# Patient Record
Sex: Female | Born: 1960
Health system: Southern US, Community
[De-identification: ages and names within clinical notes are randomized; demographics above are authoritative.]

## PROBLEM LIST (undated history)

## (undated) DIAGNOSIS — F419 Anxiety disorder, unspecified: Secondary | ICD-10-CM

## (undated) DIAGNOSIS — D649 Anemia, unspecified: Secondary | ICD-10-CM

## (undated) DIAGNOSIS — G479 Sleep disorder, unspecified: Secondary | ICD-10-CM

## (undated) DIAGNOSIS — J449 Chronic obstructive pulmonary disease, unspecified: Secondary | ICD-10-CM

## (undated) DIAGNOSIS — C50412 Malignant neoplasm of upper-outer quadrant of left female breast: Secondary | ICD-10-CM

## (undated) DIAGNOSIS — C801 Malignant (primary) neoplasm, unspecified: Secondary | ICD-10-CM

## (undated) DIAGNOSIS — F32A Depression, unspecified: Secondary | ICD-10-CM

## (undated) DIAGNOSIS — Z87440 Personal history of urinary (tract) infections: Secondary | ICD-10-CM

## (undated) DIAGNOSIS — Z720 Tobacco use: Secondary | ICD-10-CM

## (undated) DIAGNOSIS — J40 Bronchitis, not specified as acute or chronic: Secondary | ICD-10-CM

## (undated) DIAGNOSIS — J45909 Unspecified asthma, uncomplicated: Secondary | ICD-10-CM

## (undated) DIAGNOSIS — M069 Rheumatoid arthritis, unspecified: Secondary | ICD-10-CM

## (undated) DIAGNOSIS — Z170 Estrogen receptor positive status [ER+]: Secondary | ICD-10-CM

## (undated) DIAGNOSIS — F329 Major depressive disorder, single episode, unspecified: Secondary | ICD-10-CM

## (undated) HISTORY — DX: Estrogen receptor positive status (ER+): Z17.0

## (undated) HISTORY — PX: MULTIPLE TOOTH EXTRACTIONS: SHX2053

## (undated) HISTORY — PX: JOINT REPLACEMENT: SHX530

## (undated) HISTORY — PX: HIP ARTHROPLASTY: SHX981

## (undated) HISTORY — DX: Malignant neoplasm of upper-outer quadrant of left female breast: C50.412

## (undated) HISTORY — PX: WISDOM TOOTH EXTRACTION: SHX21

---

## 2001-02-04 ENCOUNTER — Emergency Department (HOSPITAL_COMMUNITY): Admission: EM | Admit: 2001-02-04 | Discharge: 2001-02-04 | Payer: Self-pay | Admitting: Emergency Medicine

## 2011-05-01 ENCOUNTER — Other Ambulatory Visit (HOSPITAL_COMMUNITY): Payer: Self-pay | Admitting: Orthopedic Surgery

## 2011-05-01 DIAGNOSIS — M25561 Pain in right knee: Secondary | ICD-10-CM

## 2011-05-07 ENCOUNTER — Other Ambulatory Visit (HOSPITAL_COMMUNITY): Payer: Self-pay

## 2011-05-13 ENCOUNTER — Inpatient Hospital Stay (HOSPITAL_COMMUNITY): Admission: RE | Admit: 2011-05-13 | Payer: Self-pay | Source: Ambulatory Visit

## 2018-01-13 ENCOUNTER — Other Ambulatory Visit (INDEPENDENT_AMBULATORY_CARE_PROVIDER_SITE_OTHER): Payer: Self-pay | Admitting: Orthopedic Surgery

## 2018-01-14 ENCOUNTER — Other Ambulatory Visit: Payer: Self-pay | Admitting: Orthopedic Surgery

## 2018-01-15 ENCOUNTER — Encounter: Payer: Self-pay | Admitting: Internal Medicine

## 2018-01-15 ENCOUNTER — Ambulatory Visit: Payer: Medicaid Other | Attending: Internal Medicine | Admitting: Internal Medicine

## 2018-01-15 VITALS — BP 112/72 | HR 76 | Temp 97.9°F | Resp 16 | Ht 65.5 in | Wt 103.2 lb

## 2018-01-15 DIAGNOSIS — Z1239 Encounter for other screening for malignant neoplasm of breast: Secondary | ICD-10-CM

## 2018-01-15 DIAGNOSIS — Z1159 Encounter for screening for other viral diseases: Secondary | ICD-10-CM | POA: Diagnosis not present

## 2018-01-15 DIAGNOSIS — Z1331 Encounter for screening for depression: Secondary | ICD-10-CM | POA: Diagnosis not present

## 2018-01-15 DIAGNOSIS — Z803 Family history of malignant neoplasm of breast: Secondary | ICD-10-CM | POA: Diagnosis not present

## 2018-01-15 DIAGNOSIS — F17200 Nicotine dependence, unspecified, uncomplicated: Secondary | ICD-10-CM | POA: Insufficient documentation

## 2018-01-15 DIAGNOSIS — Z7952 Long term (current) use of systemic steroids: Secondary | ICD-10-CM | POA: Diagnosis not present

## 2018-01-15 DIAGNOSIS — M069 Rheumatoid arthritis, unspecified: Secondary | ICD-10-CM | POA: Diagnosis present

## 2018-01-15 DIAGNOSIS — Z23 Encounter for immunization: Secondary | ICD-10-CM | POA: Insufficient documentation

## 2018-01-15 DIAGNOSIS — Z5900 Homelessness unspecified: Secondary | ICD-10-CM | POA: Insufficient documentation

## 2018-01-15 DIAGNOSIS — Z79899 Other long term (current) drug therapy: Secondary | ICD-10-CM | POA: Diagnosis not present

## 2018-01-15 DIAGNOSIS — Z114 Encounter for screening for human immunodeficiency virus [HIV]: Secondary | ICD-10-CM | POA: Insufficient documentation

## 2018-01-15 DIAGNOSIS — F17210 Nicotine dependence, cigarettes, uncomplicated: Secondary | ICD-10-CM | POA: Insufficient documentation

## 2018-01-15 DIAGNOSIS — M1612 Unilateral primary osteoarthritis, left hip: Secondary | ICD-10-CM | POA: Diagnosis not present

## 2018-01-15 DIAGNOSIS — Z79891 Long term (current) use of opiate analgesic: Secondary | ICD-10-CM | POA: Insufficient documentation

## 2018-01-15 MED ORDER — NICOTINE POLACRILEX 2 MG MT GUM: 2.0000 mg | CHEWING_GUM | OROMUCOSAL | 1 refills | Status: DC | PRN

## 2018-01-15 MED ORDER — ALBUTEROL SULFATE HFA 108 (90 BASE) MCG/ACT IN AERS: 2.0000 | INHALATION_SPRAY | Freq: Four times a day (QID) | RESPIRATORY_TRACT | 2 refills | Status: DC | PRN

## 2018-01-15 MED ORDER — TRAMADOL HCL 50 MG PO TABS: 50.0000 mg | ORAL_TABLET | Freq: Two times a day (BID) | ORAL | 0 refills | Status: DC | PRN

## 2018-01-15 NOTE — Patient Instructions (Addendum)
Please contact the [facility] to schedule mammogram at [phone]   Influenza Virus Vaccine injection (Fluarix) What is this medicine? INFLUENZA VIRUS VACCINE (in floo EN zuh VAHY ruhs vak SEEN) helps to reduce the risk of getting influenza also known as the flu. This medicine may be used for other purposes; ask your health care provider or pharmacist if you have questions. COMMON BRAND NAME(S): Fluarix, Fluzone What should I tell my health care provider before I take this medicine? They need to know if you have any of these conditions: -bleeding disorder like hemophilia -fever or infection -Guillain-Barre syndrome or other neurological problems -immune system problems -infection with the human immunodeficiency virus (HIV) or AIDS -low blood platelet counts -multiple sclerosis -an unusual or allergic reaction to influenza virus vaccine, eggs, chicken proteins, latex, gentamicin, other medicines, foods, dyes or preservatives -pregnant or trying to get pregnant -breast-feeding How should I use this medicine? This vaccine is for injection into a muscle. It is given by a health care professional. A copy of Vaccine Information Statements will be given before each vaccination. Read this sheet carefully each time. The sheet may change frequently. Talk to your pediatrician regarding the use of this medicine in children. Special care may be needed. Overdosage: If you think you have taken too much of this medicine contact a poison control center or emergency room at once. NOTE: This medicine is only for you. Do not share this medicine with others. What if I miss a dose? This does not apply. What may interact with this medicine? -chemotherapy or radiation therapy -medicines that lower your immune system like etanercept, anakinra, infliximab, and adalimumab -medicines that treat or prevent blood clots like warfarin -phenytoin -steroid medicines like prednisone or  cortisone -theophylline -vaccines This list may not describe all possible interactions. Give your health care provider a list of all the medicines, herbs, non-prescription drugs, or dietary supplements you use. Also tell them if you smoke, drink alcohol, or use illegal drugs. Some items may interact with your medicine. What should I watch for while using this medicine? Report any side effects that do not go away within 3 days to your doctor or health care professional. Call your health care provider if any unusual symptoms occur within 6 weeks of receiving this vaccine. You may still catch the flu, but the illness is not usually as bad. You cannot get the flu from the vaccine. The vaccine will not protect against colds or other illnesses that may cause fever. The vaccine is needed every year. What side effects may I notice from receiving this medicine? Side effects that you should report to your doctor or health care professional as soon as possible: -allergic reactions like skin rash, itching or hives, swelling of the face, lips, or tongue Side effects that usually do not require medical attention (report to your doctor or health care professional if they continue or are bothersome): -fever -headache -muscle aches and pains -pain, tenderness, redness, or swelling at site where injected -weak or tired This list may not describe all possible side effects. Call your doctor for medical advice about side effects. You may report side effects to FDA at 1-800-FDA-1088. Where should I keep my medicine? This vaccine is only given in a clinic, pharmacy, doctor's office, or other health care setting and will not be stored at home. NOTE: This sheet is a summary. It may not cover all possible information. If you have questions about this medicine, talk to your doctor, pharmacist, or  health care provider.  2018 Elsevier/Gold Standard (2007-09-22 09:30:40)

## 2018-01-15 NOTE — Progress Notes (Signed)
Patient ID: [name], female    DOB: 1961/02/15  MRN: [id number]  CC: New Patient (Initial Visit)   Subjective: [name] is a 57 y.o. female who presents for new patient visit. Her concerns today include:   Previous PCP was [name] at the [facility] [employer]).  Just received Medicaid and was assigned to this practice.  Pt gives hx of RA dx in 2017 through [facility].  She was seen there one time.  Since then her PCP at [facility] had been writing the prescriptions for methotrexate and prednisone.  She is on prednisone 1 mg daily.  She is on methotrexate with a dose that alternates from week to week but she does not recall her current dose.   Reports most of the pain is in her hands, left hip and right shoulder Requests referral to rheumatology.  Complains of significant pain in right hip.  She was seen by orthopedics [facility] sometime last month.  They in turn referred her to Dr. Mayer Camel with [facility].  Patient states she was told that she will need hip replacement and she is ready to move forward now that she has Medicaid.  She plans to schedule a follow-up appointment with him as soon as possible.  She was given a limited prescription for tramadol 50 mg #15 tabs which she has been using sparingly.  She is requesting a prescription for it.  She just started working temporarily at [employer]  4 days a wk and finds that her hip hurts even more.   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    Patient has positive depression screen and positive gad 7 today.  She attributes her depression to what is currently going on with her health.  She feels that things will get better once she has had her hip surgery.  She denies any homicidal or suicidal ideation.  She is not interested in being placed on any medication.  Smoke for long time. Trying to  quit. -smoked intermittently for 40 yrs.  Was able to quit for 4-5 yrs at one time. Highest amount was 1/2 pk a day.   -smokes more when she is depressed -now 1 pk last 2 wks.  Never tried anything to quit. Would like to try nicotine gum  HM:  Last MMG and pap were 3 yrs ago  Social history, family history and surgical history reviewed. 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  She denies any other street drug use at this time. She is homeless but currently staying with a friend.  Current Outpatient Medications on File Prior to Visit  Medication Sig Dispense Refill  . ibuprofen (ADVIL,MOTRIN) 800 MG tablet Take 800 mg by mouth every 8 (eight) hours as needed.    . methotrexate (RHEUMATREX) 2.5 MG tablet Take 2.5 mg by mouth. Caution:Chemotherapy. Protect from light.    . predniSONE (DELTASONE) 1 MG tablet Take 1 mg by mouth daily with breakfast.     No current facility-administered medications on file prior to visit.     Allergies no known allergies  Social History   Socioeconomic History  . Marital status: Single    Spouse name: Not on file  . Number of children: 1  . Years of education: Not on file  . Highest education level:  Not on file  Occupational History  . Not on file  Social Needs  . Financial resource strain: Not on file  . Food insecurity:    Worry: Not on file    Inability: Not on file  . Transportation needs:    Medical: Not on file    Non-medical: Not on file  Tobacco Use  . Smoking status: Current Every Day Smoker  . Smokeless tobacco: Never Used  Substance and Sexual Activity  . Alcohol use: Yes    Comment: occasionally  . Drug use: Not on file    Comment: last used in 2004  . Sexual activity: Not on file  Lifestyle  . Physical activity:    Days per week: Not on file    Minutes per session: Not on file  . Stress: Not on file  Relationships  . Social connections:    Talks on phone: Not on file    Gets  together: Not on file    Attends religious service: Not on file    Active member of club or organization: Not on file    Attends meetings of clubs or organizations: Not on file    Relationship status: Not on file  . Intimate partner violence:    Fear of current or ex partner: Not on file    Emotionally abused: Not on file    Physically abused: Not on file    Forced sexual activity: Not on file  Other Topics Concern  . Not on file  Social History Narrative  . Not on file    Family History  Problem Relation Age of Onset  . Breast cancer Mother      ROS: Review of Systems As stated above PHYSICAL EXAM: BP 112/72   Pulse 76   Temp 97.9 F (36.6 C) (Oral)   Resp 16   Ht 5' 5.5" (1.664 m)   Wt 103 lb 3.2 oz (46.8 kg)   SpO2 95%   BMI 16.91 kg/m   Physical Exam  General appearance - alert, well appearing, middle-aged Caucasian female and in no distress Mental status - normal mood, behavior, speech, dress, motor activity, and thought processes Eyes - pupils equal and reactive, extraocular eye movements intact Neck - supple, no significant adenopathy Chest - clear to auscultation, no wheezes, rales or rhonchi, symmetric air entry Heart - normal rate, regular rhythm, normal S1, S2, no murmurs, rubs, clicks or gallops Musculoskeletal -patient has one crutch with her and walks with it and of the left arm.  She has ulnar deviation of the fingers with enlargement of the MCP joints especially the first MCP joint on both hands Extremities -no lower extremity edema. Depression screen PHQ 2/9 01/15/2018  Decreased Interest 2  Down, Depressed, Hopeless 2  PHQ - 2 Score 4  Altered sleeping 3  Tired, decreased energy 3  Change in appetite 1  Feeling bad or failure about yourself  1  Trouble concentrating 3  Moving slowly or fidgety/restless 2  Suicidal thoughts 0  PHQ-9 Score 17   GAD 7 : Generalized Anxiety Score 01/15/2018  Nervous, Anxious, on Edge 2  Control/stop worrying 2    Worry too much - different things 2  Trouble relaxing 2  Restless 2  Easily annoyed or irritable 1  Afraid - awful might happen 1  Total GAD 7 Score 12     ASSESSMENT AND PLAN: 1. Rheumatoid arthritis involving multiple sites, unspecified rheumatoid factor presence ([place]) Advised patient to call me back with the  dose of methotrexate that she is currently taking. - CBC - Comprehensive metabolic panel - Lipid panel - Ambulatory referral to Rheumatology - Rheumatoid factor  2. Tobacco dependence Patient advised to quit smoking. Pt ready to give trail of quitting.  She is wanting to try the nicotine gum.  Prescription given. - nicotine polacrilex (NICORETTE) 2 MG gum; Take 1 each (2 mg total) by mouth as needed for smoking cessation. Max of no more than 30 pieces/24 hr  Dispense: 100 tablet; Refill: 1  3. Arthritis of left hip Keep follow-up appointment with [facility]. I am agreeable to putting her on tramadol to use as needed.  However, patient advised that I will need to get her records from her previous PCP and also records from [facility] her diagnoses. -Patient advised that tramadol is a controlled substance and as such we will need to do a controlled substance prescribing agreement.  I had my CMA go over that with her.  We will get a baseline urine drug screen today. NCCSRS reviewed and is appropriate.  4. Breast cancer screening - MM Digital Screening; Future  5. Need for hepatitis C screening test Patient agreeable to hepatitis C screening - Hepatitis C Antibody  6. Screening for HIV (human immunodeficiency virus) Patient agreeable to HIV screening - HIV Antibody (routine testing w rflx)  7. Need for influenza vaccination   8. Opioid use agreement exists - [id number] 11+Oxyco+Alc+Crt-Bund  9. Positive depression screening Patient does not feel that she needs to be on any medication at this time.  She also declines speaking with our LCSW or  accessing behavioral health services.  10. Homeless   Patient was given the opportunity to ask questions.  Patient verbalized understanding of the plan and was able to repeat key elements of the plan.   Orders Placed This Encounter  Procedures  . MM Digital Screening  . CBC  . Comprehensive metabolic panel  . Lipid panel  . HIV Antibody (routine testing w rflx)  . Hepatitis C Antibody  . [id number] 11+Oxyco+Alc+Crt-Bund  . Rheumatoid factor  . Ambulatory referral to Rheumatology     Requested Prescriptions   Signed Prescriptions Disp Refills  . nicotine polacrilex (NICORETTE) 2 MG gum 100 tablet 1    Sig: Take 1 each (2 mg total) by mouth as needed for smoking cessation. Max of no more than 30 pieces/24 hr  . traMADol (ULTRAM) 50 MG tablet 40 tablet 0    Sig: Take 1 tablet (50 mg total) by mouth 2 (two) times daily as needed. Prescription to last 1 month  . albuterol (PROVENTIL HFA;VENTOLIN HFA) 108 (90 Base) MCG/ACT inhaler 1 Inhaler 2    Sig: Inhale 2 puffs into the lungs every 6 (six) hours as needed for wheezing or shortness of breath.    Karle Plumber, MD, FACP

## 2018-01-15 NOTE — Progress Notes (Signed)
Pt states she has rheumatoid arthritis   Pt states she is taking Methotrexate

## 2018-01-16 LAB — CBC
Hematocrit: 38.3 % (ref 34.0–46.6)
Hemoglobin: 12.8 g/dL (ref 11.1–15.9)
MCH: 28.8 pg (ref 26.6–33.0)
MCHC: 33.4 g/dL (ref 31.5–35.7)
MCV: 86 fL (ref 79–97)
Platelets: 311 10*3/uL (ref 150–450)
RBC: 4.44 x10E6/uL (ref 3.77–5.28)
RDW: 14.2 % (ref 12.3–15.4)
WBC: 7 10*3/uL (ref 3.4–10.8)

## 2018-01-16 LAB — COMPREHENSIVE METABOLIC PANEL
ALK PHOS: 87 IU/L (ref 39–117)
ALT: 12 IU/L (ref 0–32)
AST: 12 IU/L (ref 0–40)
Albumin/Globulin Ratio: 1.7 (ref 1.2–2.2)
Albumin: 4.3 g/dL (ref 3.5–5.5)
BUN/Creatinine Ratio: 17 (ref 9–23)
BUN: 16 mg/dL (ref 6–24)
Bilirubin Total: 0.3 mg/dL (ref 0.0–1.2)
CO2: 26 mmol/L (ref 20–29)
CREATININE: 0.95 mg/dL (ref 0.57–1.00)
Calcium: 10.1 mg/dL (ref 8.7–10.2)
Chloride: 102 mmol/L (ref 96–106)
GFR, EST AFRICAN AMERICAN: 76 mL/min/{1.73_m2} (ref >59)
GFR, EST NON AFRICAN AMERICAN: 66 mL/min/{1.73_m2} (ref >59)
GLOBULIN, TOTAL: 2.5 g/dL (ref 1.5–4.5)
GLUCOSE: 81 mg/dL (ref 65–99)
POTASSIUM: 4.5 mmol/L (ref 3.5–5.2)
Sodium: 142 mmol/L (ref 134–144)
Total Protein: 6.8 g/dL (ref 6.0–8.5)

## 2018-01-16 LAB — LIPID PANEL
Chol/HDL Ratio: 2.1 ratio (ref 0.0–4.4)
Cholesterol, Total: 165 mg/dL (ref 100–199)
HDL: 77 mg/dL (ref >39)
LDL CALC: 67 mg/dL (ref 0–99)
TRIGLYCERIDES: 103 mg/dL (ref 0–149)
VLDL CHOLESTEROL CAL: 21 mg/dL (ref 5–40)

## 2018-01-16 LAB — RHEUMATOID FACTOR: Rheumatoid fact SerPl-aCnc: 102.1 IU/mL — ABNORMAL HIGH (ref 0.0–13.9)

## 2018-01-16 LAB — HEPATITIS C ANTIBODY: Hep C Virus Ab: <0.1 s/co ratio (ref 0.0–0.9)

## 2018-01-16 LAB — HIV ANTIBODY (ROUTINE TESTING W REFLEX): HIV Screen 4th Generation wRfx: NONREACTIVE

## 2018-01-20 LAB — DRUG SCREEN 764883 11+OXYCO+ALC+CRT-BUND
AMPHETAMINES, URINE: NEGATIVE ng/mL
BARBITURATE: NEGATIVE ng/mL
BENZODIAZ UR QL: NEGATIVE ng/mL
Cannabinoid Quant, Ur: NEGATIVE ng/mL
Cocaine (Metabolite): NEGATIVE ng/mL
Creatinine: 154 mg/dL (ref 20.0–300.0)
Ethanol: NEGATIVE %
Meperidine: NEGATIVE ng/mL
Methadone Screen, Urine: NEGATIVE ng/mL
OPIATE SCREEN URINE: NEGATIVE ng/mL
Oxycodone/Oxymorphone, Urine: NEGATIVE ng/mL
PH OF URINE: 5.8 (ref 4.5–8.9)
PHENCYCLIDINE: NEGATIVE ng/mL
Propoxyphene: NEGATIVE ng/mL

## 2018-01-20 LAB — TRAMADOL GC/MS, URINE
TRAMADOL GC/MS CONF: 2200 ng/mL
Tramadol: POSITIVE — AB

## 2018-01-26 ENCOUNTER — Telehealth: Payer: Self-pay | Admitting: Internal Medicine

## 2018-01-26 NOTE — Telephone Encounter (Signed)
Will forward to pcp

## 2018-01-26 NOTE — Telephone Encounter (Signed)
Patient called requesting   methotrexate (RHEUMATREX) 2.5 MG tablet  . Pt can't see the rheumatologist yet until she get previous notes .  Thank You

## 2018-01-27 NOTE — Telephone Encounter (Signed)
Contacted pt to get information for Dr. Wynetta Emery pt didn't answer lvm asking pt to give me a call with this information

## 2018-01-28 MED ORDER — METHOTREXATE 2.5 MG PO TABS: ORAL_TABLET | ORAL | 1 refills | Status: DC

## 2018-01-28 NOTE — Telephone Encounter (Signed)
Contacted [facility] and spoke to [place]  Pt is taking Methotrexate 2.5mg  tablet 6 tabs every Saturday. Per Loletha Grayer it was last filled on halloween

## 2018-01-28 NOTE — Telephone Encounter (Signed)
Patient called to give information however, says she does not have this information and would like for Nurse to contact [facility] to obtain prescription information at [phone]  Prescription placed by mary from the [facility] Prescription name: Methatriade

## 2018-01-28 NOTE — Telephone Encounter (Signed)
Tried contacting again to get information for Dr. Wynetta Emery pt didn't answer lvm asking pt to give me a call with this information

## 2018-02-08 ENCOUNTER — Other Ambulatory Visit: Payer: Self-pay

## 2018-02-08 ENCOUNTER — Encounter (HOSPITAL_COMMUNITY)
Admission: RE | Admit: 2018-02-08 | Discharge: 2018-02-08 | Disposition: A | Discharge status: Home / Self Care | Payer: Medicaid Other | Source: Ambulatory Visit | Attending: Orthopedic Surgery | Admitting: Orthopedic Surgery

## 2018-02-08 ENCOUNTER — Ambulatory Visit (HOSPITAL_COMMUNITY)
Admission: RE | Admit: 2018-02-08 | Discharge: 2018-02-08 | Disposition: A | Discharge status: Home / Self Care | Payer: Medicaid Other | Source: Ambulatory Visit | Attending: Orthopedic Surgery | Admitting: Orthopedic Surgery

## 2018-02-08 ENCOUNTER — Encounter (HOSPITAL_COMMUNITY): Payer: Self-pay

## 2018-02-08 DIAGNOSIS — Z01818 Encounter for other preprocedural examination: Secondary | ICD-10-CM | POA: Insufficient documentation

## 2018-02-08 HISTORY — DX: Personal history of urinary (tract) infections: Z87.440

## 2018-02-08 HISTORY — DX: Tobacco use: Z72.0

## 2018-02-08 HISTORY — DX: Depression, unspecified: F32.A

## 2018-02-08 HISTORY — DX: Rheumatoid arthritis, unspecified: M06.9

## 2018-02-08 HISTORY — DX: Anxiety disorder, unspecified: F41.9

## 2018-02-08 HISTORY — DX: Major depressive disorder, single episode, unspecified: F32.9

## 2018-02-08 HISTORY — DX: Sleep disorder, unspecified: G47.9

## 2018-02-08 HISTORY — DX: Bronchitis, not specified as acute or chronic: J40

## 2018-02-08 LAB — BASIC METABOLIC PANEL
Anion gap: 6 (ref 5–15)
BUN: 23 mg/dL — ABNORMAL HIGH (ref 6–20)
CO2: 27 mmol/L (ref 22–32)
Calcium: 9.6 mg/dL (ref 8.9–10.3)
Chloride: 107 mmol/L (ref 98–111)
Creatinine, Ser: 0.77 mg/dL (ref 0.44–1.00)
GFR calc Af Amer: >60 mL/min (ref >60)
GFR calc non Af Amer: >60 mL/min (ref >60)
Glucose, Bld: 93 mg/dL (ref 70–99)
Potassium: 5.7 mmol/L — ABNORMAL HIGH (ref 3.5–5.1)
Sodium: 140 mmol/L (ref 135–145)

## 2018-02-08 LAB — URINALYSIS, ROUTINE W REFLEX MICROSCOPIC
Bilirubin Urine: NEGATIVE
Glucose, UA: NEGATIVE mg/dL
Hgb urine dipstick: NEGATIVE
Ketones, ur: NEGATIVE mg/dL
Leukocytes, UA: NEGATIVE
Nitrite: NEGATIVE
Protein, ur: NEGATIVE mg/dL
SPECIFIC GRAVITY, URINE: 1.019 (ref 1.005–1.030)
pH: 5 (ref 5.0–8.0)

## 2018-02-08 LAB — CBC WITH DIFFERENTIAL/PLATELET
Abs Immature Granulocytes: 0.04 10*3/uL (ref 0.00–0.07)
Basophils Absolute: 0 10*3/uL (ref 0.0–0.1)
Basophils Relative: 1 %
Eosinophils Absolute: 0.1 10*3/uL (ref 0.0–0.5)
Eosinophils Relative: 2 %
HCT: 44.5 % (ref 36.0–46.0)
Hemoglobin: 14.1 g/dL (ref 12.0–15.0)
Immature Granulocytes: 1 %
Lymphocytes Relative: 30 %
Lymphs Abs: 1.9 10*3/uL (ref 0.7–4.0)
MCH: 29 pg (ref 26.0–34.0)
MCHC: 31.7 g/dL (ref 30.0–36.0)
MCV: 91.6 fL (ref 80.0–100.0)
Monocytes Absolute: 0.4 10*3/uL (ref 0.1–1.0)
Monocytes Relative: 6 %
Neutro Abs: 3.9 10*3/uL (ref 1.7–7.7)
Neutrophils Relative %: 60 %
Platelets: 233 10*3/uL (ref 150–400)
RBC: 4.86 MIL/uL (ref 3.87–5.11)
RDW: 14.6 % (ref 11.5–15.5)
WBC: 6.4 10*3/uL (ref 4.0–10.5)
nRBC: 0 % (ref 0.0–0.2)

## 2018-02-08 LAB — PROTIME-INR
INR: 0.95
Prothrombin Time: 12.6 seconds (ref 11.4–15.2)

## 2018-02-08 LAB — ABO/RH: ABO/RH(D): O POS

## 2018-02-08 LAB — APTT: aPTT: 41 seconds — ABNORMAL HIGH (ref 24–36)

## 2018-02-08 NOTE — Patient Instructions (Signed)
[name]  02/08/2018   Your procedure is scheduled on: 02-15-18   Report to [facility] Main  Entrance    Report to admitting at 10:15AM    Call this number if you have problems the morning of surgery [phone]    Remember: Do not eat food or drink liquids :After Midnight. BRUSH YOUR TEETH MORNING OF SURGERY AND RINSE YOUR MOUTH OUT, NO CHEWING GUM CANDY OR MINTS.     Take these medicines the morning of surgery with A SIP OF WATER: prednisone, albuterol inhaler                                 You may not have any metal on your body including hair pins and              piercings  Do not wear jewelry, make-up, lotions, powders or perfumes, deodorant             Do not wear nail polish.  Do not shave  48 hours prior to surgery.     Do not bring valuables to the hospital. [name].  Contacts, dentures or bridgework may not be worn into surgery.  Leave suitcase in the car. After surgery it may be brought to your room.                  Please read over the following fact sheets you were given: _____________________________________________________________________             [facility] - Preparing for Surgery Before surgery, you can play an important role.  Because skin is not sterile, your skin needs to be as free of germs as possible.  You can reduce the number of germs on your skin by washing with CHG (chlorahexidine gluconate) soap before surgery.  CHG is an antiseptic cleaner which kills germs and bonds with the skin to continue killing germs even after washing. Please DO NOT use if you have an allergy to CHG or antibacterial soaps.  If your skin becomes reddened/irritated stop using the CHG and inform your nurse when you arrive at Short Stay. Do not shave (including legs and underarms) for at least 48 hours prior to the first CHG shower.  You may shave your face/neck. Please follow these  instructions carefully:  1.  Shower with CHG Soap the night before surgery and the  morning of Surgery.  2.  If you choose to wash your hair, wash your hair first as usual with your  normal  shampoo.  3.  After you shampoo, rinse your hair and body thoroughly to remove the  shampoo.                           4.  Use CHG as you would any other liquid soap.  You can apply chg directly  to the skin and wash                       Gently with a scrungie or clean washcloth.  5.  Apply the CHG Soap to your body ONLY FROM THE NECK DOWN.   Do not use on face/ open  Wound or open sores. Avoid contact with eyes, ears mouth and genitals (private parts).                       Wash face,  Genitals (private parts) with your normal soap.             6.  Wash thoroughly, paying special attention to the area where your surgery  will be performed.  7.  Thoroughly rinse your body with warm water from the neck down.  8.  DO NOT shower/wash with your normal soap after using and rinsing off  the CHG Soap.                9.  Pat yourself dry with a clean towel.            10.  Wear clean pajamas.            11.  Place clean sheets on your bed the night of your first shower and do not  sleep with pets. Day of Surgery : Do not apply any lotions/deodorants the morning of surgery.  Please wear clean clothes to the hospital/surgery center.  FAILURE TO FOLLOW THESE INSTRUCTIONS MAY RESULT IN THE CANCELLATION OF YOUR SURGERY PATIENT SIGNATURE_________________________________  NURSE SIGNATURE__________________________________  ________________________________________________________________________   [name]  An incentive spirometer is a tool that can help keep your lungs clear and active. This tool measures how well you are filling your lungs with each breath. Taking long deep breaths may help reverse or decrease the chance of developing breathing (pulmonary) problems (especially  infection) following:  A long period of time when you are unable to move or be active. BEFORE THE PROCEDURE   If the spirometer includes an indicator to show your best effort, your nurse or respiratory therapist will set it to a desired goal.  If possible, sit up straight or lean slightly forward. Try not to slouch.  Hold the incentive spirometer in an upright position. INSTRUCTIONS FOR USE  1. Sit on the edge of your bed if possible, or sit up as far as you can in bed or on a chair. 2. Hold the incentive spirometer in an upright position. 3. Breathe out normally. 4. Place the mouthpiece in your mouth and seal your lips tightly around it. 5. Breathe in slowly and as deeply as possible, raising the piston or the ball toward the top of the column. 6. Hold your breath for 3-5 seconds or for as long as possible. Allow the piston or ball to fall to the bottom of the column. 7. Remove the mouthpiece from your mouth and breathe out normally. 8. Rest for a few seconds and repeat Steps 1 through 7 at least 10 times every 1-2 hours when you are awake. Take your time and take a few normal breaths between deep breaths. 9. The spirometer may include an indicator to show your best effort. Use the indicator as a goal to work toward during each repetition. 10. After each set of 10 deep breaths, practice coughing to be sure your lungs are clear. If you have an incision (the cut made at the time of surgery), support your incision when coughing by placing a pillow or rolled up towels firmly against it. Once you are able to get out of bed, walk around indoors and cough well. You may stop using the incentive spirometer when instructed by your caregiver.  RISKS AND COMPLICATIONS  Take your time so you do not get  dizzy or light-headed.  If you are in pain, you may need to take or ask for pain medication before doing incentive spirometry. It is harder to take a deep breath if you are having pain. AFTER  USE  Rest and breathe slowly and easily.  It can be helpful to keep track of a log of your progress. Your caregiver can provide you with a simple table to help with this. If you are using the spirometer at home, follow these instructions: [name] IF:   You are having difficultly using the spirometer.  You have trouble using the spirometer as often as instructed.  Your pain medication is not giving enough relief while using the spirometer.  You develop fever of 100.5 F (38.1 C) or higher. SEEK IMMEDIATE MEDICAL CARE IF:   You cough up bloody sputum that had not been present before.  You develop fever of 102 F (38.9 C) or greater.  You develop worsening pain at or near the incision site. MAKE SURE YOU:   Understand these instructions.  Will watch your condition.  Will get help right away if you are not doing well or get worse. Document Released: 07/07/2006 Document Revised: 05/19/2011 Document Reviewed: 09/07/2006 [facility] Patient Information 2014 [place], [place].   ________________________________________________________________________

## 2018-02-09 LAB — SURGICAL PCR SCREEN
MRSA, PCR: POSITIVE — AB
Staphylococcus aureus: POSITIVE — AB

## 2018-02-11 DIAGNOSIS — M247 Protrusio acetabuli: Secondary | ICD-10-CM | POA: Diagnosis present

## 2018-02-11 NOTE — H&P (Signed)
TOTAL HIP ADMISSION H&P  Patient is admitted for left total hip arthroplasty.  Subjective:  Chief Complaint: left hip pain  HPI: [name], 57 y.o. female, has a history of pain and functional disability in the left hip(s) due to arthritis and protrusio and patient has failed non-surgical conservative treatments for greater than 12 weeks to include NSAID's and/or analgesics, use of assistive devices and activity modification.  Onset of symptoms was abrupt starting 1 years ago with rapidlly worsening course since that time.The patient noted no past surgery on the left hip(s).  Patient currently rates pain in the left hip at 10 out of 10 with activity. Patient has night pain, worsening of pain with activity and weight bearing, trendelenberg gait, pain that interfers with activities of daily living and pain with passive range of motion. Patient has evidence of subchondral cysts, joint space narrowing and protrusio by imaging studies. This condition presents safety issues increasing the risk of falls.   There is no current active infection.  Patient Active Problem List   Diagnosis Date Noted  . Rheumatoid arthritis involving multiple sites ([place]) 01/15/2018  . Tobacco dependence 01/15/2018  . Arthritis of left hip 01/15/2018  . Opioid use agreement exists 01/15/2018  . Positive depression screening 01/15/2018  . Homeless 01/15/2018   Past Medical History:  Diagnosis Date  . Anxiety   . Bronchitis age 47  . Depression   . Hx of bladder infections    been over 5 years since last one   . Rheumatoid arthritis ([place])    unc rheum   . Tobacco abuse   . Trouble in sleeping     Past Surgical History:  Procedure Laterality Date  . WISDOM TOOTH EXTRACTION      No current facility-administered medications for this encounter.    Current Outpatient Medications  Medication Sig Dispense Refill Last Dose  . albuterol (PROVENTIL HFA;VENTOLIN HFA) 108 (90 Base) MCG/ACT inhaler Inhale 2 puffs into  the lungs every 6 (six) hours as needed for wheezing or shortness of breath. 1 Inhaler 2   . Cholecalciferol (VITAMIN D3) 75 MCG (3000 UT) TABS Take 3,000 Units by mouth daily.     Marland Kitchen FOLIC ACID PO Take 1 mg by mouth daily.      Marland Kitchen ibuprofen (ADVIL,MOTRIN) 800 MG tablet Take 800 mg by mouth every 8 (eight) hours as needed for moderate pain.      . methotrexate (RHEUMATREX) 2.5 MG tablet Take 6 tabs PO once a wk (Patient taking differently: Take 15 mg by mouth once a week. Take 6 tabs PO once a wk) 24 tablet 1   . Multiple Vitamin (MULTIVITAMIN WITH MINERALS) TABS tablet Take 1 tablet by mouth daily.     . predniSONE (DELTASONE) 1 MG tablet Take 1 mg by mouth daily with breakfast.     . traMADol (ULTRAM) 50 MG tablet Take 1 tablet (50 mg total) by mouth 2 (two) times daily as needed. Prescription to last 1 month 40 tablet 0   . nicotine polacrilex (NICORETTE) 2 MG gum Take 1 each (2 mg total) by mouth as needed for smoking cessation. Max of no more than 30 pieces/24 hr (Patient not taking: Reported on 01/29/2018) 100 tablet 1 Not Taking at Unknown time   No Known Allergies  Social History   Tobacco Use  . Smoking status: Current Some Day Smoker    Types: Cigarettes  . Smokeless tobacco: Never Used  Substance Use Topics  . Alcohol use: Yes  Comment: occasionally    Family History  Problem Relation Age of Onset  . Breast cancer Mother      Review of Systems  Constitutional: Negative.   HENT: Negative.   Eyes: Positive for blurred vision.  Respiratory: Negative.   Cardiovascular: Negative.   Gastrointestinal: Negative.   Genitourinary: Negative.   Musculoskeletal: Positive for joint pain.  Skin: Negative.   Neurological: Negative.   Endo/Heme/Allergies: Negative.   Psychiatric/Behavioral: Positive for depression.    Objective:  Physical Exam  Constitutional: She is oriented to person, place, and time. She appears well-developed and well-nourished.  HENT:  Head: Normocephalic  and atraumatic.  Eyes: Pupils are equal, round, and reactive to light.  Neck: Normal range of motion. Neck supple.  Cardiovascular: Intact distal pulses.  Respiratory: Effort normal.  Musculoskeletal: She exhibits tenderness.  Patient walks with a profound left-sided limp.  Any attempts at internal or external rotation of the left hip causes severe pain.  The left hip is about a centimeter shorter than the right hip to measurement.  She is neurovascular intact distally.  Rotation is limited to 30.  External and about 5 internal  Neurological: She is alert and oriented to person, place, and time.  Skin: Skin is warm and dry.  Psychiatric: She has a normal mood and affect. Her behavior is normal. Judgment and thought content normal.    Vital signs in last 24 hours:    Labs:   Estimated body mass index is 16.91 kg/m as calculated from the following:   Height as of 01/15/18: 5' 5.5" (1.664 m).   Weight as of 01/15/18: 46.8 kg.   Imaging Review Plain radiographs demonstrate that the femoral head his migrated into the pelvis about 25%.  It is bone-on-bone.  There is impressive subchondral cystic changes.      Preoperative templating of the joint replacement has been completed, documented, and submitted to the Operating Room personnel in order to optimize intra-operative equipment management.     Assessment/Plan:  End stage arthritis, left hip(s)  The patient history, physical examination, clinical judgement of the provider and imaging studies are consistent with end stage degenerative joint disease of the left hip(s) and total hip arthroplasty is deemed medically necessary. The treatment options including medical management, injection therapy, arthroscopy and arthroplasty were discussed at length. The risks and benefits of total hip arthroplasty were presented and reviewed. The risks due to aseptic loosening, infection, stiffness, dislocation/subluxation,  thromboembolic complications  and other imponderables were discussed.  The patient acknowledged the explanation, agreed to proceed with the plan and consent was signed. Patient is being admitted for inpatient treatment for surgery, pain control, PT, OT, prophylactic antibiotics, VTE prophylaxis, progressive ambulation and ADL's and discharge planning.The patient is planning to be discharged home with home health services

## 2018-02-12 ENCOUNTER — Telehealth: Payer: Self-pay | Admitting: Internal Medicine

## 2018-02-12 NOTE — Telephone Encounter (Signed)
Will forward to pcp

## 2018-02-12 NOTE — Telephone Encounter (Signed)
Patient called to request a medication change on -nicotine polacrilex (NICORETTE) 2 MG gum [[id number]]  To an inhaler or a nasal spray, because her insurance will not cover Gum and they are too expensive.

## 2018-02-13 MED ORDER — NICOTINE 10 MG IN INHA: 1.0000 | RESPIRATORY_TRACT | 3 refills | Status: DC | PRN

## 2018-02-14 MED ORDER — BUPIVACAINE LIPOSOME 1.3 % IJ SUSP
10.0000 mL | INTRAMUSCULAR | Status: DC
  Filled 2018-02-14: qty 20

## 2018-02-14 MED ORDER — TRANEXAMIC ACID 1000 MG/10ML IV SOLN
2000.0000 mg | INTRAVENOUS | Status: DC
  Filled 2018-02-14: qty 20

## 2018-02-15 ENCOUNTER — Encounter (HOSPITAL_COMMUNITY): Payer: Self-pay | Admitting: *Deleted

## 2018-02-15 ENCOUNTER — Encounter (HOSPITAL_COMMUNITY)
Admission: RE | Disposition: A | Discharge status: Home Health | Payer: Self-pay | Source: Ambulatory Visit | Attending: Orthopedic Surgery

## 2018-02-15 ENCOUNTER — Inpatient Hospital Stay (HOSPITAL_COMMUNITY): Payer: Medicaid Other

## 2018-02-15 ENCOUNTER — Other Ambulatory Visit: Payer: Self-pay

## 2018-02-15 ENCOUNTER — Inpatient Hospital Stay (HOSPITAL_COMMUNITY)
Admission: RE | Admit: 2018-02-15 | Discharge: 2018-02-17 | DRG: 470 | Disposition: A | Discharge status: Home Health | Payer: Medicaid Other | Source: Ambulatory Visit | Attending: Orthopedic Surgery | Admitting: Orthopedic Surgery

## 2018-02-15 ENCOUNTER — Inpatient Hospital Stay (HOSPITAL_COMMUNITY): Payer: Medicaid Other | Admitting: Anesthesiology

## 2018-02-15 DIAGNOSIS — M247 Protrusio acetabuli: Secondary | ICD-10-CM | POA: Diagnosis present

## 2018-02-15 DIAGNOSIS — F329 Major depressive disorder, single episode, unspecified: Secondary | ICD-10-CM | POA: Diagnosis present

## 2018-02-15 DIAGNOSIS — F17210 Nicotine dependence, cigarettes, uncomplicated: Secondary | ICD-10-CM | POA: Diagnosis present

## 2018-02-15 DIAGNOSIS — Z419 Encounter for procedure for purposes other than remedying health state, unspecified: Secondary | ICD-10-CM

## 2018-02-15 DIAGNOSIS — D62 Acute posthemorrhagic anemia: Secondary | ICD-10-CM | POA: Diagnosis not present

## 2018-02-15 DIAGNOSIS — Z79899 Other long term (current) drug therapy: Secondary | ICD-10-CM | POA: Diagnosis not present

## 2018-02-15 DIAGNOSIS — M1612 Unilateral primary osteoarthritis, left hip: Secondary | ICD-10-CM | POA: Diagnosis present

## 2018-02-15 DIAGNOSIS — M069 Rheumatoid arthritis, unspecified: Secondary | ICD-10-CM | POA: Diagnosis present

## 2018-02-15 HISTORY — PX: TOTAL HIP ARTHROPLASTY: SHX124

## 2018-02-15 LAB — TYPE AND SCREEN
ABO/RH(D): O POS
Antibody Screen: NEGATIVE

## 2018-02-15 SURGERY — ARTHROPLASTY, HIP, TOTAL, ANTERIOR APPROACH
Anesthesia: Spinal | Site: Hip | Laterality: Left

## 2018-02-15 MED ORDER — KCL IN DEXTROSE-NACL 20-5-0.45 MEQ/L-%-% IV SOLN
INTRAVENOUS | Status: DC
  Administered 2018-02-15 – 2018-02-16 (×2): via INTRAVENOUS
  Filled 2018-02-15 (×4): qty 1000

## 2018-02-15 MED ORDER — DIPHENHYDRAMINE HCL 12.5 MG/5ML PO ELIX
12.5000 mg | ORAL_SOLUTION | ORAL | Status: DC | PRN
  Administered 2018-02-16: 25 mg via ORAL
  Filled 2018-02-15: qty 10

## 2018-02-15 MED ORDER — FENTANYL CITRATE (PF) 100 MCG/2ML IJ SOLN
INTRAMUSCULAR | Status: AC
  Filled 2018-02-15: qty 2

## 2018-02-15 MED ORDER — BUPIVACAINE-EPINEPHRINE (PF) 0.5% -1:200000 IJ SOLN
INTRAMUSCULAR | Status: DC | PRN
  Administered 2018-02-15: 30 mL

## 2018-02-15 MED ORDER — NICOTINE POLACRILEX 2 MG MT GUM
2.0000 mg | CHEWING_GUM | OROMUCOSAL | Status: DC | PRN
  Administered 2018-02-16 – 2018-02-17 (×3): 2 mg via ORAL
  Filled 2018-02-15 (×5): qty 1

## 2018-02-15 MED ORDER — CELECOXIB 200 MG PO CAPS
200.0000 mg | ORAL_CAPSULE | Freq: Two times a day (BID) | ORAL | Status: DC
  Administered 2018-02-15 – 2018-02-17 (×5): 200 mg via ORAL
  Filled 2018-02-15 (×5): qty 1

## 2018-02-15 MED ORDER — ACETAMINOPHEN 500 MG PO TABS
1000.0000 mg | ORAL_TABLET | Freq: Four times a day (QID) | ORAL | Status: AC
  Administered 2018-02-15 – 2018-02-16 (×4): 1000 mg via ORAL
  Filled 2018-02-15 (×4): qty 2

## 2018-02-15 MED ORDER — MENTHOL 3 MG MT LOZG: 1.0000 | LOZENGE | OROMUCOSAL | Status: DC | PRN

## 2018-02-15 MED ORDER — PROPOFOL 10 MG/ML IV BOLUS
INTRAVENOUS | Status: AC
  Filled 2018-02-15: qty 40

## 2018-02-15 MED ORDER — TRANEXAMIC ACID-NACL 1000-0.7 MG/100ML-% IV SOLN: 1000.0000 mg | INTRAVENOUS | Status: DC

## 2018-02-15 MED ORDER — ALUM & MAG HYDROXIDE-SIMETH 200-200-20 MG/5ML PO SUSP: 15.0000 mL | ORAL | Status: DC | PRN

## 2018-02-15 MED ORDER — TRANEXAMIC ACID 1000 MG/10ML IV SOLN
INTRAVENOUS | Status: DC | PRN
  Administered 2018-02-15: 2000 mg via TOPICAL

## 2018-02-15 MED ORDER — TIZANIDINE HCL 2 MG PO TABS: 2.0000 mg | ORAL_TABLET | Freq: Four times a day (QID) | ORAL | 0 refills | Status: DC | PRN

## 2018-02-15 MED ORDER — VITAMIN D 25 MCG (1000 UNIT) PO TABS
3000.0000 [IU] | ORAL_TABLET | Freq: Every day | ORAL | Status: DC
  Administered 2018-02-16 – 2018-02-17 (×2): 3000 [IU] via ORAL
  Filled 2018-02-15 (×2): qty 3

## 2018-02-15 MED ORDER — GABAPENTIN 300 MG PO CAPS
300.0000 mg | ORAL_CAPSULE | Freq: Three times a day (TID) | ORAL | Status: DC
  Administered 2018-02-15 – 2018-02-17 (×6): 300 mg via ORAL
  Filled 2018-02-15 (×6): qty 1

## 2018-02-15 MED ORDER — TRAMADOL HCL 50 MG PO TABS
50.0000 mg | ORAL_TABLET | Freq: Two times a day (BID) | ORAL | Status: DC | PRN
  Filled 2018-02-15: qty 1

## 2018-02-15 MED ORDER — ONDANSETRON HCL 4 MG/2ML IJ SOLN: 4.0000 mg | Freq: Once | INTRAMUSCULAR | Status: DC | PRN

## 2018-02-15 MED ORDER — ASPIRIN 81 MG PO CHEW
81.0000 mg | CHEWABLE_TABLET | Freq: Two times a day (BID) | ORAL | Status: DC
  Administered 2018-02-15 – 2018-02-17 (×4): 81 mg via ORAL
  Filled 2018-02-15 (×4): qty 1

## 2018-02-15 MED ORDER — FOLIC ACID 1 MG PO TABS
1.0000 mg | ORAL_TABLET | Freq: Every day | ORAL | Status: DC
  Administered 2018-02-16 – 2018-02-17 (×2): 1 mg via ORAL
  Filled 2018-02-15 (×2): qty 1

## 2018-02-15 MED ORDER — METHOCARBAMOL 500 MG IVPB - SIMPLE MED
500.0000 mg | Freq: Four times a day (QID) | INTRAVENOUS | Status: DC | PRN
  Administered 2018-02-15: 500 mg via INTRAVENOUS
  Filled 2018-02-15: qty 50

## 2018-02-15 MED ORDER — BISACODYL 5 MG PO TBEC: 5.0000 mg | DELAYED_RELEASE_TABLET | Freq: Every day | ORAL | Status: DC | PRN

## 2018-02-15 MED ORDER — EPHEDRINE 5 MG/ML INJ
INTRAVENOUS | Status: AC
  Filled 2018-02-15: qty 10

## 2018-02-15 MED ORDER — ONDANSETRON HCL 4 MG PO TABS: 4.0000 mg | ORAL_TABLET | Freq: Four times a day (QID) | ORAL | Status: DC | PRN

## 2018-02-15 MED ORDER — FENTANYL CITRATE (PF) 100 MCG/2ML IJ SOLN
INTRAMUSCULAR | Status: DC | PRN
  Administered 2018-02-15: 100 ug via INTRAVENOUS

## 2018-02-15 MED ORDER — PROPOFOL 10 MG/ML IV BOLUS
INTRAVENOUS | Status: AC
  Filled 2018-02-15: qty 20

## 2018-02-15 MED ORDER — ONDANSETRON HCL 4 MG/2ML IJ SOLN
INTRAMUSCULAR | Status: DC | PRN
  Administered 2018-02-15: 4 mg via INTRAVENOUS

## 2018-02-15 MED ORDER — BUPIVACAINE IN DEXTROSE 0.75-8.25 % IT SOLN
INTRATHECAL | Status: DC | PRN
  Administered 2018-02-15: 1.6 mL via INTRATHECAL

## 2018-02-15 MED ORDER — FENTANYL CITRATE (PF) 100 MCG/2ML IJ SOLN
25.0000 ug | INTRAMUSCULAR | Status: DC | PRN
  Administered 2018-02-15 (×2): 50 ug via INTRAVENOUS

## 2018-02-15 MED ORDER — ADULT MULTIVITAMIN W/MINERALS CH
1.0000 | ORAL_TABLET | Freq: Every day | ORAL | Status: DC
  Administered 2018-02-16 – 2018-02-17 (×2): 1 via ORAL
  Filled 2018-02-15 (×2): qty 1

## 2018-02-15 MED ORDER — METHOCARBAMOL 500 MG PO TABS
500.0000 mg | ORAL_TABLET | Freq: Four times a day (QID) | ORAL | Status: DC | PRN
  Administered 2018-02-16 – 2018-02-17 (×5): 500 mg via ORAL
  Filled 2018-02-15 (×5): qty 1

## 2018-02-15 MED ORDER — DEXAMETHASONE SODIUM PHOSPHATE 10 MG/ML IJ SOLN
INTRAMUSCULAR | Status: AC
  Filled 2018-02-15: qty 1

## 2018-02-15 MED ORDER — FLEET ENEMA 7-19 GM/118ML RE ENEM: 1.0000 | ENEMA | Freq: Once | RECTAL | Status: DC | PRN

## 2018-02-15 MED ORDER — TRANEXAMIC ACID-NACL 1000-0.7 MG/100ML-% IV SOLN
1000.0000 mg | Freq: Once | INTRAVENOUS | Status: AC
  Administered 2018-02-15: 1000 mg via INTRAVENOUS
  Filled 2018-02-15: qty 100

## 2018-02-15 MED ORDER — DEXAMETHASONE SODIUM PHOSPHATE 10 MG/ML IJ SOLN
10.0000 mg | Freq: Once | INTRAMUSCULAR | Status: AC
  Administered 2018-02-16: 10 mg via INTRAVENOUS
  Filled 2018-02-15: qty 1

## 2018-02-15 MED ORDER — DEXAMETHASONE SODIUM PHOSPHATE 10 MG/ML IJ SOLN
INTRAMUSCULAR | Status: DC | PRN
  Administered 2018-02-15: 10 mg via INTRAVENOUS

## 2018-02-15 MED ORDER — CEFAZOLIN SODIUM-DEXTROSE 2-4 GM/100ML-% IV SOLN
2.0000 g | INTRAVENOUS | Status: AC
  Administered 2018-02-15: 2 g via INTRAVENOUS
  Filled 2018-02-15: qty 100

## 2018-02-15 MED ORDER — PREDNISONE 1 MG PO TABS
1.0000 mg | ORAL_TABLET | Freq: Every day | ORAL | Status: DC
  Administered 2018-02-16 – 2018-02-17 (×2): 1 mg via ORAL
  Filled 2018-02-15 (×2): qty 1

## 2018-02-15 MED ORDER — EPHEDRINE SULFATE-NACL 50-0.9 MG/10ML-% IV SOSY
PREFILLED_SYRINGE | INTRAVENOUS | Status: DC | PRN
  Administered 2018-02-15: 5 mg via INTRAVENOUS

## 2018-02-15 MED ORDER — LIDOCAINE 2% (20 MG/ML) 5 ML SYRINGE
INTRAMUSCULAR | Status: DC | PRN
  Administered 2018-02-15: 100 mg via INTRAVENOUS

## 2018-02-15 MED ORDER — MIDAZOLAM HCL 2 MG/2ML IJ SOLN
INTRAMUSCULAR | Status: AC
  Filled 2018-02-15: qty 2

## 2018-02-15 MED ORDER — ALBUTEROL SULFATE (2.5 MG/3ML) 0.083% IN NEBU: 2.5000 mg | INHALATION_SOLUTION | Freq: Four times a day (QID) | RESPIRATORY_TRACT | Status: DC | PRN

## 2018-02-15 MED ORDER — PHENOL 1.4 % MT LIQD: 1.0000 | OROMUCOSAL | Status: DC | PRN

## 2018-02-15 MED ORDER — OXYCODONE-ACETAMINOPHEN 5-325 MG PO TABS: 1.0000 | ORAL_TABLET | ORAL | 0 refills | Status: DC | PRN

## 2018-02-15 MED ORDER — SODIUM CHLORIDE 0.9% FLUSH
INTRAVENOUS | Status: DC | PRN
  Administered 2018-02-15: 50 mL

## 2018-02-15 MED ORDER — LACTATED RINGERS IV SOLN
INTRAVENOUS | Status: DC
  Administered 2018-02-15 (×2): via INTRAVENOUS

## 2018-02-15 MED ORDER — METOCLOPRAMIDE HCL 5 MG/ML IJ SOLN: 5.0000 mg | Freq: Three times a day (TID) | INTRAMUSCULAR | Status: DC | PRN

## 2018-02-15 MED ORDER — VANCOMYCIN HCL IN DEXTROSE 1-5 GM/200ML-% IV SOLN
1000.0000 mg | INTRAVENOUS | Status: AC
  Administered 2018-02-15: 1000 mg via INTRAVENOUS
  Filled 2018-02-15: qty 200

## 2018-02-15 MED ORDER — PROPOFOL 10 MG/ML IV BOLUS
INTRAVENOUS | Status: DC | PRN
  Administered 2018-02-15 (×5): 20 mg via INTRAVENOUS

## 2018-02-15 MED ORDER — ALBUTEROL SULFATE HFA 108 (90 BASE) MCG/ACT IN AERS: 2.0000 | INHALATION_SPRAY | Freq: Four times a day (QID) | RESPIRATORY_TRACT | Status: DC | PRN

## 2018-02-15 MED ORDER — OXYCODONE HCL 5 MG PO TABS
5.0000 mg | ORAL_TABLET | ORAL | Status: DC | PRN
  Administered 2018-02-15 – 2018-02-17 (×7): 10 mg via ORAL
  Filled 2018-02-15 (×7): qty 2

## 2018-02-15 MED ORDER — MIDAZOLAM HCL 5 MG/5ML IJ SOLN
INTRAMUSCULAR | Status: DC | PRN
  Administered 2018-02-15 (×2): 1 mg via INTRAVENOUS

## 2018-02-15 MED ORDER — BUPIVACAINE LIPOSOME 1.3 % IJ SUSP
INTRAMUSCULAR | Status: DC | PRN
  Administered 2018-02-15: 20 mL

## 2018-02-15 MED ORDER — SODIUM CHLORIDE (PF) 0.9 % IJ SOLN
INTRAMUSCULAR | Status: AC
  Filled 2018-02-15: qty 50

## 2018-02-15 MED ORDER — ASPIRIN EC 81 MG PO TBEC: 81.0000 mg | DELAYED_RELEASE_TABLET | Freq: Two times a day (BID) | ORAL | 0 refills | Status: DC

## 2018-02-15 MED ORDER — POLYETHYLENE GLYCOL 3350 17 G PO PACK
17.0000 g | PACK | Freq: Every day | ORAL | Status: DC | PRN
  Administered 2018-02-17: 17 g via ORAL
  Filled 2018-02-15: qty 1

## 2018-02-15 MED ORDER — PROPOFOL 500 MG/50ML IV EMUL
INTRAVENOUS | Status: DC | PRN
  Administered 2018-02-15: 75 ug/kg/min via INTRAVENOUS

## 2018-02-15 MED ORDER — PANTOPRAZOLE SODIUM 40 MG PO TBEC
40.0000 mg | DELAYED_RELEASE_TABLET | Freq: Every day | ORAL | Status: DC
  Administered 2018-02-15 – 2018-02-17 (×3): 40 mg via ORAL
  Filled 2018-02-15 (×3): qty 1

## 2018-02-15 MED ORDER — ACETAMINOPHEN 325 MG PO TABS: 325.0000 mg | ORAL_TABLET | Freq: Four times a day (QID) | ORAL | Status: DC | PRN

## 2018-02-15 MED ORDER — METHOCARBAMOL 500 MG IVPB - SIMPLE MED
INTRAVENOUS | Status: AC
  Filled 2018-02-15: qty 50

## 2018-02-15 MED ORDER — ALUMINUM HYDROXIDE GEL 320 MG/5ML PO SUSP: 15.0000 mL | ORAL | Status: DC | PRN

## 2018-02-15 MED ORDER — ONDANSETRON HCL 4 MG/2ML IJ SOLN
INTRAMUSCULAR | Status: AC
  Filled 2018-02-15: qty 2

## 2018-02-15 MED ORDER — 0.9 % SODIUM CHLORIDE (POUR BTL) OPTIME
TOPICAL | Status: DC | PRN
  Administered 2018-02-15: 1000 mL

## 2018-02-15 MED ORDER — DOCUSATE SODIUM 100 MG PO CAPS
100.0000 mg | ORAL_CAPSULE | Freq: Two times a day (BID) | ORAL | Status: DC
  Administered 2018-02-15 – 2018-02-17 (×4): 100 mg via ORAL
  Filled 2018-02-15 (×4): qty 1

## 2018-02-15 MED ORDER — BUPIVACAINE-EPINEPHRINE (PF) 0.25% -1:200000 IJ SOLN
INTRAMUSCULAR | Status: AC
  Filled 2018-02-15: qty 30

## 2018-02-15 MED ORDER — TRANEXAMIC ACID-NACL 1000-0.7 MG/100ML-% IV SOLN
1000.0000 mg | INTRAVENOUS | Status: AC
  Administered 2018-02-15: 1000 mg via INTRAVENOUS
  Filled 2018-02-15 (×2): qty 100

## 2018-02-15 MED ORDER — LIDOCAINE 2% (20 MG/ML) 5 ML SYRINGE
INTRAMUSCULAR | Status: AC
  Filled 2018-02-15: qty 5

## 2018-02-15 MED ORDER — HYDROMORPHONE HCL 1 MG/ML IJ SOLN
0.5000 mg | INTRAMUSCULAR | Status: DC | PRN
  Administered 2018-02-15: 1 mg via INTRAVENOUS
  Filled 2018-02-15: qty 1

## 2018-02-15 MED ORDER — BUPIVACAINE LIPOSOME 1.3 % IJ SUSP: 20.0000 mL | Freq: Once | INTRAMUSCULAR | Status: DC

## 2018-02-15 MED ORDER — CHLORHEXIDINE GLUCONATE 4 % EX LIQD: 60.0000 mL | Freq: Once | CUTANEOUS | Status: DC

## 2018-02-15 MED ORDER — ONDANSETRON HCL 4 MG/2ML IJ SOLN: 4.0000 mg | Freq: Four times a day (QID) | INTRAMUSCULAR | Status: DC | PRN

## 2018-02-15 MED ORDER — METOCLOPRAMIDE HCL 5 MG PO TABS: 5.0000 mg | ORAL_TABLET | Freq: Three times a day (TID) | ORAL | Status: DC | PRN

## 2018-02-15 MED ORDER — NICOTINE 10 MG IN INHA: 1.0000 | RESPIRATORY_TRACT | Status: DC | PRN

## 2018-02-15 SURGICAL SUPPLY — 44 items
BAG DECANTER FOR FLEXI CONT (MISCELLANEOUS) ×3 IMPLANT
BLADE SAW SGTL 18X1.27X75 (BLADE) ×2 IMPLANT
BLADE SAW SGTL 18X1.27X75MM (BLADE) ×1
BLADE SURG SZ10 CARB STEEL (BLADE) ×6 IMPLANT
COVER PERINEAL POST (MISCELLANEOUS) ×3 IMPLANT
COVER SURGICAL LIGHT HANDLE (MISCELLANEOUS) ×3 IMPLANT
COVER WAND RF STERILE (DRAPES) ×3 IMPLANT
CUP GRIPTON 48MM 100 HIP (Hips) ×2 IMPLANT
DECANTER SPIKE VIAL GLASS SM (MISCELLANEOUS) ×6 IMPLANT
DRAPE STERI IOBAN 125X83 (DRAPES) ×3 IMPLANT
DRAPE U-SHAPE 47X51 STRL (DRAPES) ×6 IMPLANT
DRESSING AQUACEL AG SP 3.5X10 (GAUZE/BANDAGES/DRESSINGS) IMPLANT
DRSG AQUACEL AG ADV 3.5X10 (GAUZE/BANDAGES/DRESSINGS) ×3 IMPLANT
DRSG AQUACEL AG SP 3.5X10 (GAUZE/BANDAGES/DRESSINGS) ×3
DURAPREP 26ML APPLICATOR (WOUND CARE) ×3 IMPLANT
ELECT REM PT RETURN 15FT ADLT (MISCELLANEOUS) ×3 IMPLANT
ELIMINATOR HOLE APEX DEPUY (Hips) ×2 IMPLANT
GLOVE BIO SURGEON STRL SZ7.5 (GLOVE) ×3 IMPLANT
GLOVE BIO SURGEON STRL SZ8.5 (GLOVE) ×3 IMPLANT
GLOVE BIOGEL PI IND STRL 8 (GLOVE) ×1 IMPLANT
GLOVE BIOGEL PI IND STRL 9 (GLOVE) ×1 IMPLANT
GLOVE BIOGEL PI INDICATOR 8 (GLOVE) ×2
GLOVE BIOGEL PI INDICATOR 9 (GLOVE) ×2
GOWN STRL REUS W/TWL XL LVL3 (GOWN DISPOSABLE) ×6 IMPLANT
HEAD FEMORAL 32 CERAMIC (Hips) ×2 IMPLANT
MANIFOLD NEPTUNE II (INSTRUMENTS) ×3 IMPLANT
NDL HYPO 21X1.5 SAFETY (NEEDLE) ×2 IMPLANT
NEEDLE HYPO 21X1.5 SAFETY (NEEDLE) ×6 IMPLANT
NS IRRIG 1000ML POUR BTL (IV SOLUTION) ×3 IMPLANT
PACK ANTERIOR HIP CUSTOM (KITS) ×3 IMPLANT
PINN ALTRX NEUT ID X OD 32X48 ×2 IMPLANT
STEM FEMORAL SZ 5MM STD ACTIS (Stem) ×2 IMPLANT
SUT ETHIBOND NAB CT1 #1 30IN (SUTURE) ×3 IMPLANT
SUT VIC AB 0 CT1 27 (SUTURE) ×3
SUT VIC AB 0 CT1 27XBRD ANBCTR (SUTURE) ×1 IMPLANT
SUT VIC AB 1 CTX 36 (SUTURE) ×3
SUT VIC AB 1 CTX36XBRD ANBCTR (SUTURE) ×1 IMPLANT
SUT VIC AB 2-0 CT1 27 (SUTURE) ×3
SUT VIC AB 2-0 CT1 TAPERPNT 27 (SUTURE) ×1 IMPLANT
SUT VIC AB 3-0 CT1 27 (SUTURE) ×3
SUT VIC AB 3-0 CT1 TAPERPNT 27 (SUTURE) ×1 IMPLANT
SYR CONTROL 10ML LL (SYRINGE) ×9 IMPLANT
TRAY FOLEY MTR SLVR 16FR STAT (SET/KITS/TRAYS/PACK) IMPLANT
YANKAUER SUCT BULB TIP 10FT TU (MISCELLANEOUS) ×3 IMPLANT

## 2018-02-15 NOTE — Evaluation (Signed)
Physical Therapy Evaluation Patient Details Name: [name] MRN: [id number] DOB: [birth year] Today's Date: 02/15/2018   History of Present Illness  57 yo female s/p L DA-THA on 02/15/18. PMH includes RA, tobacco use, homelessness, anxiety, bronchitis, depression.   Clinical Impression   Pt presents with moderate L hip pain, difficulty performing mobility tasks, and decreased tolerance for ambulation due to hip pain. Pt to benefit from acute PT to address deficits. Pt ambulated 33 ft with RW with min guard assist. Pt educated on quad sets (5-10/hour), ankle pumps (20/hour), and heel slides (5-10/hour) to perform this afternoon/evening to lessen stiffness and increase circulation, to pt's tolerance and limited by pain. Pt states case manager to work with pt for SNF placement. PT to progress mobility as tolerated, and will continue to follow acutely.      Follow Up Recommendations Follow surgeon's recommendation for DC plan and follow-up therapies;Supervision for mobility/OOB    Equipment Recommendations  Rolling walker with 5" wheels    Recommendations for Other Services       Precautions / Restrictions Precautions Precautions: Fall Restrictions Weight Bearing Restrictions: No Other Position/Activity Restrictions: WBAT       Mobility  Bed Mobility Overal bed mobility: Needs Assistance Bed Mobility: Supine to Sit     Supine to sit: Min assist;HOB elevated     General bed mobility comments: Min assist for sequencing and scooting to EOB. Pt with mild nausea upon sitting EOB, improved with ginger ale and prolonged sitting at EOB.   Transfers Overall transfer level: Needs assistance Equipment used: Rolling walker (2 wheeled) Transfers: Sit to/from Stand Sit to Stand: Min guard;From elevated surface         General transfer comment: Min guard for safety. Verbal cuing for hand placement x2.   Ambulation/Gait Ambulation/Gait assistance: Min guard Gait Distance (Feet): 90  Feet Assistive device: Rolling walker (2 wheeled) Gait Pattern/deviations: Step-to pattern;Decreased stance time - left;Decreased weight shift to left;Antalgic Gait velocity: decr    General Gait Details: Min guard for safety. Verbal cuing for sequencing. Pt with appropriate placement in RW.  Stairs            Wheelchair Mobility    Modified Rankin (Stroke Patients Only)       Balance Overall balance assessment: Mild deficits observed, not formally tested                                           Pertinent Vitals/Pain Pain Assessment: 0-10 Pain Score: 7  Pain Location: L hip  Pain Descriptors / Indicators: Sore Pain Intervention(s): Limited activity within patient's tolerance;Repositioned;Monitored during session;Premedicated before session;Ice applied    Home Living Family/patient expects to be discharged to:: Skilled nursing facility Living Arrangements: (Unsure of prior living arrangements - Per chart review, pt with homelessness)                    Prior Function Level of Independence: Independent with assistive device(s)         Comments: used a cane or a crutch for ambulation PTA, preferred the crutch due to shoulder pain.      Hand Dominance   Dominant Hand: Right    Extremity/Trunk Assessment   Upper Extremity Assessment Upper Extremity Assessment: Overall WFL for tasks assessed    Lower Extremity Assessment Lower Extremity Assessment: Overall WFL for tasks assessed;LLE deficits/detail LLE Deficits / Details:  suspected post-surgical weakness; able to perform SLR during bed mobility, quad sets, ankle pumps  LLE Sensation: WNL    Cervical / Trunk Assessment Cervical / Trunk Assessment: Normal  Communication   Communication: No difficulties  Cognition Arousal/Alertness: Awake/alert Behavior During Therapy: WFL for tasks assessed/performed Overall Cognitive Status: Within Functional Limits for tasks assessed                                         General Comments      Exercises     Assessment/Plan    PT Assessment Patient needs continued PT services  PT Problem List Decreased strength;Decreased range of motion;Pain;Decreased activity tolerance;Decreased knowledge of use of DME;Decreased balance;Decreased mobility       PT Treatment Interventions DME instruction;Therapeutic activities;Gait training;Therapeutic exercise;Patient/family education;Balance training;Functional mobility training    PT Goals (Current goals can be found in the Care Plan section)  Acute Rehab PT Goals Patient Stated Goal: walk without pain, increase walking distance PT Goal Formulation: With patient Time For Goal Achievement: 02/22/18 Potential to Achieve Goals: Good    Frequency 7X/week   Barriers to discharge        Co-evaluation               AM-PAC PT "6 Clicks" Mobility  Outcome Measure Help needed turning from your back to your side while in a flat bed without using bedrails?: A Little Help needed moving from lying on your back to sitting on the side of a flat bed without using bedrails?: A Little Help needed moving to and from a bed to a chair (including a wheelchair)?: A Little Help needed standing up from a chair using your arms (e.g., wheelchair or bedside chair)?: A Little Help needed to walk in hospital room?: A Little Help needed climbing 3-5 steps with a railing? : A Little 6 Click Score: 18    End of Session Equipment Utilized During Treatment: Gait belt Activity Tolerance: Patient tolerated treatment well Patient left: in chair;with chair alarm set;with call bell/phone within reach;with family/visitor present;with SCD's reapplied Nurse Communication: Mobility status PT Visit Diagnosis: Other abnormalities of gait and mobility (R26.89);Difficulty in walking, not elsewhere classified (R26.2)    Time: 1730-1753 PT Time Calculation (min) (ACUTE ONLY): 23 min   Charges:    PT Evaluation $PT Eval Low Complexity: 1 Low          [name], PT Acute Rehabilitation Services Pager [phone]  Office [phone]   [name] D [name] 02/15/2018, 6:26 PM

## 2018-02-15 NOTE — Discharge Instructions (Signed)

## 2018-02-15 NOTE — Op Note (Signed)
OPERATIVE REPORT    DATE OF PROCEDURE:  02/15/2018       PREOPERATIVE DIAGNOSIS:  LEFT HIP OSTEOARTHRITIS PROTRUSIO                                                          POSTOPERATIVE DIAGNOSIS:  LEFT HIP OSTEOARTHRITIS PROTRUSIO                                                           PROCEDURE: Anterior L total hip arthroplasty using a 48 mm DePuy Gryption Cup, [employer], 0-degree polyethylene liner, a +1 mm x 73mm ceramic head, a 5 std Depuy Actis stem   SURGEON: Kerin Salen    ASSISTANT:   Kerry Hough. [employer]  (present throughout entire procedure and necessary for timely completion of the procedure)   ANESTHESIA: Spinal BLOOD LOSS: 300 cc FLUID REPLACEMENT: 1500 cc crystalloid Antibiotic: 2gm ancef, 1gm vancomycin Tranexamic Acid: 1gm IV, 2gm Topical Exparel: 266mg  COMPLICATIONS: none    INDICATIONS FOR PROCEDURE: A 57 y.o. year-old With  LEFT HIP OSTEOARTHRITIS PROTRUSIO   for 5 years, x-rays show bone-on-bone arthritic changes, and osteophytes. Despite conservative measures with observation, anti-inflammatory medicine, narcotics, use of a cane, has severe unremitting pain and can ambulate only a few blocks before resting. Patient desires elective L total hip arthroplasty to decrease pain and increase function. The risks, benefits, and alternatives were discussed at length including but not limited to the risks of infection, bleeding, nerve injury, stiffness, blood clots, the need for revision surgery, cardiopulmonary complications, among others, and they were willing to proceed. Questions answered      PROCEDURE IN DETAIL: The patient was identified by armband,   received preoperative IV antibiotics in the holding area at [facility], taken to the operating room , appropriate anesthetic monitors   were attached and  anesthesia was induced with the patient on the gurney. The HANA boots were applied to the feet and the patient  was transferred to the  HANA table with a peroneal post and support underneath the non-operative leg, which was locked in 2 lb traction. Theoperative lower extremity was then prepped and draped in the usual sterile fashion from just above the iliac crest to the knee. And a timeout procedure was performed. We then made a 12 cm incision along the interval at the leading edge of the tensor fascia lata of starting at 2 cm lateral to the ASIS. Small bleeders in the skin and subcutaneous tissue identified and cauterized we dissected down to the fascia and made an incision in the fascia allowing [place] to elevate the fascia of the tensor muscle and exploited the interval between the rectus and the tensor fascia lata. A Cobra retractor was then placed along the superior neck of the femur. A cerebellar retractor was used to expose the interval between the tensor fascia lata and the rectus femoris. .  We identified and cauterized the ascending branch of the anterior circumflex artery. A second Cobra retractor along the inferior neck of the femur. A small Hohmann retractor was placed underneath the origin of the  rectus femoris, giving [place] good medial exposure. Using Ronguers fatty tissue was removed from in front of the anterior capsule. The capsule was then incised, starting out at the superior anterior aspect of the acetabulum going laterally along the anterior neck. The capsule was then teed along the neck superiorly and inferiorly. Electrocautery was used to release capsule from the anterior and medial neck of the femur to allow external rotation. Cobra retractors were then placed along the inferior and superior neck allowing [place] to perform a standard neck cut and removed the femoral head with a power corkscrew. We then placed a medium bent homan retractor in the cotyloid notch and posteriorly along the acetabular rim a narrow Cobra retractor. Exposed labral tissue was then removed with the electrocautery. We then sequentially reamed up to a 48 mm  basket reamer obtaining good coverage in all quadrants, verified by C-arm imaging. Under C-arm control we then hammered into place a 48 mm Pinnacle cup in 45 of abduction and 15 of anteversion. The cup seated nicely and required no supplemental screws. We then placed a central hole Eliminator and a 0 polyethylene liner. The foot was then externally rotated to 130-140. The limb was extended and adducted delivering the proximal femur up into the wound. A medium curved Hohmann retractor was placed over the greater trochanter and a long Homan retractor along the posterior femoral neck completing the exposure. We then performed releases superiorly and and inferiorly of the capsule going back to the pirformis fossa superiorly and to the lesser trochanter inferiorly. We then entered the proximal femur with the box cutting offset chisel followed by, a canal sounder, the chili pepper and broaching up to a 5 broach. This seated nicely and we reamed the calcar. A trial reduction was performed with a 1 mm 32 mm head.The limb lengths were excellent the hip was stable in 90 of external rotation. At this point the trial components removed and we hammered into place a # 5 std  Offset Actis stem with Gryption coating. A +1 mm x 43 ceramic head was then hammered into place. The hip was reduced and final C-arm images obtained. The wound was thoroughly irrigated with normal saline solution. We repaired the ant capsule and the tensor fascia lot a with running 0 vicryl suture. the subcutaneous tissue was closed with 2-0 and 3-0 Vicryl suture followed by an Aquacil dressing. At this point the patient was awaken and transferred to hospital gurney without difficulty.   Kerin Salen 02/15/2018, 1:40 PM

## 2018-02-15 NOTE — Anesthesia Procedure Notes (Signed)
Spinal  Patient location during procedure: OR Start time: 02/15/2018 12:10 PM End time: 02/15/2018 12:13 PM Staffing Anesthesiologist: Catalina Gravel, MD Performed: anesthesiologist  Preanesthetic Checklist Completed: patient identified, surgical consent, pre-op evaluation, timeout performed, IV checked, risks and benefits discussed and monitors and equipment checked Spinal Block Patient position: sitting Prep: site prepped and draped and DuraPrep Patient monitoring: continuous pulse ox and blood pressure Approach: midline Location: L3-4 Injection technique: single-shot Needle Needle type: Pencan  Needle gauge: 24 G Assessment Sensory level: T8 Additional Notes Functioning IV was confirmed and monitors were applied. Sterile prep and drape, including hand hygiene, mask and sterile gloves were used. The patient was positioned and the spine was prepped. The skin was anesthetized with lidocaine.  Free flow of clear CSF was obtained prior to injecting local anesthetic into the CSF.  The spinal needle aspirated freely following injection.  The needle was carefully withdrawn.  The patient tolerated the procedure well. Consent was obtained prior to procedure with all questions answered and concerns addressed. Risks including but not limited to bleeding, infection, nerve damage, paralysis, failed block, inadequate analgesia, allergic reaction, high spinal, itching and headache were discussed and the patient wished to proceed.   Hoy Morn, MD

## 2018-02-15 NOTE — Transfer of Care (Signed)
Immediate Anesthesia Transfer of Care Note  Patient: [name]  Procedure(s) Performed: TOTAL HIP ARTHROPLASTY ANTERIOR APPROACH (Left Hip)  Patient Location: PACU  Anesthesia Type:Spinal  Level of Consciousness: awake and alert   Airway & Oxygen Therapy: Patient Spontanous Breathing and Patient connected to nasal cannula oxygen  Post-op Assessment: Report given to RN and Post -op Vital signs reviewed and stable  Post vital signs: Reviewed and stable  Last Vitals:  Vitals Value Taken Time  BP 90/59 02/15/2018  2:09 PM  Temp    Pulse 66 02/15/2018  2:10 PM  Resp 22 02/15/2018  2:10 PM  SpO2 97 % 02/15/2018  2:10 PM  Vitals shown include unvalidated device data.  Last Pain:  Vitals:   02/15/18 1039  TempSrc: Oral      Patients Stated Pain Goal: 4 (74/93/55 2174)  Complications: No apparent anesthesia complications

## 2018-02-15 NOTE — Anesthesia Preprocedure Evaluation (Addendum)
Anesthesia Evaluation  Patient identified by MRN, date of birth, ID band Patient awake    Reviewed: Allergy & Precautions, NPO status , Patient's Chart, lab work & pertinent test results  Airway Mallampati: II  TM Distance: >3 FB Neck ROM: Full    Dental  (+) Dental Advisory Given, Missing, Partial Lower, Partial Upper   Pulmonary Current Smoker,    Pulmonary exam normal breath sounds clear to auscultation       Cardiovascular negative cardio ROS Normal cardiovascular exam Rhythm:Regular Rate:Normal     Neuro/Psych PSYCHIATRIC DISORDERS Anxiety Depression negative neurological ROS     GI/Hepatic negative GI ROS, Neg liver ROS,   Endo/Other  negative endocrine ROS  Renal/GU negative Renal ROS     Musculoskeletal  (+) Arthritis , Rheumatoid disorders,  LEFT HIP OSTEOARTHRITIS PROTRUSION   Abdominal   Peds  Hematology negative hematology ROS (+)   Anesthesia Other Findings Day of surgery medications reviewed with the patient.  Reproductive/Obstetrics                            Anesthesia Physical Anesthesia Plan  ASA: II  Anesthesia Plan: Spinal   Post-op Pain Management:    Induction:   PONV Risk Score and Plan: 1 and Propofol infusion, Treatment may vary due to age or medical condition and Midazolam  Airway Management Planned: Natural Airway and Nasal Cannula  Additional Equipment:   Intra-op Plan:   Post-operative Plan:   Informed Consent: I have reviewed the patients History and Physical, chart, labs and discussed the procedure including the risks, benefits and alternatives for the proposed anesthesia with the patient or authorized representative who has indicated his/her understanding and acceptance.   Dental advisory given  Plan Discussed with: CRNA, Anesthesiologist and Surgeon  Anesthesia Plan Comments:         Anesthesia Quick Evaluation

## 2018-02-15 NOTE — Anesthesia Procedure Notes (Signed)
Date/Time: 02/15/2018 12:09 PM Performed by: Sharlette Dense, CRNA Oxygen Delivery Method: Simple face mask

## 2018-02-15 NOTE — Interval H&P Note (Signed)
History and Physical Interval Note:  02/15/2018 10:27 AM  [name]  has presented today for surgery, with the diagnosis of LEFT HIP OSTEOARTHRITIS PROTRUSIO  The various methods of treatment have been discussed with the patient and family. After consideration of risks, benefits and other options for treatment, the patient has consented to  Procedure(s): TOTAL HIP ARTHROPLASTY ANTERIOR APPROACH (Left) as a surgical intervention .  The patient's history has been reviewed, patient examined, no change in status, stable for surgery.  I have reviewed the patient's chart and labs.  Questions were answered to the patient's satisfaction.     Kerin Salen

## 2018-02-16 ENCOUNTER — Encounter (HOSPITAL_COMMUNITY): Payer: Self-pay | Admitting: Orthopedic Surgery

## 2018-02-16 ENCOUNTER — Telehealth: Payer: Self-pay

## 2018-02-16 LAB — BASIC METABOLIC PANEL
ANION GAP: 3 — AB (ref 5–15)
BUN: 14 mg/dL (ref 6–20)
CO2: 25 mmol/L (ref 22–32)
Calcium: 8.2 mg/dL — ABNORMAL LOW (ref 8.9–10.3)
Chloride: 105 mmol/L (ref 98–111)
Creatinine, Ser: 0.49 mg/dL (ref 0.44–1.00)
GFR calc Af Amer: >60 mL/min (ref >60)
GFR calc non Af Amer: >60 mL/min (ref >60)
Glucose, Bld: 154 mg/dL — ABNORMAL HIGH (ref 70–99)
POTASSIUM: 4.6 mmol/L (ref 3.5–5.1)
Sodium: 133 mmol/L — ABNORMAL LOW (ref 135–145)

## 2018-02-16 LAB — CBC
HCT: 28.2 % — ABNORMAL LOW (ref 36.0–46.0)
HEMOGLOBIN: 9.1 g/dL — AB (ref 12.0–15.0)
MCH: 28.9 pg (ref 26.0–34.0)
MCHC: 32.3 g/dL (ref 30.0–36.0)
MCV: 89.5 fL (ref 80.0–100.0)
Platelets: 169 10*3/uL (ref 150–400)
RBC: 3.15 MIL/uL — AB (ref 3.87–5.11)
RDW: 14.1 % (ref 11.5–15.5)
WBC: 7.5 10*3/uL (ref 4.0–10.5)
nRBC: 0 % (ref 0.0–0.2)

## 2018-02-16 NOTE — Anesthesia Postprocedure Evaluation (Signed)
Anesthesia Post Note  Patient: [name]  Procedure(s) Performed: TOTAL HIP ARTHROPLASTY ANTERIOR APPROACH (Left Hip)     Patient location during evaluation: PACU Anesthesia Type: Spinal Level of consciousness: oriented, awake and alert and awake Pain management: pain level controlled Vital Signs Assessment: post-procedure vital signs reviewed and stable Respiratory status: spontaneous breathing, respiratory function stable and nonlabored ventilation Cardiovascular status: blood pressure returned to baseline and stable Postop Assessment: no headache, no backache, no apparent nausea or vomiting, patient able to bend at knees and spinal receding Anesthetic complications: no    Last Vitals:  Vitals:   02/16/18 0137 02/16/18 0518  BP: (!) 93/59 95/60  Pulse: 71 67  Resp: 16 16  Temp: 36.7 C 36.8 C  SpO2: 98% 100%    Last Pain:  Vitals:   02/16/18 0607  TempSrc:   PainSc: [place]

## 2018-02-16 NOTE — Care Management Note (Signed)
Case Management Note  Patient Details  Name: [name] MRN: [id number] Date of Birth: 1960/03/31  Subjective/Objective:   Discharge planning, spoke with patient at bedside. Have chosen [facility] for [facility] PT, evaluate and treat.   Action/Plan: Contacted [facility] for referral. They have accepted. Needs RW contacted [employer] to deliver to room. [phone]                 Expected Discharge Date:                  Expected Discharge Plan:  [place]  In-House Referral:  NA  Discharge planning Services  CM Consult  Post Acute Care Choice:  Durable Medical Equipment, Home Health Choice offered to:  Patient  DME Arranged:  Walker rolling DME Agency:  [employer]  HH Arranged:  PT [place]:  [facility] [employer]  Status of Service:  Completed, signed off  If discussed at [employer], dates discussed:    Additional Comments:  Guadalupe Maple, RN 02/16/2018, 4:48 PM

## 2018-02-16 NOTE — Progress Notes (Signed)
Patient ID: [name], female   DOB: Jan 17, 1961, 58 y.o.   MRN: [id number] PATIENT ID: [name]  MRN: [id number]  DOB/AGE:  07-09-1960 / 57 y.o.  1 Day Post-Op Procedure(s) (LRB): TOTAL HIP ARTHROPLASTY ANTERIOR APPROACH (Left)    PROGRESS NOTE Subjective: Patient is alert, oriented, no Nausea, no Vomiting, yes passing gas, . Taking PO well. Denies SOB, Chest or Calf Pain. Using Incentive Spirometer, PAS in place. Ambulate 70' Patient reports pain as  2/10  .    Objective: Vital signs in last 24 hours: Vitals:   02/15/18 2251 02/16/18 0136 02/16/18 0137 02/16/18 0518  BP:  (Abnormal) 93/59 (Abnormal) 93/59 95/60  Pulse: 83 71 71 67  Resp:  17 16 16   Temp:  98.1 F (36.7 C) 98.1 F (36.7 C) 98.3 F (36.8 C)  TempSrc:  Oral Oral Oral  SpO2: 99% 96% 98% 100%  Weight:      Height:          Intake/Output from previous day: I/O last 3 completed shifts: In: 3987 [P.O.:560; I.V.:2783.7; IV Piggyback:643.3] Out: 2130 [Urine:1580; Blood:550]   Intake/Output this shift: No intake/output data recorded.   LABORATORY DATA: Recent Labs    02/16/18 0328  WBC 7.5  HGB 9.1*  HCT 28.2*  PLT 169  NA 133*  K 4.6  CL 105  CO2 25  BUN 14  CREATININE 0.49  GLUCOSE 154*  CALCIUM 8.2*    Examination: Neurologically intact ABD soft Neurovascular intact Sensation intact distally Intact pulses distally Dorsiflexion/Plantar flexion intact Incision: dressing C/D/I No cellulitis present Compartment soft} XR AP&Lat of hip shows well placed\fixed THA  Assessment:   1 Day Post-Op Procedure(s) (LRB): TOTAL HIP ARTHROPLASTY ANTERIOR APPROACH (Left) ADDITIONAL DIAGNOSIS:  Expected Acute Blood Loss Anemia, RA, Nicotine dependence  Plan: PT/OT WBAT, THA  DVT Prophylaxis: SCDx72 hrs, ASA 81 mg BID x 2 weeks  DISCHARGE PLAN: Skilled Nursing Facility/Rehab,Patient may be a candidate for discharge home if she does well in therapy today.  Has limited help at home.  DISCHARGE  NEEDS: HHPT, Walker and 3-in-1 comode seat

## 2018-02-16 NOTE — Telephone Encounter (Signed)
I received a PA request for this patient for the Nicotrol Inhaler, she has medicaid and they prefer the following: Bupropion Sr, Chantix, Nicotine gum/lozenge/patch   It looks like the patient specifically requested the inhaler due to insurance but it is not an option with Medicaid

## 2018-02-16 NOTE — Progress Notes (Signed)
Physical Therapy Treatment Patient Details Name: [name] MRN: [id number] DOB: 11/11/1960 Today's Date: 02/16/2018    History of Present Illness 57 yo female s/p L DA-THA on 02/15/18. PMH includes RA, tobacco use, homelessness, anxiety, bronchitis, depression.     PT Comments    POD # 1 am session  assisted OOB to amb a greater distance in hallway then Performed some TE's following HEP handout.  Instructed on proper tech, freq as well as use of ICE.   Pt stated she has little help at home and pt wants to go to a Rehab.    Follow Up Recommendations  Follow surgeon's recommendation for DC plan and follow-up therapies;Supervision for mobility/OOB(limited help at home)     Equipment Recommendations  Rolling walker with 5" wheels    Recommendations for Other Services       Precautions / Restrictions Precautions Precautions: Fall Restrictions Weight Bearing Restrictions: No Other Position/Activity Restrictions: WBAT     Mobility  Bed Mobility Overal bed mobility: Needs Assistance Bed Mobility: Supine to Sit     Supine to sit: Min guard     General bed mobility comments: increased time   Transfers Overall transfer level: Needs assistance Equipment used: Rolling walker (2 wheeled) Transfers: Sit to/from Stand Sit to Stand: Min guard;From elevated surface         General transfer comment: Min guard for safety. Verbal cuing for hand placement x2.   Ambulation/Gait Ambulation/Gait assistance: Min guard;Supervision Gait Distance (Feet): 115 Feet Assistive device: Rolling walker (2 wheeled) Gait Pattern/deviations: Step-to pattern;Decreased stance time - left;Decreased weight shift to left;Antalgic     General Gait Details: Min guard for safety. Verbal cuing for sequencing. slight impulsive.   Stairs             Wheelchair Mobility    Modified Rankin (Stroke Patients Only)       Balance                                             Cognition Arousal/Alertness: Awake/alert Behavior During Therapy: WFL for tasks assessed/performed Overall Cognitive Status: Within Functional Limits for tasks assessed                                        Exercises   Total Hip Replacement TE's 10 reps ankle pumps 10 reps knee presses 10 reps heel slides 10 reps SAQ's 10 reps ABD Followed by ICE    General Comments        Pertinent Vitals/Pain Pain Assessment: 0-10 Pain Score: 5  Pain Location: L hip  Pain Descriptors / Indicators: Sore;Grimacing;Operative site guarding Pain Intervention(s): Monitored during session;Repositioned;Ice applied    Home Living                      Prior Function            PT Goals (current goals can now be found in the care plan section) Progress towards PT goals: Progressing toward goals    Frequency    7X/week      PT Plan Current plan remains appropriate    Co-evaluation              AM-PAC PT "6 Clicks" Mobility   Outcome Measure  Help needed  turning from your back to your side while in a flat bed without using bedrails?: A Little Help needed moving from lying on your back to sitting on the side of a flat bed without using bedrails?: A Little Help needed moving to and from a bed to a chair (including a wheelchair)?: A Little Help needed standing up from a chair using your arms (e.g., wheelchair or bedside chair)?: A Little Help needed to walk in hospital room?: A Little Help needed climbing 3-5 steps with a railing? : A Little 6 Click Score: 18    End of Session Equipment Utilized During Treatment: Gait belt Activity Tolerance: Patient tolerated treatment well Patient left: in chair;with chair alarm set;with call bell/phone within reach;with family/visitor present;with SCD's reapplied Nurse Communication: Mobility status PT Visit Diagnosis: Other abnormalities of gait and mobility (R26.89);Difficulty in walking, not elsewhere  classified (R26.2)     Time: 1040-1105 PT Time Calculation (min) (ACUTE ONLY): 25 min  Charges:  $Gait Training: 8-22 mins $Therapeutic Exercise: 8-22 mins                     Rica Koyanagi  PTA Acute  Rehabilitation Services Pager      [phone] Office      [phone]

## 2018-02-16 NOTE — Progress Notes (Signed)
Physical Therapy Treatment Patient Details Name: [name] MRN: [id number] DOB: 04-08-1960 Today's Date: 02/16/2018    History of Present Illness 57 yo female s/p L DA-THA on 02/15/18. PMH includes RA, tobacco use, homelessness, anxiety, bronchitis, depression.     PT Comments    POD # 1 pm session Assisted with amb a greater distance in hallway then performed all standing TE's following HEP handout. Pt progressing well with her mobility but asking about Skilled Nursing.   Follow Up Recommendations  Follow surgeon's recommendation for DC plan and follow-up therapies;Supervision for mobility/OOB(limited help at home)     Equipment Recommendations  Rolling walker with 5" wheels    Recommendations for Other Services       Precautions / Restrictions Precautions Precautions: Fall Restrictions Weight Bearing Restrictions: No Other Position/Activity Restrictions: WBAT     Mobility  Bed Mobility Overal bed mobility: Needs Assistance Bed Mobility: Supine to Sit;Sit to Supine     Supine to sit: Supervision Sit to supine: Supervision;Min guard   General bed mobility comments: increased time self able  Transfers Overall transfer level: Needs assistance Equipment used: Rolling walker (2 wheeled) Transfers: Sit to/from Stand Sit to Stand: Min guard;From elevated surface;Supervision         General transfer comment: supervision and increased time  Ambulation/Gait Ambulation/Gait assistance: Supervision Gait Distance (Feet): 145 Feet Assistive device: Rolling walker (2 wheeled) Gait Pattern/deviations: Step-to pattern;Decreased stance time - left;Decreased weight shift to left;Antalgic Gait velocity: decr    General Gait Details: good safety cognition antolerated an increased distance.    Stairs             Wheelchair Mobility    Modified Rankin (Stroke Patients Only)       Balance                                             Cognition Arousal/Alertness: Awake/alert Behavior During Therapy: WFL for tasks assessed/performed Overall Cognitive Status: Within Functional Limits for tasks assessed                                        Exercises  10 reps all standing TE's Followed by ICE    General Comments        Pertinent Vitals/Pain Pain Assessment: 0-10 Pain Score: 5  Pain Location: L hip  Pain Descriptors / Indicators: Sore;Grimacing;Operative site guarding Pain Intervention(s): Monitored during session;Repositioned;Ice applied    Home Living                      Prior Function            PT Goals (current goals can now be found in the care plan section) Progress towards PT goals: Progressing toward goals    Frequency    7X/week      PT Plan Current plan remains appropriate    Co-evaluation              AM-PAC PT "6 Clicks" Mobility   Outcome Measure  Help needed turning from your back to your side while in a flat bed without using bedrails?: A Little Help needed moving from lying on your back to sitting on the side of a flat bed without using bedrails?: A Little Help needed moving  to and from a bed to a chair (including a wheelchair)?: A Little Help needed standing up from a chair using your arms (e.g., wheelchair or bedside chair)?: A Little Help needed to walk in hospital room?: A Little Help needed climbing 3-5 steps with a railing? : A Little 6 Click Score: 18    End of Session Equipment Utilized During Treatment: Gait belt Activity Tolerance: Patient tolerated treatment well Patient left: in chair;with chair alarm set;with call bell/phone within reach;with family/visitor present;with SCD's reapplied Nurse Communication: Mobility status PT Visit Diagnosis: Other abnormalities of gait and mobility (R26.89);Difficulty in walking, not elsewhere classified (R26.2)     Time: 1435-1450 PT Time Calculation (min) (ACUTE ONLY): 15 min  Charges:   $Gait Training: 8-22 mins $Therapeutic Exercise: 8-22 mins                     Rica Koyanagi  PTA Acute  Rehabilitation Services Pager      [phone] Office      [phone]

## 2018-02-17 LAB — CBC
HCT: 25.9 % — ABNORMAL LOW (ref 36.0–46.0)
Hemoglobin: 8.2 g/dL — ABNORMAL LOW (ref 12.0–15.0)
MCH: 29 pg (ref 26.0–34.0)
MCHC: 31.7 g/dL (ref 30.0–36.0)
MCV: 91.5 fL (ref 80.0–100.0)
Platelets: 133 10*3/uL — ABNORMAL LOW (ref 150–400)
RBC: 2.83 MIL/uL — ABNORMAL LOW (ref 3.87–5.11)
RDW: 14.5 % (ref 11.5–15.5)
WBC: 5.7 10*3/uL (ref 4.0–10.5)
nRBC: 0 % (ref 0.0–0.2)

## 2018-02-17 MED ORDER — NICOTINE 14 MG/24HR TD PT24: 14.0000 mg | MEDICATED_PATCH | Freq: Every day | TRANSDERMAL | 1 refills | Status: DC

## 2018-02-17 NOTE — Telephone Encounter (Signed)
Patient called to check on the status of the inhaler and was informed of notes put in. Patient says that they would like to get the patches if the inhaler is not covered.   Patient says that the pharmacy told her that PCP has to authorize medication so insurance can pay. She would like for someone to follow up with the pharmacy on what medicaid will cover. Patient states that she was told medicaid will not pay for the gum. Please follow up.

## 2018-02-17 NOTE — Care Management Note (Signed)
Case Management Note  Patient Details  Name: [name] MRN: [id number] Date of Birth: 12/31/1960  Subjective/Objective:       Patient now requesting 3n1 since she will d/c home           Action/Plan: Contacted [facility] to deliver to the room  Expected Discharge Date:  02/17/18               Expected Discharge Plan:  [place]  In-House Referral:  NA  Discharge planning Services  CM Consult  Post Acute Care Choice:  Durable Medical Equipment, Home Health Choice offered to:  Patient  DME Arranged:  Walker rolling, 3-N-1 DME Agency:  [employer], [place]:  PT [place] Agency:  [facility])  Status of Service:  Completed, signed off  If discussed at [employer], dates discussed:    Additional Comments:  Guadalupe Maple, RN 02/17/2018, 11:45 AM

## 2018-02-17 NOTE — Progress Notes (Signed)
Physical Therapy Treatment Patient Details Name: [name] MRN: [id number] DOB: 1960/10/18 Today's Date: 02/17/2018    History of Present Illness 57 yo female s/p L DA-THA on 02/15/18. PMH includes RA, tobacco use, homelessness, anxiety, bronchitis, depression.     PT Comments    Pt is mobilizing quite well; ready for d/c from PT standpoint   Follow Up Recommendations  Follow surgeon's recommendation for DC plan and follow-up therapies;Supervision for mobility/OOB     Equipment Recommendations  Rolling walker with 5" wheels    Recommendations for Other Services       Precautions / Restrictions Precautions Precautions: Fall Restrictions Weight Bearing Restrictions: No Other Position/Activity Restrictions: WBAT     Mobility  Bed Mobility Overal bed mobility: Needs Assistance Bed Mobility: Supine to Sit;Sit to Supine     Supine to sit: Modified independent (Device/Increase time) Sit to supine: Modified independent (Device/Increase time)      Transfers Overall transfer level: Needs assistance Equipment used: Rolling walker (2 wheeled) Transfers: Sit to/from Stand Sit to Stand: Modified independent (Device/Increase time)            Ambulation/Gait Ambulation/Gait assistance: Modified independent (Device/Increase time);Supervision Gait Distance (Feet): 80 Feet Assistive device: Rolling walker (2 wheeled) Gait Pattern/deviations: Step-through pattern Gait velocity: decr        Stairs Stairs: Yes Stairs assistance: Min guard Stair Management: One rail Right;Two rails;Sideways Number of Stairs: 3 General stair comments: cues for sequence   Wheelchair Mobility    Modified Rankin (Stroke Patients Only)       Balance                                            Cognition Arousal/Alertness: Awake/alert Behavior During Therapy: WFL for tasks assessed/performed Overall Cognitive Status: Within Functional Limits for tasks  assessed                                        Exercises Total Joint Exercises Ankle Circles/Pumps: AROM;Both;10 reps Quad Sets: AROM;Both;10 reps Short Arc Quad: AROM;Left;15 reps Heel Slides: AROM;Left;15 reps Hip ABduction/ADduction: AROM;20 reps;Supine;Standing Long Arc Quad: AROM;Left;10 reps;Seated Knee Flexion: AROM;Left;10 reps;Standing Marching in Standing: AROM;Left;10 reps;Standing    General Comments        Pertinent Vitals/Pain Pain Assessment: 0-10 Pain Score: 2  Pain Location: L hip  Pain Descriptors / Indicators: Sore;Grimacing;Operative site guarding Pain Intervention(s): Limited activity within patient's tolerance;Monitored during session;Premedicated before session    Home Living                      Prior Function            PT Goals (current goals can now be found in the care plan section) Acute Rehab PT Goals Patient Stated Goal: walk without pain, increase walking distance PT Goal Formulation: With patient Time For Goal Achievement: 02/22/18 Potential to Achieve Goals: Good Progress towards PT goals: Progressing toward goals    Frequency    7X/week      PT Plan Current plan remains appropriate    Co-evaluation              AM-PAC PT "6 Clicks" Mobility   Outcome Measure  Help needed turning from your back to your side while in a flat bed  without using bedrails?: A Little Help needed moving from lying on your back to sitting on the side of a flat bed without using bedrails?: A Little Help needed moving to and from a bed to a chair (including a wheelchair)?: A Little Help needed standing up from a chair using your arms (e.g., wheelchair or bedside chair)?: A Little Help needed to walk in hospital room?: A Little Help needed climbing 3-5 steps with a railing? : A Little 6 Click Score: 18    End of Session   Activity Tolerance: Patient tolerated treatment well Patient left: in bed;with call bell/phone  within reach   PT Visit Diagnosis: Other abnormalities of gait and mobility (R26.89);Difficulty in walking, not elsewhere classified (R26.2)     Time: 1282-0813 PT Time Calculation (min) (ACUTE ONLY): 23 min  Charges:  $Gait Training: 8-22 mins $Therapeutic Exercise: 8-22 mins                     Kenyon Ana, PT  Pager: [phone] Acute Rehab Dept [facility]): [phone]   02/17/2018    [facility] 02/17/2018, 12:03 PM

## 2018-02-17 NOTE — Plan of Care (Signed)
Patient discharged home in stable condition. Rx given. Discharge papers given to patient, she verbalized understanding. IV dc'd

## 2018-02-17 NOTE — Progress Notes (Signed)
PATIENT ID: [name]  MRN: [id number]  DOB/AGE:  59-Dec-1962 / 57 y.o.  2 Days Post-Op Procedure(s) (LRB): TOTAL HIP ARTHROPLASTY ANTERIOR APPROACH (Left)    PROGRESS NOTE Subjective: Patient is alert, oriented, no Nausea, no Vomiting, yes passing gas, . Taking PO well. Denies SOB, Chest or Calf Pain. Using Incentive Spirometer, PAS in place. Ambulate WBAT with pt walking 145 ft with therapy Patient reports pain as  6/10  .    Objective: Vital signs in last 24 hours: Vitals:   02/16/18 1006 02/16/18 1332 02/16/18 2013 02/17/18 0344  BP: (!) 89/58 105/78 103/65 100/61  Pulse: 74 66 82 76  Resp: 18 16 17 14   Temp: 98.3 F (36.8 C) 98.2 F (36.8 C) 98.6 F (37 C) 98.6 F (37 C)  TempSrc:  Oral Oral Oral  SpO2: 100% 100% 98% 98%  Weight:      Height:          Intake/Output from previous day: I/O last 3 completed shifts: In: 2522.1 [P.O.:1680; I.V.:842.1] Out: 2505 [Urine:2505]   Intake/Output this shift: No intake/output data recorded.   LABORATORY DATA: Recent Labs    02/16/18 0328 02/17/18 0326  WBC 7.5 5.7  HGB 9.1* 8.2*  HCT 28.2* 25.9*  PLT 169 133*  NA 133*  --   K 4.6  --   CL 105  --   CO2 25  --   BUN 14  --   CREATININE 0.49  --   GLUCOSE 154*  --   CALCIUM 8.2*  --     Examination: Neurologically intact Neurovascular intact Sensation intact distally Intact pulses distally Dorsiflexion/Plantar flexion intact Incision: dressing C/D/I and no drainage No cellulitis present Compartment soft} XR AP&Lat of hip shows well placed\fixed THA  Assessment:   2 Days Post-Op Procedure(s) (LRB): TOTAL HIP ARTHROPLASTY ANTERIOR APPROACH (Left) ADDITIONAL DIAGNOSIS:  Expected Acute Blood Loss Anemia, RA, Nicotine dependence  Plan: PT/OT WBAT, THA  DVT Prophylaxis: SCDx72 hrs, ASA 81 mg BID x 2 weeks  DISCHARGE PLAN: Home  DISCHARGE NEEDS: HHPT, Walker and 3-in-1 comode seat

## 2018-02-17 NOTE — Discharge Summary (Signed)
Patient ID: [name] MRN: [id number] DOB/AGE: 1960/05/16 57 y.o.  Admit date: 02/15/2018 Discharge date: 02/17/2018  Admission Diagnoses:  Principal Problem:   Protrusio acetabuli Active Problems:   Osteoarthritis of left hip   Discharge Diagnoses:  Same  Past Medical History:  Diagnosis Date  . Anxiety   . Bronchitis age 42  . Depression   . Hx of bladder infections    been over 5 years since last one   . Rheumatoid arthritis ([place])    unc rheum   . Tobacco abuse   . Trouble in sleeping     Surgeries: Procedure(s): TOTAL HIP ARTHROPLASTY ANTERIOR APPROACH on 02/15/2018   Consultants:   Discharged Condition: Improved  Hospital Course: [name] is an 57 y.o. female who was admitted 02/15/2018 for operative treatment ofProtrusio acetabuli. Patient has severe unremitting pain that affects sleep, daily activities, and work/hobbies. After pre-op clearance the patient was taken to the operating room on 02/15/2018 and underwent  Procedure(s): TOTAL HIP ARTHROPLASTY ANTERIOR APPROACH.    Patient was given perioperative antibiotics:  Anti-infectives (From admission, onward)   Start     Dose/Rate Route Frequency Ordered Stop   02/15/18 1030  vancomycin (VANCOCIN) IVPB 1000 mg/200 mL premix     1,000 mg 200 mL/hr over 60 Minutes Intravenous On call to O.R. 02/15/18 1029 02/15/18 1225   02/15/18 1030  ceFAZolin (ANCEF) IVPB 2g/100 mL premix     2 g 200 mL/hr over 30 Minutes Intravenous On call to O.R. 02/15/18 1029 02/15/18 1240       Patient was given sequential compression devices, early ambulation, and chemoprophylaxis to prevent DVT.  Patient benefited maximally from hospital stay and there were no complications.    Recent vital signs:  Patient Vitals for the past 24 hrs:  BP Temp Temp src Pulse Resp SpO2  02/17/18 0344 100/61 98.6 F (37 C) Oral 76 14 98 %  02/16/18 2013 103/65 98.6 F (37 C) Oral 82 17 98 %  02/16/18 1332 105/78 98.2 F (36.8 C) Oral 66  16 100 %  02/16/18 1006 (!) 89/58 98.3 F (36.8 C) - 74 18 100 %     Recent laboratory studies:  Recent Labs    02/16/18 0328 02/17/18 0326  WBC 7.5 5.7  HGB 9.1* 8.2*  HCT 28.2* 25.9*  PLT 169 133*  NA 133*  --   K 4.6  --   CL 105  --   CO2 25  --   BUN 14  --   CREATININE 0.49  --   GLUCOSE 154*  --   CALCIUM 8.2*  --      Discharge Medications:   Allergies as of 02/17/2018   No Known Allergies     Medication List    STOP taking these medications   ibuprofen 800 MG tablet Commonly known as:  ADVIL,MOTRIN   traMADol 50 MG tablet Commonly known as:  ULTRAM     TAKE these medications   albuterol 108 (90 Base) MCG/ACT inhaler Commonly known as:  PROVENTIL HFA;VENTOLIN HFA Inhale 2 puffs into the lungs every 6 (six) hours as needed for wheezing or shortness of breath.   aspirin EC 81 MG tablet Take 1 tablet (81 mg total) by mouth 2 (two) times daily.   FOLIC ACID PO Take 1 mg by mouth daily.   methotrexate 2.5 MG tablet Commonly known as:  RHEUMATREX Take 6 tabs PO once a wk What changed:    how much to take  how to take this  when to take this   multivitamin with minerals Tabs tablet Take 1 tablet by mouth daily.   nicotine 10 MG inhaler Commonly known as:  NICOTROL Inhale 1 Cartridge (1 continuous puffing total) into the lungs as needed for smoking cessation. Max of 10 cartridges a day.   nicotine polacrilex 2 MG gum Commonly known as:  NICORETTE Take 1 each (2 mg total) by mouth as needed for smoking cessation. Max of no more than 30 pieces/24 hr   oxyCODONE-acetaminophen 5-325 MG tablet Commonly known as:  PERCOCET/ROXICET Take 1 tablet by mouth every 4 (four) hours as needed for severe pain.   predniSONE 1 MG tablet Commonly known as:  DELTASONE Take 1 mg by mouth daily with breakfast.   tiZANidine 2 MG tablet Commonly known as:  ZANAFLEX Take 1 tablet (2 mg total) by mouth every 6 (six) hours as needed.   Vitamin D3 75 MCG (3000  UT) Tabs Take 3,000 Units by mouth daily.            Durable Medical Equipment  (From admission, onward)         Start     Ordered   02/15/18 1544  DME Walker rolling  Once    Question:  Patient needs a walker to treat with the following condition  Answer:  Status post total hip replacement, left   02/15/18 1543   02/15/18 1544  DME 3 n 1  Once     02/15/18 1543           Discharge Care Instructions  (From admission, onward)         Start     Ordered   02/17/18 0000  Weight bearing as tolerated     02/17/18 0746          Diagnostic Studies: Dg Chest 2 View  Result Date: 02/09/2018 CLINICAL DATA:  Pre-op for left hip replacement. Denies any chest complaints today. Smoker. EXAM: CHEST - 2 VIEW COMPARISON:  None. FINDINGS: Lungs are hyperinflated. Heart size is normal. Lungs are clear. No pulmonary edema. Degenerative changes are seen in both shoulders. IMPRESSION: Hyperinflation.  No evidence for acute  abnormality. Electronically Signed   By: Nolon Nations M.D.   On: 02/09/2018 07:56   Dg C-arm 1-60 Min-no Report  Result Date: 02/15/2018 Fluoroscopy was utilized by the requesting physician.  No radiographic interpretation.   Dg Hip Operative Unilat W Or W/o Pelvis Left  Result Date: 02/15/2018 CLINICAL DATA:  Left hip replacement EXAM: OPERATIVE LEFT HIP WITH PELVIS COMPARISON:  None. FLUOROSCOPY TIME:  Radiation Exposure Index (as provided by the fluoroscopic device): 1 mGy If the device does not provide the exposure index: Fluoroscopy Time:  37 seconds Number of Acquired Images:  5 FINDINGS: Left hip replacement is noted. No acute bony or soft tissue abnormality is seen. IMPRESSION: Status post left hip replacement Electronically Signed   By: Inez Catalina M.D.   On: 02/15/2018 13:51    Disposition: Discharge disposition: 01-Home or Self Care       Discharge Instructions    Call MD / Call 911   Complete by:  As directed    If you experience chest pain or  shortness of breath, CALL 911 and be transported to the hospital emergency room.  If you develope a fever above 101 F, pus (white drainage) or increased drainage or redness at the wound, or calf pain, call your surgeon's office.   Constipation Prevention  Complete by:  As directed    Drink plenty of fluids.  Prune juice may be helpful.  You may use a stool softener, such as Colace (over the counter) 100 mg twice a day.  Use MiraLax (over the counter) for constipation as needed.   Diet - low sodium heart healthy   Complete by:  As directed    Driving restrictions   Complete by:  As directed    No driving for 2 weeks   Increase activity slowly as tolerated   Complete by:  As directed    Patient may shower   Complete by:  As directed    You may shower without a dressing once there is no drainage.  Do not wash over the wound.  If drainage remains, cover wound with plastic wrap and then shower.   Weight bearing as tolerated   Complete by:  As directed       Follow-up Information    [name], MD In 2 weeks.   Specialty:  Orthopedic Surgery Contact information: [place] [address] [phone]        [facility] Follow up.   Specialty:  [place] Why:  physical therapy Contact information: [place] [address] [phone]            Signed: Joanell Rising 02/17/2018, 7:46 AM

## 2018-02-18 ENCOUNTER — Telehealth: Payer: Self-pay | Admitting: Internal Medicine

## 2018-02-18 MED ORDER — PREDNISONE 1 MG PO TABS: 1.0000 mg | ORAL_TABLET | Freq: Every day | ORAL | 1 refills | Status: DC

## 2018-02-18 MED ORDER — IBUPROFEN 600 MG PO TABS: 600.0000 mg | ORAL_TABLET | Freq: Three times a day (TID) | ORAL | 1 refills | Status: DC | PRN

## 2018-02-18 NOTE — Telephone Encounter (Signed)
1) Medication(s) Requested (by name): Pretazone ibprofen 2) [facility] Choice:  [facility] [address] [facility] [place]   [facility]. She says medicaid will not cover the medication because it is not coming from the PCP. She says that the prescription has to be sent in from the PCP.

## 2018-02-18 NOTE — Telephone Encounter (Signed)
Rx for patches was already sent to pharmacy.

## 2018-02-18 NOTE — Telephone Encounter (Signed)
I am unsure of the billing issues with these medications, when I called the pharmacy they report that the patient picked up both Ibuprofen and Prednisone yesterday 02/17/18 around 6pm.  I called and spoke with the patient who reports that she paid full price for both meds yesterday because the doctor who wrote them was not medicaid approved, therefore medicaid would not pay for the RX's. She is [facility] so [facility] can [facility] for them and she can get her money back. They may be able to accommodate that at the pharmacy depending on the pharmacy manager and whether or not you find it appropriate to send refills for these prescriptions.   She also mentioned she may want a home health worker since she is post-hip replacement. I let her know she has to be seen in office and if found appropriate the doctor can help her move forward with that. I let her know to reach out to [place] or her surgeon with post-surgical complications or to call 911 in case of emergency.

## 2018-02-18 NOTE — Telephone Encounter (Signed)
Could you follow up with this please

## 2018-02-18 NOTE — Telephone Encounter (Signed)
Could you follow up with pt  

## 2018-02-18 NOTE — Telephone Encounter (Signed)
Rfs on Prednisone and [facility]uprofen [facility] to [facility].

## 2018-03-22 NOTE — Progress Notes (Deleted)
Office Visit Note  Patient: [name]             Date of Birth: [birth year]           MRN: [id number]             PCP: Ladell Pier, MD Referring: Ladell Pier, MD Visit Date: 04/05/2018 Occupation: @GUAROCC @  Subjective:  No chief complaint on file.   History of Present Illness: [name] is a 58 y.o. female ***   Activities of Daily Living:  Patient reports morning stiffness for *** {minute/hour:19697}.   Patient {ACTIONS;DENIES/REPORTS:21021675::"Denies"} nocturnal pain.  Difficulty dressing/grooming: {ACTIONS;DENIES/REPORTS:21021675::"Denies"} Difficulty climbing stairs: {ACTIONS;DENIES/REPORTS:21021675::"Denies"} Difficulty getting out of chair: {ACTIONS;DENIES/REPORTS:21021675::"Denies"} Difficulty using hands for taps, buttons, cutlery, and/or writing: {ACTIONS;DENIES/REPORTS:21021675::"Denies"}  No Rheumatology ROS completed.   PMFS History:  Patient Active Problem List   Diagnosis Date Noted  . Osteoarthritis of left hip 02/15/2018  . 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 Rheumatoid arthritis involving multiple sites ([place]) 01/15/2018  . Tobacco dependence 01/15/2018  . Arthritis of left hip 01/15/2018  . Opioid use agreement exists 01/15/2018  . Positive depression screening 01/15/2018  . Homeless 01/15/2018    Past Medical History:  Diagnosis Date  . Anxiety   . Bronchitis age 64  . Depression   . Hx of bladder infections    been over 5 years since last one   . Rheumatoid arthritis ([place])    unc rheum   . Tobacco abuse   . Trouble in sleeping     Family History  Problem Relation Age of Onset  . Breast cancer Mother    Past Surgical History:  Procedure Laterality Date  . TOTAL HIP ARTHROPLASTY Left 02/15/2018   Procedure: TOTAL HIP ARTHROPLASTY ANTERIOR APPROACH;  Surgeon: [name], MD;  Location: [facility];  Service: Orthopedics;  Laterality: Left;  . WISDOM TOOTH EXTRACTION     Social History   Social History Narrative  . Not on  file    There is no immunization history on file for this patient.   Objective: Vital Signs: There were no vitals taken for this visit.   Physical Exam   Musculoskeletal Exam: ***  CDAI Exam: CDAI Score: Not documented Patient Global Assessment: Not documented; Provider Global Assessment: Not documented Swollen: Not documented; Tender: Not documented Joint Exam   Not documented   There is currently no information documented on the homunculus. Go to the Rheumatology activity and complete the homunculus joint exam.  Investigation: No additional findings. Component     Latest Ref Rng & Units 01/15/2018  Cholesterol, Total     100 - 199 mg/dL 165  Triglycerides     0 - 149 mg/dL 103  HDL Cholesterol     >39 mg/dL 77  VLDL Cholesterol Cal     5 - 40 mg/dL 21  LDL (calc)     0 - 99 mg/dL 67  Total CHOL/HDL Ratio     0.0 - 4.4 ratio 2.1  HIV Screen 4th Generation wRfx     Non Reactive Non Reactive  Hep C Virus Ab     0.0 - 0.9 s/co ratio <0.1  RA Latex Turbid.     0.0 - 13.9 IU/mL 102.1 (H)   Imaging: No results found.  Recent Labs: Lab Results  Component Value Date   WBC 5.7 02/17/2018   HGB 8.2 (L) 02/17/2018   PLT 133 (L) 02/17/2018   NA 133 (L) 02/16/2018   K 4.6 02/16/2018  CL 105 02/16/2018   CO2 25 02/16/2018   GLUCOSE 154 (H) 02/16/2018   BUN 14 02/16/2018   CREATININE 0.49 02/16/2018   BILITOT 0.3 01/15/2018   ALKPHOS 87 01/15/2018   AST 12 01/15/2018   ALT 12 01/15/2018   PROT 6.8 01/15/2018   ALBUMIN 4.3 01/15/2018   CALCIUM 8.2 (L) 02/16/2018   GFRAA >60 02/16/2018    Speciality Comments: No specialty comments available.  Procedures:  No procedures performed Allergies: Patient has no known allergies.   Assessment / Plan:     Visit Diagnoses: No diagnosis found.   Orders: No orders of the defined types were placed in this encounter.  No orders of the defined types were placed in this encounter.   Face-to-face time spent with  patient was *** minutes. Greater than 50% of time was spent in counseling and coordination of care.  Follow-Up Instructions: No follow-ups on file.   Ofilia Neas, PA-C  Note - This record has been created using Dragon software.  Chart creation errors have been sought, but may not always  have been located. Such creation errors do not reflect on  the standard of medical care.

## 2018-03-25 ENCOUNTER — Encounter: Payer: Self-pay | Admitting: Internal Medicine

## 2018-03-25 ENCOUNTER — Ambulatory Visit: Payer: Medicaid Other | Attending: Internal Medicine | Admitting: Internal Medicine

## 2018-03-25 ENCOUNTER — Other Ambulatory Visit (HOSPITAL_COMMUNITY)
Admission: RE | Admit: 2018-03-25 | Discharge: 2018-03-25 | Disposition: A | Discharge status: Home / Self Care | Payer: Medicaid Other | Source: Ambulatory Visit | Attending: Internal Medicine | Admitting: Internal Medicine

## 2018-03-25 VITALS — BP 105/73 | HR 70 | Temp 97.6°F | Resp 16 | Ht 65.5 in | Wt 107.6 lb

## 2018-03-25 DIAGNOSIS — Z7952 Long term (current) use of systemic steroids: Secondary | ICD-10-CM | POA: Diagnosis not present

## 2018-03-25 DIAGNOSIS — Z803 Family history of malignant neoplasm of breast: Secondary | ICD-10-CM | POA: Diagnosis not present

## 2018-03-25 DIAGNOSIS — Z1239 Encounter for other screening for malignant neoplasm of breast: Secondary | ICD-10-CM

## 2018-03-25 DIAGNOSIS — M1612 Unilateral primary osteoarthritis, left hip: Secondary | ICD-10-CM | POA: Diagnosis not present

## 2018-03-25 DIAGNOSIS — Z23 Encounter for immunization: Secondary | ICD-10-CM

## 2018-03-25 DIAGNOSIS — M069 Rheumatoid arthritis, unspecified: Secondary | ICD-10-CM

## 2018-03-25 DIAGNOSIS — Z590 Homelessness: Secondary | ICD-10-CM | POA: Diagnosis not present

## 2018-03-25 DIAGNOSIS — Z7982 Long term (current) use of aspirin: Secondary | ICD-10-CM | POA: Diagnosis not present

## 2018-03-25 DIAGNOSIS — Z1211 Encounter for screening for malignant neoplasm of colon: Secondary | ICD-10-CM

## 2018-03-25 DIAGNOSIS — Z124 Encounter for screening for malignant neoplasm of cervix: Secondary | ICD-10-CM | POA: Insufficient documentation

## 2018-03-25 DIAGNOSIS — F17210 Nicotine dependence, cigarettes, uncomplicated: Secondary | ICD-10-CM | POA: Diagnosis not present

## 2018-03-25 DIAGNOSIS — Z79899 Other long term (current) drug therapy: Secondary | ICD-10-CM | POA: Insufficient documentation

## 2018-03-25 DIAGNOSIS — Z96642 Presence of left artificial hip joint: Secondary | ICD-10-CM | POA: Insufficient documentation

## 2018-03-25 MED ORDER — IBUPROFEN 600 MG PO TABS: 600.0000 mg | ORAL_TABLET | Freq: Three times a day (TID) | ORAL | 1 refills | Status: DC | PRN

## 2018-03-25 MED ORDER — ALBUTEROL SULFATE HFA 108 (90 BASE) MCG/ACT IN AERS: 2.0000 | INHALATION_SPRAY | Freq: Four times a day (QID) | RESPIRATORY_TRACT | 2 refills | Status: DC | PRN

## 2018-03-25 MED ORDER — PREDNISONE 1 MG PO TABS: 1.0000 mg | ORAL_TABLET | Freq: Every day | ORAL | 0 refills | Status: DC

## 2018-03-25 MED ORDER — METHOTREXATE 2.5 MG PO TABS: ORAL_TABLET | ORAL | 1 refills | Status: DC

## 2018-03-25 MED FILL — METHOTREXATE SODIUM 2.5 MG: 2.5 | 28 days supply | Qty: 24 | Fill #0

## 2018-03-25 MED FILL — PROAIR HFA 90 MCG INHALER: 108 (90 BAS | 25 days supply | Qty: 9 | Fill #0

## 2018-03-25 MED FILL — IBUPROFEN 600 MG TABLET: 600 | 10 days supply | Qty: 30 | Fill #0

## 2018-03-25 NOTE — Patient Instructions (Signed)
Td Vaccine (Tetanus and Diphtheria): What You Need to Know 1. Why get vaccinated? Tetanus  and diphtheria are very serious diseases. They are rare in the [place] today, but people who do become infected often have severe complications. Td vaccine is used to protect adolescents and adults from both of these diseases. Both tetanus and diphtheria are infections caused by bacteria. Diphtheria spreads from person to person through coughing or sneezing. Tetanus-causing bacteria enter the body through cuts, scratches, or wounds. TETANUS (Lockjaw) causes painful muscle tightening and stiffness, usually all over the body.  It can lead to tightening of muscles in the head and neck so you can't open your mouth, swallow, or sometimes even breathe. Tetanus kills about 1 out of every 10 people who are infected even after receiving the best medical care. DIPHTHERIA can cause a thick coating to form in the back of the throat.  It can lead to breathing problems, paralysis, heart failure, and death. Before vaccines, as many as 200,000 cases of diphtheria and hundreds of cases of tetanus were reported in the [place] each year. Since vaccination began, reports of cases for both diseases have dropped by about 99%. 2. Td vaccine Td vaccine can protect adolescents and adults from tetanus and diphtheria. Td is usually given as a booster dose every 10 years but it can also be given earlier after a severe and dirty wound or burn. Another vaccine, called Tdap, which protects against pertussis in addition to tetanus and diphtheria, is sometimes recommended instead of Td vaccine. Your doctor or the person giving you the vaccine can give you more information. Td may safely be given at the same time as other vaccines. 3. Some people should not get this vaccine  A person who has ever had a life-threatening allergic reaction after a previous dose of any tetanus or diphtheria containing vaccine, OR has a severe allergy  to any part of this vaccine, should not get Td vaccine. Tell the person giving the vaccine about any severe allergies.  Talk to your doctor if you: ? had severe pain or swelling after any vaccine containing diphtheria or tetanus, ? ever had a condition called Guillain Barr Syndrome (GBS), ? aren't feeling well on the day the shot is scheduled. 4. Risks of a vaccine reaction With any medicine, including vaccines, there is a chance of side effects. These are usually mild and go away on their own. Serious reactions are also possible but are rare. Most people who get Td vaccine do not have any problems with it. Mild Problems following Td vaccine: (Did not interfere with activities)  Pain where the shot was given (about 8 people in 10)  Redness or swelling where the shot was given (about 1 person in 4)  Mild fever (rare)  Headache (about 1 person in 4)  Tiredness (about 1 person in 4) Moderate Problems following Td vaccine: (Interfered with activities, but did not require medical attention)  Fever over 102F (rare) Severe Problems following Td vaccine: (Unable to perform usual activities; required medical attention)  Swelling, severe pain, bleeding and/or redness in the arm where the shot was given (rare). Problems that could happen after any vaccine:  People sometimes faint after a medical procedure, including vaccination. Sitting or lying down for about 15 minutes can help prevent fainting, and injuries caused by a fall. Tell your doctor if you feel dizzy, or have vision changes or ringing in the ears.  Some people get severe pain in the shoulder and have   difficulty moving the arm where a shot was given. This happens very rarely.  Any medication can cause a severe allergic reaction. Such reactions from a vaccine are very rare, estimated at fewer than 1 in a million doses, and would happen within a few minutes to a few hours after the vaccination. As with any medicine, there is a  very remote chance of a vaccine causing a serious injury or death. The safety of vaccines is always being monitored. For more information, visit: www.cdc.gov/vaccinesafety/ 5. What if there is a serious reaction? What should I look for?  Look for anything that concerns you, such as signs of a severe allergic reaction, very high fever, or unusual behavior. Signs of a severe allergic reaction can include hives, swelling of the face and throat, difficulty breathing, a fast heartbeat, dizziness, and weakness. These would usually start a few minutes to a few hours after the vaccination. What should I do?  If you think it is a severe allergic reaction or other emergency that can't wait, call 9-1-1 or get the person to the nearest hospital. Otherwise, call your doctor.  Afterward, the reaction should be reported to the Vaccine Adverse Event Reporting System (VAERS). Your doctor might file this report, or you can do it yourself through the VAERS web site at www.vaers.hhs.gov, or by calling 1-800-822-7967. VAERS does not give medical advice. 6. The National Vaccine Injury Compensation Program The National Vaccine Injury Compensation Program (VICP) is a federal program that was created to compensate people who may have been injured by certain vaccines. Persons who believe they may have been injured by a vaccine can learn about the program and about filing a claim by calling 1-800-338-2382 or visiting the VICP website at www.hrsa.gov/vaccinecompensation. There is a time limit to file a claim for compensation. 7. How can I learn more?  Ask your doctor. He or she can give you the vaccine package insert or suggest other sources of information.  Call your local or state health department.  Contact the Centers for Disease Control and Prevention (CDC): ? Call 1-800-232-4636 (1-800-CDC-INFO) ? Visit CDC's website at www.cdc.gov/vaccines Vaccine Information Statement Td Vaccine (06/19/15) This information is  not intended to replace advice given to you by your health care provider. Make sure you discuss any questions you have with your health care provider. Document Released: 12/22/2005 Document Revised: 10/12/2017 Document Reviewed: 10/12/2017 Elsevier Interactive Patient Education  2019 [employer]  

## 2018-03-25 NOTE — Progress Notes (Signed)
Patient ID: [name], female    DOB: 1960/03/20  MRN: [id number]  CC: Gynecologic Exam   Subjective: [name] is a 58 y.o. female who presents for PAP smear Her concerns today include:  Patient with history of RA involving multiple joints, tobacco dependence, recent left THR, homeless Pos dep screen  Had LT THR - "I'm doing well."  Little numbness in the area over LT hip laterally since surgery.  She has completed home physical therapy.  Homeless:  Still homeless.  Staying with a friend.  RA:  Has upcoming appt with Rheumatologist later this month   Pt is G3P1 (2 abortions) Last pap was over 2 yrs ago.  Had abn many yrs ago and had cryo procedure No dischg or itching Sexually active in past yr.  1 partner Post menopausal Fhx of cervical and breast CA in mom.  Mom died at age 56 from breast CA.  Never had colon cancer screen.  Has good BM, no blood in stool  Patient Active Problem List   Diagnosis Date Noted  . Osteoarthritis of left hip 02/15/2018  . 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 Rheumatoid arthritis involving multiple sites ([place]) 01/15/2018  . Tobacco dependence 01/15/2018  . Arthritis of left hip 01/15/2018  . Opioid use agreement exists 01/15/2018  . Positive depression screening 01/15/2018  . Homeless 01/15/2018     Current Outpatient Medications on File Prior to Visit  Medication Sig Dispense Refill  . traMADol (ULTRAM) 50 MG tablet Take by mouth every 6 (six) hours as needed. 1 tablet 2 times daily prn    . albuterol (PROVENTIL HFA;VENTOLIN HFA) 108 (90 Base) MCG/ACT inhaler Inhale 2 puffs into the lungs every 6 (six) hours as needed for wheezing or shortness of breath. 1 Inhaler 2  . aspirin EC 81 MG tablet Take 1 tablet (81 mg total) by mouth 2 (two) times daily. 60 tablet 0  . Cholecalciferol (VITAMIN D3) 75 MCG (3000 UT) TABS Take 3,000 Units by mouth daily.    Marland Kitchen FOLIC ACID PO Take 1 mg by mouth daily.     Marland Kitchen ibuprofen (ADVIL,MOTRIN) 600 MG tablet  Take 1 tablet (600 mg total) by mouth every 8 (eight) hours as needed. As needed for pain 30 tablet 1  . methotrexate (RHEUMATREX) 2.5 MG tablet Take 6 tabs PO once a wk (Patient taking differently: Take 15 mg by mouth once a week. Take 6 tabs PO once a wk) 24 tablet 1  . Multiple Vitamin (MULTIVITAMIN WITH MINERALS) TABS tablet Take 1 tablet by mouth daily.    . nicotine polacrilex (NICORETTE) 2 MG gum Take 1 each (2 mg total) by mouth as needed for smoking cessation. Max of no more than 30 pieces/24 hr (Patient not taking: Reported on 01/29/2018) 100 tablet 1  . oxyCODONE-acetaminophen (PERCOCET/ROXICET) 5-325 MG tablet Take 1 tablet by mouth every 4 (four) hours as needed for severe pain. 30 tablet 0  . predniSONE (DELTASONE) 1 MG tablet Take 1 tablet (1 mg total) by mouth daily with breakfast. 30 tablet 1  . tiZANidine (ZANAFLEX) 2 MG tablet Take 1 tablet (2 mg total) by mouth every 6 (six) hours as needed. 60 tablet 0   No current facility-administered medications on file prior to visit.     No Known Allergies  Social History   Socioeconomic History  . Marital status: Single    Spouse name: Not on file  . Number of children: 1  . Years of education: Not on  file  . Highest education level: Not on file  Occupational History  . Not on file  Social Needs  . Financial resource strain: Not on file  . Food insecurity:    Worry: Not on file    Inability: Not on file  . Transportation needs:    Medical: Not on file    Non-medical: Not on file  Tobacco Use  . Smoking status: Current Some Day Smoker    Types: Cigarettes  . Smokeless tobacco: Never Used  Substance and Sexual Activity  . Alcohol use: Yes    Comment: occasionally  . Drug use: Not Currently    Types: Cocaine    Comment: last used in 2004  . Sexual activity: Not on file  Lifestyle  . Physical activity:    Days per week: Not on file    Minutes per session: Not on file  . Stress: Not on file  Relationships  .  Social connections:    Talks on phone: Not on file    Gets together: Not on file    Attends religious service: Not on file    Active member of club or organization: Not on file    Attends meetings of clubs or organizations: Not on file    Relationship status: Not on file  . Intimate partner violence:    Fear of current or ex partner: Not on file    Emotionally abused: Not on file    Physically abused: Not on file    Forced sexual activity: Not on file  Other Topics Concern  . Not on file  Social History Narrative  . Not on file    Family History  Problem Relation Age of Onset  . Breast cancer Mother     Past Surgical History:  Procedure Laterality Date  . TOTAL HIP ARTHROPLASTY Left 02/15/2018   Procedure: TOTAL HIP ARTHROPLASTY ANTERIOR APPROACH;  Surgeon: [name], MD;  Location: [facility];  Service: Orthopedics;  Laterality: Left;  . WISDOM TOOTH EXTRACTION      ROS: Review of Systems Negative except as above PHYSICAL EXAM: BP 105/73   Pulse 70   Temp 97.6 F (36.4 C) (Oral)   Resp 16   Ht 5' 5.5" (1.664 m)   Wt 107 lb 9.6 oz (48.8 kg)   SpO2 96%   BMI 17.63 kg/m   Physical Exam  General appearance - alert, well appearing, and in no distress Mental status - normal mood, behavior, speech, dress, motor activity, and thought processes Breasts - breasts appear normal, no suspicious masses, no skin or nipple changes or axillary nodes Pelvic - CMA present for breast and pelvic: normal external genitalia, vulva, vagina, cervix, uterus and adnexa  ASSESSMENT AND PLAN: 1. Pap smear for cervical cancer screening - Cytology - PAP  2. Colon cancer screening Discussed colon cancer screening methods.  Patient prefer less invasive and is agreeable to doing the fit test. - Fecal occult blood, imunochemical([facility])  3. Rheumatoid arthritis involving multiple sites, unspecified rheumatoid factor presence ([place]) Keep appointment with rheumatologist later this  month. - ibuprofen (ADVIL,MOTRIN) 600 MG tablet; Take 1 tablet (600 mg total) by mouth every 8 (eight) hours as needed. As needed for pain  Dispense: 30 tablet; Refill: 1 - methotrexate (RHEUMATREX) 2.5 MG tablet; Take 6 tabs PO once a wk  Dispense: 24 tablet; Refill: 1 - predniSONE (DELTASONE) 1 MG tablet; Take 1 tablet (1 mg total) by mouth daily with breakfast.  Dispense: 30 tablet; Refill: 0  4.  Family history of breast cancer 5. Breast cancer screening Encourage her to get the mammogram done. - MM Digital Screening; Future  6. Need for Tdap vaccination Given    Patient was given the opportunity to ask questions.  Patient verbalized understanding of the plan and was able to repeat key elements of the plan.   Orders Placed This Encounter  Procedures  . Tdap vaccine greater than or equal to 7yo IM     Requested Prescriptions   Pending Prescriptions Disp Refills  . albuterol (PROVENTIL HFA;VENTOLIN HFA) 108 (90 Base) MCG/ACT inhaler 1 Inhaler 2    Sig: Inhale 2 puffs into the lungs every 6 (six) hours as needed for wheezing or shortness of breath.  Marland Kitchen ibuprofen (ADVIL,MOTRIN) 600 MG tablet 30 tablet 1    Sig: Take 1 tablet (600 mg total) by mouth every 8 (eight) hours as needed. As needed for pain  . methotrexate (RHEUMATREX) 2.5 MG tablet 24 tablet 1    Sig: Take 6 tabs PO once a wk  . predniSONE (DELTASONE) 1 MG tablet 30 tablet 1    Sig: Take 1 tablet (1 mg total) by mouth daily with breakfast.    No follow-ups on file.  Karle Plumber, MD, FACP

## 2018-03-29 LAB — CYTOLOGY - PAP
Candida vaginitis: NEGATIVE
Chlamydia: NEGATIVE
Diagnosis: NEGATIVE
HPV: NOT DETECTED
Neisseria Gonorrhea: NEGATIVE
Trichomonas: NEGATIVE

## 2018-04-01 ENCOUNTER — Telehealth: Payer: Self-pay

## 2018-04-01 NOTE — Telephone Encounter (Signed)
Contacted pt to go over pap results pt didn't answer lvm asking pt to give me a call at her earliest convenience  

## 2018-04-05 ENCOUNTER — Ambulatory Visit: Payer: Self-pay | Admitting: Rheumatology

## 2018-04-05 ENCOUNTER — Telehealth: Payer: Self-pay | Admitting: Internal Medicine

## 2018-04-05 NOTE — Telephone Encounter (Signed)
Pt called back for lab results, please follow up as soon as possible. Thanks

## 2018-04-06 ENCOUNTER — Ambulatory Visit: Payer: Self-pay | Admitting: Rheumatology

## 2018-04-06 NOTE — Telephone Encounter (Signed)
Returned pt call to go over lab results pt didn't answer lvm asking pt to give me a call at her earliest convenience

## 2018-04-07 NOTE — Telephone Encounter (Signed)
Pt called to request her Pap Smear results be left on her Voicemail if she does not answer, please follow up

## 2018-04-27 NOTE — Progress Notes (Signed)
Office Visit Note  Patient: [name]             Date of Birth: [birth year]           MRN: [id number]             PCP: Ladell Pier, MD Referring: Ladell Pier, MD Visit Date: 05/11/2018 Occupation: @GUAROCC @  Subjective:  Pain in multiple joints  History of Present Illness: [name] is a 58 y.o. female seen in consultation per request of her PCP.  According to patient she started having right knee joint discomfort about 4 years ago at the time she was seen by [name] orthopedics.  She states she had x-ray and cortisone injection which helped.  2 years ago she started having pain in her right shoulder at the time she was seen by orthopedics at [name] and rheumatologist at [name].  She states she was diagnosed with rheumatoid arthritis and was placed on methotrexate.  Due to not having insurance coverage she could not afford to get methotrexate and was on methotrexate off and on.  She states she completely lost her insurance [facility] by a nurse practitioner who was giving her methotrexate.  She also had her labs monitored.  She states she could only afford methotrexate intermittently as she did not have insurance.  Over time she continued to have discomfort in her both shoulders her left hip and both hands.  She states she got Medicaid and 2019 and started seeing her PCP in October 2019.  She has been on methotrexate since then and had been getting her labs monitored.  She underwent left total hip replacement by Dr. Mayer Camel in December 2019 she states since then she has been walking much better.  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 [name].  She also has discomfort in her left shoulder.  She continues to have some discomfort in her bilateral hands.  She has been also experiencing lot of fatigue.  She has occasional discomfort in her right elbow.  None of the other joints are  painful.  Activities of Daily Living:  Patient reports morning stiffness for 1 hour.   Patient Reports nocturnal pain.  Difficulty dressing/grooming: Denies Difficulty climbing stairs: Denies Difficulty getting out of chair: Denies Difficulty using hands for taps, buttons, cutlery, and/or writing: Reports  Review of Systems  Constitutional: Positive for fatigue.  HENT: Positive for mouth dryness. Negative for mouth sores and nose dryness.   Eyes: Negative for pain, visual disturbance and dryness.  Respiratory: Negative for cough, hemoptysis, shortness of breath and difficulty breathing.   Cardiovascular: Negative for chest pain, palpitations, hypertension and swelling in legs/feet.  Gastrointestinal: Positive for diarrhea (SE of MTX ). Negative for blood in stool and constipation.  Endocrine: Negative for increased urination.  Genitourinary: Negative for painful urination.  Musculoskeletal: Positive for arthralgias, joint pain, joint swelling and morning stiffness. Negative for myalgias, muscle weakness, muscle tenderness and myalgias.  Skin: Negative for color change, pallor, rash, hair loss, nodules/bumps, skin tightness, ulcers and sensitivity to sunlight.  Allergic/Immunologic: Negative for susceptible to infections.  Neurological: Negative for dizziness, numbness, headaches and weakness.  Hematological: Negative for swollen glands.  Psychiatric/Behavioral: Positive for depressed mood. Negative for sleep disturbance. The patient is nervous/anxious.     PMFS History:  Patient Active Problem List   Diagnosis Date Noted  . Family history of  breast cancer 03/25/2018  . Osteoarthritis of left hip 02/15/2018  . 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 Rheumatoid arthritis involving multiple sites ([name]) 01/15/2018  . Tobacco dependence 01/15/2018  . Arthritis of left hip 01/15/2018  . Opioid use agreement exists 01/15/2018  . Positive depression screening 01/15/2018  . Homeless 01/15/2018     Past Medical History:  Diagnosis Date  . Anxiety   . Bronchitis age 14  . Depression   . Hx of bladder infections    been over 5 years since last one   . Rheumatoid arthritis ([name])    unc rheum   . Tobacco abuse   . Trouble in sleeping     Family History  Problem Relation Age of Onset  . Breast cancer Mother   . Cervical cancer Mother   . Cancer Brother   . Healthy Son    Past Surgical History:  Procedure Laterality Date  . TOTAL HIP ARTHROPLASTY Left 02/15/2018   Procedure: TOTAL HIP ARTHROPLASTY ANTERIOR APPROACH;  Surgeon: [name], MD;  Location: [facility];  Service: Orthopedics;  Laterality: Left;  . WISDOM TOOTH EXTRACTION     Social History   Social History Narrative  . Not on file   Immunization History  Administered Date(s) Administered  . Tdap 03/25/2018     Objective: Vital Signs: BP 120/66 (BP Location: Right Arm, Patient Position: Sitting, Cuff Size: Normal)   Pulse 82   Resp 14   Ht 5' 5.25" (1.657 m)   Wt 109 lb (49.4 kg)   BMI 18.00 kg/m    Physical Exam Vitals signs and nursing note reviewed.  Constitutional:      Appearance: She is well-developed.  HENT:     Head: Normocephalic and atraumatic.  Eyes:     Conjunctiva/sclera: Conjunctivae normal.  Neck:     Musculoskeletal: Normal range of motion.  Cardiovascular:     Rate and Rhythm: Normal rate and regular rhythm.     Heart sounds: Normal heart sounds.  Pulmonary:     Effort: Pulmonary effort is normal.     Breath sounds: Normal breath sounds.  Abdominal:     General: Bowel sounds are normal.     Palpations: Abdomen is soft.  Lymphadenopathy:     Cervical: No cervical adenopathy.  Skin:    General: Skin is warm and dry.     Capillary Refill: Capillary refill takes less than 2 seconds.  Neurological:     Mental Status: She is alert and oriented to person, place, and time.  Psychiatric:        Behavior: Behavior normal.      Musculoskeletal Exam: C-spine some limitation  with lateral rotation.  Shoulder bilateral shoulder joint abduction was limited to 90 degrees.  She has about 10 degrees contracture in her left elbow.  She has limited range of motion of bilateral wrist joints without any synovitis.  She has thickening of bilateral MCP joints with no synovitis.  Some ulnar deviation was noted.  MCP thickening was noted bilaterally.  Left hip joint which is replaced has very limited range of motion.  Knee joints ankles MTPs PIPs been good range of motion.  CDAI Exam: CDAI Score: 5.3  Patient Global Assessment: 8 (mm); Provider Global Assessment: 5 (mm) Swollen: 2 ; Tender: 3  Joint Exam      Right  Left  Glenohumeral   Tender   Tender  MCP 2  Swollen   Swollen   Hip      Tender  Investigation: No additional findings.  Imaging: Xr Foot 2 Views Left  Result Date: 05/11/2018 First MTP PIP and DIP narrowing was noted.  Fifth MTP narrowing with severe erosive changes were noted.  No intertarsal joint space narrowing was noted.  Small calcaneal spur was noted. Impression: These findings are consistent with severe erosive rheumatoid arthritis.  Xr Foot 2 Views Right  Result Date: 05/11/2018 PIP DIP and MCP joint narrowing with juxta-articular osteopenia was noted.  Severe erosive changes were noted in first, third, fourth and fifth MTP joints.  No significant intertarsal joint space narrowing was noted.  Possible erosion was noted in the tarsal bone. Impression: These findings are consistent with severe erosive rheumatoid arthritis.  Xr Hand 2 View Left  Result Date: 05/11/2018 Juxta-articular osteopenia was noted.  Second and third MCP joint narrowing was noted.  Severe erosive changes were noted in the second and third MCP joint.  Intercarpal radiocarpal joint space narrowing with erosive changes in carpal bones and radius was noted. Impression: These findings are consistent with severe erosive rheumatoid arthritis  Xr Hand 2 View Right  Result Date:  05/11/2018 Severe second MCP narrowing and subluxation.  Juxta-articular osteopenia was noted.  PIP and DIP narrowing was noted.  Radiocarpal, intercarpal and metacarpocarpal narrowing was noted.  Erosive changes were noted in the first and second MCP joint.  Erosive changes were noted in the carpal bones ulnar styloid and metacarpal bases. Impression: These findings are consistent with erosive rheumatoid arthritis.   Recent Labs: Lab Results  Component Value Date   WBC 5.7 02/17/2018   HGB 8.2 (L) 02/17/2018   PLT 133 (L) 02/17/2018   NA 133 (L) 02/16/2018   K 4.6 02/16/2018   CL 105 02/16/2018   CO2 25 02/16/2018   GLUCOSE 154 (H) 02/16/2018   BUN 14 02/16/2018   CREATININE 0.49 02/16/2018   BILITOT 0.3 01/15/2018   ALKPHOS 87 01/15/2018   AST 12 01/15/2018   ALT 12 01/15/2018   PROT 6.8 01/15/2018   ALBUMIN 4.3 01/15/2018   CALCIUM 8.2 (L) 02/16/2018   GFRAA >60 02/16/2018  .11/08/  2019 HIV negative, hepatitis C negative, RF 102.1  Speciality Comments: No specialty comments available.  Procedures:  No procedures performed Allergies: Patient has no known allergies.   Assessment / Plan:     Visit Diagnoses: Rheumatoid arthritis involving multiple sites with positive rheumatoid factor ([place]) -patient has known history of rheumatoid arthritis for last few years.  She has had intermittent methotrexate due to financial difficulty and lack of insurance.  She has been on methotrexate on regular basis since October 2019 by her PCP.  She had synovial thickening but not much synovitis on examination.  She has contracture in her joints.  Bilateral shoulder joint abduction is limited.  Plan: Urinalysis, Routine w reflex microscopic, Sedimentation rate, Cyclic citrul peptide antibody, IgG, ANA  High risk medication use - Methotrexate 6 tablets p.o. weekly, folic acid 1 mg p.o. daily, prednisone 1 mg p.o. daily -detailed counseling guarding rheumatoid arthritis was provided.  We will obtain  labs today.  A handout was given and consent was taken on methotrexate.  Plan: CBC with Differential/Platelet, COMPLETE METABOLIC PANEL WITH GFR, Hepatitis B core antibody, IgM, Hepatitis B surface antigen, Hepatitis C antibody, QuantiFERON-TB Gold Plus, Serum protein electrophoresis with reflex, IgG, IgA, IgM  Chronic pain of both shoulders-she has pain in bilateral shoulders and has been followed by Dr. [name].  She has limited range of motion of bilateral shoulders with abduction  up to 90 degrees..  I do not have x-rays available today.  Advised her to bring x-rays next visit.  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  Pain in both hands -she has synovial thickening and ulnar deviation.  Plan: XR Hand 2 View Right, XR Hand 2 View Left.  The x-rays were consistent with severe erosive rheumatoid arthritis.  Pain in both feet -she had discomfort in her feet but no synovitis.  Plan: XR Foot 2 Views Right, XR Foot 2 Views Left.  X-rays were consistent with severe erosive rheumatoid arthritis.  Status post total hip replacement, left-done by Dr. Mayer Camel in December 2019.  She has limited range of motion of her left hip joint.  Smoker-patient states she quit smoking and has been using nicotine gum.  Anxiety and depression  Homeless  Family history of breast cancer  Other fatigue - Plan: CK, TSH   Orders: Orders Placed This Encounter  Procedures  . XR Hand 2 View Right  . XR Hand 2 View Left  . XR Foot 2 Views Right  . XR Foot 2 Views Left  . CBC with Differential/Platelet  . COMPLETE METABOLIC PANEL WITH GFR  . Urinalysis, Routine w reflex microscopic  . CK  . TSH  . Sedimentation rate  . Cyclic citrul peptide antibody, IgG  . Hepatitis B core antibody, IgM  . Hepatitis B surface antigen  . Hepatitis C antibody  . QuantiFERON-TB Gold Plus  . Serum protein electrophoresis with reflex  . IgG, IgA, IgM  . ANA   No orders of the defined types were  placed in this encounter.   Face-to-face time spent with patient was 50 minutes. Greater than 50% of time was spent in counseling and coordination of care.  Follow-Up Instructions: Return for Rheumatoid arthritis.   Bo Merino, MD  Note - This record has been created using Editor, commissioning.  Chart creation errors have been sought, but may not always  have been located. Such creation errors do not reflect on  the standard of medical care.

## 2018-05-11 ENCOUNTER — Ambulatory Visit (INDEPENDENT_AMBULATORY_CARE_PROVIDER_SITE_OTHER): Payer: Self-pay

## 2018-05-11 ENCOUNTER — Ambulatory Visit: Payer: Medicaid Other | Admitting: Rheumatology

## 2018-05-11 ENCOUNTER — Ambulatory Visit (INDEPENDENT_AMBULATORY_CARE_PROVIDER_SITE_OTHER): Payer: Medicaid Other

## 2018-05-11 ENCOUNTER — Encounter: Payer: Self-pay | Admitting: Rheumatology

## 2018-05-11 ENCOUNTER — Encounter (INDEPENDENT_AMBULATORY_CARE_PROVIDER_SITE_OTHER): Payer: Self-pay

## 2018-05-11 ENCOUNTER — Telehealth: Payer: Self-pay | Admitting: Pharmacist

## 2018-05-11 VITALS — BP 120/66 | HR 82 | Resp 14 | Ht 65.25 in | Wt 109.0 lb

## 2018-05-11 DIAGNOSIS — M25511 Pain in right shoulder: Secondary | ICD-10-CM

## 2018-05-11 DIAGNOSIS — M79672 Pain in left foot: Secondary | ICD-10-CM | POA: Diagnosis not present

## 2018-05-11 DIAGNOSIS — M79641 Pain in right hand: Secondary | ICD-10-CM | POA: Diagnosis not present

## 2018-05-11 DIAGNOSIS — F17200 Nicotine dependence, unspecified, uncomplicated: Secondary | ICD-10-CM

## 2018-05-11 DIAGNOSIS — Z79899 Other long term (current) drug therapy: Secondary | ICD-10-CM | POA: Diagnosis not present

## 2018-05-11 DIAGNOSIS — Z5900 Homelessness unspecified: Secondary | ICD-10-CM

## 2018-05-11 DIAGNOSIS — M79671 Pain in right foot: Secondary | ICD-10-CM | POA: Diagnosis not present

## 2018-05-11 DIAGNOSIS — G8929 Other chronic pain: Secondary | ICD-10-CM

## 2018-05-11 DIAGNOSIS — M24522 Contracture, left elbow: Secondary | ICD-10-CM

## 2018-05-11 DIAGNOSIS — M79642 Pain in left hand: Secondary | ICD-10-CM

## 2018-05-11 DIAGNOSIS — Z803 Family history of malignant neoplasm of breast: Secondary | ICD-10-CM

## 2018-05-11 DIAGNOSIS — F329 Major depressive disorder, single episode, unspecified: Secondary | ICD-10-CM

## 2018-05-11 DIAGNOSIS — F32A Depression, unspecified: Secondary | ICD-10-CM

## 2018-05-11 DIAGNOSIS — M25512 Pain in left shoulder: Secondary | ICD-10-CM

## 2018-05-11 DIAGNOSIS — M0579 Rheumatoid arthritis with rheumatoid factor of multiple sites without organ or systems involvement: Secondary | ICD-10-CM | POA: Diagnosis not present

## 2018-05-11 DIAGNOSIS — F419 Anxiety disorder, unspecified: Secondary | ICD-10-CM

## 2018-05-11 DIAGNOSIS — R5383 Other fatigue: Secondary | ICD-10-CM

## 2018-05-11 DIAGNOSIS — Z96642 Presence of left artificial hip joint: Secondary | ICD-10-CM

## 2018-05-11 NOTE — Patient Instructions (Addendum)
Standing Labs We placed an order today for your standing lab work.    Please come back and get your standing labs in June and every 3 months  We have open lab Monday through Friday from 8:30-11:30 AM and 1:30-4:00 PM  at the office of Dr. Bo Merino.   You may experience shorter wait times on Monday and Friday afternoons. The office is located at [address], [place], [place], [place] [address] No appointment is necessary.   Labs are drawn by [facility].  You may receive a bill from [facility] for your lab work.  If you wish to have your labs drawn at another location, please call the office 24 hours in advance to send orders.  If you have any questions regarding directions or hours of operation,  please call [phone].   Just as a reminder please drink plenty of water prior to coming for your lab work. Thanks!  Vaccines You are taking a medication(s) that can suppress your immune system.  The following immunizations are recommended: . Flu annually . Pneumonia (Pneumovax 23 and Prevnar 13 spaced at least 1 year apart) . Shingrix  Please check with your PCP to make sure you are up to date.    Methotrexate tablets What is this medicine? METHOTREXATE (METH oh TREX ate) is a chemotherapy drug used to treat cancer including breast cancer, leukemia, and lymphoma. This medicine can also be used to treat psoriasis and certain kinds of arthritis. This medicine may be used for other purposes; ask your health care provider or pharmacist if you have questions. COMMON BRAND NAME(S): Rheumatrex, Trexall What should I tell my health care provider before I take this medicine? They need to know if you have any of these conditions: -fluid in the stomach area or lungs -if you often drink alcohol -infection or immune system problems -kidney disease or on hemodialysis -liver disease -low blood counts, like low white cell, platelet, or red cell counts -lung disease -radiation  therapy -stomach ulcers -ulcerative colitis -an unusual or allergic reaction to methotrexate, other medicines, foods, dyes, or preservatives -pregnant or trying to get pregnant -breast-feeding How should I use this medicine? Take this medicine by mouth with a glass of water. Follow the directions on the prescription label. Take your medicine at regular intervals. Do not take it more often than directed. Do not stop taking except on your doctor's advice. Make sure you know why you are taking this medicine and how often you should take it. If this medicine is used for a condition that is not cancer, like arthritis or psoriasis, it should be taken weekly, NOT daily. Taking this medicine more often than directed can cause serious side effects, even death. Talk to your healthcare provider about safe handling and disposal of this medicine. You may need to take special precautions. Talk to your pediatrician regarding the use of this medicine in children. While this drug may be prescribed for selected conditions, precautions do apply. Overdosage: If you think you have taken too much of this medicine contact a poison control center or emergency room at once. NOTE: This medicine is only for you. Do not share this medicine with others. What if I miss a dose? If you miss a dose, talk with your doctor or health care professional. Do not take double or extra doses. What may interact with this medicine? This medicine may interact with the following medication: -acitretin -aspirin and aspirin-like medicines including salicylates -azathioprine -certain antibiotics like penicillins, tetracycline, and chloramphenicol -cyclosporine -  gold -hydroxychloroquine -live virus vaccines -NSAIDs, medicines for pain and inflammation, like ibuprofen or naproxen -other cytotoxic agents -penicillamine -phenylbutazone -phenytoin -probenecid -retinoids such as isotretinoin and tretinoin -steroid medicines like  prednisone or cortisone -sulfonamides like sulfasalazine and trimethoprim/sulfamethoxazole -theophylline This list may not describe all possible interactions. Give your health care provider a list of all the medicines, herbs, non-prescription drugs, or dietary supplements you use. Also tell them if you smoke, drink alcohol, or use illegal drugs. Some items may interact with your medicine. What should I watch for while using this medicine? Avoid alcoholic drinks. This medicine can make you more sensitive to the sun. Keep out of the sun. If you cannot avoid being in the sun, wear protective clothing and use sunscreen. Do not use sun lamps or tanning beds/booths. You may need blood work done while you are taking this medicine. Call your doctor or health care professional for advice if you get a fever, chills or sore throat, or other symptoms of a cold or flu. Do not treat yourself. This drug decreases your body's ability to fight infections. Try to avoid being around people who are sick. This medicine may increase your risk to bruise or bleed. Call your doctor or health care professional if you notice any unusual bleeding. Check with your doctor or health care professional if you get an attack of severe diarrhea, nausea and vomiting, or if you sweat a lot. The loss of too much body fluid can make it dangerous for you to take this medicine. Talk to your doctor about your risk of cancer. You may be more at risk for certain types of cancers if you take this medicine. Both men and women must use effective birth control with this medicine. Do not become pregnant while taking this medicine or until at least 1 normal menstrual cycle has occurred after stopping it. Women should inform their doctor if they wish to become pregnant or think they might be pregnant. Men should not father a child while taking this medicine and for 3 months after stopping it. There is a potential for serious side effects to an unborn  child. Talk to your health care professional or pharmacist for more information. Do not breast-feed an infant while taking this medicine. What side effects may I notice from receiving this medicine? Side effects that you should report to your doctor or health care professional as soon as possible: -allergic reactions like skin rash, itching or hives, swelling of the face, lips, or tongue -breathing problems or shortness of breath -diarrhea -dry, nonproductive cough -low blood counts - this medicine may decrease the number of white blood cells, red blood cells and platelets. You may be at increased risk for infections and bleeding. -mouth sores -redness, blistering, peeling or loosening of the skin, including inside the mouth -signs of infection - fever or chills, cough, sore throat, pain or trouble passing urine -signs and symptoms of bleeding such as bloody or black, tarry stools; red or dark-brown urine; spitting up blood or brown material that looks like coffee grounds; red spots on the skin; unusual bruising or bleeding from the eye, gums, or nose -signs and symptoms of kidney injury like trouble passing urine or change in the amount of urine -signs and symptoms of liver injury like dark yellow or brown urine; general ill feeling or flu-like symptoms; light-colored stools; loss of appetite; nausea; right upper belly pain; unusually weak or tired; yellowing of the eyes or skin Side effects that usually do not require  medical attention (report to your doctor or health care professional if they continue or are bothersome): -dizziness -hair loss -tiredness -upset stomach -vomiting This list may not describe all possible side effects. Call your doctor for medical advice about side effects. You may report side effects to FDA at 1-800-FDA-1088. Where should I keep my medicine? Keep out of the reach of children. Store at room temperature between 20 and 25 degrees C (68 and 77 degrees F). Protect  from light. Throw away any unused medicine after the expiration date. NOTE: This sheet is a summary. It may not cover all possible information. If you have questions about this medicine, talk to your doctor, pharmacist, or health care provider.  2019 Elsevier/Gold Standard (2016-10-16 13:38:43)

## 2018-05-11 NOTE — Progress Notes (Signed)
Pharmacy Note  Subjective: Patient presents today to the [facility] to see Dr. Estanislado Pandy.  She is transferring her care from another rheumatologist.  Patient seen by the pharmacist for counseling on methotrexate for rheumatoid arthritis. She is currently on methotrexate 6 tablets weekly and folic acid 1 mg daily.  Objective: CBC    Component Value Date/Time   WBC 5.7 02/17/2018 0326   RBC 2.83 (L) 02/17/2018 0326   HGB 8.2 (L) 02/17/2018 0326   HGB 12.8 01/15/2018 1509   HCT 25.9 (L) 02/17/2018 0326   HCT 38.3 01/15/2018 1509   PLT 133 (L) 02/17/2018 0326   PLT 311 01/15/2018 1509   MCV 91.5 02/17/2018 0326   MCV 86 01/15/2018 1509   MCH 29.0 02/17/2018 0326   MCHC 31.7 02/17/2018 0326   RDW 14.5 02/17/2018 0326   RDW 14.2 01/15/2018 1509   LYMPHSABS 1.9 02/08/2018 1539   MONOABS 0.4 02/08/2018 1539   EOSABS 0.1 02/08/2018 1539   BASOSABS 0.0 02/08/2018 1539    CMP     Component Value Date/Time   NA 133 (L) 02/16/2018 0328   NA 142 01/15/2018 1509   K 4.6 02/16/2018 0328   CL 105 02/16/2018 0328   CO2 25 02/16/2018 0328   GLUCOSE 154 (H) 02/16/2018 0328   BUN 14 02/16/2018 0328   BUN 16 01/15/2018 1509   CREATININE 0.49 02/16/2018 0328   CALCIUM 8.2 (L) 02/16/2018 0328   PROT 6.8 01/15/2018 1509   ALBUMIN 4.3 01/15/2018 1509   AST 12 01/15/2018 1509   ALT 12 01/15/2018 1509   ALKPHOS 87 01/15/2018 1509   BILITOT 0.3 01/15/2018 1509   GFRNONAA >60 02/16/2018 0328   GFRAA >60 02/16/2018 0328    Baseline Immunosuppressant Therapy Labs TB gold: pending 05/11/2018 Hepatitis panel:pending 05/11/2018 HIV: pending 05/11/2018 Immunoglobulins:pending 05/11/2018 SPEP:pending 05/11/2018  Chest-xray:  No evidence of acute abnormality 02/09/18  Contraception: post-menopausal  Alcohol use: 1 to 2 glasses of wine every few weeks  Assessment/Plan:   Patient was counseled on the purpose, proper use, and adverse effects of methotrexate including nausea, infection,  and signs and symptoms of pneumonitis. Discussed that there is the possibility of an increased risk of malignancy, specifically lymphomas, but it is not well understood if this increased risk is due to the medication or the disease state.  Instructed patient that medication should be held for infection and prior to surgery.  Advised patient to avoid live vaccines. Recommend annual influenza, Pneumovax 23, Prevnar 13, and Shingrix as indicated. Patient states she received her flu shot for this season.  Reviewed instructions with patient to take methotrexate weekly along with folic acid daily.  Discussed the importance of frequent monitoring of kidney and liver function and blood counts, and provided patient with standing lab instructions.  Counseled patient to avoid NSAIDs and alcohol while on methotrexate.  Provided patient with educational materials on methotrexate and answered all questions.   Patient voiced understanding.  Patient consented to methotrexate use.  Will upload into chart.    She will continue her current dose of methotrexate 6 tablets weekly and folic acid 1 mg daily. New prescription pending lab results.  All questions encouraged and answered.  Instructed patient to call with any further questions or concerns.  Mariella Saa, PharmD, [facility] Rheumatology Clinical Pharmacist  05/11/2018 10:16 AM

## 2018-05-11 NOTE — Telephone Encounter (Signed)
She will continue her current dose of methotrexate 6 tablets weekly and folic acid 1 mg daily. New prescription pending lab results.

## 2018-05-13 NOTE — Progress Notes (Signed)
Labs are consistent with rheumatoid arthritis.

## 2018-05-14 NOTE — Progress Notes (Signed)
IFE pending

## 2018-05-17 MED ORDER — FOLIC ACID 1 MG PO TABS: 1.0000 mg | ORAL_TABLET | Freq: Every day | ORAL | 0 refills | Status: DC

## 2018-05-17 MED ORDER — METHOTREXATE 2.5 MG PO TABS: 15.0000 mg | ORAL_TABLET | ORAL | 0 refills | Status: DC

## 2018-05-17 NOTE — Telephone Encounter (Signed)
Patient advised of lab results. Prescription for MTX and Folic acid sent to the pharmacy. Patient states she was prescribed prednisone from her previous rheumatologist. Patient is requesting to have a prescription called to the pharmacy for prednisone. She said she is having pain in her arm and is unable to straighten it. Please advise.

## 2018-05-18 LAB — CBC WITH DIFFERENTIAL/PLATELET
Absolute Monocytes: 471 cells/uL (ref 200–950)
Basophils Absolute: 38 cells/uL (ref 0–200)
Basophils Relative: 0.5 %
Eosinophils Absolute: 122 cells/uL (ref 15–500)
Eosinophils Relative: 1.6 %
HCT: 46.6 % — ABNORMAL HIGH (ref 35.0–45.0)
HEMOGLOBIN: 14.6 g/dL (ref 11.7–15.5)
Lymphs Abs: 1596 cells/uL (ref 850–3900)
MCH: 26.6 pg — ABNORMAL LOW (ref 27.0–33.0)
MCHC: 31.3 g/dL — ABNORMAL LOW (ref 32.0–36.0)
MCV: 84.9 fL (ref 80.0–100.0)
MPV: 9.8 fL (ref 7.5–12.5)
Monocytes Relative: 6.2 %
Neutro Abs: 5373 cells/uL (ref 1500–7800)
Neutrophils Relative %: 70.7 %
Platelets: 288 10*3/uL (ref 140–400)
RBC: 5.49 10*6/uL — ABNORMAL HIGH (ref 3.80–5.10)
RDW: 13.3 % (ref 11.0–15.0)
Total Lymphocyte: 21 %
WBC: 7.6 10*3/uL (ref 3.8–10.8)

## 2018-05-18 LAB — URINALYSIS, ROUTINE W REFLEX MICROSCOPIC
Bilirubin Urine: NEGATIVE
GLUCOSE, UA: NEGATIVE
Hgb urine dipstick: NEGATIVE
Ketones, ur: NEGATIVE
Leukocytes,Ua: NEGATIVE
Nitrite: NEGATIVE
Protein, ur: NEGATIVE
Specific Gravity, Urine: 1.015 (ref 1.001–1.03)
pH: <=5 (ref 5.0–8.0)

## 2018-05-18 LAB — PROTEIN ELECTROPHORESIS, SERUM, WITH REFLEX
Albumin ELP: 4.1 g/dL (ref 3.8–4.8)
Alpha 1: 0.4 g/dL — ABNORMAL HIGH (ref 0.2–0.3)
Alpha 2: 0.8 g/dL (ref 0.5–0.9)
Beta 2: 0.4 g/dL (ref 0.2–0.5)
Beta Globulin: 0.5 g/dL (ref 0.4–0.6)
Gamma Globulin: 0.8 g/dL (ref 0.8–1.7)
Total Protein: 7 g/dL (ref 6.1–8.1)

## 2018-05-18 LAB — SEDIMENTATION RATE: Sed Rate: 22 mm/h (ref 0–30)

## 2018-05-18 LAB — COMPLETE METABOLIC PANEL WITH GFR
AG Ratio: 1.5 (calc) (ref 1.0–2.5)
ALT: 14 U/L (ref 6–29)
AST: 18 U/L (ref 10–35)
Albumin: 4.3 g/dL (ref 3.6–5.1)
Alkaline phosphatase (APISO): 94 U/L (ref 37–153)
BUN: 13 mg/dL (ref 7–25)
CALCIUM: 9.4 mg/dL (ref 8.6–10.4)
CO2: 28 mmol/L (ref 20–32)
Chloride: 101 mmol/L (ref 98–110)
Creat: 0.96 mg/dL (ref 0.50–1.05)
GFR, EST AFRICAN AMERICAN: 75 mL/min/{1.73_m2} (ref >=60)
GFR, EST NON AFRICAN AMERICAN: 65 mL/min/{1.73_m2} (ref >=60)
Globulin: 2.8 g/dL (calc) (ref 1.9–3.7)
Glucose, Bld: 94 mg/dL (ref 65–99)
Potassium: 4.4 mmol/L (ref 3.5–5.3)
Sodium: 139 mmol/L (ref 135–146)
Total Bilirubin: 0.4 mg/dL (ref 0.2–1.2)
Total Protein: 7.1 g/dL (ref 6.1–8.1)

## 2018-05-18 LAB — QUANTIFERON-TB GOLD PLUS
Mitogen-NIL: >10 IU/mL
NIL: 0.08 IU/mL
QuantiFERON-TB Gold Plus: NEGATIVE
TB1-NIL: 0 IU/mL
TB2-NIL: 0 IU/mL

## 2018-05-18 LAB — IFE INTERPRETATION: Immunofix Electr Int: NOT DETECTED

## 2018-05-18 LAB — CYCLIC CITRUL PEPTIDE ANTIBODY, IGG: Cyclic Citrullin Peptide Ab: >250 UNITS — ABNORMAL HIGH

## 2018-05-18 LAB — IGG, IGA, IGM
IgG (Immunoglobin G), Serum: 786 mg/dL (ref 600–1640)
IgM, Serum: 178 mg/dL (ref 50–300)
Immunoglobulin A: 231 mg/dL (ref 47–310)

## 2018-05-18 LAB — CK: Total CK: 29 U/L (ref 29–143)

## 2018-05-18 LAB — HEPATITIS C ANTIBODY
HEP C AB: NONREACTIVE
SIGNAL TO CUT-OFF: 0.02 (ref <1.00)

## 2018-05-18 LAB — TSH: TSH: 0.99 m[IU]/L (ref 0.40–4.50)

## 2018-05-18 LAB — HEPATITIS B SURFACE ANTIGEN: Hepatitis B Surface Ag: NONREACTIVE

## 2018-05-18 LAB — ANA: Anti Nuclear Antibody(ANA): NEGATIVE

## 2018-05-18 LAB — HEPATITIS B CORE ANTIBODY, IGM: Hep B C IgM: NONREACTIVE

## 2018-05-18 MED ORDER — PREDNISONE 5 MG PO TABS: ORAL_TABLET | ORAL | 0 refills | Status: DC

## 2018-05-18 NOTE — Telephone Encounter (Signed)
She has not been taking methotrexate on regular basis.  Please advise her to take methotrexate on regular basis.  I will give her prednisone taper 1 time a starting at 20 mg and taper by 5 mg every 4 days.

## 2018-05-18 NOTE — Telephone Encounter (Signed)
Patient advised to take her methotrexate on regular basis.  Prescription for prednisone taper sent to the pharmacy. Patient advised it is a one time taper. Patient verbalized understanding.

## 2018-05-26 ENCOUNTER — Telehealth: Payer: Self-pay | Admitting: Pharmacist

## 2018-05-26 NOTE — Telephone Encounter (Signed)
Courtesy call to follow up about starting methotrexate.  Left voicemail and also reminded she is due for labs.

## 2018-06-01 ENCOUNTER — Telehealth: Payer: Self-pay | Admitting: Rheumatology

## 2018-06-01 NOTE — Telephone Encounter (Signed)
Patient called requesting a return call regarding her prescription of Methotrexate.  Patient states the prescription is expensive and is asking if there is something else she can take.

## 2018-06-01 NOTE — Telephone Encounter (Signed)
Patient advised Regarding your medications we recommend you continue them at this time as prescribed. In the event you do become sick please follow recommended procedure to hold medication until symptoms have resolved.  We understand concerns about increased risk of infection. If you do choose to stop your medications understand you are putting yourself at risk for a flare. Please call the office for any questions or acute issues. We are allowing some flexibly for labs and follow up visit. If you are having current respiratory symptoms do not come to the office, please utilize e-visits and your PCP for appropriate assessment and testing. Patient will get her medication this weekend.

## 2018-06-08 DIAGNOSIS — F17200 Nicotine dependence, unspecified, uncomplicated: Secondary | ICD-10-CM | POA: Insufficient documentation

## 2018-06-08 DIAGNOSIS — M05741 Rheumatoid arthritis with rheumatoid factor of right hand without organ or systems involvement: Secondary | ICD-10-CM | POA: Insufficient documentation

## 2018-06-08 DIAGNOSIS — F329 Major depressive disorder, single episode, unspecified: Secondary | ICD-10-CM | POA: Insufficient documentation

## 2018-06-08 DIAGNOSIS — M05771 Rheumatoid arthritis with rheumatoid factor of right ankle and foot without organ or systems involvement: Secondary | ICD-10-CM | POA: Insufficient documentation

## 2018-06-08 DIAGNOSIS — M19019 Primary osteoarthritis, unspecified shoulder: Secondary | ICD-10-CM | POA: Insufficient documentation

## 2018-06-08 DIAGNOSIS — M24522 Contracture, left elbow: Secondary | ICD-10-CM | POA: Insufficient documentation

## 2018-06-08 DIAGNOSIS — Z96642 Presence of left artificial hip joint: Secondary | ICD-10-CM | POA: Insufficient documentation

## 2018-06-08 DIAGNOSIS — M05742 Rheumatoid arthritis with rheumatoid factor of left hand without organ or systems involvement: Secondary | ICD-10-CM

## 2018-06-08 DIAGNOSIS — F419 Anxiety disorder, unspecified: Secondary | ICD-10-CM

## 2018-06-08 DIAGNOSIS — M05772 Rheumatoid arthritis with rheumatoid factor of left ankle and foot without organ or systems involvement: Secondary | ICD-10-CM

## 2018-06-08 NOTE — Progress Notes (Signed)
May 11, 2018 CBC, CMP, UA negative, CK 29, TSH normal, IFE negative, hepatitis B-, hepatitis C negative, immunoglobulins normal, TB Gold negative ANA negative, anti-CCP> 250, ESR 22 January 15, 2018 rheumatoid factor I 02.1, HIV negative

## 2018-06-09 NOTE — Progress Notes (Signed)
Virtual Visit via Telephone Note  I connected with [name] on 06/09/18 at 10:30 AM EDT by telephone and verified that I am speaking with the correct person using two identifiers.   I discussed the limitations, risks, security and privacy concerns of performing an evaluation and management service by telephone and the availability of in person appointments. I also discussed with the patient that there may be a patient responsible charge related to this service. The patient expressed understanding and agreed to proceed.  CC: Right wrist pain and swelling  History of Present Illness: Patient is a 58 year old female with a past medical history of seropositive rheumatoid arthritis.  She is on MTX 6 tablets po once weekly and folic acid 2 mg po daily prescribed by her PCP.  She is apprehensive to stay on MTX due to risk of infection during the coronavirus pandemic.  She continues to have morning stiffness lasting 1 hour or more most days.  She has some difficulty using silverware and writing.  She has chronic pain in both hands.  She is having swelling in the right wrist joint.  She has swelling in the 2nd MCP joint.    Review of Systems  Constitutional: Positive for malaise/fatigue. Negative for fever.  Eyes: Negative for photophobia, pain, discharge and redness.  Respiratory: Negative for cough, shortness of breath and wheezing.   Cardiovascular: Negative for chest pain and palpitations.  Gastrointestinal: Negative for blood in stool, constipation and diarrhea.  Genitourinary: Negative for dysuria.  Musculoskeletal: Positive for joint pain. Negative for back pain, myalgias and neck pain.  Skin: Negative for rash.  Neurological: Negative for dizziness and headaches.  Psychiatric/Behavioral: Negative for depression. The patient is not nervous/anxious and does not have insomnia.    Observations/Objective: Physical Exam  Constitutional: She is oriented to person, place, and time.   Neurological: She is alert and oriented to person, place, and time.  Psychiatric: Mood, memory, affect and judgment normal.   Patient reports morning stiffness for 1  hour.   Patient reports nocturnal pain.  Difficulty dressing/grooming: Denies Difficulty climbing stairs: Denies Difficulty getting out of chair: Denies Difficulty using hands for taps, buttons, cutlery, and/or writing: Reports  Findings:  May 11, 2018 CBC, CMP, UA negative, CK 29, TSH normal, IFE negative, hepatitis B-, hepatitis C negative, immunoglobulins normal, TB Gold negative ANA negative, anti-CCP> 250, ESR 22 January 15, 2018 rheumatoid factor I 02.1, HIV negative  Assessment and Plan: Rheumatoid arthritis involving multiple sites with positive rheumatoid factor ([place]) -Patient has known history of rheumatoid arthritis for last few years.  She has had intermittent methotrexate due to financial difficulty and lack of insurance.  She has been on methotrexate on regular basis since October 2019 by her PCP.  She is taking Methotrexate 6 tablets po once weekly and folic acid 1 mg po daily.  She is apprehensive to continue on MTX during the coronavirus pandemic.  We discussed the risks and benefits of staying on MTX.  She will continue on the current dose.  She continues to have pain in both hands and right wrist joint.  She has right wrist joint and right 2nd MCP joint swelling.  She is currently not on prednisone.  She was advised to notify us if she develops increased joint pain or joint swelling.  She will follow up in 3 months.    High risk medication use - Methotrexate 6 tablets p.o. weekly, folic acid 1 mg p.o. daily.   Lab work from  05/11/18 were reviewed with the patient. Standing orders for CBC and CMP were placed today.  She was advised to avoid NSAIDs.  She can take tylenol very sparingly for pain relief.  She was advised to hold her dose of MTX if she develops an infection.   Chronic pain of both shoulders-She has  pain in bilateral shoulders and has been followed by Dr. [name].  She has limited range of motion of bilateral shoulders with abduction up to 90 degrees.   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  Status post total hip replacement, left-Performed by Dr. Mayer Camel in December 2019.    Smoker-She quit smoking and has been using nicotine gum.   Follow Up Instructions: She will follow up in 3 months.  Standing orders for CBC and CMP were placed today.  We will obtain lab work at her follow up visit.   She does not need any refills at this time.    I discussed the assessment and treatment plan with the patient. The patient was provided an opportunity to ask questions and all were answered. The patient agreed with the plan and demonstrated an understanding of the instructions.   The patient was advised to call back or seek an in-person evaluation if the symptoms worsen or if the condition fails to improve as anticipated.  I provided 25 minutes of non-face-to-face time during this encounter. Bo Merino, MD   Scribed by- Ofilia Neas, PA-C

## 2018-06-10 ENCOUNTER — Encounter: Payer: Self-pay | Admitting: Rheumatology

## 2018-06-10 ENCOUNTER — Telehealth (INDEPENDENT_AMBULATORY_CARE_PROVIDER_SITE_OTHER): Payer: Medicaid Other | Admitting: Rheumatology

## 2018-06-10 DIAGNOSIS — M05771 Rheumatoid arthritis with rheumatoid factor of right ankle and foot without organ or systems involvement: Secondary | ICD-10-CM

## 2018-06-10 DIAGNOSIS — M19211 Secondary osteoarthritis, right shoulder: Secondary | ICD-10-CM

## 2018-06-10 DIAGNOSIS — Z79899 Other long term (current) drug therapy: Secondary | ICD-10-CM

## 2018-06-10 DIAGNOSIS — M05741 Rheumatoid arthritis with rheumatoid factor of right hand without organ or systems involvement: Secondary | ICD-10-CM

## 2018-06-10 DIAGNOSIS — Z96642 Presence of left artificial hip joint: Secondary | ICD-10-CM

## 2018-06-10 DIAGNOSIS — M19212 Secondary osteoarthritis, left shoulder: Secondary | ICD-10-CM

## 2018-06-10 DIAGNOSIS — M05742 Rheumatoid arthritis with rheumatoid factor of left hand without organ or systems involvement: Secondary | ICD-10-CM

## 2018-06-10 DIAGNOSIS — F419 Anxiety disorder, unspecified: Secondary | ICD-10-CM

## 2018-06-10 DIAGNOSIS — Z803 Family history of malignant neoplasm of breast: Secondary | ICD-10-CM

## 2018-06-10 DIAGNOSIS — M05772 Rheumatoid arthritis with rheumatoid factor of left ankle and foot without organ or systems involvement: Secondary | ICD-10-CM

## 2018-06-10 DIAGNOSIS — F329 Major depressive disorder, single episode, unspecified: Secondary | ICD-10-CM

## 2018-06-10 DIAGNOSIS — Z5900 Homelessness unspecified: Secondary | ICD-10-CM

## 2018-06-10 DIAGNOSIS — F17200 Nicotine dependence, unspecified, uncomplicated: Secondary | ICD-10-CM

## 2018-06-10 DIAGNOSIS — M0579 Rheumatoid arthritis with rheumatoid factor of multiple sites without organ or systems involvement: Secondary | ICD-10-CM | POA: Diagnosis not present

## 2018-06-10 DIAGNOSIS — M24522 Contracture, left elbow: Secondary | ICD-10-CM

## 2018-06-22 ENCOUNTER — Other Ambulatory Visit: Payer: Self-pay

## 2018-06-22 ENCOUNTER — Ambulatory Visit: Payer: Medicaid Other | Attending: Internal Medicine | Admitting: Internal Medicine

## 2018-06-22 ENCOUNTER — Encounter: Payer: Self-pay | Admitting: Internal Medicine

## 2018-06-22 DIAGNOSIS — F17200 Nicotine dependence, unspecified, uncomplicated: Secondary | ICD-10-CM

## 2018-06-22 DIAGNOSIS — Z5900 Homelessness unspecified: Secondary | ICD-10-CM

## 2018-06-22 DIAGNOSIS — Z23 Encounter for immunization: Secondary | ICD-10-CM

## 2018-06-22 DIAGNOSIS — M069 Rheumatoid arthritis, unspecified: Secondary | ICD-10-CM

## 2018-06-22 MED ORDER — NICOTINE 14 MG/24HR TD PT24: 14.0000 mg | MEDICATED_PATCH | Freq: Every day | TRANSDERMAL | 1 refills | Status: DC

## 2018-06-22 NOTE — Progress Notes (Signed)
Had f/u with Rheumatology Concerns to d/c methotrexate- will discuss with Rheumatologist D/c'd for a few weeks: had cough, congestion. But had a dry cough  Would like RF on Nicotine patch, staying with a friend, that smokes  Pain- shoulder and hands 7-9 /10  Worse at night

## 2018-06-22 NOTE — Progress Notes (Signed)
Virtual Visit via Telephone Note  I connected with [name] on 06/22/18 at 3:19 p.m by telephone and verified that I am speaking with the correct person using two identifiers. Pt is outside the home of a friend   I discussed the limitations, risks, security and privacy concerns of performing an evaluation and management service by telephone and the availability of in person appointments. I also discussed with the patient that there may be a patient responsible charge related to this service. The patient expressed understanding and agreed to proceed.   History of Present Illness: Patient with history of RA involving multiple joints, tobacco dependence, recent left THR, homeless, Pos dep screen   RA:  Patient states that she is dealing with some chronic aches and pains in her shoulders.  Reports that her orthopedic surgeon Dr. [name] told her that she will need surgery on both shoulders.  Plan is to get an MRI of the right shoulder once the COVID-19 pandemic has died down.  Has some chronic fatigue issues that she attributes to her RA and methotrexate.  1 of the side effects of methotrexate is fatigue.  She has seen the rheumatologist Dr. Estanislado Pandy.  Plan is to keep her on the methotrexate for now.  She uses the prednisone only when needed.  The rheumatologist has recommended that she gets up-to-date with vaccines including pneumonia vaccine and Shingrix.  Patient is not on a biologic agent.  Housing:  Still homeless but currently staying at a friend's place. Has a case worker who is working with her to get her approved for disability  Tob dep:  Was doing good until she started staying with someone who smokes.  Would like to restart nicotine patches.  Smoking 5 cig/day currently  Observations/Objective: No direct observation done as this was a telephone encounter.  Assessment and Plan: 1. Tobacco dependence Advised smoking cessation.  Patient is wanting to quit.  She has agreed to try the  nicotine patches again. - nicotine (NICODERM CQ - DOSED IN MG/24 HOURS) 14 mg/24hr patch; Place 1 patch (14 mg total) onto the skin daily.  Dispense: 28 patch; Refill: 1  2. Rheumatoid arthritis involving multiple sites, unspecified rheumatoid factor presence ([place]) Followed by rheumatology.  3. Homeless Patient is working with a Product/process development scientist.  She hopes to get more stable housing once she is approved for disability  4. Need for vaccination against Streptococcus pneumoniae -Given that she is a smoker, she is eligible to receive the Pneumovax vaccine.  Should also get the Shingrix vaccine.  Patient currently not on a biologic agent for rheumatoid arthritis.   Follow Up Instructions: 2-3 mths   I discussed the assessment and treatment plan with the patient. The patient was provided an opportunity to ask questions and all were answered. The patient agreed with the plan and demonstrated an understanding of the instructions.   The patient was advised to call back or seek an in-person evaluation if the symptoms worsen or if the condition fails to improve as anticipated.  I provided 18 minutes of non-face-to-face time during this encounter.   Karle Plumber, MD

## 2018-07-01 ENCOUNTER — Telehealth: Payer: Self-pay | Admitting: *Deleted

## 2018-07-01 ENCOUNTER — Telehealth: Payer: Self-pay | Admitting: Internal Medicine

## 2018-07-01 NOTE — Telephone Encounter (Signed)
Returned pt call pt didn't answer lvm asking pt to give me a call at her earliest convenience

## 2018-07-01 NOTE — Telephone Encounter (Signed)
Patient contacted the office asking if her insurance would cover arthritis gloves for her to wear to help with her hands. Patient advised I am not sure. Patient advised she may contact [employer] to see. Patient provided the number and will call. Patient will call the office if they will cover and will ask Dr. Estanislado Pandy for prescription if appropriate.

## 2018-07-01 NOTE — Telephone Encounter (Signed)
Patient called requesting to speak to the nurse about some incontinence supplies, please follow up.

## 2018-07-02 NOTE — Telephone Encounter (Signed)
Patient called back, is requesting to speak to nurse, please follow up.

## 2018-07-02 NOTE — Telephone Encounter (Signed)
Unable to reach patient. NO answer.

## 2018-07-14 ENCOUNTER — Other Ambulatory Visit: Payer: Self-pay

## 2018-07-14 ENCOUNTER — Ambulatory Visit: Payer: Medicaid Other | Attending: Family Medicine | Admitting: Emergency Medicine

## 2018-07-14 DIAGNOSIS — Z23 Encounter for immunization: Secondary | ICD-10-CM | POA: Diagnosis not present

## 2018-07-14 NOTE — Progress Notes (Signed)
Patient arrived ambulatory, alert and orientated to clinic.  Patient is in clinic for a zoster vaccination  Injection given.  Patient observed for 20 minutes and then allowed to leave.  Patient given VIS and instruction on what to do if she had a reaction.  Patient acknowledged understanding of advice.

## 2018-07-23 ENCOUNTER — Telehealth: Payer: Self-pay | Admitting: Internal Medicine

## 2018-07-23 NOTE — Telephone Encounter (Signed)
Patients call taken.  Patient identified by name and date of birth.  Patient states that she is having shoulder pain and her orthopedist stated she should not take ibuprofen during the pandemic.  Patient also wanted to know if she should contact the orthopedist for pain medication  The reason she called here was to get incontinence supplies and as long as she was calling here for that she consider asking for pain medication.  Patient was advised that the Dr. And the Case Manager would be advise and a return call to the patient would be made when new information was available  Patient acknowledged understanding of advice.

## 2018-07-23 NOTE — Telephone Encounter (Signed)
1) Medication(s) Requested (by name): incognit pads lightweight 2) Pharmacy of Choice: [facility] [address] in [place] . 3) Special Requests:   Approved medications will be sent to the pharmacy, we will reach out if there is an issue.  Requests made after 3pm may not be addressed until the following business day!  If a patient is unsure of the name of the medication(s) please note and ask patient to call back when they are able to provide all info, do not send to responsible party until all information is available!

## 2018-07-23 NOTE — Telephone Encounter (Signed)
Patient called stating she would like to be prescribed pain medication for her shoulder and hands. States she has not been able to take her ibuprofen. Please follow up.

## 2018-07-23 NOTE — Telephone Encounter (Signed)
Nurse called the patient's home phone number but received no answer and message was left on the voicemail for the patient to call back.  Return phone number given. 

## 2018-07-26 NOTE — Telephone Encounter (Signed)
Patients call returned.  Patient identified by name and date of birth.  Patient called and an appointment was telephone encounter was made.  Patient acknowledged understanding of advice.

## 2018-07-26 NOTE — Telephone Encounter (Signed)
Pt is scheduled for an televisit t5/19/20

## 2018-07-27 ENCOUNTER — Telehealth: Payer: Self-pay

## 2018-07-27 ENCOUNTER — Other Ambulatory Visit: Payer: Self-pay

## 2018-07-27 ENCOUNTER — Ambulatory Visit: Payer: Medicaid Other | Attending: Internal Medicine | Admitting: Internal Medicine

## 2018-07-27 ENCOUNTER — Other Ambulatory Visit: Payer: Self-pay | Admitting: Orthopedic Surgery

## 2018-07-27 DIAGNOSIS — M25511 Pain in right shoulder: Secondary | ICD-10-CM

## 2018-07-27 DIAGNOSIS — N393 Stress incontinence (female) (male): Secondary | ICD-10-CM

## 2018-07-27 DIAGNOSIS — M19011 Primary osteoarthritis, right shoulder: Secondary | ICD-10-CM | POA: Diagnosis not present

## 2018-07-27 MED ORDER — TRAMADOL HCL 50 MG PO TABS: 50.0000 mg | ORAL_TABLET | Freq: Three times a day (TID) | ORAL | 0 refills | Status: DC | PRN

## 2018-07-27 NOTE — Telephone Encounter (Signed)
Contacted [facility] in [place]  to call in Tramadol Rx. Spoke to [place]  Name: Tramadol Strength: 50MG  Sig: Take 1 tablet by mouth every 8 hours prn Quantity: 60 Refills: 0

## 2018-07-27 NOTE — Progress Notes (Signed)
Virtual Visit via Telephone Note Due to current restrictions/limitations of in-office visits due to the COVID-19 pandemic, this scheduled clinical appointment was converted to a telehealth visit  I connected with [name] on 07/27/18 at 3:36 p.m by telephone and verified that I am speaking with the correct person using two identifiers. I am in my office.  The patient is stationary in her car.  Only the patient and myself participated in this encounter.  I discussed the limitations, risks, security and privacy concerns of performing an evaluation and management service by telephone and the availability of in person appointments. I also discussed with the patient that there may be a patient responsible charge related to this service. The patient expressed understanding and agreed to proceed.   History of Present Illness: Patient with history of RA involving multiple joints, tobacco dependence, recent left THR, homeless, Pos dep screen  Pt requesting incontinent supplies.  Leakage when she coughs/laughs x a few yrs. she has been doing Kegel exercises which she has not found helpful. "I'm all Kegel out."   Would like to get bladder control pads   Requesting something for pain in her right shoulder.  Pain keeps her up at nights.  She is seeing Dr. [name] with [facility].  He has ordered a CAT scan of the right shoulder which is scheduled for next Tuesday.  After that decision will be made about whether surgery is needed.  Patient states that she was taking ibuprofen as needed but was told by her rheumatologist Dr. Nancee Liter to avoid taking during [place] pandemic.  She has been on a controlled substance prescribing agreement with me that was initiated last year prior to her hip surgery.  However she has not had to use tramadol after her surgery. _ Patient Active Problem List   Diagnosis Date Noted  . Degenerative joint disease of shoulder region 06/08/2018  . Rheumatoid arthritis  involving both hands with positive rheumatoid factor ([place]) 06/08/2018  . Rheumatoid arthritis involving both feet with positive rheumatoid factor ([place]) 06/08/2018  . 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 Status post total hip replacement, 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  . Smoker 06/08/2018  . Anxiety and depression 06/08/2018  . Family history of breast cancer 03/25/2018  . 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 Rheumatoid arthritis involving multiple sites ([place]) 01/15/2018  . Tobacco dependence 01/15/2018  . Opioid use agreement exists 01/15/2018  . Positive depression screening 01/15/2018  . Homeless 01/15/2018     Current Outpatient Medications on File Prior to Visit  Medication Sig Dispense Refill  . albuterol (PROVENTIL HFA;VENTOLIN HFA) 108 (90 Base) MCG/ACT inhaler Inhale 2 puffs into the lungs every 6 (six) hours as needed for wheezing or shortness of breath. 1 Inhaler 2  . Cholecalciferol (VITAMIN D3) 75 MCG (3000 UT) TABS Take 3,000 Units by mouth daily.    . folic acid (FOLVITE) 1 MG tablet Take 1 tablet (1 mg total) by mouth daily. 90 tablet 0  . methotrexate (RHEUMATREX) 2.5 MG tablet Take 6 tabs PO once a wk 24 tablet 1  . methotrexate (RHEUMATREX) 2.5 MG tablet Take 6 tablets (15 mg total) by mouth once a week. Caution:Chemotherapy. Protect from light. 72 tablet 0  . Multiple Vitamin (MULTIVITAMIN WITH MINERALS) TABS tablet Take 1 tablet by mouth daily.    . nicotine (NICODERM CQ - DOSED IN MG/24 HOURS) 14 mg/24hr patch Place 1 patch (14 mg total) onto the skin daily. 28 patch 1  . predniSONE (DELTASONE) 1 MG tablet Take  1 tablet (1 mg total) by mouth daily with breakfast. 30 tablet 0  . predniSONE (DELTASONE) 5 MG tablet Take 4 tabs po x 4 days, 3  tabs po x 4 days, 2  tabs po x 4 days, 1  tab po x 4 days (Patient not taking: Reported on 06/22/2018) 40 tablet 0  . tiZANidine (ZANAFLEX) 2 MG tablet Take 1 tablet (2 mg total) by mouth every 6 (six) hours as needed. 60 tablet 0   No current  facility-administered medications on file prior to visit.    Observations: No direct observation done as this was a telephone encounter.  Assessment/plan: 1. Arthritis of shoulder region, right We will give a one-time refill on the tramadol until she follows up with Dr. [name] after the CAT scan of her shoulder to determine next steps. [place] controlled substance reporting system reviewed. - traMADol (ULTRAM) 50 MG tablet; Take 1 tablet (50 mg total) by mouth every 8 (eight) hours as needed. 1 tablet 2 times daily prn  Dispense: 60 tablet; Refill: 0  2. Female stress incontinence Prescription will be sent for her to get incontinence supplies mainly bladder control pads per her request. - Incontinence supply   Patient was given the opportunity to ask questions.  Patient verbalized understanding of the plan and was able to repeat key elements of the plan.   Orders Placed This Encounter  Procedures  . Incontinence supply     Requested Prescriptions   Signed Prescriptions Disp Refills  . traMADol (ULTRAM) 50 MG tablet 60 tablet 0    Sig: Take 1 tablet (50 mg total) by mouth every 8 (eight) hours as needed. 1 tablet 2 times daily prn    Future Appointments  Date Time Provider Sarah Ann  08/03/2018  3:00 PM [facility] 1 [facility]  09/15/2018  1:45 PM Bo Merino, MD [facility] None  09/23/2018  4:10 PM Ladell Pier, MD [facility] None     Follow Up Instructions: As previously scheduled   I discussed the assessment and treatment plan with the patient. The patient was provided an opportunity to ask questions and all were answered. The patient agreed with the plan and demonstrated an understanding of the instructions.   The patient was advised to call back or seek an in-person evaluation if the symptoms worsen or if the condition fails to improve as anticipated.  I provided 12 minutes of non-face-to-face time during this encounter.   Karle Plumber, MD

## 2018-08-03 ENCOUNTER — Ambulatory Visit
Admission: RE | Admit: 2018-08-03 | Discharge: 2018-08-03 | Disposition: A | Discharge status: Home / Self Care | Payer: Medicaid Other | Source: Ambulatory Visit | Attending: Orthopedic Surgery | Admitting: Orthopedic Surgery

## 2018-08-03 DIAGNOSIS — M25511 Pain in right shoulder: Secondary | ICD-10-CM

## 2018-09-01 NOTE — Progress Notes (Signed)
Office Visit Note  Patient: [name]             Date of Birth: [birth year]           MRN: [id number]             PCP: Ladell Pier, MD Referring: Ladell Pier, MD Visit Date: 09/15/2018 Occupation: @GUAROCC @  Subjective:  Pain and swelling in multiple joints.    History of Present Illness: [name] is a 58 y.o. female with history of rheumatoid arthritis.  She is on methotrexate 6 tablets p.o. weekly.  Patient has been on methotrexate intermittently for many years.  She states she continues to have pain and discomfort in her both hands, both feet and her right shoulder.  She has seen Dr. [name] for her right shoulder joint.  The CT scan showed severe arthritis.  He did not recommend surgery at this time.  She has had a prior prescription of prednisone which she takes on PRN basis.  She has been reports decreased grip strength in her hands.  She takes tramadol on PRN basis.  Activities of Daily Living:  Patient reports morning stiffness for 1-2 hours.   Patient Reports nocturnal pain.  Difficulty dressing/grooming: Reports Difficulty climbing stairs: Denies Difficulty getting out of chair: Denies Difficulty using hands for taps, buttons, cutlery, and/or writing: Reports  Review of Systems  Constitutional: Positive for fatigue. Negative for night sweats, weight gain and weight loss.  HENT: Negative for mouth sores, trouble swallowing, trouble swallowing, mouth dryness and nose dryness.   Eyes: Negative for pain, redness, itching, visual disturbance and dryness.  Respiratory: Negative for cough, shortness of breath and difficulty breathing.   Cardiovascular: Negative for chest pain, palpitations, hypertension, irregular heartbeat and swelling in legs/feet.  Gastrointestinal: Positive for diarrhea. Negative for abdominal pain, blood in stool and constipation.  Endocrine: Negative for increased urination.  Genitourinary: Negative for painful urination, pelvic pain  and vaginal dryness.  Musculoskeletal: Positive for arthralgias, joint pain, joint swelling and morning stiffness. Negative for myalgias, muscle weakness, muscle tenderness and myalgias.  Skin: Negative for color change, rash, hair loss, redness, skin tightness, ulcers and sensitivity to sunlight.  Allergic/Immunologic: Negative for susceptible to infections.  Neurological: Positive for weakness. Negative for dizziness, headaches, memory loss and night sweats.  Hematological: Negative for swollen glands.  Psychiatric/Behavioral: Negative for depressed mood, confusion and sleep disturbance. The patient is not nervous/anxious.     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   Rheumatoid arthritis involving multiple sites ([place]) 01/15/2018   Tobacco dependence 01/15/2018   Opioid use agreement exists 01/15/2018   Positive depression screening 01/15/2018   Homeless 01/15/2018    Past Medical History:  Diagnosis Date   Anxiety    Bronchitis age 8   Depression    Hx of bladder infections    been over 5 years since last one    Rheumatoid arthritis ([place])    unc rheum    Tobacco abuse    Trouble in sleeping  Family History  Problem Relation Age of Onset   Breast cancer Mother    Cervical cancer Mother    Cancer Brother    Healthy Son    Past Surgical History:  Procedure Laterality Date   TOTAL HIP ARTHROPLASTY Left 02/15/2018   Procedure: TOTAL HIP ARTHROPLASTY  ANTERIOR APPROACH;  Surgeon: [name], MD;  Location: [facility];  Service: Orthopedics;  Laterality: Left;   WISDOM TOOTH EXTRACTION     Social History   Social History Narrative   Not on file   Immunization History  Administered Date(s) Administered   Pneumococcal Conjugate-13 07/14/2018   Tdap 03/25/2018   Zoster Recombinat (Shingrix) 07/14/2018     Objective: Vital Signs: BP 110/75 (BP Location: Left Arm, Patient Position: Sitting, Cuff Size: Normal)    Pulse 84    Resp 13    Ht 5' 5.5" (1.664 m)    Wt 104 lb (47.2 kg)    BMI 17.04 kg/m    Physical Exam Vitals signs and nursing note reviewed.  Constitutional:      Appearance: She is well-developed.  HENT:     Head: Normocephalic and atraumatic.  Eyes:     Conjunctiva/sclera: Conjunctivae normal.  Neck:     Musculoskeletal: Normal range of motion.  Cardiovascular:     Rate and Rhythm: Normal rate and regular rhythm.     Heart sounds: Normal heart sounds.  Pulmonary:     Effort: Pulmonary effort is normal.     Breath sounds: Normal breath sounds.  Abdominal:     General: Bowel sounds are normal.     Palpations: Abdomen is soft.  Lymphadenopathy:     Cervical: No cervical adenopathy.  Skin:    General: Skin is warm and dry.     Capillary Refill: Capillary refill takes less than 2 seconds.  Neurological:     Mental Status: She is alert and oriented to person, place, and time.  Psychiatric:        Behavior: Behavior normal.      Musculoskeletal Exam: C-spine thoracic and lumbar spine with good range of motion.  She has limited painful range of motion of bilateral shoulders.  She has left elbow joint contraction.  She has nodules on palpation of her right elbow.  She has synovial thickening over bilateral wrist joints.  Some synovitis was noted over MCP joints.  Her left hip joint is replaced.  Knee joints and ankle joints with good range of motion.  Synovial thickening was noted over MTP joints with no active  synovitis.  CDAI Exam: CDAI Score: 11.6  Patient Global: 8 mm; Provider Global: 8 mm Swollen: 2 ; Tender: 8  Joint Exam      Right  Left  Glenohumeral   Tender   Tender  Elbow      Tender  Wrist   Tender   Tender  MCP 2  Swollen Tender  Swollen Tender  MCP 3   Tender        Investigation: No additional findings.  Imaging: No results found.  Recent Labs: Lab Results  Component Value Date   WBC 7.6 05/11/2018   HGB 14.6 05/11/2018   PLT 288 05/11/2018   NA 139 05/11/2018   K 4.4 05/11/2018   CL 101 05/11/2018   CO2 28 05/11/2018   GLUCOSE 94 05/11/2018   BUN 13 05/11/2018   CREATININE 0.96 05/11/2018   BILITOT 0.4 05/11/2018   ALKPHOS 87 01/15/2018   AST 18 05/11/2018   ALT 14  05/11/2018   PROT 7.1 05/11/2018   PROT 7.0 05/11/2018   ALBUMIN 4.3 01/15/2018   CALCIUM 9.4 05/11/2018   GFRAA 75 05/11/2018   QFTBGOLDPLUS NEGATIVE 05/11/2018    Speciality Comments: No specialty comments available.  Procedures:  No procedures performed Allergies: Patient has no known allergies.   Assessment / Plan:     Visit Diagnoses: Rheumatoid arthritis involving multiple sites with positive rheumatoid factor ([place]) -patient has been on methotrexate 6 tablets p.o. weekly intermittently for many years.  She also takes prednisone on PRN basis.  This is in person second visit with me today.  She has severe erosive disease involving multiple joints.  I would like to switch her to subcu methotrexate after the lab work.  The dose will be 0.8 mL subcu weekly.  We will check labs in 2 weeks and then every 3 months to monitor for drug toxicity.  She will also need increased dose of folic acid at 2 mg p.o. daily.  If she has an adequate response I would consider Biologics as soon as possible.  High risk medication use -Methotrexate 6 tablets p.o. weekly, folic acid 1 mg p.o. daily.  - Plan: CBC with Differential/Platelet, COMPLETE METABOLIC PANEL WITH GFR, today.  Rheumatoid arthritis of  shoulder region [facility]) - Right shoulder joint with severe rheumatoid arthritis and protrusion.  I reviewed CT scan of her right shoulder which showed severe arthritis with protrusion of her humerus.  Patient states she has seen Dr. [name] in the past who felt that the surgery will be too complicated for her.  She has limited range of motion of bilateral shoulders.  Rheumatoid arthritis involving both hands with positive rheumatoid factor ([facility]) - Severe erosive disease on the x-rays.  She has some synovial thickening and mild synovitis on examination.  We will like to see how she does on injectable methotrexate.  If she has an adequate response she will need more aggressive therapy.  Contracture of left elbow - she has 10 degree contracture in her left elbow.  She had no synovitis in her left elbow.  Status post total hip replacement, left - Performed by Dr. Mayer Camel in December 2019.   Doing well.  Rheumatoid arthritis involving both feet with positive rheumatoid factor ([facility]) - Severe erosive disease   Smoker - She quit smoking and has been using nicotine gum.   Anxiety and depression  Homeless  Family history of breast cancer   Orders: Orders Placed This Encounter  Procedures   CBC with Differential/Platelet   COMPLETE METABOLIC PANEL WITH GFR   No orders of the defined types were placed in this encounter.   Face-to-face time spent with patient was 30 minutes. Greater than 50% of time was spent in counseling and coordination of care.  Follow-Up Instructions: Return in about 3 months (around 12/16/2018) for Rheumatoid arthritis.   Bo Merino, MD  Note - This record has been created using Editor, commissioning.  Chart creation errors have been sought, but may not always  have been located. Such creation errors do not reflect on  the standard of medical care.

## 2018-09-15 ENCOUNTER — Telehealth: Payer: Self-pay | Admitting: Pharmacist

## 2018-09-15 ENCOUNTER — Other Ambulatory Visit: Payer: Self-pay

## 2018-09-15 ENCOUNTER — Ambulatory Visit (INDEPENDENT_AMBULATORY_CARE_PROVIDER_SITE_OTHER): Payer: Medicaid Other | Admitting: Rheumatology

## 2018-09-15 ENCOUNTER — Encounter: Payer: Self-pay | Admitting: Rheumatology

## 2018-09-15 VITALS — BP 110/75 | HR 84 | Resp 13 | Ht 65.5 in | Wt 104.0 lb

## 2018-09-15 DIAGNOSIS — F17200 Nicotine dependence, unspecified, uncomplicated: Secondary | ICD-10-CM

## 2018-09-15 DIAGNOSIS — M05741 Rheumatoid arthritis with rheumatoid factor of right hand without organ or systems involvement: Secondary | ICD-10-CM

## 2018-09-15 DIAGNOSIS — M0579 Rheumatoid arthritis with rheumatoid factor of multiple sites without organ or systems involvement: Secondary | ICD-10-CM | POA: Diagnosis not present

## 2018-09-15 DIAGNOSIS — M069 Rheumatoid arthritis, unspecified: Secondary | ICD-10-CM

## 2018-09-15 DIAGNOSIS — Z79899 Other long term (current) drug therapy: Secondary | ICD-10-CM

## 2018-09-15 DIAGNOSIS — M05771 Rheumatoid arthritis with rheumatoid factor of right ankle and foot without organ or systems involvement: Secondary | ICD-10-CM

## 2018-09-15 DIAGNOSIS — M24522 Contracture, left elbow: Secondary | ICD-10-CM

## 2018-09-15 DIAGNOSIS — Z96642 Presence of left artificial hip joint: Secondary | ICD-10-CM

## 2018-09-15 DIAGNOSIS — M05742 Rheumatoid arthritis with rheumatoid factor of left hand without organ or systems involvement: Secondary | ICD-10-CM

## 2018-09-15 DIAGNOSIS — M05772 Rheumatoid arthritis with rheumatoid factor of left ankle and foot without organ or systems involvement: Secondary | ICD-10-CM

## 2018-09-15 NOTE — Patient Instructions (Signed)
Standing Labs We placed an order today for your standing lab work.    Please come back and get your standing labs in 2 weeks after starting on methotrexate injection then every 3 months  We have open lab daily Monday through Thursday from 8:30-12:30 PM and 1:30-4:30 PM and Friday from 8:30-12:30 PM and 1:30 -4:00 PM at the office of Dr. Bo Merino.   You may experience shorter wait times on Monday and Friday afternoons. The office is located at [name] [place], [place],  [address] No appointment is necessary.   Labs are drawn by [facility].  You may receive a bill from [facility] for your lab work.  If you wish to have your labs drawn at another location, please call the office 24 hours in advance to send orders.  If you have any questions regarding directions or hours of operation,  please call [phone].   Just as a reminder please drink plenty of water prior to coming for your lab work. Thanks!

## 2018-09-15 NOTE — Telephone Encounter (Signed)
Her dose of methotrexate will be 0.8 ml every 7 days and folic acid needs to be increased to 2 mg daily pending lab results.

## 2018-09-15 NOTE — Progress Notes (Signed)
Pharmacy Note  Subjective:  Patient presents today to the [facility] to see Dr. Estanislado Pandy.  .Patient seen by pharmacist for methotrexate vial and syringe training.  Objective: Current Outpatient Medications on File Prior to Visit  Medication Sig Dispense Refill  . albuterol (PROVENTIL HFA;VENTOLIN HFA) 108 (90 Base) MCG/ACT inhaler Inhale 2 puffs into the lungs every 6 (six) hours as needed for wheezing or shortness of breath. 1 Inhaler 2  . folic acid (FOLVITE) 1 MG tablet Take 1 tablet (1 mg total) by mouth daily. 90 tablet 0  . methotrexate (RHEUMATREX) 2.5 MG tablet Take 6 tablets (15 mg total) by mouth once a week. Caution:Chemotherapy. Protect from light. 72 tablet 0  . nicotine (NICODERM CQ - DOSED IN MG/24 HOURS) 14 mg/24hr patch Place 1 patch (14 mg total) onto the skin daily. 28 patch 1  . predniSONE (DELTASONE) 1 MG tablet Take 1 tablet (1 mg total) by mouth daily with breakfast. 30 tablet 0  . tiZANidine (ZANAFLEX) 2 MG tablet Take 1 tablet (2 mg total) by mouth every 6 (six) hours as needed. 60 tablet 0  . traMADol (ULTRAM) 50 MG tablet Take 1 tablet (50 mg total) by mouth every 8 (eight) hours as needed. 1 tablet 2 times daily prn 60 tablet 0  . Cholecalciferol (VITAMIN D3) 75 MCG (3000 UT) TABS Take 3,000 Units by mouth daily.    . Multiple Vitamin (MULTIVITAMIN WITH MINERALS) TABS tablet Take 1 tablet by mouth daily.    . predniSONE (DELTASONE) 5 MG tablet Take 4 tabs po x 4 days, 3  tabs po x 4 days, 2  tabs po x 4 days, 1  tab po x 4 days (Patient not taking: Reported on 06/22/2018) 40 tablet 0   No current facility-administered medications on file prior to visit.      Assessment/Plan:  Educated patient on how to use a vial and syringe and reviewed injection technique with patient.  Patient was able to demonstrate proper technique for injections using vial and syringe.  Provided patient educational material regarding injection technique and storage of  methotrexate.    Counseled patient of proper use, storage,  and dosing of Methotrexate.  Counseled patient on avoiding live vaccines, discontinuing methotrexate for surgical procedures, and any time patient is placed on Antibiotics. Also counseled on the importance of lab monitor every 3 months.  Patient consented to Methotrexate. Will upload consent in the media tab. Standing orders placed for lab monitor all questions were encouraged and answered.   Her dose of methotrexate will be 0.8 ml every 7 days and folic acid needs to be increased to 2 mg daily pending lab results.    Will consider Enbrel or Humira if inadequate response to injectable methotrexate.  All questions encouraged and answered.  Instructed patient to call with any further questions or concerns.  Mariella Saa, PharmD, [facility] Rheumatology Clinical Pharmacist  09/15/2018 2:36 PM

## 2018-09-16 LAB — COMPLETE METABOLIC PANEL WITH GFR
AG Ratio: 1.6 (calc) (ref 1.0–2.5)
ALT: 8 U/L (ref 6–29)
AST: 11 U/L (ref 10–35)
Albumin: 4.2 g/dL (ref 3.6–5.1)
Alkaline phosphatase (APISO): 86 U/L (ref 37–153)
BUN: 16 mg/dL (ref 7–25)
CO2: 30 mmol/L (ref 20–32)
Calcium: 10.3 mg/dL (ref 8.6–10.4)
Chloride: 106 mmol/L (ref 98–110)
Creat: 0.99 mg/dL (ref 0.50–1.05)
GFR, Est African American: 72 mL/min/{1.73_m2} (ref >=60)
GFR, Est Non African American: 62 mL/min/{1.73_m2} (ref >=60)
Globulin: 2.7 g/dL (calc) (ref 1.9–3.7)
Glucose, Bld: 94 mg/dL (ref 65–99)
Potassium: 6 mmol/L — ABNORMAL HIGH (ref 3.5–5.3)
Sodium: 144 mmol/L (ref 135–146)
Total Bilirubin: 0.5 mg/dL (ref 0.2–1.2)
Total Protein: 6.9 g/dL (ref 6.1–8.1)

## 2018-09-16 LAB — CBC WITH DIFFERENTIAL/PLATELET
Absolute Monocytes: 415 cells/uL (ref 200–950)
Basophils Absolute: 27 cells/uL (ref 0–200)
Basophils Relative: 0.4 %
Eosinophils Absolute: 197 cells/uL (ref 15–500)
Eosinophils Relative: 2.9 %
HCT: 44.6 % (ref 35.0–45.0)
Hemoglobin: 15.1 g/dL (ref 11.7–15.5)
Lymphs Abs: 1911 cells/uL (ref 850–3900)
MCH: 29.2 pg (ref 27.0–33.0)
MCHC: 33.9 g/dL (ref 32.0–36.0)
MCV: 86.1 fL (ref 80.0–100.0)
MPV: 10.3 fL (ref 7.5–12.5)
Monocytes Relative: 6.1 %
Neutro Abs: 4250 cells/uL (ref 1500–7800)
Neutrophils Relative %: 62.5 %
Platelets: 263 10*3/uL (ref 140–400)
RBC: 5.18 10*6/uL — ABNORMAL HIGH (ref 3.80–5.10)
RDW: 14.6 % (ref 11.0–15.0)
Total Lymphocyte: 28.1 %
WBC: 6.8 10*3/uL (ref 3.8–10.8)

## 2018-09-16 NOTE — Progress Notes (Signed)
K is high. Most likely hemolyzed.  Please check if sample was hemolyzed with the lab.  If not then she will have to come in for repeat potassium.  Also asked patient if she is taking any over-the-counter potassium.

## 2018-09-19 ENCOUNTER — Other Ambulatory Visit: Payer: Self-pay | Admitting: Rheumatology

## 2018-09-20 MED ORDER — "TUBERCULIN SYRINGE 27G X 1/2"" 1 ML MISC": 12.0000 | 3 refills | Status: DC

## 2018-09-20 MED ORDER — METHOTREXATE SODIUM CHEMO INJECTION 50 MG/2ML: 20.0000 mg | INTRAMUSCULAR | 0 refills | Status: DC

## 2018-09-20 MED ORDER — FOLIC ACID 1 MG PO TABS: 2.0000 mg | ORAL_TABLET | Freq: Every day | ORAL | 3 refills | Status: DC

## 2018-09-20 NOTE — Telephone Encounter (Signed)
Prescription sent to the pharmacy.

## 2018-09-23 ENCOUNTER — Ambulatory Visit: Payer: Medicaid Other | Attending: Internal Medicine | Admitting: Internal Medicine

## 2018-09-23 ENCOUNTER — Other Ambulatory Visit: Payer: Self-pay

## 2018-10-12 ENCOUNTER — Telehealth: Payer: Self-pay | Admitting: Rheumatology

## 2018-10-12 NOTE — Telephone Encounter (Signed)
Patient request a call back to discuss injections. Patient is having difficulty injecting herself due to rt hand swelling. Patient wants to come in tomorrow after 3pm to discuss how she can inject with her hand swelling since she will be in town for another appt at that time. Please call to discuss.

## 2018-10-13 NOTE — Telephone Encounter (Signed)
Findings of benefits investigation via test claims at [facility] for OTREXUP :  Insurance: [place] MEDICAID   Otrexup 20mg  - # 4 for a 28 day supply through patient's insurance is $ 3.00. No prior authorization required.  8:55 AM Beatriz Chancellor, CPhT

## 2018-10-14 NOTE — Telephone Encounter (Signed)
Patient returned call to the office. Attempted to return patient's call and left message for patient to call the office.

## 2018-10-14 NOTE — Telephone Encounter (Signed)
Attempted to contact the patient and left message for patient to call the office.  

## 2018-10-15 MED ORDER — OTREXUP 20 MG/0.4ML ~~LOC~~ SOAJ: 20.0000 mg | SUBCUTANEOUS | 0 refills | Status: DC

## 2018-10-15 NOTE — Telephone Encounter (Signed)
Medication Samples have been provided to the patient.  Drug name: Otrexup       Strength: 20 mg       Qty: 2 LOT: [id number]  Exp.Date: 08/2019  Dosing instructions: Inject one pen once weekly.  The patient has been instructed regarding the correct time,ose, and frequency of taking this medication, including desired effects and most common side effects.   Gwenlyn Perking 11:14 AM 10/15/2018

## 2018-10-15 NOTE — Addendum Note (Signed)
Addended by: Carole Binning on: 10/15/2018 11:16 AM   Modules accepted: Orders

## 2018-10-22 ENCOUNTER — Telehealth: Payer: Self-pay | Admitting: Internal Medicine

## 2018-10-22 MED ORDER — ALBUTEROL SULFATE HFA 108 (90 BASE) MCG/ACT IN AERS: 2.0000 | INHALATION_SPRAY | Freq: Four times a day (QID) | RESPIRATORY_TRACT | 2 refills | Status: DC | PRN

## 2018-10-22 NOTE — Telephone Encounter (Signed)
New Message   1) Medication(s) Requested (by name): albuterol (PROVENTIL HFA;VENTOLIN HFA) 108 (90 Base) MCG/ACT inhaler  2) Pharmacy of Choice: [id number] - [place], [place]  3) Special Requests:   Approved medications will be sent to the pharmacy, we will reach out if there is an issue.  Requests made after 3pm may not be addressed until the following business day!  If a patient is unsure of the name of the medication(s) please note and ask patient to call back when they are able to provide all info, do not send to responsible party until all information is available!

## 2018-10-22 NOTE — Telephone Encounter (Signed)
Refills sent

## 2018-10-29 MED FILL — OTREXUP 20 MG/0.4 ML AUTO-I: 20 | 84 days supply | Qty: 5 | Fill #0

## 2018-11-02 ENCOUNTER — Encounter: Payer: Self-pay | Admitting: Internal Medicine

## 2018-11-02 ENCOUNTER — Ambulatory Visit (INDEPENDENT_AMBULATORY_CARE_PROVIDER_SITE_OTHER): Payer: Medicaid Other | Admitting: Internal Medicine

## 2018-11-02 DIAGNOSIS — Z1211 Encounter for screening for malignant neoplasm of colon: Secondary | ICD-10-CM

## 2018-11-02 DIAGNOSIS — Z1239 Encounter for other screening for malignant neoplasm of breast: Secondary | ICD-10-CM

## 2018-11-02 DIAGNOSIS — R636 Underweight: Secondary | ICD-10-CM

## 2018-11-02 DIAGNOSIS — M19011 Primary osteoarthritis, right shoulder: Secondary | ICD-10-CM

## 2018-11-02 DIAGNOSIS — M0579 Rheumatoid arthritis with rheumatoid factor of multiple sites without organ or systems involvement: Secondary | ICD-10-CM

## 2018-11-02 DIAGNOSIS — Z23 Encounter for immunization: Secondary | ICD-10-CM

## 2018-11-02 NOTE — Progress Notes (Signed)
Following up on urinary incontinence. States that it has improved a lot since her last visit.

## 2018-11-02 NOTE — Progress Notes (Signed)
Virtual Visit via Telephone Note Due to current restrictions/limitations of in-office visits due to the COVID-19 pandemic, this scheduled clinical appointment was converted to a telehealth visit  I connected with [name] on 11/02/18 at 2:50 p.m  by telephone and verified that I am speaking with the correct person using two identifiers. I am in my office.  The patient is at home.  Only the patient and myself participated in this encounter.  I discussed the limitations, risks, security and privacy concerns of performing an evaluation and management service by telephone and the availability of in person appointments. I also discussed with the patient that there may be a patient responsible charge related to this service. The patient expressed understanding and agreed to proceed.   History of Present Illness: Patient with history of rheumatoid arthritis, tob dep, THR, anxiety/depression, stress incontinence  Pt was approved for disability.  She does not have her own place as yet.  Still staying with a friend.  Her orthopedics has reservation about doing surgery on RT shoulder because he thinks high risk of complications.  She has been going to the aquatic ctr to do exercises which helps  RA:  Still followed by rheumatologist.  She was recently changed from oral MTX to the inj forml.  She has not really noticed a major difference in her symptoms.  Concern that she has not been able to gain wgh.  She eats 5 smaller meals a day. Drinks Ensure twice a day when she is able to get them.  But has not been able to get her weight above 120.  She tells me that prior to seeing me last fall she used to weigh about 120 pounds.  TSH level was in the normal range.  HIV test was negative.  She is overdue for mammogram and colon cancer screening.  Denies any blood in the stools.  Outpatient Encounter Medications as of 11/02/2018  Medication Sig  . albuterol (VENTOLIN HFA) 108 (90 Base) MCG/ACT inhaler Inhale 2  puffs into the lungs every 6 (six) hours as needed for wheezing or shortness of breath.  . folic acid (FOLVITE) 1 MG tablet Take 2 tablets (2 mg total) by mouth daily.  . Methotrexate, PF, (OTREXUP) 20 MG/0.4ML SOAJ Inject 20 mg into the skin once a week.  . Multiple Vitamin (MULTIVITAMIN WITH MINERALS) TABS tablet Take 1 tablet by mouth daily.  . predniSONE (DELTASONE) 1 MG tablet Take 1 tablet (1 mg total) by mouth daily with breakfast.  . TUBERCULIN SYR 1CC/27GX1/2" (B-D TB SYRINGE 1CC/27GX1/2") 27G X 1/2" 1 ML MISC 12 Syringes by Does not apply route once a week.  . Cholecalciferol (VITAMIN D3) 75 MCG (3000 UT) TABS Take 3,000 Units by mouth daily.  . [DISCONTINUED] methotrexate 50 MG/2ML injection Inject 0.8 mLs (20 mg total) into the skin once a week.  . [DISCONTINUED] nicotine (NICODERM CQ - DOSED IN MG/24 HOURS) 14 mg/24hr patch Place 1 patch (14 mg total) onto the skin daily.  . [DISCONTINUED] predniSONE (DELTASONE) 5 MG tablet Take 4 tabs po x 4 days, 3  tabs po x 4 days, 2  tabs po x 4 days, 1  tab po x 4 days (Patient not taking: Reported on 06/22/2018)  . [DISCONTINUED] tiZANidine (ZANAFLEX) 2 MG tablet Take 1 tablet (2 mg total) by mouth every 6 (six) hours as needed.  . [DISCONTINUED] traMADol (ULTRAM) 50 MG tablet Take 1 tablet (50 mg total) by mouth every 8 (eight) hours as needed. 1 tablet  2 times daily prn   No facility-administered encounter medications on file as of 11/02/2018.       Observations/Objective: No diarrhea constipation numbness this was a telephone encounter  Assessment and Plan: 1. Primary osteoarthritis of right shoulder Followed by Orthopedics.  She takes tramadol as needed.  2. Rheumatoid arthritis involving multiple sites with positive rheumatoid factor [facility]) Plan rheumatology.  He is currently on methotrexate and prednisone  3. Low body weight due to inadequate caloric intake Like to get her up to date with age-appropriate cancer screenings.  She is  agreeable for referral for colonoscopy and mammogram. She will continue to supplement meals with Ensure or boost  4. Screening for colon cancer - Ambulatory referral to Gastroenterology  5. Breast cancer screening - MM Digital Screening; Future  6. Need for influenza vaccination Encouraged her to get her flu vaccine as soon as possible.  She can get it at any outside pharmacy.   Follow Up Instructions: 3-4 mths   I discussed the assessment and treatment plan with the patient. The patient was provided an opportunity to ask questions and all were answered. The patient agreed with the plan and demonstrated an understanding of the instructions.   The patient was advised to call back or seek an in-person evaluation if the symptoms worsen or if the condition fails to improve as anticipated.  I provided 15 minutes of non-face-to-face time during this encounter.   Karle Plumber, MD

## 2018-12-02 NOTE — Progress Notes (Deleted)
Office Visit Note  Patient: [name]             Date of Birth: 03/04/1961           MRN: [id number]             PCP: Ladell Pier, MD Referring: Ladell Pier, MD Visit Date: 12/16/2018 Occupation: @GUAROCC @  Subjective:  No chief complaint on file.  Otrexup 20 mg every 7 days, folic acid 1 mg 2 tablets daily, and prednisone 1 mg daily.  Most recent CBC/CMP within normal limits on 09/15/2018.  Due for CBC/CMP today and will monitor every 3 months.  History of Present Illness: [name] is a 58 y.o. female ***   Activities of Daily Living:  Patient reports morning stiffness for *** {minute/hour:19697}.   Patient {ACTIONS;DENIES/REPORTS:21021675::"Denies"} nocturnal pain.  Difficulty dressing/grooming: {ACTIONS;DENIES/REPORTS:21021675::"Denies"} Difficulty climbing stairs: {ACTIONS;DENIES/REPORTS:21021675::"Denies"} Difficulty getting out of chair: {ACTIONS;DENIES/REPORTS:21021675::"Denies"} Difficulty using hands for taps, buttons, cutlery, and/or writing: {ACTIONS;DENIES/REPORTS:21021675::"Denies"}  No Rheumatology ROS completed.   PMFS History:  Patient Active Problem List   Diagnosis Date Noted  . Degenerative joint disease of shoulder region 06/08/2018  . 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 Rheumatoid arthritis involving both feet with positive rheumatoid factor ([place]) 06/08/2018  . 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 Status post total hip replacement, 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  . Smoker 06/08/2018  . Anxiety and depression 06/08/2018  . Family history of breast cancer 03/25/2018  . 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 Rheumatoid arthritis involving multiple sites ([place]) 01/15/2018  . Tobacco dependence 01/15/2018  . Opioid use agreement exists 01/15/2018  . Positive depression screening 01/15/2018  . Homeless 01/15/2018    Past Medical History:  Diagnosis Date  . Anxiety   . Bronchitis age 10  .  Depression   . Hx of bladder infections    been over 5 years since last one   . Rheumatoid arthritis ([place])    unc rheum   . Tobacco abuse   . Trouble in sleeping     Family History  Problem Relation Age of Onset  . Breast cancer Mother   . Cervical cancer Mother   . Cancer Brother   . Healthy Son    Past Surgical History:  Procedure Laterality Date  . TOTAL HIP ARTHROPLASTY Left 02/15/2018   Procedure: TOTAL HIP ARTHROPLASTY ANTERIOR APPROACH;  Surgeon: [name], MD;  Location: [facility];  Service: Orthopedics;  Laterality: Left;  . WISDOM TOOTH EXTRACTION     Social History   Social History Narrative  . Not on file   Immunization History  Administered Date(s) Administered  . Pneumococcal Conjugate-13 07/14/2018  . Tdap 03/25/2018  . Zoster Recombinat (Shingrix) 07/14/2018     Objective: Vital Signs: There were no vitals taken for this visit.   Physical Exam   Musculoskeletal Exam: ***  CDAI Exam: CDAI Score: - Patient Global: -; Provider Global: - Swollen: -; Tender: - Joint Exam   No joint exam has been documented for this visit   There is currently no information documented on the homunculus. Go to the Rheumatology activity and complete the homunculus joint exam.  Investigation: No additional findings.  Imaging: No results found.  Recent Labs: Lab Results  Component Value Date   WBC 6.8 09/15/2018   HGB 15.1 09/15/2018   PLT 263 09/15/2018   NA 144 09/15/2018   K 6.0 (H) 09/15/2018   CL 106 09/15/2018   CO2 30  09/15/2018   GLUCOSE 94 09/15/2018   BUN 16 09/15/2018   CREATININE 0.99 09/15/2018   BILITOT 0.5 09/15/2018   ALKPHOS 87 01/15/2018   AST 11 09/15/2018   ALT 8 09/15/2018   PROT 6.9 09/15/2018   ALBUMIN 4.3 01/15/2018   CALCIUM 10.3 09/15/2018   GFRAA 72 09/15/2018   QFTBGOLDPLUS NEGATIVE 05/11/2018    Speciality Comments: No specialty comments available.  Procedures:  No procedures performed Allergies: Patient has no known  allergies.   Assessment / Plan:     Visit Diagnoses: No diagnosis found.  Orders: No orders of the defined types were placed in this encounter.  No orders of the defined types were placed in this encounter.   Face-to-face time spent with patient was *** minutes. Greater than 50% of time was spent in counseling and coordination of care.  Follow-Up Instructions: No follow-ups on file.   Earnestine Mealing, CMA  Note - This record has been created using Editor, commissioning.  Chart creation errors have been sought, but may not always  have been located. Such creation errors do not reflect on  the standard of medical care.

## 2018-12-09 ENCOUNTER — Encounter: Payer: Self-pay | Admitting: *Deleted

## 2018-12-14 ENCOUNTER — Telehealth: Payer: Self-pay | Admitting: Internal Medicine

## 2018-12-14 NOTE — Telephone Encounter (Signed)
Patient called stating she received a call to schedule a colonoscopy. Please follow up.

## 2018-12-16 ENCOUNTER — Ambulatory Visit: Payer: Medicaid Other | Admitting: Rheumatology

## 2018-12-16 NOTE — Progress Notes (Deleted)
Office Visit Note  Patient: [name]             Date of Birth: 06/05/1960           MRN: [id number]             PCP: Ladell Pier, MD Referring: Ladell Pier, MD Visit Date: 12/24/2018 Occupation: @GUAROCC @  Subjective:  No chief complaint on file.   History of Present Illness: [name] is a 58 y.o. female ***   Activities of Daily Living:  Patient reports morning stiffness for *** {minute/hour:19697}.   Patient {ACTIONS;DENIES/REPORTS:21021675::"Denies"} nocturnal pain.  Difficulty dressing/grooming: {ACTIONS;DENIES/REPORTS:21021675::"Denies"} Difficulty climbing stairs: {ACTIONS;DENIES/REPORTS:21021675::"Denies"} Difficulty getting out of chair: {ACTIONS;DENIES/REPORTS:21021675::"Denies"} Difficulty using hands for taps, buttons, cutlery, and/or writing: {ACTIONS;DENIES/REPORTS:21021675::"Denies"}  No Rheumatology ROS completed.   PMFS History:  Patient Active Problem List   Diagnosis Date Noted  . Degenerative joint disease of shoulder region 06/08/2018  . Rheumatoid arthritis involving both hands with positive rheumatoid factor ([place]) 06/08/2018  . Rheumatoid arthritis involving both feet with positive rheumatoid factor ([place]) 06/08/2018  . 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 Status post total hip replacement, 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  . Smoker 06/08/2018  . Anxiety and depression 06/08/2018  . Family history of breast cancer 03/25/2018  . 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 Rheumatoid arthritis involving multiple sites ([place]) 01/15/2018  . Tobacco dependence 01/15/2018  . Opioid use agreement exists 01/15/2018  . Positive depression screening 01/15/2018  . Homeless 01/15/2018    Past Medical History:  Diagnosis Date  . Anxiety   . Bronchitis age 1  . Depression   . Hx of bladder infections    been over 5 years since last one   . Rheumatoid arthritis ([place])    unc rheum   . Tobacco abuse   . Trouble in sleeping     Family History  Problem  Relation Age of Onset  . Breast cancer Mother   . Cervical cancer Mother   . Cancer Brother   . Healthy Son    Past Surgical History:  Procedure Laterality Date  . TOTAL HIP ARTHROPLASTY Left 02/15/2018   Procedure: TOTAL HIP ARTHROPLASTY ANTERIOR APPROACH;  Surgeon: [name], MD;  Location: [facility];  Service: Orthopedics;  Laterality: Left;  . WISDOM TOOTH EXTRACTION     Social History   Social History Narrative  . Not on file   Immunization History  Administered Date(s) Administered  . Pneumococcal Conjugate-13 07/14/2018  . Tdap 03/25/2018  . Zoster Recombinat (Shingrix) 07/14/2018     Objective: Vital Signs: There were no vitals taken for this visit.   Physical Exam   Musculoskeletal Exam: ***  CDAI Exam: CDAI Score: - Patient Global: -; Provider Global: - Swollen: -; Tender: - Joint Exam   No joint exam has been documented for this visit   There is currently no information documented on the homunculus. Go to the Rheumatology activity and complete the homunculus joint exam.  Investigation: No additional findings.  Imaging: No results found.  Recent Labs: Lab Results  Component Value Date   WBC 6.8 09/15/2018   HGB 15.1 09/15/2018   PLT 263 09/15/2018   NA 144 09/15/2018   K 6.0 (H) 09/15/2018   CL 106 09/15/2018   CO2 30 09/15/2018   GLUCOSE 94 09/15/2018   BUN 16 09/15/2018   CREATININE 0.99 09/15/2018   BILITOT 0.5 09/15/2018   ALKPHOS 87 01/15/2018   AST 11 09/15/2018   ALT 8 09/15/2018  PROT 6.9 09/15/2018   ALBUMIN 4.3 01/15/2018   CALCIUM 10.3 09/15/2018   GFRAA 72 09/15/2018   QFTBGOLDPLUS NEGATIVE 05/11/2018    Speciality Comments: No specialty comments available.  Procedures:  No procedures performed Allergies: Patient has no known allergies.   Assessment / Plan:     Visit Diagnoses: No diagnosis found.  Orders: No orders of the defined types were placed in this encounter.  No orders of the defined types were placed in  this encounter.   Face-to-face time spent with patient was *** minutes. Greater than 50% of time was spent in counseling and coordination of care.  Follow-Up Instructions: No follow-ups on file.   Ofilia Neas, PA-C  Note - This record has been created using Dragon software.  Chart creation errors have been sought, but may not always  have been located. Such creation errors do not reflect on  the standard of medical care.

## 2018-12-18 ENCOUNTER — Telehealth: Payer: Self-pay | Admitting: Internal Medicine

## 2018-12-20 ENCOUNTER — Ambulatory Visit: Payer: Medicaid Other | Admitting: Physician Assistant

## 2018-12-20 NOTE — Telephone Encounter (Signed)
Patient aware that office has been attempting to contact her. Documentation in another phone encounter.

## 2018-12-20 NOTE — Progress Notes (Deleted)
Office Visit Note  Patient: [name]             Date of Birth: 08/03/1960           MRN: [id number]             PCP: Ladell Pier, MD Referring: Ladell Pier, MD Visit Date: 12/20/2018 Occupation: @GUAROCC @  Subjective:  No chief complaint on file.   History of Present Illness: [name] is a 58 y.o. female ***   Activities of Daily Living:  Patient reports morning stiffness for *** {minute/hour:19697}.   Patient {ACTIONS;DENIES/REPORTS:21021675::"Denies"} nocturnal pain.  Difficulty dressing/grooming: {ACTIONS;DENIES/REPORTS:21021675::"Denies"} Difficulty climbing stairs: {ACTIONS;DENIES/REPORTS:21021675::"Denies"} Difficulty getting out of chair: {ACTIONS;DENIES/REPORTS:21021675::"Denies"} Difficulty using hands for taps, buttons, cutlery, and/or writing: {ACTIONS;DENIES/REPORTS:21021675::"Denies"}  No Rheumatology ROS completed.   PMFS History:  Patient Active Problem List   Diagnosis Date Noted  . Degenerative joint disease of shoulder region 06/08/2018  . 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 Rheumatoid arthritis involving both feet with positive rheumatoid factor ([place]) 06/08/2018  . 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 Status post total hip replacement, 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  . Smoker 06/08/2018  . Anxiety and depression 06/08/2018  . Family history of breast cancer 03/25/2018  . 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 Rheumatoid arthritis involving multiple sites ([place]) 01/15/2018  . Tobacco dependence 01/15/2018  . Opioid use agreement exists 01/15/2018  . Positive depression screening 01/15/2018  . Homeless 01/15/2018    Past Medical History:  Diagnosis Date  . Anxiety   . Bronchitis age 21  . Depression   . Hx of bladder infections    been over 5 years since last one   . Rheumatoid arthritis ([place])    unc rheum   . Tobacco abuse   . Trouble in sleeping     Family History  Problem  Relation Age of Onset  . Breast cancer Mother   . Cervical cancer Mother   . Cancer Brother   . Healthy Son    Past Surgical History:  Procedure Laterality Date  . TOTAL HIP ARTHROPLASTY Left 02/15/2018   Procedure: TOTAL HIP ARTHROPLASTY ANTERIOR APPROACH;  Surgeon: [name], MD;  Location: [facility];  Service: Orthopedics;  Laterality: Left;  . WISDOM TOOTH EXTRACTION     Social History   Social History Narrative  . Not on file   Immunization History  Administered Date(s) Administered  . Pneumococcal Conjugate-13 07/14/2018  . Tdap 03/25/2018  . Zoster Recombinat (Shingrix) 07/14/2018     Objective: Vital Signs: There were no vitals taken for this visit.   Physical Exam   Musculoskeletal Exam: ***  CDAI Exam: CDAI Score: - Patient Global: -; Provider Global: - Swollen: -; Tender: - Joint Exam   No joint exam has been documented for this visit   There is currently no information documented on the homunculus. Go to the Rheumatology activity and complete the homunculus joint exam.  Investigation: No additional findings.  Imaging: No results found.  Recent Labs: Lab Results  Component Value Date   WBC 6.8 09/15/2018   HGB 15.1 09/15/2018   PLT 263 09/15/2018   NA 144 09/15/2018   K 6.0 (H) 09/15/2018   CL 106 09/15/2018   CO2 30 09/15/2018   GLUCOSE 94 09/15/2018   BUN 16 09/15/2018   CREATININE 0.99 09/15/2018   BILITOT 0.5 09/15/2018   ALKPHOS 87 01/15/2018   AST 11 09/15/2018   ALT 8 09/15/2018  PROT 6.9 09/15/2018   ALBUMIN 4.3 01/15/2018   CALCIUM 10.3 09/15/2018   GFRAA 72 09/15/2018   QFTBGOLDPLUS NEGATIVE 05/11/2018    Speciality Comments: No specialty comments available.  Procedures:  No procedures performed Allergies: Patient has no known allergies.   Assessment / Plan:     Visit Diagnoses: No diagnosis found.  Orders: No orders of the defined types were placed in this encounter.  No orders of the defined types were placed in  this encounter.   Face-to-face time spent with patient was *** minutes. Greater than 50% of time was spent in counseling and coordination of care.  Follow-Up Instructions: No follow-ups on file.   Ofilia Neas, PA-C  Note - This record has been created using Dragon software.  Chart creation errors have been sought, but may not always  have been located. Such creation errors do not reflect on  the standard of medical care.

## 2018-12-21 NOTE — Telephone Encounter (Signed)
Medical Assistant left message on patient's home and cell voicemail. Voicemail states to give a call back to [place] with [facility] at [phone]. MA contacted patient to inform her of returning a phone call to [facility] regarding the scheduling of her colonoscopy.

## 2018-12-24 ENCOUNTER — Ambulatory Visit: Payer: Medicaid Other | Admitting: Rheumatology

## 2019-02-01 NOTE — Progress Notes (Signed)
Virtual Visit via Telephone Note  I connected with [name] on 02/02/19 at  2:45 PM EST by telephone and verified that I am speaking with the correct person using two identifiers.  Location: Patient: Home  Provider: Clinic This service was conducted via virtual visit.  The patient was located at home. I was located in my office.  Consent was obtained prior to the virtual visit and is aware of possible charges through their insurance for this visit.  The patient is an established patient.  Dr. Estanislado Pandy, MD conducted the virtual visit and Hazel Sams, PA-C acted as scribe during the service.  Office staff helped with scheduling follow up visits after the service was conducted.   I discussed the limitations, risks, security and privacy concerns of performing an evaluation and management service by telephone and the availability of in person appointments. I also discussed with the patient that there may be a patient responsible charge related to this service. The patient expressed understanding and agreed to proceed.  CC: Right shoulder joint pain History of Present Illness: Patient is a 58 year old female with a past medical history of seropositive rheumatoid arthritis. She is taking Otrexup 20 mg sq once weekly and folic acid 2 mg po daily. She has not noticed much improvement since switching from the tablets of MTX to the injectable MTX.  She has ongoing pain in the right shoulder joint and both hands.  She uses arthritis gloves, which help the discomfort and stiffness in the morning. She is not having any left elbow joint. She takes prednisone 1 tablet by mouth as needed for pain and inflammation.   Review of Systems  Constitutional: Negative for fever and malaise/fatigue.  Eyes: Negative for photophobia, pain, discharge and redness.  Respiratory: Negative for cough, shortness of breath and wheezing.   Cardiovascular: Negative for chest pain and palpitations.  Gastrointestinal: Positive for  diarrhea. Negative for blood in stool and constipation.  Genitourinary: Negative for dysuria.  Musculoskeletal: Positive for joint pain. Negative for back pain, myalgias and neck pain.       +Joint swelling +Morning stiffness   Skin: Negative for rash.  Neurological: Negative for dizziness and headaches.  Psychiatric/Behavioral: Positive for depression. The patient is not nervous/anxious and does not have insomnia.       Observations/Objective: Physical Exam  Constitutional: She is oriented to person, place, and time.  Neurological: She is alert and oriented to person, place, and time.  Psychiatric: Mood, memory, affect and judgment normal.   Patient reports morning stiffness for  30 minutes.   Patient reports nocturnal pain.  Difficulty dressing/grooming: Denies Difficulty climbing stairs: Denies Difficulty getting out of chair: Denies Difficulty using hands for taps, buttons, cutlery, and/or writing: Reports   Assessment and Plan: Visit Diagnoses: Rheumatoid arthritis involving multiple sites with positive rheumatoid factor ([place]): She has ongoing pain in the right shoulder joint and bilateral hand pain.  She has severe arthritis in the right shoulder joint but is not a candidate for surgery at this time.  She takes tramadol and prednisone 1 mg tablets as needed for pain inflammation.  We discouraged the use of prednisone.  She is on Otrexup 20 mg sq injections once weekly and folic acid 2 mg po daily. She will require more aggressive treatment to control her arthritis. We will discuss adding an anti-TNF at her follow up visit.  She will continue on Otrexup and folic acid as prescribed.  She will follow up in 2 months.  High risk medication use -Otrexup 20 mg sq once weekly and folic acid 2 mg po daily. CBC and CMP were drawn on 09/15/18.  She is overdue for lab work.   Rheumatoid arthritis of shoulder region [facility]) - Right shoulder joint with severe rheumatoid arthritis and protrusion.   I reviewed CT scan of her right shoulder which showed severe arthritis with protrusion of her humerus.  She has been evaluated by Dr. [name] who did not recommend surgery. She has persistent right shoulder joint pain.  Rheumatoid arthritis involving both hands with positive rheumatoid factor (HCC) - Severe erosive disease on the x-rays.  She has chronic pain in both hands and both wrist joints.  She uses arthritis gloves daily. She takes tramadol as needed for pain relief.  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  She has no discomfort at this time.  Status post total hip replacement, left - Performed by Dr. Mayer Camel in December 2019.   Doing well.  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   Anxiety and depression  Family history of breast cancer    Follow Up Instructions: She will follow up in 2 months.    I discussed the assessment and treatment plan with the patient. The patient was provided an opportunity to ask questions and all were answered. The patient agreed with the plan and demonstrated an understanding of the instructions.   The patient was advised to call back or seek an in-person evaluation if the symptoms worsen or if the condition fails to improve as anticipated.  I provided 22minutes of non-face-to-face time during this encounter.   Bo Merino, MD   Scribed by-  Hazel Sams, PA-C

## 2019-02-02 ENCOUNTER — Encounter: Payer: Self-pay | Admitting: Rheumatology

## 2019-02-02 ENCOUNTER — Telehealth (INDEPENDENT_AMBULATORY_CARE_PROVIDER_SITE_OTHER): Payer: Medicare Other | Admitting: Rheumatology

## 2019-02-02 ENCOUNTER — Other Ambulatory Visit: Payer: Self-pay

## 2019-02-02 ENCOUNTER — Other Ambulatory Visit: Payer: Self-pay | Admitting: *Deleted

## 2019-02-02 DIAGNOSIS — F32A Depression, unspecified: Secondary | ICD-10-CM

## 2019-02-02 DIAGNOSIS — M069 Rheumatoid arthritis, unspecified: Secondary | ICD-10-CM

## 2019-02-02 DIAGNOSIS — F17200 Nicotine dependence, unspecified, uncomplicated: Secondary | ICD-10-CM

## 2019-02-02 DIAGNOSIS — M05741 Rheumatoid arthritis with rheumatoid factor of right hand without organ or systems involvement: Secondary | ICD-10-CM | POA: Diagnosis not present

## 2019-02-02 DIAGNOSIS — F419 Anxiety disorder, unspecified: Secondary | ICD-10-CM

## 2019-02-02 DIAGNOSIS — Z96642 Presence of left artificial hip joint: Secondary | ICD-10-CM

## 2019-02-02 DIAGNOSIS — M05772 Rheumatoid arthritis with rheumatoid factor of left ankle and foot without organ or systems involvement: Secondary | ICD-10-CM

## 2019-02-02 DIAGNOSIS — F329 Major depressive disorder, single episode, unspecified: Secondary | ICD-10-CM

## 2019-02-02 DIAGNOSIS — Z79899 Other long term (current) drug therapy: Secondary | ICD-10-CM | POA: Diagnosis not present

## 2019-02-02 DIAGNOSIS — M0579 Rheumatoid arthritis with rheumatoid factor of multiple sites without organ or systems involvement: Secondary | ICD-10-CM | POA: Diagnosis not present

## 2019-02-02 DIAGNOSIS — Z803 Family history of malignant neoplasm of breast: Secondary | ICD-10-CM

## 2019-02-02 DIAGNOSIS — M05742 Rheumatoid arthritis with rheumatoid factor of left hand without organ or systems involvement: Secondary | ICD-10-CM

## 2019-02-02 DIAGNOSIS — M24522 Contracture, left elbow: Secondary | ICD-10-CM

## 2019-02-02 DIAGNOSIS — M05771 Rheumatoid arthritis with rheumatoid factor of right ankle and foot without organ or systems involvement: Secondary | ICD-10-CM

## 2019-02-02 MED ORDER — OTREXUP 20 MG/0.4ML ~~LOC~~ SOAJ: 20.0000 mg | SUBCUTANEOUS | 0 refills | Status: DC

## 2019-02-02 NOTE — Telephone Encounter (Signed)
Ok to refill 30-day supply

## 2019-02-02 NOTE — Telephone Encounter (Signed)
Last Visit: 09/15/2018 Next Visit: 02/02/2019 telemedicine  Labs: 09/15/2018   Attempted to contact patient and left message on machine to advise patient she is due to update labs (at a [facility] or [facility]).   Okay to refill 30 day supply of MTX?

## 2019-02-02 NOTE — Telephone Encounter (Signed)
Patient needs RF on MTX sent to [facility]. [phone].

## 2019-02-02 NOTE — Addendum Note (Signed)
Addended by: Francis Gaines C on: 02/02/2019 10:26 AM   Modules accepted: Orders

## 2019-02-08 ENCOUNTER — Ambulatory Visit: Payer: Medicaid Other | Admitting: Internal Medicine

## 2019-02-16 ENCOUNTER — Telehealth: Payer: Self-pay | Admitting: Rheumatology

## 2019-02-16 NOTE — Telephone Encounter (Signed)
I LMOM for patient to call, and schedule a follow up appointment for the first of Feb. 2021.

## 2019-02-16 NOTE — Telephone Encounter (Signed)
-----   Message from Shona Needles, RT sent at 02/08/2019  3:42 PM EST ----- Regarding: F/U IN OFFICE IN 2 MONTHS

## 2019-02-17 ENCOUNTER — Telehealth: Payer: Self-pay | Admitting: Gastroenterology

## 2019-02-17 NOTE — Telephone Encounter (Signed)
Patient l/m on v/m asking for a call back to schedule a colonoscopy. She states she missed a call last month & has call with no one returning her calls.

## 2019-02-18 ENCOUNTER — Telehealth: Payer: Self-pay

## 2019-02-18 ENCOUNTER — Other Ambulatory Visit: Payer: Self-pay

## 2019-02-18 DIAGNOSIS — Z1211 Encounter for screening for malignant neoplasm of colon: Secondary | ICD-10-CM

## 2019-02-18 MED ORDER — PEG 3350-KCL-NA BICARB-NACL 420 G PO SOLR: 4000.0000 mL | Freq: Once | ORAL | 0 refills | Status: AC

## 2019-02-18 NOTE — Telephone Encounter (Signed)
Returned patients call to schedule colonoscopy.  LVM for her to call office back on Monday.  Thanks [employer]

## 2019-02-18 NOTE — Telephone Encounter (Signed)
Gastroenterology Pre-Procedure Review    PATIENT REVIEW QUESTIONS: The patient responded to the following health history questions as indicated:    1. Are you having any GI issues? no 2. Do you have a personal history of Polyps? no 3. Do you have a family history of Colon Cancer or Polyps? no 4. Diabetes Mellitus? no 5. Joint replacements in the past 12 months?no 6. Major health problems in the past 3 months?no 7. Any artificial heart valves, MVP, or defibrillator?no    MEDICATIONS & ALLERGIES:    Patient reports the following regarding taking any anticoagulation/antiplatelet therapy:   Plavix, Coumadin, Eliquis, Xarelto, Lovenox, Pradaxa, Brilinta, or Effient? no Aspirin? no  Patient confirms/reports the following medications:  Current Outpatient Medications  Medication Sig Dispense Refill  . albuterol (VENTOLIN HFA) 108 (90 Base) MCG/ACT inhaler Inhale 2 puffs into the lungs every 6 (six) hours as needed for wheezing or shortness of breath. 18 g 2  . Cholecalciferol (VITAMIN D3) 75 MCG (3000 UT) TABS Take 3,000 Units by mouth daily.    . folic acid (FOLVITE) 1 MG tablet Take 2 tablets (2 mg total) by mouth daily. 180 tablet 3  . Methotrexate, PF, (OTREXUP) 20 MG/0.4ML SOAJ Inject 20 mg into the skin once a week. 4 pen 0  . Multiple Vitamin (MULTIVITAMIN WITH MINERALS) TABS tablet Take 1 tablet by mouth daily.    . polyethylene glycol-electrolytes (NULYTELY/GOLYTELY) 420 g solution Take 4,000 mLs by mouth once for 1 dose. 4000 mL 0  . predniSONE (DELTASONE) 1 MG tablet Take 1 tablet (1 mg total) by mouth daily with breakfast. 30 tablet 0  . TUBERCULIN SYR 1CC/27GX1/2" (B-D TB SYRINGE 1CC/27GX1/2") 27G X 1/2" 1 ML MISC 12 Syringes by Does not apply route once a week. 12 each 3   No current facility-administered medications for this visit.    Patient confirms/reports the following allergies:  No Known Allergies  No orders of the defined types were placed in this  encounter.   AUTHORIZATION INFORMATION Primary Insurance: 1D#: Group #:  Secondary Insurance: 1D#: Group #:  SCHEDULE INFORMATION: Date: 03/15/2019 Time: Location:[facility]

## 2019-02-23 ENCOUNTER — Other Ambulatory Visit: Payer: Self-pay | Admitting: Physician Assistant

## 2019-02-23 DIAGNOSIS — Z79899 Other long term (current) drug therapy: Secondary | ICD-10-CM

## 2019-02-24 ENCOUNTER — Ambulatory Visit: Payer: Medicare Other | Attending: Internal Medicine | Admitting: Internal Medicine

## 2019-02-24 ENCOUNTER — Other Ambulatory Visit: Payer: Self-pay

## 2019-02-24 DIAGNOSIS — M0579 Rheumatoid arthritis with rheumatoid factor of multiple sites without organ or systems involvement: Secondary | ICD-10-CM | POA: Diagnosis not present

## 2019-02-24 DIAGNOSIS — Z23 Encounter for immunization: Secondary | ICD-10-CM

## 2019-02-24 DIAGNOSIS — M71321 Other bursal cyst, right elbow: Secondary | ICD-10-CM

## 2019-02-24 LAB — CBC WITH DIFFERENTIAL/PLATELET
Absolute Monocytes: 350 cells/uL (ref 200–950)
Basophils Absolute: 40 cells/uL (ref 0–200)
Basophils Relative: 0.8 %
Eosinophils Absolute: 260 cells/uL (ref 15–500)
Eosinophils Relative: 5.2 %
HCT: 45.6 % — ABNORMAL HIGH (ref 35.0–45.0)
Hemoglobin: 15.6 g/dL — ABNORMAL HIGH (ref 11.7–15.5)
Lymphs Abs: 1690 cells/uL (ref 850–3900)
MCH: 29.3 pg (ref 27.0–33.0)
MCHC: 34.2 g/dL (ref 32.0–36.0)
MCV: 85.6 fL (ref 80.0–100.0)
MPV: 10.6 fL (ref 7.5–12.5)
Monocytes Relative: 7 %
Neutro Abs: 2660 cells/uL (ref 1500–7800)
Neutrophils Relative %: 53.2 %
Platelets: 298 10*3/uL (ref 140–400)
RBC: 5.33 10*6/uL — ABNORMAL HIGH (ref 3.80–5.10)
RDW: 13.9 % (ref 11.0–15.0)
Total Lymphocyte: 33.8 %
WBC: 5 10*3/uL (ref 3.8–10.8)

## 2019-02-24 LAB — COMPLETE METABOLIC PANEL WITH GFR
AG Ratio: 1.6 (calc) (ref 1.0–2.5)
ALT: 9 U/L (ref 6–29)
AST: 14 U/L (ref 10–35)
Albumin: 4.2 g/dL (ref 3.6–5.1)
Alkaline phosphatase (APISO): 84 U/L (ref 37–153)
BUN: 15 mg/dL (ref 7–25)
CO2: 27 mmol/L (ref 20–32)
Calcium: 9.7 mg/dL (ref 8.6–10.4)
Chloride: 104 mmol/L (ref 98–110)
Creat: 0.83 mg/dL (ref 0.50–0.99)
GFR, Est African American: 89 mL/min/{1.73_m2} (ref >=60)
GFR, Est Non African American: 77 mL/min/{1.73_m2} (ref >=60)
Globulin: 2.7 g/dL (calc) (ref 1.9–3.7)
Glucose, Bld: 89 mg/dL (ref 65–139)
Potassium: 4.7 mmol/L (ref 3.5–5.3)
Sodium: 141 mmol/L (ref 135–146)
Total Bilirubin: 0.5 mg/dL (ref 0.2–1.2)
Total Protein: 6.9 g/dL (ref 6.1–8.1)

## 2019-02-24 NOTE — Progress Notes (Signed)
Virtual Visit via Telephone Note Due to current restrictions/limitations of in-office visits due to the COVID-19 pandemic, this scheduled clinical appointment was converted to a telehealth visit  I connected with [name] on 02/24/19 at 1:43 p.m by telephone and verified that I am speaking with the correct person using two identifiers. I am in my office.  The patient is at home.  Only the patient and myself participated in this encounter.  I discussed the limitations, risks, security and privacy concerns of performing an evaluation and management service by telephone and the availability of in person appointments. I also discussed with the patient that there may be a patient responsible charge related to this service. The patient expressed understanding and agreed to proceed.   History of Present Illness: Patient with history of rheumatoid arthritis, tob dep, THR, anxiety/depression, stress incontinence  C/o having a cyst on her RT elbow which she has had for a while. Went down some when she was on Prednisone. Not painful but just annoying.  It is about the diameter of a quarter. RA:  On MTX inj once a wk and Prednisone as needed  Scheduled for c-scope and MMG after the 1st of new yr.  She is coming next wk for flu shot.    Outpatient Encounter Medications as of 02/24/2019  Medication Sig  . albuterol (VENTOLIN HFA) 108 (90 Base) MCG/ACT inhaler Inhale 2 puffs into the lungs every 6 (six) hours as needed for wheezing or shortness of breath.  . Cholecalciferol (VITAMIN D3) 75 MCG (3000 UT) TABS Take 3,000 Units by mouth daily.  . folic acid (FOLVITE) 1 MG tablet Take 2 tablets (2 mg total) by mouth daily.  . Methotrexate, PF, (OTREXUP) 20 MG/0.4ML SOAJ Inject 20 mg into the skin once a week.  . Multiple Vitamin (MULTIVITAMIN WITH MINERALS) TABS tablet Take 1 tablet by mouth daily.  . predniSONE (DELTASONE) 1 MG tablet Take 1 tablet (1 mg total) by mouth daily with breakfast. (Patient not  taking: Reported on 02/24/2019)  . TUBERCULIN SYR 1CC/27GX1/2" (B-D TB SYRINGE 1CC/27GX1/2") 27G X 1/2" 1 ML MISC 12 Syringes by Does not apply route once a week.   No facility-administered encounter medications on file as of 02/24/2019.      Observations/Objective: No direct observation done as this was a telephone encounter  Assessment and Plan: 1. Other bursal cyst, right elbow Could be a bursal cyst versus rheumatoid nodule.  She will have her rheumatologist take a look at this on her next visit with her  2. Rheumatoid arthritis involving multiple sites with positive rheumatoid factor ([place]) Followed by rheumatology.  3. Need for influenza vaccination Plans to come in for nurse only visit for flu vaccine next week  She will keep appointment for colonoscopy and mammogram that she states that she is scheduled for after the first of the year.   Follow Up Instructions: 4 to 5 months   I discussed the assessment and treatment plan with the patient. The patient was provided an opportunity to ask questions and all were answered. The patient agreed with the plan and demonstrated an understanding of the instructions.   The patient was advised to call back or seek an in-person evaluation if the symptoms worsen or if the condition fails to improve as anticipated.  I provided 8 minutes of non-face-to-face time during this encounter.   Karle Plumber, MD

## 2019-02-24 NOTE — Progress Notes (Signed)
RBC, hgb, Hct are mildly elevated but stable.  Rest of CBC WNL.  CMP WNL.

## 2019-03-02 ENCOUNTER — Ambulatory Visit: Payer: Medicare Other | Admitting: Pharmacist

## 2019-03-10 ENCOUNTER — Other Ambulatory Visit: Payer: Self-pay

## 2019-03-10 ENCOUNTER — Other Ambulatory Visit
Admission: RE | Admit: 2019-03-10 | Discharge: 2019-03-10 | Disposition: A | Discharge status: Home / Self Care | Payer: Medicare Other | Source: Ambulatory Visit | Attending: Gastroenterology | Admitting: Gastroenterology

## 2019-03-10 DIAGNOSIS — Z01812 Encounter for preprocedural laboratory examination: Secondary | ICD-10-CM | POA: Diagnosis present

## 2019-03-10 DIAGNOSIS — Z20828 Contact with and (suspected) exposure to other viral communicable diseases: Secondary | ICD-10-CM | POA: Diagnosis not present

## 2019-03-11 LAB — SARS CORONAVIRUS 2 (TAT 6-24 HRS): SARS Coronavirus 2: NEGATIVE

## 2019-03-14 ENCOUNTER — Encounter: Payer: Self-pay | Admitting: Gastroenterology

## 2019-03-14 ENCOUNTER — Telehealth: Payer: Self-pay | Admitting: Internal Medicine

## 2019-03-14 MED ORDER — ALBUTEROL SULFATE HFA 108 (90 BASE) MCG/ACT IN AERS: 2.0000 | INHALATION_SPRAY | Freq: Four times a day (QID) | RESPIRATORY_TRACT | 2 refills | Status: DC | PRN

## 2019-03-14 NOTE — Telephone Encounter (Signed)
Pt would like a nebulizer due to her recent trouble breathing. Please advise

## 2019-03-14 NOTE — Telephone Encounter (Signed)
Could you contact pt to schedule a televisit for a afternoon

## 2019-03-14 NOTE — Telephone Encounter (Signed)
Rx sent 

## 2019-03-14 NOTE — Telephone Encounter (Signed)
Will forward to pcp to see if appropriate

## 2019-03-14 NOTE — Telephone Encounter (Signed)
Fill if appropriate

## 2019-03-14 NOTE — Telephone Encounter (Signed)
1) Medication(s) Requested (by name): -albuterol (VENTOLIN HFA) 108 (90 Base) MCG/ACT inhaler   2) Pharmacy of Choice: -[id number] - [place], [place]

## 2019-03-15 ENCOUNTER — Encounter
Admission: RE | Disposition: A | Discharge status: Home / Self Care | Payer: Self-pay | Source: Home / Self Care | Attending: Gastroenterology

## 2019-03-15 ENCOUNTER — Other Ambulatory Visit: Payer: Self-pay

## 2019-03-15 ENCOUNTER — Encounter: Payer: Self-pay | Admitting: Gastroenterology

## 2019-03-15 ENCOUNTER — Ambulatory Visit: Payer: Medicare Other | Admitting: Anesthesiology

## 2019-03-15 ENCOUNTER — Ambulatory Visit
Admission: RE | Admit: 2019-03-15 | Discharge: 2019-03-15 | Disposition: A | Discharge status: Home / Self Care | Payer: Medicare Other | Attending: Gastroenterology | Admitting: Gastroenterology

## 2019-03-15 DIAGNOSIS — M069 Rheumatoid arthritis, unspecified: Secondary | ICD-10-CM | POA: Insufficient documentation

## 2019-03-15 DIAGNOSIS — Z87891 Personal history of nicotine dependence: Secondary | ICD-10-CM | POA: Insufficient documentation

## 2019-03-15 DIAGNOSIS — Z79899 Other long term (current) drug therapy: Secondary | ICD-10-CM | POA: Insufficient documentation

## 2019-03-15 DIAGNOSIS — K635 Polyp of colon: Secondary | ICD-10-CM

## 2019-03-15 DIAGNOSIS — F419 Anxiety disorder, unspecified: Secondary | ICD-10-CM | POA: Insufficient documentation

## 2019-03-15 DIAGNOSIS — Z7952 Long term (current) use of systemic steroids: Secondary | ICD-10-CM | POA: Diagnosis not present

## 2019-03-15 DIAGNOSIS — F329 Major depressive disorder, single episode, unspecified: Secondary | ICD-10-CM | POA: Diagnosis not present

## 2019-03-15 DIAGNOSIS — D122 Benign neoplasm of ascending colon: Secondary | ICD-10-CM | POA: Insufficient documentation

## 2019-03-15 DIAGNOSIS — J449 Chronic obstructive pulmonary disease, unspecified: Secondary | ICD-10-CM | POA: Diagnosis not present

## 2019-03-15 DIAGNOSIS — Z1211 Encounter for screening for malignant neoplasm of colon: Secondary | ICD-10-CM

## 2019-03-15 DIAGNOSIS — D125 Benign neoplasm of sigmoid colon: Secondary | ICD-10-CM | POA: Insufficient documentation

## 2019-03-15 DIAGNOSIS — K5730 Diverticulosis of large intestine without perforation or abscess without bleeding: Secondary | ICD-10-CM | POA: Diagnosis not present

## 2019-03-15 HISTORY — PX: COLONOSCOPY WITH PROPOFOL: SHX5780

## 2019-03-15 SURGERY — COLONOSCOPY WITH PROPOFOL
Anesthesia: General

## 2019-03-15 MED ORDER — SODIUM CHLORIDE 0.9 % IV SOLN
INTRAVENOUS | Status: DC
  Administered 2019-03-15: 09:00:00 1000 mL via INTRAVENOUS

## 2019-03-15 MED ORDER — LIDOCAINE HCL (CARDIAC) PF 100 MG/5ML IV SOSY
PREFILLED_SYRINGE | INTRAVENOUS | Status: DC | PRN
  Administered 2019-03-15: 50 mg via INTRAVENOUS

## 2019-03-15 MED ORDER — PROPOFOL 10 MG/ML IV BOLUS
INTRAVENOUS | Status: DC | PRN
  Administered 2019-03-15: 50 mg via INTRAVENOUS
  Administered 2019-03-15: 20 mg via INTRAVENOUS
  Administered 2019-03-15: 135 mg via INTRAVENOUS

## 2019-03-15 NOTE — Transfer of Care (Signed)
Immediate Anesthesia Transfer of Care Note  Patient: [name]  Procedure(s) Performed: COLONOSCOPY WITH PROPOFOL (N/A )  Patient Location: PACU  Anesthesia Type:General  Level of Consciousness: awake, alert  and oriented  Airway & Oxygen Therapy: Patient Spontanous Breathing  Post-op Assessment: Report given to RN and Post -op Vital signs reviewed and stable  Post vital signs: Reviewed and stable  Last Vitals:  Vitals Value Taken Time  BP 92/63 03/15/19 1119  Temp    Pulse 81 03/15/19 1119  Resp 25 03/15/19 1119  SpO2 99 % 03/15/19 1119  Vitals shown include unvalidated device data.  Last Pain:  Vitals:   03/15/19 0855  TempSrc: Skin         Complications: No apparent anesthesia complications

## 2019-03-15 NOTE — H&P (Signed)
Jonathon Bellows, MD [address] [place], [place], [place], [address] [address], [place], [place], [place], [address] Phone: [phone]  Fax: [phone]  Primary Care Physician:  Ladell Pier, MD   Pre-Procedure History & Physical: HPI:  [name] is a 60 y.o. female is here for an colonoscopy.   Past Medical History:  Diagnosis Date  . Anxiety   . Bronchitis age 7  . Depression   . Hx of bladder infections    been over 5 years since last one   . Rheumatoid arthritis ([place])    unc rheum   . Tobacco abuse   . Trouble in sleeping     Past Surgical History:  Procedure Laterality Date  . JOINT REPLACEMENT    . TOTAL HIP ARTHROPLASTY Left 02/15/2018   Procedure: TOTAL HIP ARTHROPLASTY ANTERIOR APPROACH;  Surgeon: [name], MD;  Location: [facility];  Service: Orthopedics;  Laterality: Left;  . WISDOM TOOTH EXTRACTION      Prior to Admission medications   Medication Sig Start Date End Date Taking? Authorizing Provider  albuterol (VENTOLIN HFA) 108 (90 Base) MCG/ACT inhaler Inhale 2 puffs into the lungs every 6 (six) hours as needed for wheezing or shortness of breath. 03/14/19  Yes Ladell Pier, MD  Cholecalciferol (VITAMIN D3) 75 MCG (3000 UT) TABS Take 3,000 Units by mouth daily.    [provider]  folic acid (FOLVITE) 1 MG tablet Take 2 tablets (2 mg total) by mouth daily. 09/20/18   Bo Merino, MD  Methotrexate, PF, (OTREXUP) 20 MG/0.4ML SOAJ Inject 20 mg into the skin once a week. 02/02/19   Ofilia Neas, PA-C  Multiple Vitamin (MULTIVITAMIN WITH MINERALS) TABS tablet Take 1 tablet by mouth daily.    [provider]  predniSONE (DELTASONE) 1 MG tablet Take 1 tablet (1 mg total) by mouth daily with breakfast. Patient not taking: Reported on 02/24/2019 03/25/18   Ladell Pier, MD  TUBERCULIN SYR 1CC/27GX1/2" (B-D TB SYRINGE 1CC/27GX1/2") 27G X 1/2" 1 ML MISC 12 Syringes by Does not apply route once a week. 09/20/18    Bo Merino, MD    Allergies as of 02/18/2019  . (No Known Allergies)    Family History  Problem Relation Age of Onset  . Breast cancer Mother   . Cervical cancer Mother   . Cancer Brother   . Healthy Son     Social History   Socioeconomic History  . Marital status: Single    Spouse name: Not on file  . Number of children: 1  . Years of education: Not on file  . Highest education level: Not on file  Occupational History  . Not on file  Tobacco Use  . Smoking status: Former Smoker    Packs/day: 0.25    Types: Cigarettes    Quit date: 08/13/2018    Years since quitting: 0.5  . Smokeless tobacco: Never Used  Substance and Sexual Activity  . Alcohol use: Yes    Comment: rarely   . Drug use: Not Currently    Types: Cocaine    Comment: last used in 2004  . Sexual activity: Not on file  Other Topics Concern  . Not on file  Social History Narrative  . Not on file   Social Determinants of Health   Financial Resource Strain:   . Difficulty of Paying Living Expenses: Not on file  Food Insecurity:   . Worried About Charity fundraiser in  the Last Year: Not on file  . Ran Out of Food in the Last Year: Not on file  Transportation Needs:   . Lack of Transportation (Medical): Not on file  . Lack of Transportation (Non-Medical): Not on file  Physical Activity:   . Days of Exercise per Week: Not on file  . Minutes of Exercise per Session: Not on file  Stress:   . Feeling of Stress : Not on file  Social Connections:   . Frequency of Communication with Friends and Family: Not on file  . Frequency of Social Gatherings with Friends and Family: Not on file  . Attends Religious Services: Not on file  . Active Member of Clubs or Organizations: Not on file  . Attends Archivist Meetings: Not on file  . Marital Status: Not on file  Intimate Partner Violence:   . Fear of Current or Ex-Partner: Not on file  . Emotionally Abused: Not on file  . Physically Abused:  Not on file  . Sexually Abused: Not on file    Review of Systems: See HPI, otherwise negative ROS  Physical Exam: BP 107/83   Pulse (!) 102   Temp (!) 97 F (36.1 C) (Skin)   Resp 18   Ht 5' 5.5" (1.664 m)   Wt 49.9 kg   SpO2 98%   BMI 18.03 kg/m  General:   Alert,  pleasant and cooperative in NAD Head:  Normocephalic and atraumatic. Neck:  Supple; no masses or thyromegaly. Lungs:  Clear throughout to auscultation, normal respiratory effort.    Heart:  +S1, +S2, Regular rate and rhythm, No edema. Abdomen:  Soft, nontender and nondistended. Normal bowel sounds, without guarding, and without rebound.   Neurologic:  Alert and  oriented x4;  grossly normal neurologically.  Impression/Plan: [name] is here for an colonoscopy to be performed for Screening colonoscopy average risk   Risks, benefits, limitations, and alternatives regarding  colonoscopy have been reviewed with the patient.  Questions have been answered.  All parties agreeable.   Jonathon Bellows, MD  03/15/2019, 10:50 AM

## 2019-03-15 NOTE — Op Note (Signed)
[facility] Gastroenterology Patient Name: [name] Procedure Date: 03/15/2019 8:51 AM MRN: [id number] Account #: [id number] Date of Birth: [birth year] Admit Type: Outpatient Age: 59 Room: [facility] ENDO ROOM 1 Gender: Female Note Status: Finalized Procedure:             Colonoscopy Indications:           Screening for colorectal malignant neoplasm Providers:             Jonathon Bellows MD, MD Medicines:             Monitored Anesthesia Care Complications:         No immediate complications. Procedure:             Pre-Anesthesia Assessment:                        - Prior to the procedure, a History and Physical was                         performed, and patient medications, allergies and                         sensitivities were reviewed. The patient's tolerance                         of previous anesthesia was reviewed.                        - The risks and benefits of the procedure and the                         sedation options and risks were discussed with the                         patient. All questions were answered and informed                         consent was obtained.                        - ASA Grade Assessment: II - A patient with mild                         systemic disease.                        After obtaining informed consent, the colonoscope was                         passed under direct vision. Throughout the procedure,                         the patient's blood pressure, pulse, and oxygen                         saturations were monitored continuously. The                         Colonoscope was introduced through the anus and  advanced to the the cecum, identified by the                         appendiceal orifice. The Colonoscope was introduced                         through the anus and advanced to the. The colonoscopy                         was performed with ease. The patient tolerated the    procedure well. The quality of the bowel preparation                         was adequate. Findings:      The perianal and digital rectal examinations were normal.      Multiple small-mouthed diverticula were found in the sigmoid colon.      A 5 mm polyp was found in the sigmoid colon. The polyp was sessile. The       polyp was removed with a cold snare. Resection and retrieval were       complete.      A 15 mm polyp was found in the proximal ascending colon. The polyp was       semi-sessile. Preparations were made for mucosal resection. Saline was       injected to raise the lesion. Snare mucosal resection was performed.       Resection and retrieval were complete. To prevent bleeding after mucosal       resection, one hemostatic clip was successfully placed. There was no       bleeding during, or at the end, of the procedure.      The exam was otherwise without abnormality on direct and retroflexion       views. Impression:            - Diverticulosis in the sigmoid colon.                        - One 5 mm polyp in the sigmoid colon, removed with a                         cold snare. Resected and retrieved.                        - One 15 mm polyp in the proximal ascending colon,                         removed with mucosal resection. Resected and                         retrieved. Clip was placed.                        - The examination was otherwise normal on direct and                         retroflexion views.                        - Mucosal resection was performed. Resection and  retrieval were complete. Recommendation:        - Discharge patient to home (with escort).                        - Resume previous diet.                        - Continue present medications.                        - Await pathology results.                        - Repeat colonoscopy for surveillance based on                         pathology results. Procedure Code(s):      --- Professional ---                        [phone], Colonoscopy, flexible; with endoscopic mucosal                         resection                        [id number], 29, Colonoscopy, flexible; with removal of                         tumor(s), polyp(s), or other lesion(s) by snare                         technique Diagnosis Code(s):     --- Professional ---                        Z12.11, Encounter for screening for malignant neoplasm                         of colon                        K63.5, Polyp of colon                        K57.30, Diverticulosis of large intestine without                         perforation or abscess without bleeding CPT copyright 2019 American Medical Association. All rights reserved. The codes documented in this report are preliminary and upon coder review may  be revised to meet current compliance requirements. Jonathon Bellows, MD Jonathon Bellows MD, MD 03/15/2019 11:17:27 AM This report has been signed electronically. Number of Addenda: 0 Note Initiated On: 03/15/2019 8:51 AM Scope Withdrawal Time: 0 hours 16 minutes 40 seconds  Total Procedure Duration: 0 hours 20 minutes 0 seconds  Estimated Blood Loss:  Estimated blood loss: none.      [facility]

## 2019-03-15 NOTE — Anesthesia Preprocedure Evaluation (Signed)
Anesthesia Evaluation  Patient identified by MRN, date of birth, ID band Patient awake    Reviewed: Allergy & Precautions, NPO status , Patient's Chart, lab work & pertinent test results  History of Anesthesia Complications Negative for: history of anesthetic complications  Airway Mallampati: II       Dental  (+) Partial Lower   Pulmonary asthma , neg sleep apnea, COPD,  COPD inhaler, Patient abstained from smoking.Not current smoker, former smoker,           Cardiovascular (-) hypertension(-) Past MI and (-) CHF (-) dysrhythmias (-) Valvular Problems/Murmurs     Neuro/Psych neg Seizures Anxiety Depression    GI/Hepatic Neg liver ROS, neg GERD  ,  Endo/Other  neg diabetes  Renal/GU negative Renal ROS     Musculoskeletal  (+) Arthritis , Rheumatoid disorders,    Abdominal   Peds  Hematology   Anesthesia Other Findings   Reproductive/Obstetrics                            Anesthesia Physical Anesthesia Plan  ASA: II  Anesthesia Plan: General   Post-op Pain Management:    Induction: Intravenous  PONV Risk Score and Plan: 3 and Propofol infusion, TIVA and Treatment may vary due to age or medical condition  Airway Management Planned: Nasal Cannula  Additional Equipment:   Intra-op Plan:   Post-operative Plan:   Informed Consent: I have reviewed the patients History and Physical, chart, labs and discussed the procedure including the risks, benefits and alternatives for the proposed anesthesia with the patient or authorized representative who has indicated his/her understanding and acceptance.       Plan Discussed with:   Anesthesia Plan Comments:         Anesthesia Quick Evaluation

## 2019-03-15 NOTE — Anesthesia Postprocedure Evaluation (Signed)
Anesthesia Post Note  Patient: [name]  Procedure(s) Performed: COLONOSCOPY WITH PROPOFOL (N/A )  Patient location during evaluation: Endoscopy Anesthesia Type: General Level of consciousness: awake and alert Pain management: pain level controlled Vital Signs Assessment: post-procedure vital signs reviewed and stable Respiratory status: spontaneous breathing, nonlabored ventilation, respiratory function stable and patient connected to nasal cannula oxygen Cardiovascular status: blood pressure returned to baseline and stable Postop Assessment: no apparent nausea or vomiting Anesthetic complications: no     Last Vitals:  Vitals:   03/15/19 1129 03/15/19 1139  BP: 116/67 135/80  Pulse: 81 77  Resp: (!) 24 (!) 34  Temp:    SpO2: 100% 94%    Last Pain:  Vitals:   03/15/19 1139  TempSrc:   PainSc: 0-No pain                 Martha Clan

## 2019-03-16 ENCOUNTER — Encounter: Payer: Self-pay | Admitting: *Deleted

## 2019-03-16 ENCOUNTER — Encounter: Payer: Self-pay | Admitting: Internal Medicine

## 2019-03-16 DIAGNOSIS — D126 Benign neoplasm of colon, unspecified: Secondary | ICD-10-CM | POA: Insufficient documentation

## 2019-03-16 LAB — SURGICAL PATHOLOGY

## 2019-03-18 ENCOUNTER — Encounter: Payer: Self-pay | Admitting: Gastroenterology

## 2019-03-25 ENCOUNTER — Telehealth: Payer: Self-pay | Admitting: Rheumatology

## 2019-03-25 MED ORDER — OTREXUP 20 MG/0.4ML ~~LOC~~ SOAJ: 20.0000 mg | SUBCUTANEOUS | 0 refills | Status: DC

## 2019-03-25 NOTE — Telephone Encounter (Signed)
Last Visit: 02/02/19 Next Visit: 05/03/19 Labs: 02/23/19 RBC, hgb, Hct are mildly elevated but stable. Rest of CBC WNL. CMP WNL.    Okay to refill per Dr. Estanislado Pandy   Patient advised.

## 2019-03-25 NOTE — Telephone Encounter (Signed)
Patient called requesting prescription refill of Methotrexate.  Patient states her pharmacy is [facility] in [place], but the last time the prescription was filled it had to be sent to [facility].  Patient is requesting a return call to let her know which pharmacy the prescription is sent to.

## 2019-03-28 ENCOUNTER — Telehealth: Payer: Self-pay | Admitting: Rheumatology

## 2019-03-28 MED ORDER — OTREXUP 20 MG/0.4ML ~~LOC~~ SOAJ: 20.0000 mg | SUBCUTANEOUS | 0 refills | Status: DC

## 2019-03-28 MED FILL — OTREXUP 20 MG/0.4 ML AUTO-I: 20 | 28 days supply | Qty: 2 | Fill #0

## 2019-03-28 NOTE — Telephone Encounter (Signed)
Patient states [facility], but patient would like for it to be sent to [facility] like it was the first time, if possible. Please call patient to advise.

## 2019-03-28 NOTE — Telephone Encounter (Signed)
Attempted to contact the patient and left message for patient to call the office.  

## 2019-03-28 NOTE — Telephone Encounter (Signed)
Left message to advise patient prescription has been resent to [facility].

## 2019-03-28 NOTE — Telephone Encounter (Signed)
Patient left a voicemail stating "I just spoke with someone concerning my prescription of Methotrexate and I need to speak with someone again."

## 2019-03-29 ENCOUNTER — Telehealth: Payer: Self-pay | Admitting: Pharmacy Technician

## 2019-03-29 NOTE — Telephone Encounter (Signed)
Submitted a Prior Authorization request to [place] [employer] for [place] via Cover My Meds. Will update once we receive a response.

## 2019-03-30 NOTE — Telephone Encounter (Signed)
Pt scheduled  

## 2019-03-30 NOTE — Telephone Encounter (Signed)
Received notification from [place] regarding a prior authorization for OTREXUP. Authorization has been APPROVED from 03/11/19 to 03/28/20.   Will send document to scan center.

## 2019-04-01 ENCOUNTER — Ambulatory Visit: Payer: Medicare Other | Attending: Internal Medicine | Admitting: Internal Medicine

## 2019-04-01 ENCOUNTER — Other Ambulatory Visit: Payer: Self-pay

## 2019-04-01 DIAGNOSIS — M0579 Rheumatoid arthritis with rheumatoid factor of multiple sites without organ or systems involvement: Secondary | ICD-10-CM | POA: Diagnosis not present

## 2019-04-01 DIAGNOSIS — M19011 Primary osteoarthritis, right shoulder: Secondary | ICD-10-CM

## 2019-04-01 DIAGNOSIS — J4540 Moderate persistent asthma, uncomplicated: Secondary | ICD-10-CM

## 2019-04-01 MED ORDER — TRAMADOL HCL 50 MG PO TABS: 50.0000 mg | ORAL_TABLET | Freq: Two times a day (BID) | ORAL | 0 refills | Status: DC | PRN

## 2019-04-01 MED ORDER — BUDESONIDE-FORMOTEROL FUMARATE 80-4.5 MCG/ACT IN AERO: 2.0000 | INHALATION_SPRAY | Freq: Two times a day (BID) | RESPIRATORY_TRACT | 3 refills | Status: DC

## 2019-04-01 NOTE — Progress Notes (Signed)
Virtual Visit via Telephone Note Due to current restrictions/limitations of in-office visits due to the COVID-19 pandemic, this scheduled clinical appointment was converted to a telehealth visit  I connected with [name] on 04/01/19 at 3:02 p.m by telephone and verified that I am speaking with the correct person using two identifiers. I am in my office.  The patient is at home.  Only the patient and myself participated in this encounter.  I discussed the limitations, risks, security and privacy concerns of performing an evaluation and management service by telephone and the availability of in person appointments. I also discussed with the patient that there may be a patient responsible charge related to this service. The patient expressed understanding and agreed to proceed.   History of Present Illness: Patient with history of rheumatoid arthritis, tob dep,THR,anxiety/depression, stress incontinence.  Today's visit is for chronic ds management  Thinks she has COPD from yrs of smoking. Having to use Albuterol inhaler 2-3 x a day for the past 4 mth.  Uses at night occasionally.  Does not seem to be as effective as before.  Thinks she needs a neb machine.  Has a persistent dry cough.  Drinking honey and lemon helps to product more mucus.  + intermittent wheezing  -moved to a different place and has to use a wood burning stove.  She feels this has affected her breathing more.  --smoking less.  "If I'm around someone who smokes I may bum one."  Still using the patches.   Requested RF on Tramadol to use as needed. Some days pain in RT shoulder affects sleep.  Uses sparingly. RA:  Has appt with her rhematology later this mth. She tells me the plan is to increase the methotrexate Outpatient Encounter Medications as of 04/01/2019  Medication Sig  . albuterol (VENTOLIN HFA) 108 (90 Base) MCG/ACT inhaler Inhale 2 puffs into the lungs every 6 (six) hours as needed for wheezing or shortness of breath.   . Cholecalciferol (VITAMIN D3) 75 MCG (3000 UT) TABS Take 3,000 Units by mouth daily.  . folic acid (FOLVITE) 1 MG tablet Take 2 tablets (2 mg total) by mouth daily.  . Methotrexate, PF, (OTREXUP) 20 MG/0.4ML SOAJ Inject 20 mg into the skin once a week.  . Multiple Vitamin (MULTIVITAMIN WITH MINERALS) TABS tablet Take 1 tablet by mouth daily.  . TUBERCULIN SYR 1CC/27GX1/2" (B-D TB SYRINGE 1CC/27GX1/2") 27G X 1/2" 1 ML MISC 12 Syringes by Does not apply route once a week.  . predniSONE (DELTASONE) 1 MG tablet Take 1 tablet (1 mg total) by mouth daily with breakfast. (Patient not taking: Reported on 02/24/2019)   No facility-administered encounter medications on file as of 04/01/2019.     Observations/Objective:   Assessment and Plan: 1. Moderate persistent reactive airway disease without complication I recommend adding a controller inhaler to see if we can get her symptoms under better control.  Patient is agreeable to this.  I explained the rationale for it.  I think that with use of Symbicort she will not have to use the rescue inhaler as much.  Again strongly advised her to discontinue smoking completely - budesonide-formoterol (SYMBICORT) 80-4.5 MCG/ACT inhaler; Inhale 2 puffs into the lungs 2 (two) times daily.  Dispense: 1 Inhaler; Refill: 3  2. Rheumatoid arthritis involving multiple sites with positive rheumatoid factor ([place]) Followed by rheumatology  3. Arthritis of shoulder region, right Limited refill given on tramadol.  Patient uses this medication sparingly.  [place] controlled substance reporting system  reviewed - traMADol (ULTRAM) 50 MG tablet; Take 1 tablet (50 mg total) by mouth every 12 (twelve) hours as needed.  Dispense: 30 tablet; Refill: 0   Follow Up Instructions: PRN   I discussed the assessment and treatment plan with the patient. The patient was provided an opportunity to ask questions and all were answered. The patient agreed with the plan and  demonstrated an understanding of the instructions.   The patient was advised to call back or seek an in-person evaluation if the symptoms worsen or if the condition fails to improve as anticipated.  I provided 15 minutes of non-face-to-face time during this encounter.   Karle Plumber, MD

## 2019-04-01 NOTE — Progress Notes (Signed)
Pt is wanting to speak with provider about getting a nebulizer machine  Pt states she is needing a refill on Tramadol

## 2019-05-02 NOTE — Progress Notes (Signed)
Office Visit Note  Patient: [name]             Date of Birth: 02-22-1961           MRN: [id number]             PCP: Ladell Pier, MD Referring: Ladell Pier, MD Visit Date: 05/03/2019 Occupation: @GUAROCC @  Subjective:  Discuss medication options   History of Present Illness: [name] is a 59 y.o. female with history of seropositive rheumatoid arthritis.  She is on Otrexup 20 mg of days injections once weekly and folic acid 2 mg by mouth daily.  Her most recent injection was today.  She continues to have chronic pain and inflammation in both hands, both wrist joints, and both shoulder joints.  She has difficulty with ADLs due to the discomfort and limited ROM of both hands and both shoulder joints.  She takes tramadol 50 mg 1 tablet every 12 hours as needed for pain relief.   Activities of Daily Living:  Patient reports joint stiffness all day  Patient Reports nocturnal pain.  Difficulty dressing/grooming: Reports Difficulty climbing stairs: Denies Difficulty getting out of chair: Denies Difficulty using hands for taps, buttons, cutlery, and/or writing: Reports  Review of Systems  Constitutional: Positive for fatigue.  HENT: Negative for mouth sores, mouth dryness and nose dryness.   Eyes: Negative for pain, itching, visual disturbance and dryness.  Respiratory: Negative for cough, hemoptysis, shortness of breath and difficulty breathing.   Cardiovascular: Negative for chest pain, palpitations, hypertension and swelling in legs/feet.  Gastrointestinal: Positive for diarrhea. Negative for blood in stool, constipation, nausea and vomiting.  Endocrine: Negative for increased urination.  Genitourinary: Negative for difficulty urinating and painful urination.  Musculoskeletal: Positive for arthralgias, joint pain, joint swelling and morning stiffness. Negative for myalgias, muscle weakness, muscle tenderness and myalgias.  Skin: Negative for color change, pallor,  rash, hair loss, nodules/bumps, skin tightness, ulcers and sensitivity to sunlight.  Allergic/Immunologic: Negative for susceptible to infections.  Neurological: Negative for dizziness, numbness, headaches and memory loss.  Hematological: Negative for swollen glands.  Psychiatric/Behavioral: Negative for depressed mood, confusion and sleep disturbance. The patient is not nervous/anxious.     PMFS History:  Patient Active Problem List   Diagnosis Date Noted  . Adenomatous colon polyp 03/16/2019  . Degenerative joint disease of shoulder region 06/08/2018  . Rheumatoid arthritis involving both hands with positive rheumatoid factor ([place]) 06/08/2018  . Rheumatoid arthritis involving both feet with positive rheumatoid factor ([place]) 06/08/2018  . 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 Status post total hip replacement, 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  . Smoker 06/08/2018  . Anxiety and depression 06/08/2018  . Family history of breast cancer 03/25/2018  . 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 Rheumatoid arthritis involving multiple sites ([place]) 01/15/2018  . Tobacco dependence 01/15/2018  . Opioid use agreement exists 01/15/2018  . Positive depression screening 01/15/2018  . Homeless 01/15/2018    Past Medical History:  Diagnosis Date  . Anxiety   . Bronchitis age 17  . Depression   . Hx of bladder infections    been over 5 years since last one   . Rheumatoid arthritis ([place])    unc rheum   . Tobacco abuse   . Trouble in sleeping     Family History  Problem Relation Age of Onset  . Breast cancer Mother   . Cervical cancer Mother   . Cancer Brother   . Healthy Son  Past Surgical History:  Procedure Laterality Date  . COLONOSCOPY WITH PROPOFOL N/A 03/15/2019   Procedure: COLONOSCOPY WITH PROPOFOL;  Surgeon: Jonathon Bellows, MD;  Location: [facility] ENDOSCOPY;  Service: Gastroenterology;  Laterality: N/A;  . JOINT REPLACEMENT    . TOTAL HIP ARTHROPLASTY Left 02/15/2018   Procedure: TOTAL HIP ARTHROPLASTY  ANTERIOR APPROACH;  Surgeon: [name], MD;  Location: [facility] ORS;  Service: Orthopedics;  Laterality: Left;  . WISDOM TOOTH EXTRACTION     Social History   Social History Narrative  . Not on file   Immunization History  Administered Date(s) Administered  . Pneumococcal Conjugate-13 07/14/2018  . Tdap 03/25/2018  . Zoster Recombinat (Shingrix) 07/14/2018     Objective: Vital Signs: BP 94/64 (BP Location: Left Arm, Patient Position: Sitting, Cuff Size: Normal)   Pulse 93   Resp 17   Ht 5' 5.5" (1.664 m)   Wt 101 lb (45.8 kg)   BMI 16.55 kg/m    Physical Exam Vitals and nursing note reviewed.  Constitutional:      Appearance: She is well-developed.  HENT:     Head: Normocephalic and atraumatic.  Eyes:     Conjunctiva/sclera: Conjunctivae normal.  Cardiovascular:     Rate and Rhythm: Normal rate and regular rhythm.     Heart sounds: Normal heart sounds.  Pulmonary:     Effort: Pulmonary effort is normal.     Breath sounds: Normal breath sounds.  Abdominal:     General: Bowel sounds are normal.     Palpations: Abdomen is soft.  Musculoskeletal:     Cervical back: Normal range of motion.  Lymphadenopathy:     Cervical: No cervical adenopathy.  Skin:    General: Skin is warm and dry.     Capillary Refill: Capillary refill takes less than 2 seconds.  Neurological:     Mental Status: She is alert and oriented to person, place, and time.  Psychiatric:        Behavior: Behavior normal.      Musculoskeletal Exam: C-spine, thoracic spine, and lumbar spine good ROM. Painful ROM of both shoulder joints with limited abduction to 120 degrees abduction and limited internal rotation bilaterally. Left elbow joint contracture.  Limited ROM of both wrist joints.  Synovitis and tenderness of the right wrist joint.  Synovitis of right 2nd and 3rd MCP joints. Ulnar deviation bilaterally. Hip joints, knee joints, ankle joints, MTPs, PIPs, and DIPs good ROM with no synovitis.  No warmth  or effusion of knee joints.  No tenderness or swelling of ankle joints.    CDAI Exam: CDAI Score: 11.4  Patient Global: 7 mm; Provider Global: 7 mm Swollen: 4 ; Tender: 6  Joint Exam 05/03/2019      Right  Left  Glenohumeral   Tender   Tender  Wrist  Swollen Tender     MCP 2  Swollen Tender  Swollen Tender  MCP 3  Swollen Tender        Investigation: No additional findings.  Imaging: No results found.  Recent Labs: Lab Results  Component Value Date   WBC 5.0 02/23/2019   HGB 15.6 (H) 02/23/2019   PLT 298 02/23/2019   NA 141 02/23/2019   K 4.7 02/23/2019   CL 104 02/23/2019   CO2 27 02/23/2019   GLUCOSE 89 02/23/2019   BUN 15 02/23/2019   CREATININE 0.83 02/23/2019   BILITOT 0.5 02/23/2019   ALKPHOS 87 01/15/2018   AST 14 02/23/2019   ALT 9 02/23/2019  PROT 6.9 02/23/2019   ALBUMIN 4.3 01/15/2018   CALCIUM 9.7 02/23/2019   GFRAA 89 02/23/2019   QFTBGOLDPLUS NEGATIVE 05/11/2018    Speciality Comments: No specialty comments available.  Procedures:  No procedures performed Allergies: Patient has no known allergies.   Assessment / Plan:     Visit Diagnoses: Rheumatoid arthritis involving multiple sites with positive rheumatoid factor ([place]): She has ongoing tenderness and synovitis of the right wrist joint and multiple MCP joints as described above. She has chronic pain in both hands, both wrist joints, and both shoulder joints.  She has incomplete fist formation bilaterally.  Ulnar deviation noted bilaterally.  Shoulder joints have painful and limited abduction to 120 degrees bilaterally.  She is on Otrexup 20 mg sq injections once weekly and has been taking folic acid 1 mg by mouth daily.  She was advised to increase the dose of folic acid to 2 mg daily.  We discussed that she will need combination therapy due to persistent high disease activity.  Indications, contraindications, potential side effects of Enbrel were discussed today.  All questions were addressed and  consent was obtained.  We will apply for Enbrel through her insurance.  She will start on Enbrel 50 mg subcutaneous injections once weekly once approved.  She will continue on Otrexup as prescribed.  A prescription for prednisone taper will be sent to the pharmacy today.  We will also place a referral for PT/OT.  She will follow-up in the office in 6 weeks.  Medication counseling:   TB Test: Negative on 05/11/2018.  Repeat TB Gold ordered today. Hepatitis panel: Negative on 05/11/2018 HIV: Negative on 01/15/2018 SPEP: IFE did not reveal any monoclonal proteins on 05/11/2018. Immunoglobulins: Immunoglobulins within normal limits on 05/11/2018.  Chest x-ray: Chest x-ray no evidence of acute abnormality on 02/08/2018.  Does patient have diagnosis of heart failure?  No  Counseled patient that Enbrel is a TNF blocking agent.  Reviewed Enbrel dose of 50 mg once weekly.  Counseled patient on purpose, proper use, and adverse effects of Enbrel.  Reviewed the most common adverse effects including infections, headache, and injection site reactions. Discussed that there is the possibility of an increased risk of malignancy but it is not well understood if this increased risk is due to the medication or the disease state.  Advised patient to get yearly dermatology exams due to risk of skin cancer.  Reviewed the importance of regular labs while on Enbrel therapy.  Advised patient to get standing labs one month after starting Enbrel then every 2 months.  Provided patient with standing lab orders.  Counseled patient that Enbrel should be held prior to scheduled surgery.  Counseled patient to avoid live vaccines while on Enbrel.  Advised patient to get annual influenza vaccine and the pneumococcal vaccine as needed.  Provided patient with medication education material and answered all questions.  Patient voiced understanding.  Patient consented to Enbrel.  Will upload consent into the media tab.  Reviewed storage instructions for  Enbrel.  Advised initial injection must be administered in office.  Patient voiced understanding.    High risk medication use - Applying for Enbrel 50 mg sq injections once weekly.  She will continue on Otrexup 20 mg sq once weekly and folic acid 2 mg po daily.  CBC, CMP, and TB gold were drawn today.  She will return for lab work in 1 month and every 3 months to monitor for drug toxicity.  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  No tenderness or synovitis was noted on exam today.  Status post total hip replacement, left - Performed by Dr. Mayer Camel in December 2019.  She has good range of motion with no discomfort at this time.  Other medical conditions are listed as follows:  Smoker  Anxiety and depression  Family history of breast cancer  Orders: Orders Placed This Encounter  Procedures  . QuantiFERON-TB Gold Plus  . CBC with Differential/Platelet  . COMPLETE METABOLIC PANEL WITH GFR   Meds ordered this encounter  Medications  . predniSONE (DELTASONE) 5 MG tablet    Sig: Take 4 tablets by mouth daily x2 days, 3 tablets by mouth daily x2 days, 2 tablets by mouth daily x2 days, 1 tablet by mouth daily x2 days.    Dispense:  20 tablet    Refill:  0    Face-to-face time spent with patient was 30 minutes. Greater than 50% of time was spent in counseling and coordination of care.  Follow-Up Instructions: Return in 6 weeks (on 06/14/2019) for Rheumatoid arthritis.   Ofilia Neas, PA-C   I examined and evaluated the patient with Hazel Sams PA.  Patient had synovitis on examination.  She is having difficulty making a fist.  She has severe end-stage rheumatoid arthritis.  We are detailed discussion regarding different treatment options and their side effects.  Indications adverse contraindications of Enbrel were discussed at length.  We will obtain  labs today and apply for Enbrel.  Her prednisone taper was also given as a bridging therapy.  The plan of care was discussed as noted above.  Bo Merino, MD  Note - This record has been created using Editor, commissioning.  Chart creation errors have been sought, but may not always  have been located. Such creation errors do not reflect on  the standard of medical care.

## 2019-05-03 ENCOUNTER — Other Ambulatory Visit: Payer: Self-pay

## 2019-05-03 ENCOUNTER — Telehealth: Payer: Self-pay | Admitting: Pharmacist

## 2019-05-03 ENCOUNTER — Ambulatory Visit (INDEPENDENT_AMBULATORY_CARE_PROVIDER_SITE_OTHER): Payer: Medicare Other | Admitting: Physician Assistant

## 2019-05-03 ENCOUNTER — Encounter: Payer: Self-pay | Admitting: Physician Assistant

## 2019-05-03 VITALS — BP 94/64 | HR 93 | Resp 17 | Ht 65.5 in | Wt 101.0 lb

## 2019-05-03 DIAGNOSIS — M0579 Rheumatoid arthritis with rheumatoid factor of multiple sites without organ or systems involvement: Secondary | ICD-10-CM

## 2019-05-03 DIAGNOSIS — Z803 Family history of malignant neoplasm of breast: Secondary | ICD-10-CM

## 2019-05-03 DIAGNOSIS — Z79899 Other long term (current) drug therapy: Secondary | ICD-10-CM | POA: Diagnosis not present

## 2019-05-03 DIAGNOSIS — Z96642 Presence of left artificial hip joint: Secondary | ICD-10-CM | POA: Diagnosis not present

## 2019-05-03 DIAGNOSIS — F17200 Nicotine dependence, unspecified, uncomplicated: Secondary | ICD-10-CM

## 2019-05-03 DIAGNOSIS — F419 Anxiety disorder, unspecified: Secondary | ICD-10-CM

## 2019-05-03 DIAGNOSIS — M24522 Contracture, left elbow: Secondary | ICD-10-CM | POA: Diagnosis not present

## 2019-05-03 DIAGNOSIS — F329 Major depressive disorder, single episode, unspecified: Secondary | ICD-10-CM

## 2019-05-03 MED ORDER — PREDNISONE 5 MG PO TABS: ORAL_TABLET | ORAL | 0 refills | Status: DC

## 2019-05-03 NOTE — Telephone Encounter (Signed)
Please start BIV for Enbrel for the treatment of rheumatoid arthritis.  She has tried and failed oral and injectable MTX. Patient dose will be Enbrel 50 mg every 7 days.      She has Medicare/Medicaid coverage.  She may need patient assistance.  She was given patient assistance application if needed in the future.  She has a household of 1 and yearly income is $14,000.   Mariella Saa, PharmD, [place], [place] Clinical Specialty Pharmacist [phone]  05/03/2019 4:09 PM

## 2019-05-03 NOTE — Progress Notes (Signed)
Pharmacy Note  Subjective: Patient presents today to the [facility] for follow up office visit.  Patient seen by the pharmacist for counseling on Enbrel for rheumatoid arthritis. She is on Otrexup with inadequate response.  Objective:  CBC    Component Value Date/Time   WBC 5.0 02/23/2019 1423   RBC 5.33 (H) 02/23/2019 1423   HGB 15.6 (H) 02/23/2019 1423   HGB 12.8 01/15/2018 1509   HCT 45.6 (H) 02/23/2019 1423   HCT 38.3 01/15/2018 1509   PLT 298 02/23/2019 1423   PLT 311 01/15/2018 1509   MCV 85.6 02/23/2019 1423   MCV 86 01/15/2018 1509   MCH 29.3 02/23/2019 1423   MCHC 34.2 02/23/2019 1423   RDW 13.9 02/23/2019 1423   RDW 14.2 01/15/2018 1509   LYMPHSABS 1,690 02/23/2019 1423   MONOABS 0.4 02/08/2018 1539   EOSABS 260 02/23/2019 1423   BASOSABS 40 02/23/2019 1423     CMP     Component Value Date/Time   NA 141 02/23/2019 1423   NA 142 01/15/2018 1509   K 4.7 02/23/2019 1423   CL 104 02/23/2019 1423   CO2 27 02/23/2019 1423   GLUCOSE 89 02/23/2019 1423   BUN 15 02/23/2019 1423   BUN 16 01/15/2018 1509   CREATININE 0.83 02/23/2019 1423   CALCIUM 9.7 02/23/2019 1423   PROT 6.9 02/23/2019 1423   PROT 6.8 01/15/2018 1509   ALBUMIN 4.3 01/15/2018 1509   AST 14 02/23/2019 1423   ALT 9 02/23/2019 1423   ALKPHOS 87 01/15/2018 1509   BILITOT 0.5 02/23/2019 1423   BILITOT 0.3 01/15/2018 1509   GFRNONAA 77 02/23/2019 1423   GFRAA 89 02/23/2019 1423     Baseline Immunosuppressant Therapy Labs TB GOLD  Updated TB pending 05/03/19 Quantiferon TB Gold Latest Ref Rng & Units 05/11/2018  Quantiferon TB Gold Plus NEGATIVE NEGATIVE   Hepatitis Panel Hepatitis Latest Ref Rng & Units 05/11/2018  Hep B Surface Ag NON-REACTI NON-REACTIVE  Hep B IgM NON-REACTI NON-REACTIVE  Hep C Ab NON-REACTI NON-REACTIVE  Hep C Ab NON-REACTI NON-REACTIVE   HIV Lab Results  Component Value Date   HIV Non Reactive 01/15/2018   Immunoglobulins Immunoglobulin Electrophoresis  Latest Ref Rng & Units 05/11/2018  IgA  47 - 310 mg/dL 231  IgG 600 - 1,640 mg/dL 786  IgM 50 - 300 mg/dL 178   SPEP Serum Protein Electrophoresis Latest Ref Rng & Units 02/23/2019  Total Protein 6.1 - 8.1 g/dL 6.9  Albumin 3.8 - 4.8 g/dL -  Alpha-1 0.2 - 0.3 g/dL -  Alpha-2 0.5 - 0.9 g/dL -  Beta Globulin 0.4 - 0.6 g/dL -  Beta 2 0.2 - 0.5 g/dL -  Gamma Globulin 0.8 - 1.7 g/dL -   G6PD No results found for: G6PDH TPMT No results found for: TPMT   Chest x-ray: Hyperinflation.  No evidence for acute  abnormality. On 02/09/2018   Does patient have diagnosis of heart failure?  No  Assessment/Plan:  Counseled patient that Enbrel is a TNF blocking agent. Counseled patient on purpose, proper use, and adverse effects of Enbrel.  Reviewed the most common adverse effects including infections, headache, and injection site reactions. Discussed that there is the possibility of an increased risk of malignancy but it is not well understood if this increased risk is due to the medication or the disease state.  Advised patient to get yearly dermatology exams due to risk of skin cancer.  Counseled patient that Enbrel should be held  prior to scheduled surgery.  Counseled patient to avoid live vaccines while on Enbrel. Recommend annual influenza, Pneumovax 23, Prevnar 13, and Shingrix as indicated.   Reviewed the importance of regular labs while on Enbrel therapy.  Advised patient to get standing labs one month after starting Enbrel then every 2 months.  Provided patient with standing lab orders.  Provided patient with medication education material and answered all questions.  Patient voiced understanding.  Patient consented to Enbrel.  Will upload consent into the media tab.  Reviewed storage instructions for Enbrel.  Advised initial injection must be administered in office.  Patient voiced understanding.    Patient dose will be Enbrel 50 mg every 7 days.  Prescription pending labs and/or insurance.  She  has Medicare/Medicaid coverage.  She may need patient assistance.  She was given patient assistance application if needed in the future.  She has a household of 1 and yearly income is $14,000.  All questions encouraged and answered. Instructed patient to call with any questions or concerns.  Mariella Saa, PharmD, [place], [place] Clinical Specialty Pharmacist [phone]  05/03/2019 4:07 PM

## 2019-05-03 NOTE — Telephone Encounter (Signed)
Submitted a Prior Authorization request to [employer] for ENBREL via Cover My Meds. Will update once we receive a response.  (KeyDierdre Highman) [id number]

## 2019-05-04 NOTE — Progress Notes (Signed)
CBC and CMP WNL

## 2019-05-04 NOTE — Telephone Encounter (Signed)
Received notification from [facility] regarding a prior authorization for ENBREL. Authorization has been APPROVED from 03/11/19 to 05/02/20.   Authorization # [id number]   Ran test claim, patient's copay is $4.00. She can fill through [facility].  8:48 AM Beatriz Chancellor, CPhT

## 2019-05-05 LAB — CBC WITH DIFFERENTIAL/PLATELET
Absolute Monocytes: 379 cells/uL (ref 200–950)
Basophils Absolute: 38 cells/uL (ref 0–200)
Basophils Relative: 0.8 %
Eosinophils Absolute: 182 cells/uL (ref 15–500)
Eosinophils Relative: 3.8 %
HCT: 43 % (ref 35.0–45.0)
Hemoglobin: 14.6 g/dL (ref 11.7–15.5)
Lymphs Abs: 1709 cells/uL (ref 850–3900)
MCH: 29.4 pg (ref 27.0–33.0)
MCHC: 34 g/dL (ref 32.0–36.0)
MCV: 86.5 fL (ref 80.0–100.0)
MPV: 10.5 fL (ref 7.5–12.5)
Monocytes Relative: 7.9 %
Neutro Abs: 2491 cells/uL (ref 1500–7800)
Neutrophils Relative %: 51.9 %
Platelets: 236 10*3/uL (ref 140–400)
RBC: 4.97 10*6/uL (ref 3.80–5.10)
RDW: 13.1 % (ref 11.0–15.0)
Total Lymphocyte: 35.6 %
WBC: 4.8 10*3/uL (ref 3.8–10.8)

## 2019-05-05 LAB — COMPLETE METABOLIC PANEL WITH GFR
AG Ratio: 1.7 (calc) (ref 1.0–2.5)
ALT: 8 U/L (ref 6–29)
AST: 11 U/L (ref 10–35)
Albumin: 3.9 g/dL (ref 3.6–5.1)
Alkaline phosphatase (APISO): 90 U/L (ref 37–153)
BUN: 15 mg/dL (ref 7–25)
CO2: 25 mmol/L (ref 20–32)
Calcium: 9.2 mg/dL (ref 8.6–10.4)
Chloride: 104 mmol/L (ref 98–110)
Creat: 0.96 mg/dL (ref 0.50–0.99)
GFR, Est African American: 75 mL/min/{1.73_m2} (ref >=60)
GFR, Est Non African American: 64 mL/min/{1.73_m2} (ref >=60)
Globulin: 2.3 g/dL (calc) (ref 1.9–3.7)
Glucose, Bld: 101 mg/dL — ABNORMAL HIGH (ref 65–99)
Potassium: 4.2 mmol/L (ref 3.5–5.3)
Sodium: 139 mmol/L (ref 135–146)
Total Bilirubin: 0.3 mg/dL (ref 0.2–1.2)
Total Protein: 6.2 g/dL (ref 6.1–8.1)

## 2019-05-05 LAB — QUANTIFERON-TB GOLD PLUS
Mitogen-NIL: >10 IU/mL
NIL: 0.03 IU/mL
QuantiFERON-TB Gold Plus: NEGATIVE
TB1-NIL: <0 IU/mL
TB2-NIL: <0 IU/mL

## 2019-05-05 IMAGING — CR DG CHEST 2V
2 series · 2 of 2 positions shown · non-contrast
Comparison: None.

CLINICAL DATA: Pre-op for left hip replacement. Denies any chest
complaints today. Smoker.

EXAM:
CHEST - 2 VIEW

[w chest pa]
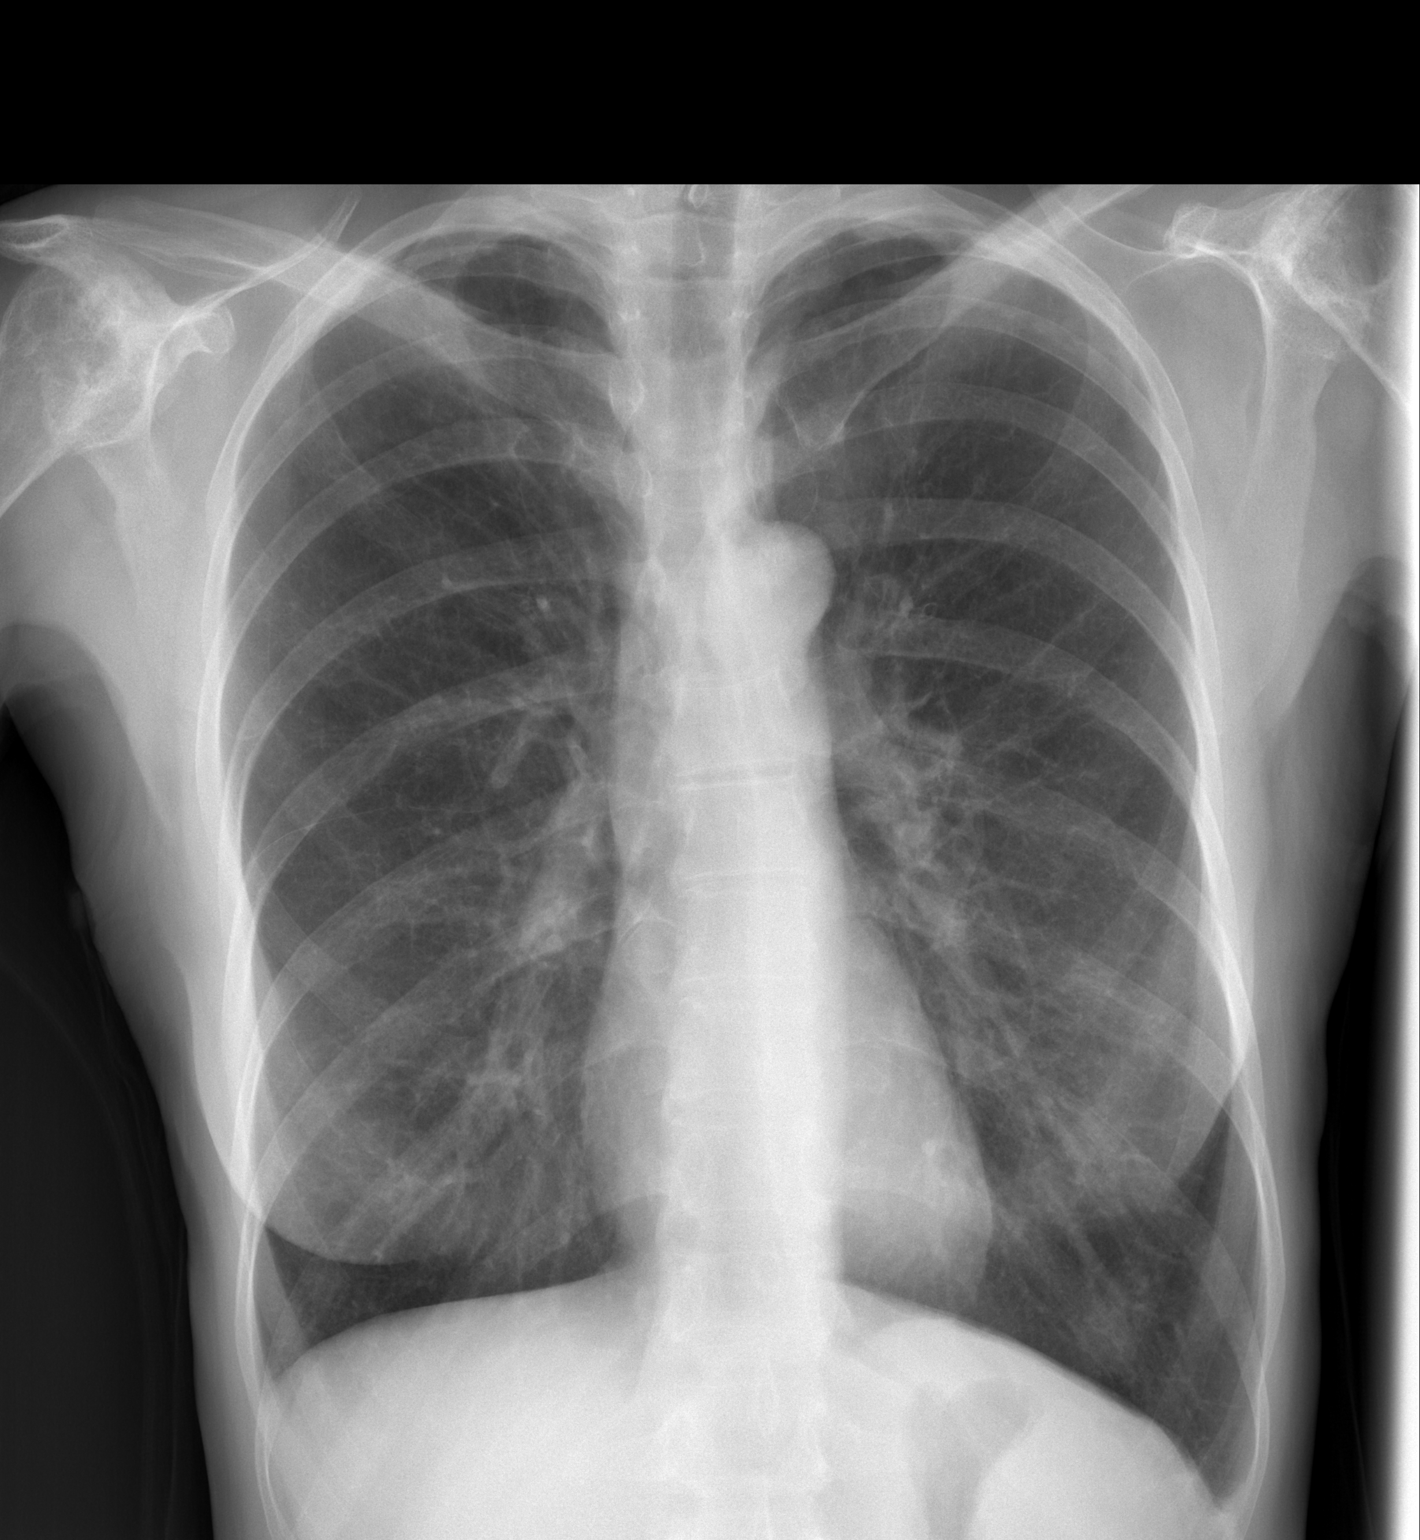

[w chest lat]
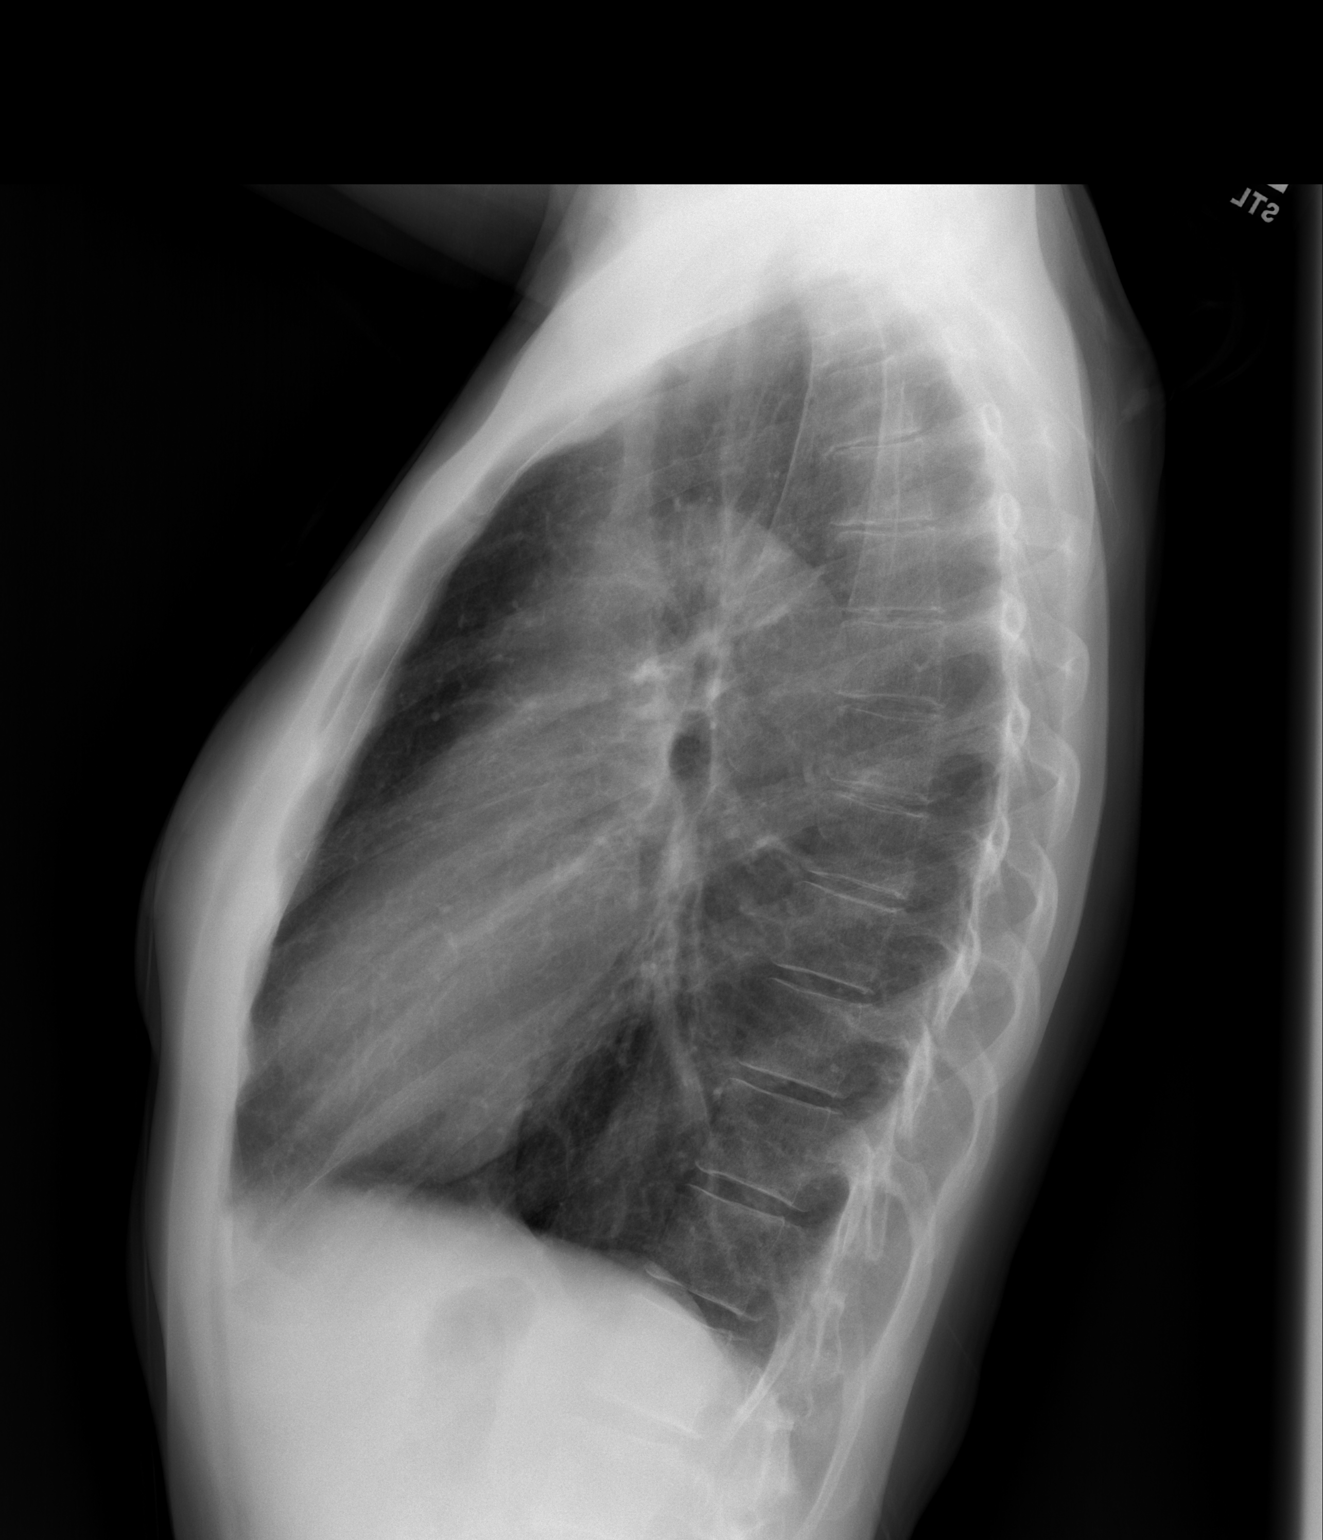

[2 of 2 positions shown; findings below may reference images not displayed]

FINDINGS: Lungs are hyperinflated. Heart size is normal. Lungs are clear. No
pulmonary edema. Degenerative changes are seen in both shoulders.
IMPRESSION: Hyperinflation.  No evidence for acute  abnormality.

## 2019-05-05 MED FILL — OTREXUP 20 MG/0.4 ML AUTO-I: 20 | 28 days supply | Qty: 2 | Fill #1

## 2019-05-05 NOTE — Telephone Encounter (Signed)
Called to notify patient of approval but no answer.  Left voicemail notifying that Enbrel was approved and we can go ahead and schedule her first dose visit as her co-pay will be $4.  Requested return call to schedule appointment for first dose.  Mariella Saa, PharmD, [place], [place] Clinical Specialty Pharmacist [phone]  05/05/2019 8:31 AM

## 2019-05-05 NOTE — Progress Notes (Signed)
TB gold negative

## 2019-05-05 NOTE — Telephone Encounter (Signed)
Patient returned call and scheduled for 3/1 at [place], PharmD, [place], [place] Clinical Specialty Pharmacist [phone]  05/05/2019 3:51 PM

## 2019-05-09 ENCOUNTER — Institutional Professional Consult (permissible substitution): Payer: Medicare Other

## 2019-05-10 ENCOUNTER — Telehealth: Payer: Self-pay | Admitting: *Deleted

## 2019-05-10 ENCOUNTER — Ambulatory Visit (INDEPENDENT_AMBULATORY_CARE_PROVIDER_SITE_OTHER): Payer: Medicare Other | Admitting: Pharmacist

## 2019-05-10 ENCOUNTER — Ambulatory Visit
Admission: RE | Admit: 2019-05-10 | Discharge: 2019-05-10 | Disposition: A | Discharge status: Home / Self Care | Payer: Medicare Other | Source: Ambulatory Visit | Attending: Internal Medicine | Admitting: Internal Medicine

## 2019-05-10 ENCOUNTER — Other Ambulatory Visit: Payer: Self-pay

## 2019-05-10 ENCOUNTER — Institutional Professional Consult (permissible substitution): Payer: Medicare Other

## 2019-05-10 VITALS — BP 100/63 | HR 61

## 2019-05-10 DIAGNOSIS — Z1239 Encounter for other screening for malignant neoplasm of breast: Secondary | ICD-10-CM

## 2019-05-10 DIAGNOSIS — M0579 Rheumatoid arthritis with rheumatoid factor of multiple sites without organ or systems involvement: Secondary | ICD-10-CM | POA: Diagnosis not present

## 2019-05-10 MED ORDER — ENBREL MINI 50 MG/ML ~~LOC~~ SOCT: 50.0000 mg | SUBCUTANEOUS | 0 refills | Status: DC

## 2019-05-10 NOTE — Telephone Encounter (Signed)
Attempted to contact patient to advised she needs a 1 month follow up and labs.

## 2019-05-10 NOTE — Progress Notes (Signed)
Pharmacy Note  Subjective:   Patient is being initiated on Enbrel.  Patient was previously counseled extensively on and consented to initiation of Enbrel at that time.  Patient presents to clinic today to receive the first dose of Enbrel Mini.     Objective: CMP     Component Value Date/Time   NA 139 05/03/2019 1557   NA 142 01/15/2018 1509   K 4.2 05/03/2019 1557   CL 104 05/03/2019 1557   CO2 25 05/03/2019 1557   GLUCOSE 101 (H) 05/03/2019 1557   BUN 15 05/03/2019 1557   BUN 16 01/15/2018 1509   CREATININE 0.96 05/03/2019 1557   CALCIUM 9.2 05/03/2019 1557   PROT 6.2 05/03/2019 1557   PROT 6.8 01/15/2018 1509   ALBUMIN 4.3 01/15/2018 1509   AST 11 05/03/2019 1557   ALT 8 05/03/2019 1557   ALKPHOS 87 01/15/2018 1509   BILITOT 0.3 05/03/2019 1557   BILITOT 0.3 01/15/2018 1509   GFRNONAA 64 05/03/2019 1557   GFRAA 75 05/03/2019 1557    CBC    Component Value Date/Time   WBC 4.8 05/03/2019 1557   RBC 4.97 05/03/2019 1557   HGB 14.6 05/03/2019 1557   HGB 12.8 01/15/2018 1509   HCT 43.0 05/03/2019 1557   HCT 38.3 01/15/2018 1509   PLT 236 05/03/2019 1557   PLT 311 01/15/2018 1509   MCV 86.5 05/03/2019 1557   MCV 86 01/15/2018 1509   MCH 29.4 05/03/2019 1557   MCHC 34.0 05/03/2019 1557   RDW 13.1 05/03/2019 1557   RDW 14.2 01/15/2018 1509   LYMPHSABS 1,709 05/03/2019 1557   MONOABS 0.4 02/08/2018 1539   EOSABS 182 05/03/2019 1557   BASOSABS 38 05/03/2019 1557    Baseline Immunosuppressant Therapy Labs TB GOLD Quantiferon TB Gold Latest Ref Rng & Units 05/03/2019  Quantiferon TB Gold Plus NEGATIVE NEGATIVE   Hepatitis Panel Hepatitis Latest Ref Rng & Units 05/11/2018  Hep B Surface Ag NON-REACTI NON-REACTIVE  Hep B IgM NON-REACTI NON-REACTIVE  Hep C Ab NON-REACTI NON-REACTIVE  Hep C Ab NON-REACTI NON-REACTIVE   HIV Lab Results  Component Value Date   HIV Non Reactive 01/15/2018   Immunoglobulins Immunoglobulin Electrophoresis Latest Ref Rng & Units  05/11/2018  IgA  47 - 310 mg/dL 231  IgG 600 - 1,640 mg/dL 786  IgM 50 - 300 mg/dL 178   SPEP Serum Protein Electrophoresis Latest Ref Rng & Units 05/03/2019  Total Protein 6.1 - 8.1 g/dL 6.2  Albumin 3.8 - 4.8 g/dL -  Alpha-1 0.2 - 0.3 g/dL -  Alpha-2 0.5 - 0.9 g/dL -  Beta Globulin 0.4 - 0.6 g/dL -  Beta 2 0.2 - 0.5 g/dL -  Gamma Globulin 0.8 - 1.7 g/dL -   G6PD No results found for: G6PDH TPMT No results found for: TPMT   Chest x-ray: Hyperinflation.  No evidence for acute  abnormality on 02/08/2018  Patient running a fever or have signs/symptoms of infection? No  Assessment/Plan:  Demonstrated proper injection technique with Enbrel Mini demo.  Patient able to demonstrate proper injection technique using the teach back method.  Patient self injected in the left lower thigh with:  Sample Medication: Enbrel Mini NDC: [id number] Lot: [id number] Expiration: 08/22  Patient tolerated well.  Observed for 30 mins in office for adverse reaction and none noted.   Patient is to return in 1 month for follow up appointment and  labs.  Standing orders placed. Prescription sent to [facility].  She does  not need co-pay assistance as her co-pay should be $4.00 per month.   All questions encouraged and answered.  Instructed patient to call with any further questions or concerns.   Mariella Saa, PharmD, [place], [place] Clinical Specialty Pharmacist [phone]  05/10/2019 11:29 AM

## 2019-05-10 NOTE — Telephone Encounter (Signed)
-----   Message from [employer], [place] sent at 05/10/2019 11:34 AM EST ----- Pateint left without scheduling 1 month follow-up/labs.  Can you please call and schedule at your convenience?  Thanks!Museum/gallery conservator

## 2019-05-11 MED FILL — ENBREL MINI 50 MG/ML SOCT: 50 | 28 days supply | Qty: 4 | Fill #0

## 2019-05-13 ENCOUNTER — Other Ambulatory Visit: Payer: Self-pay | Admitting: Internal Medicine

## 2019-05-13 DIAGNOSIS — R928 Other abnormal and inconclusive findings on diagnostic imaging of breast: Secondary | ICD-10-CM

## 2019-05-17 ENCOUNTER — Ambulatory Visit: Payer: Medicare Other | Admitting: Physical Therapy

## 2019-05-20 MED FILL — OTREXUP 20 MG/0.4 ML AUTO-I: 20 | 28 days supply | Qty: 2 | Fill #1

## 2019-05-25 ENCOUNTER — Telehealth: Payer: Self-pay | Admitting: Pharmacy Technician

## 2019-05-25 NOTE — Telephone Encounter (Signed)
Received message from [facility] that patient's Enbrel was not picked up by patient and has been returned to stock. They return to stock after 7 days. Called patient, left message.  9:30 AM Beatriz Chancellor, CPhT

## 2019-05-27 ENCOUNTER — Ambulatory Visit
Admission: RE | Admit: 2019-05-27 | Discharge: 2019-05-27 | Disposition: A | Discharge status: Home / Self Care | Payer: Medicare Other | Source: Ambulatory Visit | Attending: Internal Medicine | Admitting: Internal Medicine

## 2019-05-27 ENCOUNTER — Telehealth: Payer: Self-pay | Admitting: Internal Medicine

## 2019-05-27 ENCOUNTER — Telehealth: Payer: Self-pay | Admitting: Rheumatology

## 2019-05-27 ENCOUNTER — Other Ambulatory Visit: Payer: Self-pay | Admitting: Internal Medicine

## 2019-05-27 ENCOUNTER — Other Ambulatory Visit: Payer: Self-pay

## 2019-05-27 DIAGNOSIS — R928 Other abnormal and inconclusive findings on diagnostic imaging of breast: Secondary | ICD-10-CM

## 2019-05-27 MED ORDER — DIAZEPAM 2 MG PO TABS: ORAL_TABLET | ORAL | 0 refills | Status: DC

## 2019-05-27 MED FILL — ENBREL MINI 50 MG/ML SOCT: 50 | 28 days supply | Qty: 4 | Fill #0

## 2019-05-27 NOTE — Telephone Encounter (Signed)
Patient called stating she received your message and will be going to [place] today to pick up her Enbrel.  Patient states "she hasn't been feeling well" and that is the reason she hasn't picked up the prescription yet.

## 2019-05-27 NOTE — Telephone Encounter (Signed)
Patient already contacted pharmacy and medication was filled today.

## 2019-05-27 NOTE — Telephone Encounter (Signed)
Pt called and she is having to have a procedure done on the 26 and would like valium to take

## 2019-05-27 NOTE — Telephone Encounter (Signed)
Contacted pt to see what procedure she is having done. Pt states she is having a biopsy done on her breast. Pt states the procedure is schedule for March 26th at the breast center. Pt would like rx [facility]

## 2019-05-27 NOTE — Telephone Encounter (Signed)
Reached out to patient but no answer.  Left voicemail requesting return call.   Mariella Saa, PharmD, [place], [place] Clinical Specialty Pharmacist [phone]  05/27/2019 11:45 AM

## 2019-06-09 NOTE — Progress Notes (Deleted)
Office Visit Note  Patient: [name]             Date of Birth: Aug 25, 1960           MRN: [id number]             PCP: Ladell Pier, MD Referring: Ladell Pier, MD Visit Date: 06/14/2019 Occupation: @GUAROCC @  Subjective:  No chief complaint on file.   History of Present Illness: [name] is a 59 y.o. female ***   Activities of Daily Living:  Patient reports morning stiffness for *** {minute/hour:19697}.   Patient {ACTIONS;DENIES/REPORTS:21021675::"Denies"} nocturnal pain.  Difficulty dressing/grooming: {ACTIONS;DENIES/REPORTS:21021675::"Denies"} Difficulty climbing stairs: {ACTIONS;DENIES/REPORTS:21021675::"Denies"} Difficulty getting out of chair: {ACTIONS;DENIES/REPORTS:21021675::"Denies"} Difficulty using hands for taps, buttons, cutlery, and/or writing: {ACTIONS;DENIES/REPORTS:21021675::"Denies"}  No Rheumatology ROS completed.   PMFS History:  Patient Active Problem List   Diagnosis Date Noted  . Adenomatous colon polyp 03/16/2019  . Degenerative joint disease of shoulder region 06/08/2018  . Rheumatoid arthritis involving both hands with positive rheumatoid factor ([place]) 06/08/2018  . Rheumatoid arthritis involving both feet with positive rheumatoid factor ([place]) 06/08/2018  . 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 Status post total hip replacement, 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  . Smoker 06/08/2018  . Anxiety and depression 06/08/2018  . Family history of breast cancer 03/25/2018  . 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 Rheumatoid arthritis involving multiple sites ([place]) 01/15/2018  . Tobacco dependence 01/15/2018  . Opioid use agreement exists 01/15/2018  . Positive depression screening 01/15/2018  . Homeless 01/15/2018    Past Medical History:  Diagnosis Date  . Anxiety   . Bronchitis age 67  . Depression   . Hx of bladder infections    been over 5 years since last one   . Rheumatoid arthritis ([place])    unc rheum   . Tobacco abuse   . Trouble in  sleeping     Family History  Problem Relation Age of Onset  . Breast cancer Mother   . Cervical cancer Mother   . Cancer Brother   . Healthy Son    Past Surgical History:  Procedure Laterality Date  . COLONOSCOPY WITH PROPOFOL N/A 03/15/2019   Procedure: COLONOSCOPY WITH PROPOFOL;  Surgeon: Jonathon Bellows, MD;  Location: [facility] ENDOSCOPY;  Service: Gastroenterology;  Laterality: N/A;  . JOINT REPLACEMENT    . TOTAL HIP ARTHROPLASTY Left 02/15/2018   Procedure: TOTAL HIP ARTHROPLASTY ANTERIOR APPROACH;  Surgeon: [name], MD;  Location: [facility] ORS;  Service: Orthopedics;  Laterality: Left;  . WISDOM TOOTH EXTRACTION     Social History   Social History Narrative  . Not on file   Immunization History  Administered Date(s) Administered  . Pneumococcal Conjugate-13 07/14/2018  . Tdap 03/25/2018  . Zoster Recombinat (Shingrix) 07/14/2018     Objective: Vital Signs: There were no vitals taken for this visit.   Physical Exam   Musculoskeletal Exam: ***  CDAI Exam: CDAI Score: - Patient Global: -; Provider Global: - Swollen: -; Tender: - Joint Exam 06/14/2019   No joint exam has been documented for this visit   There is currently no information documented on the homunculus. Go to the Rheumatology activity and complete the homunculus joint exam.  Investigation: No additional findings.  Imaging: MM Digital Diagnostic Unilat L  Result Date: 05/27/2019 CLINICAL DATA:  60 year old female presenting as a recall from screening for left breast calcifications. EXAM: DIGITAL DIAGNOSTIC LEFT MAMMOGRAM COMPARISON:  Previous exam(s). ACR Breast Density Category c: The breast tissue is heterogeneously dense,  which may obscure small masses. FINDINGS: Additional spot magnification and full field mL views performed of the left breast for the questioned calcifications in the upper central breast. These could not be magnified on the CC view due to far posterior location. There are regional scattered  calcifications with a central larger group of pleomorphic calcifications spanning approximately 0.9 cm. IMPRESSION: Left breast calcifications in the superior central breast are indeterminate. Tissue sampling is recommended. RECOMMENDATION: Stereotactic core needle biopsy of the largest group of calcifications in the left breast. If pathology is benign, recommend six-month follow-up of the left breast for evaluation of the additional regional scattered calcifications. If atypia or malignancy, consider MRI and/or additional stereotactic biopsy of calcifications in the upper left breast to determine extent of disease. I have discussed the findings and recommendations with the patient. If applicable, a reminder letter will be sent to the patient regarding the next appointment. BI-RADS CATEGORY  4: Suspicious. Electronically Signed   By: Audie Pinto M.D.   On: 05/27/2019 15:53    Recent Labs: Lab Results  Component Value Date   WBC 4.8 05/03/2019   HGB 14.6 05/03/2019   PLT 236 05/03/2019   NA 139 05/03/2019   K 4.2 05/03/2019   CL 104 05/03/2019   CO2 25 05/03/2019   GLUCOSE 101 (H) 05/03/2019   BUN 15 05/03/2019   CREATININE 0.96 05/03/2019   BILITOT 0.3 05/03/2019   ALKPHOS 87 01/15/2018   AST 11 05/03/2019   ALT 8 05/03/2019   PROT 6.2 05/03/2019   ALBUMIN 4.3 01/15/2018   CALCIUM 9.2 05/03/2019   GFRAA 75 05/03/2019   QFTBGOLDPLUS NEGATIVE 05/03/2019    Speciality Comments: No specialty comments available.  Procedures:  No procedures performed Allergies: Patient has no known allergies.   Assessment / Plan:     Visit Diagnoses: No diagnosis found.  Orders: No orders of the defined types were placed in this encounter.  No orders of the defined types were placed in this encounter.   Face-to-face time spent with patient was *** minutes. Greater than 50% of time was spent in counseling and coordination of care.  Follow-Up Instructions: No follow-ups on file.   Ofilia Neas, PA-C  Note - This record has been created using Dragon software.  Chart creation errors have been sought, but may not always  have been located. Such creation errors do not reflect on  the standard of medical care.

## 2019-06-13 ENCOUNTER — Ambulatory Visit
Admission: RE | Admit: 2019-06-13 | Discharge: 2019-06-13 | Disposition: A | Discharge status: Home / Self Care | Payer: Medicare Other | Source: Ambulatory Visit | Attending: Internal Medicine | Admitting: Internal Medicine

## 2019-06-13 ENCOUNTER — Other Ambulatory Visit: Payer: Self-pay

## 2019-06-13 ENCOUNTER — Other Ambulatory Visit (HOSPITAL_COMMUNITY): Payer: Self-pay | Admitting: Diagnostic Radiology

## 2019-06-13 DIAGNOSIS — R928 Other abnormal and inconclusive findings on diagnostic imaging of breast: Secondary | ICD-10-CM

## 2019-06-14 ENCOUNTER — Ambulatory Visit: Payer: Medicare Other | Admitting: Physician Assistant

## 2019-06-15 ENCOUNTER — Encounter: Payer: Self-pay | Admitting: Internal Medicine

## 2019-06-15 ENCOUNTER — Telehealth: Payer: Self-pay | Admitting: Rheumatology

## 2019-06-15 DIAGNOSIS — D0512 Intraductal carcinoma in situ of left breast: Secondary | ICD-10-CM | POA: Insufficient documentation

## 2019-06-15 NOTE — Telephone Encounter (Signed)
She needs to call Enbrel support or schedule an appointment.  She was a no show for her 1 month follow up appointment.   Mariella Saa, PharmD, [place], [place] Clinical Specialty Pharmacist [phone]  06/15/2019 2:56 PM

## 2019-06-15 NOTE — Telephone Encounter (Signed)
Patient went to give herself the Enbrel injection today, and the green light never went off. Please call patient to advise.

## 2019-06-15 NOTE — Telephone Encounter (Signed)
I returned patient's call and discussed that in case she needs radiation therapy she will have to discontinue methotrexate.  She has an appointment coming up after appointment with her surgeon.  We will discuss future plans at that visit.

## 2019-06-15 NOTE — Telephone Encounter (Signed)
Spoke with patient and she will call Enbrel Support. Patient has a follow up visit scheduled for 06/27/19. Patient states she was just diagnosed with breast cancer. Patient states she will be seeing the surgeon on 06/21/19.

## 2019-06-17 NOTE — Progress Notes (Deleted)
Office Visit Note  Patient: [name]             Date of Birth: [birth year]           MRN: [id number]             PCP: Ladell Pier, MD Referring: Ladell Pier, MD Visit Date: 06/27/2019 Occupation: @GUAROCC @  Subjective:  No chief complaint on file.   History of Present Illness: [name] is a 59 y.o. female ***   Activities of Daily Living:  Patient reports morning stiffness for *** {minute/hour:19697}.   Patient {ACTIONS;DENIES/REPORTS:21021675::"Denies"} nocturnal pain.  Difficulty dressing/grooming: {ACTIONS;DENIES/REPORTS:21021675::"Denies"} Difficulty climbing stairs: {ACTIONS;DENIES/REPORTS:21021675::"Denies"} Difficulty getting out of chair: {ACTIONS;DENIES/REPORTS:21021675::"Denies"} Difficulty using hands for taps, buttons, cutlery, and/or writing: {ACTIONS;DENIES/REPORTS:21021675::"Denies"}  No Rheumatology ROS completed.   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 ([place]) 06/08/2018   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   Status post total hip replacement, 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   Smoker 06/08/2018   Anxiety and depression 06/08/2018   Family history of breast cancer 03/25/2018   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   Rheumatoid arthritis involving multiple sites ([place]) 01/15/2018   Tobacco dependence 01/15/2018   Opioid use agreement exists 01/15/2018   Positive depression screening 01/15/2018   Homeless 01/15/2018    Past Medical History:  Diagnosis Date   Anxiety    Bronchitis age 55   Depression    Hx of bladder infections    been over 5 years since last one    Rheumatoid  arthritis ([place])    unc rheum    Tobacco abuse    Trouble in sleeping     Family History  Problem Relation Age of Onset   Breast cancer Mother    Cervical cancer Mother    Cancer Brother    Healthy Son    Past Surgical History:  Procedure Laterality Date   COLONOSCOPY WITH PROPOFOL N/A 03/15/2019   Procedure: COLONOSCOPY WITH PROPOFOL;  Surgeon: Jonathon Bellows, MD;  [facility] ENDOSCOPY;  Service: Gastroenterology;  Laterality: N/A;   JOINT REPLACEMENT     TOTAL HIP ARTHROPLASTY Left 02/15/2018   Procedure: TOTAL HIP ARTHROPLASTY ANTERIOR APPROACH;  Surgeon: [name], MD;  Location: [facility] ORS;  Service: Orthopedics;  Laterality: Left;   WISDOM TOOTH EXTRACTION     Social History   Social History Narrative   Not on file   Immunization History  Administered Date(s) Administered   Pneumococcal Conjugate-13 07/14/2018   Tdap 03/25/2018   Zoster Recombinat (Shingrix) 07/14/2018     Objective: Vital Signs: There were no vitals taken for this visit.   Physical Exam   Musculoskeletal Exam: ***  CDAI Exam: CDAI Score: -- Patient Global: --; Provider Global: -- Swollen: --; Tender: -- Joint Exam 06/27/2019   No joint exam has been documented for this visit   There is currently no information documented on the homunculus. Go to the Rheumatology activity and complete the homunculus joint exam.  Investigation: No additional findings.  Imaging: MM Digital Diagnostic Unilat L  Result Date: 05/27/2019 CLINICAL DATA:  59 year old female presenting as a recall from screening for left breast calcifications. EXAM: DIGITAL DIAGNOSTIC LEFT MAMMOGRAM COMPARISON:  Previous exam(s).  ACR Breast Density Category c: The breast tissue is heterogeneously dense, which may obscure small masses. FINDINGS: Additional spot magnification and full field mL views performed of the left breast for the questioned calcifications in the upper central breast. These could not be magnified on  the CC view due to far posterior location. There are regional scattered calcifications with a central larger group of pleomorphic calcifications spanning approximately 0.9 cm. IMPRESSION: Left breast calcifications in the superior central breast are indeterminate. Tissue sampling is recommended. RECOMMENDATION: Stereotactic core needle biopsy of the largest group of calcifications in the left breast. If pathology is benign, recommend six-month follow-up of the left breast for evaluation of the additional regional scattered calcifications. If atypia or malignancy, consider MRI and/or additional stereotactic biopsy of calcifications in the upper left breast to determine extent of disease. I have discussed the findings and recommendations with the patient. If applicable, a reminder letter will be sent to the patient regarding the next appointment. BI-RADS CATEGORY  4: Suspicious. Electronically Signed   By: Audie Pinto M.D.   On: 05/27/2019 15:53   MM CLIP PLACEMENT LEFT  Result Date: 06/13/2019 CLINICAL DATA:  Status post stereotactic core needle biopsy of left breast calcifications. Evaluate biopsy marker clip placement. EXAM: DIAGNOSTIC LEFT MAMMOGRAM POST STEREOTACTIC BIOPSY COMPARISON:  Previous exam(s). FINDINGS: Mammographic images were obtained following stereotactic guided biopsy of left breast calcifications. The biopsy marking clip is in expected position at the site of biopsy. IMPRESSION: Appropriate positioning of the coil shaped biopsy marking clip at the site of biopsy in the upper outer quadrant, close to 12 o'clock, adjacent to residual calcifications. Final Assessment: Post Procedure Mammograms for Marker Placement Electronically Signed   By: Lajean Manes M.D.   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 This was found to be concordant by Dr. Lajean Manes. Pathology results were discussed with the patient by telephone. The patient reported doing well after the biopsy with tenderness at the site. Post biopsy instructions and care were reviewed and questions were answered. The patient was encouraged to call [facility] for any additional concerns. Surgical consultation has been arranged with Dr. Autumn Messing at [facility] on June 21, 2019. Consider MRI and/or additional stereotactic biopsy of calcifications in the upper LEFT breast to determine extent of disease. Pathology results reported by Stacie Acres RN on 06/15/2019. Electronically Signed   By: Lajean Manes M.D.   On: 06/15/2019 10:56   Result Date: 06/15/2019 CLINICAL DATA:  Patient presents for stereotactic core needle biopsy of left breast calcifications. EXAM: LEFT BREAST STEREOTACTIC CORE NEEDLE BIOPSY COMPARISON:  Previous exams. FINDINGS: The patient and I discussed the procedure of stereotactic-guided biopsy including benefits and alternatives. We discussed the high likelihood of a successful procedure. We discussed the risks of the procedure including infection, bleeding, tissue injury, clip migration, and inadequate sampling. Informed written consent was given. The usual time out protocol was performed immediately prior to the procedure. Using sterile technique and 1% Lidocaine as local anesthetic, under stereotactic guidance, a 9 gauge vacuum assisted device was used to perform core needle biopsy of calcifications in the upper outer quadrant of the left breast using a lateral approach. Specimen radiograph was performed showing calcifications for which biopsy  was performed. Specimens with calcifications are identified for pathology. Lesion quadrant: Upper outer quadrant At the conclusion of the procedure, coil shaped tissue marker clip was deployed into the  biopsy cavity. Follow-up 2-view mammogram was performed and dictated separately. IMPRESSION: Stereotactic-guided biopsy of left breast calcifications. No apparent complications. Electronically Signed: By: Lajean Manes M.D. On: 06/13/2019 12:41    Recent Labs: Lab Results  Component Value Date   WBC 4.8 05/03/2019   HGB 14.6 05/03/2019   PLT 236 05/03/2019   NA 139 05/03/2019   K 4.2 05/03/2019   CL 104 05/03/2019   CO2 25 05/03/2019   GLUCOSE 101 (H) 05/03/2019   BUN 15 05/03/2019   CREATININE 0.96 05/03/2019   BILITOT 0.3 05/03/2019   ALKPHOS 87 01/15/2018   AST 11 05/03/2019   ALT 8 05/03/2019   PROT 6.2 05/03/2019   ALBUMIN 4.3 01/15/2018   CALCIUM 9.2 05/03/2019   GFRAA 75 05/03/2019   QFTBGOLDPLUS NEGATIVE 05/03/2019    Speciality Comments: No specialty comments available.  Procedures:  No procedures performed Allergies: Patient has no known allergies.   Assessment / Plan:     Visit Diagnoses: No diagnosis found.  Orders: No orders of the defined types were placed in this encounter.  No orders of the defined types were placed in this encounter.   Face-to-face time spent with patient was *** minutes. Greater than 50% of time was spent in counseling and coordination of care.  Follow-Up Instructions: No follow-ups on file.   Ofilia Neas, PA-C  Note - This record has been created using Dragon software.  Chart creation errors have been sought, but may not always  have been located. Such creation errors do not reflect on  the standard of medical care.

## 2019-06-21 ENCOUNTER — Telehealth: Payer: Self-pay | Admitting: Rheumatology

## 2019-06-21 ENCOUNTER — Ambulatory Visit: Payer: Self-pay | Admitting: General Surgery

## 2019-06-21 DIAGNOSIS — D0512 Intraductal carcinoma in situ of left breast: Secondary | ICD-10-CM

## 2019-06-21 NOTE — Telephone Encounter (Signed)
Sharyn Lull from [employer] left voicemail stating the patient reported to the manufacturer that they had one Enbrel mini cartridge that was damaged.  The manufacturer will replace it free of charge.  Sharyn Lull stated they are the shipping pharmacy for the manufacturer and need a prescription to dispense.  Please call with verbal to [phone]  Case # [id number]

## 2019-06-22 MED FILL — ENBREL MINI 50 MG/ML SOCT: 50 | 28 days supply | Qty: 4 | Fill #1

## 2019-06-22 NOTE — Telephone Encounter (Signed)
[facility] and gave verbal order for replacement to pharmacist Alex.  Nothing further needed.   Mariella Saa, PharmD, [place], [place] Clinical Specialty Pharmacist [phone]  06/22/2019 11:09 AM

## 2019-06-23 ENCOUNTER — Other Ambulatory Visit: Payer: Self-pay | Admitting: General Surgery

## 2019-06-23 ENCOUNTER — Other Ambulatory Visit: Payer: Medicare Other

## 2019-06-23 DIAGNOSIS — D0512 Intraductal carcinoma in situ of left breast: Secondary | ICD-10-CM

## 2019-06-24 ENCOUNTER — Ambulatory Visit
Admission: RE | Admit: 2019-06-24 | Discharge: 2019-06-24 | Disposition: A | Discharge status: Home / Self Care | Payer: Medicare Other | Source: Ambulatory Visit | Attending: General Surgery | Admitting: General Surgery

## 2019-06-24 ENCOUNTER — Other Ambulatory Visit: Payer: Self-pay

## 2019-06-24 DIAGNOSIS — D0512 Intraductal carcinoma in situ of left breast: Secondary | ICD-10-CM

## 2019-06-24 MED ORDER — GADOBUTROL 1 MMOL/ML IV SOLN
5.0000 mL | Freq: Once | INTRAVENOUS | Status: AC | PRN
  Administered 2019-06-24: 5 mL via INTRAVENOUS

## 2019-06-27 ENCOUNTER — Ambulatory Visit: Payer: Medicare Other | Admitting: Physician Assistant

## 2019-06-27 ENCOUNTER — Ambulatory Visit: Payer: Medicare Other | Attending: Internal Medicine

## 2019-06-27 DIAGNOSIS — Z23 Encounter for immunization: Secondary | ICD-10-CM

## 2019-06-27 NOTE — Progress Notes (Signed)
   Covid-19 Vaccination Clinic  Name:  [name]    MRN: [id number] DOB: 09/12/1960  06/27/2019  [name] was observed post Covid-19 immunization for 15 minutes without incident. She was provided with Vaccine Information Sheet and instruction to access the V-Safe system.   [name] was instructed to call 911 with any severe reactions post vaccine: Marland Kitchen Difficulty breathing  . Swelling of face and throat  . A fast heartbeat  . A bad rash all over body  . Dizziness and weakness   Immunizations Administered    Name Date Dose VIS Date Route   Pfizer COVID-19 Vaccine 06/27/2019 12:35 PM 0.3 mL 05/04/2018 Intramuscular   Manufacturer: [place]   Lot: [id number]   [place]: [id number]

## 2019-06-29 ENCOUNTER — Other Ambulatory Visit: Payer: Self-pay | Admitting: General Surgery

## 2019-06-29 DIAGNOSIS — R9389 Abnormal findings on diagnostic imaging of other specified body structures: Secondary | ICD-10-CM

## 2019-06-30 ENCOUNTER — Telehealth: Payer: Self-pay | Admitting: Oncology

## 2019-06-30 NOTE — Telephone Encounter (Signed)
Received a new pt referral from Dr. [name] for DCIS. [name] returned my call to schedule a new pt appt for dx of breast cancer. [name] has been scheduled to see Dr. [name] on 4/29 at 4pm w/labs at 330pm.

## 2019-07-05 NOTE — Progress Notes (Signed)
Location of Breast Cancer: Ductal carcinoma in situ of LEFT breast  Histology per Pathology Report:  06/13/2019 Diagnosis Breast, left, needle core biopsy, upper outer quadrant - DUCTAL CARCINOMA IN SITU WITH CALCIFICATIONS. 07/07/2019 MRI guided biopsy today--results pending  Receptor Status: ER(100%), PR (60%),   Did patient present with symptoms (if so, please note symptoms) or was this found on screening mammography?:  Screening mammogram.  Past/Anticipated interventions by surgeon, if any: TBD: initial biopsy done by Dr. [name] on 06/13/2019  Past/Anticipated interventions by medical oncology, if any:  Scheduled to see Dr. Lurline Del later today  Lymphedema issues, if any:  None    Pain issues, if any:  None to breast, but considerable pain to both shoulders and hands.    SAFETY ISSUES:  Prior radiation? No  Pacemaker/ICD? No  Possible current pregnancy? No  Is the patient on methotrexate? Yes--took last injection yesterday 07/06/2019  Current Complaints / other details:   Had MRI and biopsy of breast this morning. Otherwise nothing of note.    Zola Button, RN 07/05/2019,2:15 PM

## 2019-07-06 ENCOUNTER — Other Ambulatory Visit: Payer: Self-pay

## 2019-07-06 ENCOUNTER — Other Ambulatory Visit: Payer: Self-pay | Admitting: Internal Medicine

## 2019-07-06 DIAGNOSIS — C50919 Malignant neoplasm of unspecified site of unspecified female breast: Secondary | ICD-10-CM

## 2019-07-06 DIAGNOSIS — F17200 Nicotine dependence, unspecified, uncomplicated: Secondary | ICD-10-CM

## 2019-07-06 NOTE — Progress Notes (Signed)
[name]  [phone] [phone]) [phone]     ID: [name] DOB: [birth year]  MR#: [id number]  NVB#:[id number]  Patient Care Team: Ladell Pier, MD as PCP - General (Internal Medicine) Marytza Grandpre, Virgie Dad, MD as Consulting Physician (Oncology) [name], MD as Consulting Physician (General Surgery) Chauncey Cruel, MD OTHER MD:  CHIEF COMPLAINT: estrogen receptor positive noninvasive breast cancer  CURRENT TREATMENT: Awaiting definitive surgery   HISTORY OF CURRENT ILLNESS: [name] had routine screening mammography on 05/10/2019 showing a possible abnormality in the left breast. She underwent left diagnostic mammography with tomography at [facility] on 05/27/2019 showing: breast density category C; indeterminate 0.9 cm left breast calcifications.  There was no axillary ultrasonography.  Accordingly on 06/13/2019 she proceeded to biopsy of the left breast area in question. The pathology from this procedure ([id number]) showed: ductal carcinoma in situ, intermediate grade. Prognostic indicators significant for: estrogen receptor, 100% positive and progesterone receptor, 60% positive, both with strong staining intensity.   She underwent breast MRI on 06/24/2019 showing: breast composition D; suspicious 8 mm enhancing mass located approximately 2.5 cm superior and medial to biopsy site; no evidence of right breast malignancy; no suspicious lymphadenopathy within limits of the study.  MRI biopsy of the enhancing mass in the upper central left breast was performed today, with results pending.  The patient's subsequent history is as detailed below.   INTERVAL HISTORY: [name] was evaluated in the breast cancer clinic on 07/07/2019. Her case was also presented at the multidisciplinary breast cancer conference on 06/22/2019. At that time a preliminary plan was proposed: Breast conserving surgery, MRI because of the breast density, adjuvant  radiation, consideration of the comet trial, antiestrogens  REVIEW OF SYSTEMS: There were no specific symptoms leading to the original mammogram, which was routinely scheduled. The patient denies unusual headaches, visual changes, nausea, vomiting, stiff neck, dizziness, or gait imbalance.  She has a bronchitic cough which is productive of phlegm but she says this is not a new problem.  She denies pleurisy, and there have been no chest pain or pressure, and no change in bowel or bladder habits. The patient denies fever, rash, bleeding, unexplained fatigue or unexplained weight loss. A detailed review of systems was otherwise entirely negative.   PAST MEDICAL HISTORY: Past Medical History:  Diagnosis Date  . Anxiety   . Bronchitis age 18  . Depression   . Hx of bladder infections    been over 5 years since last one   . Rheumatoid arthritis ([place])    unc rheum   . Tobacco abuse   . Trouble in sleeping     PAST SURGICAL HISTORY: Past Surgical History:  Procedure Laterality Date  . COLONOSCOPY WITH PROPOFOL N/A 03/15/2019   Procedure: COLONOSCOPY WITH PROPOFOL;  Surgeon: Jonathon Bellows, MD;  Location: [facility] ENDOSCOPY;  Service: Gastroenterology;  Laterality: N/A;  . HIP ARTHROPLASTY    . JOINT REPLACEMENT    . TOTAL HIP ARTHROPLASTY Left 02/15/2018   Procedure: TOTAL HIP ARTHROPLASTY ANTERIOR APPROACH;  Surgeon: [name], MD;  Location: [facility] ORS;  Service: Orthopedics;  Laterality: Left;  . WISDOM TOOTH EXTRACTION      FAMILY HISTORY: Family History  Problem Relation Age of Onset  . Breast cancer Mother   . Cervical cancer Mother   . Cancer Brother   . Healthy Son   The patient's father died in his 22s.  The patient tells me she essentially has no information on him  or his side of the family.  The patient's mother was diagnosed with breast cancer age 68 and died age 17.  She also had a history of uterine cancer.  The patient's mother's mother died at age 52 and developed cancer at age  71.  She does not know what kind of cancer this was.  The patient's mother had 5 sisters and 5 brothers.  1 brother had lung cancer and died at age 65 from it, in the setting of tobacco abuse.  The patient tells me she has 67 Cousins and that the son of one of her cousins has died from cancer but she does not know the type.   GYNECOLOGIC HISTORY:  No LMP recorded. Patient is postmenopausal. Menarche: 59 years old Age at first live birth: 59 years old [place] P 1 LMP 76 Contraceptive intermittently, with no complications HRT no  Hysterectomy?  No BSO?  No   SOCIAL HISTORY: (updated 06/2019)  [name] describes herself as homeless.  She is currently staying with friends.  She is on disability secondary to her hip problems.  Her son [name] works in Biomedical scientist and is also a Furniture conservator/restorer.  The patient has 2 step grandchildren aged 14 and 44, the 20 year old being special needs.  The patient describes herself as spiritual and nondenominational  ADVANCED DIRECTIVES: Not discussed   HEALTH MAINTENANCE: Social History   Tobacco Use  . Smoking status: Current Some Day Smoker    Packs/day: 0.25    Types: Cigarettes    Last attempt to quit: 08/13/2018    Years since quitting: 0.8  . Smokeless tobacco: Never Used  Substance Use Topics  . Alcohol use: Yes    Comment: rarely   . Drug use: Not Currently    Types: Cocaine    Comment: last used in 2004     Colonoscopy: 03/2019 (Dr. Vicente Males), repeat 2024  PAP: 03/2018, negative  Bone density: none on file   No Known Allergies  Current Outpatient Medications  Medication Sig Dispense Refill  . albuterol (VENTOLIN HFA) 108 (90 Base) MCG/ACT inhaler Inhale 2 puffs into the lungs every 6 (six) hours as needed for wheezing or shortness of breath. 18 g 2  . budesonide-formoterol (SYMBICORT) 80-4.5 MCG/ACT inhaler Inhale 2 puffs into the lungs 2 (two) times daily. 1 Inhaler 3  . cyanocobalamin 100 MCG tablet Take 100 mcg by mouth daily.    . Etanercept (ENBREL  MINI) 50 MG/ML SOCT Inject 50 mg into the skin once a week. 12 mL 0  . folic acid (FOLVITE) 1 MG tablet Take 2 tablets (2 mg total) by mouth daily. 180 tablet 3  . Methotrexate, PF, (OTREXUP) 20 MG/0.4ML SOAJ Inject 20 mg into the skin once a week. 12 pen 0  . Multiple Vitamin (MULTIVITAMIN WITH MINERALS) TABS tablet Take 1 tablet by mouth daily.    . nicotine (NICODERM CQ - DOSED IN MG/24 HOURS) 14 mg/24hr patch PLACE 1 PATCH (14 MG TOTAL) ONTO THE SKIN DAILY. 28 patch 1  . nicotine (NICODERM CQ - DOSED IN MG/24 HOURS) 14 mg/24hr patch 14 mg daily.    . Cholecalciferol (VITAMIN D3) 75 MCG (3000 UT) TABS Take 3,000 Units by mouth daily.    . diazepam (VALIUM) 2 MG tablet Take 1/2 tab 1 hr prior to your procedure.  If needed, you can take the other 1/2 at the time of the procedure. (Patient not taking: Reported on 07/07/2019) 1 tablet 0  . predniSONE (DELTASONE) 5 MG tablet Take 4 tablets  by mouth daily x2 days, 3 tablets by mouth daily x2 days, 2 tablets by mouth daily x2 days, 1 tablet by mouth daily x2 days. (Patient not taking: Reported on 07/07/2019) 20 tablet 0  . traMADol (ULTRAM) 50 MG tablet Take 1 tablet (50 mg total) by mouth every 12 (twelve) hours as needed. (Patient not taking: Reported on 07/07/2019) 30 tablet 0   No current facility-administered medications for this visit.    OBJECTIVE: White woman who appears stated age  59:   07/07/19 1519  BP: 127/75  Pulse: 88  Resp: 18  Temp: 98.5 F (36.9 C)  SpO2: 97%     Body mass index is 16.68 kg/m.   Wt Readings from Last 3 Encounters:  07/07/19 101 lb 12.8 oz (46.2 kg)  07/07/19 102 lb 6.4 oz (46.4 kg)  07/07/19 102 lb 9.6 oz (46.5 kg)      ECOG FS:1 - Symptomatic but completely ambulatory  Ocular: Sclerae unicteric, pupils round and equal Ear-nose-throat: Wearing a mask Lymphatic: No cervical or supraclavicular adenopathy Lungs no rales or rhonchi; evaluation today was interrupted by 2 separate bouts of severe  bronchitic cough which was productive Heart regular rate and rhythm Abd soft, nontender, positive bowel sounds MSK no focal spinal tenderness, no joint edema Neuro: non-focal, well-oriented, appropriate affect Breasts: Both breasts are "lumpy" and difficult to examine.  There are no obvious skin or nipple changes of concern.  Left breast is status post recent biopsy.  Both axillae are benign.   LAB RESULTS:  CMP     Component Value Date/Time   NA 141 07/07/2019 1457   NA 142 01/15/2018 1509   K 4.1 07/07/2019 1457   CL 104 07/07/2019 1457   CO2 26 07/07/2019 1457   GLUCOSE 91 07/07/2019 1457   BUN 14 07/07/2019 1457   BUN 16 01/15/2018 1509   CREATININE 0.96 07/07/2019 1457   CREATININE 0.96 05/03/2019 1557   CALCIUM 9.4 07/07/2019 1457   PROT 7.4 07/07/2019 1457   PROT 6.8 01/15/2018 1509   ALBUMIN 4.0 07/07/2019 1457   ALBUMIN 4.3 01/15/2018 1509   AST 34 07/07/2019 1457   ALT 27 07/07/2019 1457   ALKPHOS 95 07/07/2019 1457   BILITOT 0.8 07/07/2019 1457   GFRNONAA >60 07/07/2019 1457   GFRNONAA 64 05/03/2019 1557   GFRAA >60 07/07/2019 1457   GFRAA 75 05/03/2019 1557    Lab Results  Component Value Date   ALBUMINELP 4.1 05/11/2018   A1GS 0.4 (H) 05/11/2018   A2GS 0.8 05/11/2018   BETS 0.5 05/11/2018   BETA2SER 0.4 05/11/2018   GAMS 0.8 05/11/2018   SPEI  05/11/2018     Comment:     . A poorly-defined band of restricted protein mobility is detected in the gamma globulins. It is unlikely that this may represent a monoclonal protein; however, immunofixation analysis is available if clinically indicated. .     Lab Results  Component Value Date   WBC 6.1 07/07/2019   NEUTROABS 3.9 07/07/2019   HGB 14.0 07/07/2019   HCT 42.5 07/07/2019   MCV 88.2 07/07/2019   PLT 211 07/07/2019    No results found for: LABCA2  No components found for: BTDVVO160  No results for input(s): INR in the last 168 hours.  No results found for: LABCA2  No results found  for: VPX106  No results found for: YIR485  No results found for: IOE703  No results found for: CA2729  No components found for: HGQUANT  No  results found for: CEA1 / No results found for: CEA1   No results found for: AFPTUMOR  No results found for: CHROMOGRNA  No results found for: KPAFRELGTCHN, LAMBDASER, KAPLAMBRATIO (kappa/lambda light chains)  No results found for: HGBA, HGBA2QUANT, HGBFQUANT, HGBSQUAN (Hemoglobinopathy evaluation)   No results found for: LDH  No results found for: IRON, TIBC, IRONPCTSAT (Iron and TIBC)  No results found for: FERRITIN  Urinalysis    Component Value Date/Time   COLORURINE YELLOW 05/11/2018 0957   APPEARANCEUR CLEAR 05/11/2018 0957   LABSPEC 1.015 05/11/2018 0957   PHURINE < OR = 5.0 05/11/2018 0957   GLUCOSEU NEGATIVE 05/11/2018 0957   HGBUR NEGATIVE 05/11/2018 0957   BILIRUBINUR NEGATIVE 02/08/2018 1545   KETONESUR NEGATIVE 05/11/2018 0957   PROTEINUR NEGATIVE 05/11/2018 0957   NITRITE NEGATIVE 05/11/2018 0957   LEUKOCYTESUR NEGATIVE 05/11/2018 0957     STUDIES: MR BREAST BILATERAL W WO CONTRAST INC CAD  Result Date: 06/27/2019 CLINICAL DATA:  59 year old female with newly diagnosed DCIS of the upper outer left breast. LABS:  None performed on site. EXAM: BILATERAL BREAST MRI WITH AND WITHOUT CONTRAST TECHNIQUE: Multiplanar, multisequence MR images of both breasts were obtained prior to and following the intravenous administration of 5 ml of Gadavist. Three-dimensional MR images were rendered by post-processing of the original MR data on an independent workstation. The three-dimensional MR images were interpreted, and findings are reported in the following complete MRI report for this study. Three dimensional images were evaluated at the independent DynaCad workstation COMPARISON:  Previous exam(s). FINDINGS: Breast composition: d. Extreme fibroglandular tissue. Background parenchymal enhancement: Mild. Right breast: No suspicious  mass or abnormal enhancement. Left breast: Susceptibility artifact from post biopsy clip and associated post biopsy changes are seen in the posterior upper outer left breast (series 8, image 58/144). This is consistent with the patient's biopsy-proven site of DCIS. Superior and slightly medial to the biopsy site, there is an irregular, enhancing mass in the superior aspect at middle depth (series 8, image 48/144). It measures 8 x 7 x 8 mm. This mass is located approximately 2.5 cm from the post biopsy clip. Lymph nodes: No abnormal appearing lymph nodes. Please note, the bilateral axilla are not well included within the field of view. Ancillary findings:  None. IMPRESSION: 1. Post-biopsy changes in the upper outer left breast at posterior depth at the site of biopsy-proven site of DCIS. 2. Suspicious 8 mm enhancing mass located approximately 2.5 cm superior and medial to the biopsy site. This may represent an additional site of invasive disease. Recommendation is to proceed directly with MRI guided biopsy. This area is felt unlikely to be seen by second-look ultrasound given the size and patient's breast density. 3. No MRI evidence of malignancy on the right. 4. No suspicious lymphadenopathy within the limits of this study. Please note, the bilateral axillary are not well included within the field of view. RECOMMENDATION: 1. MRI guided biopsy of the left breast. 2. If above biopsy demonstrates an additional site of invasive disease, ultrasound evaluation of the left axilla should be performed as this is not fully included on today's field of view. BI-RADS CATEGORY  4: Suspicious. Electronically Signed   By: Kristopher Oppenheim M.D.   On: 06/27/2019 08:04   MM CLIP PLACEMENT LEFT  Result Date: 07/07/2019 CLINICAL DATA:  Post MRI guided biopsy of an enhancing mass in the upper central left breast. EXAM: DIAGNOSTIC LEFT MAMMOGRAM POST MRI BIOPSY COMPARISON:  Previous exams. FINDINGS: Mammographic images were obtained  following  MRI guided biopsy of an enhancing mass in the upper central left breast. The dumbbell shaped biopsy marking clip is felt to be positioned at the site of the enhancing mass in the upper central left breast however it was not included in the field of view on the CC images. IMPRESSION: Dumbbell shaped biopsy marking clip felt to be positioned at the site of the enhancing mass in the upper central left breast however it was not included in the field of view on the C images. Final Assessment: Post Procedure Mammograms for Marker Placement Electronically Signed   By: Everlean Alstrom M.D.   On: 07/07/2019 10:12   MM CLIP PLACEMENT LEFT  Result Date: 06/13/2019 CLINICAL DATA:  Status post stereotactic core needle biopsy of left breast calcifications. Evaluate biopsy marker clip placement. EXAM: DIAGNOSTIC LEFT MAMMOGRAM POST STEREOTACTIC BIOPSY COMPARISON:  Previous exam(s). FINDINGS: Mammographic images were obtained following stereotactic guided biopsy of left breast calcifications. The biopsy marking clip is in expected position at the site of biopsy. IMPRESSION: Appropriate positioning of the coil shaped biopsy marking clip at the site of biopsy in the upper outer quadrant, close to 12 o'clock, adjacent to residual calcifications. Final Assessment: Post Procedure Mammograms for Marker Placement Electronically Signed   By: Lajean Manes M.D.   On: 06/13/2019 12:42   MM LT BREAST BX W LOC DEV 1ST LESION IMAGE BX SPEC STEREO GUIDE  Addendum Date: 06/15/2019   ADDENDUM REPORT: 06/15/2019 10:56 ADDENDUM: Pathology revealed INTERMEDIATE GRADE DUCTAL CARCINOMA IN SITU WITH CALCIFICATIONS of the LEFT breast, upper outer quadrant. This was found to be concordant by Dr. Lajean Manes. Pathology results were discussed with the patient by telephone. The patient reported doing well after the biopsy with tenderness at the site. Post biopsy instructions and care were reviewed and questions were answered. The patient  was encouraged to call [facility] for any additional concerns. Surgical consultation has been arranged with Dr. Autumn Messing at [facility] on June 21, 2019. Consider MRI and/or additional stereotactic biopsy of calcifications in the upper LEFT breast to determine extent of disease. Pathology results reported by Stacie Acres RN on 06/15/2019. Electronically Signed   By: Lajean Manes M.D.   On: 06/15/2019 10:56   Result Date: 06/15/2019 CLINICAL DATA:  Patient presents for stereotactic core needle biopsy of left breast calcifications. EXAM: LEFT BREAST STEREOTACTIC CORE NEEDLE BIOPSY COMPARISON:  Previous exams. FINDINGS: The patient and I discussed the procedure of stereotactic-guided biopsy including benefits and alternatives. We discussed the high likelihood of a successful procedure. We discussed the risks of the procedure including infection, bleeding, tissue injury, clip migration, and inadequate sampling. Informed written consent was given. The usual time out protocol was performed immediately prior to the procedure. Using sterile technique and 1% Lidocaine as local anesthetic, under stereotactic guidance, a 9 gauge vacuum assisted device was used to perform core needle biopsy of calcifications in the upper outer quadrant of the left breast using a lateral approach. Specimen radiograph was performed showing calcifications for which biopsy was performed. Specimens with calcifications are identified for pathology. Lesion quadrant: Upper outer quadrant At the conclusion of the procedure, coil shaped tissue marker clip was deployed into the biopsy cavity. Follow-up 2-view mammogram was performed and dictated separately. IMPRESSION: Stereotactic-guided biopsy of left breast calcifications. No apparent complications. Electronically Signed: By: Lajean Manes M.D. On: 06/13/2019 12:41   MR LT BREAST BX W LOC DEV 1ST LESION IMAGE BX SPEC MR GUIDE  Result Date:  07/07/2019 CLINICAL DATA:  59 year old female with an enhancing mass in the upper central left breast. EXAM: MRI GUIDED CORE NEEDLE BIOPSY OF THE LEFT BREAST TECHNIQUE: Multiplanar, multisequence MR imaging of the left breast was performed both before and after administration of intravenous contrast. CONTRAST:  36m GADAVIST GADOBUTROL 1 MMOL/ML IV SOLN COMPARISON:  Previous exams. FINDINGS: I met with the patient, and we discussed the procedure of MRI guided biopsy, including risks, benefits, and alternatives. Specifically, we discussed the risks of infection, bleeding, tissue injury, clip migration, and inadequate sampling. Informed, written consent was given. The usual time out protocol was performed immediately prior to the procedure. Using sterile technique, 1% Lidocaine, MRI guidance, and a 9 gauge vacuum assisted device, biopsy was performed of the enhancing mass in the upper central left breast using a lateral to medial approach. At the conclusion of the procedure, a a dumbbell shaped tissue marker clip was deployed into the biopsy cavity. Follow-up 2-view mammogram was performed and dictated separately. IMPRESSION: MRI guided biopsy of the enhancing mass in the upper central left breast. No apparent complications. Electronically Signed   By: JEverlean AlstromM.D.   On: 07/07/2019 10:10     ELIGIBLE FOR AVAILABLE RESEARCH PROTOCOL: Not a COMET candidate given presence of a mass  ASSESSMENT: 59y.o. GFernand Parkins [name] woman status post left breast biopsy 06/13/2019 for ductal carcinoma in situ, grade 2, estrogen and progesterone receptor positive.  (a) MRI biopsy of a 0.8 cm left breast mass 07/07/2019, results pending  (1) definitive surgery to follow  (2) adjuvant radiation as appropriate  (3) antiestrogens at the end of local treatment   PLAN: I met today with DTerethato review her new diagnosis. Specifically we discussed the biology of her breast cancer, its diagnosis, staging, treatment   options and prognosis. DLatesiaunderstands that in noninvasive ductal carcinoma, also called ductal carcinoma in situ ("DCIS") the breast cancer cells remain trapped in the ducts were they started. They cannot travel to a vital organ. For that reason these cancers in themselves are not life-threatening.  If the whole breast is removed then all the ducts are removed and since the cancer cells are trapped in the ducts, the cure rate with mastectomy for noninvasive breast cancer is approximately 99%. Nevertheless we recommend lumpectomy, because there is no survival advantage to mastectomy and because the cosmetic result is generally superior with breast conservation.  Since the patient is keeping her breasts, there will be some risk of recurrence. The recurrence can only be in the same breast since, again, the cells are trapped in the ducts. There is no connection from one breast to the other. The risk of local recurrence is cut by more than half with radiation, which is standard in this situation.  In estrogen receptor positive cancers like [name], anti-estrogens can also be considered. They will further reduce the risk of recurrence by one half. In addition anti-estrogens will lower the risk of a new breast cancer developing in either breast, also by one half. That risk otherwise approaches 1% per year.   DDamariadoes have very dense breasts which is the reason an MRI was obtained.  As noted above this showed a 0.8 cm mass in the left breast which was biopsied today.  Results of the biopsy are pending and may affect the surgical choice (for example whether or not a sentinel lymph node sampling will be performed.  DMakaiyahas a good understanding of the overall plan. She agrees with  it. She knows the goal of treatment in her case is cure. She will call with any problems that may develop before her next visit here.  Total encounter time 60 minutes.Sarajane Jews C. Raksha Wolfgang, MD 07/07/2019 5:32 PM Medical  Oncology and Hematology [facility] [place], [place] [address] Tel. [phone]    Fax. [phone]   This document serves as a record of services personally performed by Lurline Del, MD. It was created on his behalf by Wilburn Mylar, a trained medical scribe. The creation of this record is based on the scribe's personal observations and the provider's statements to them.   I, Lurline Del MD, have reviewed the above documentation for accuracy and completeness, and I agree with the above.    *Total Encounter Time as defined by the Centers for Medicare and Medicaid Services includes, in addition to the face-to-face time of a patient visit (documented in the note above) non-face-to-face time: obtaining and reviewing outside history, ordering and reviewing medications, tests or procedures, care coordination (communications with other health care professionals or caregivers) and documentation in the medical record.

## 2019-07-06 NOTE — Progress Notes (Signed)
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 female who is accompanied by no one. She had routine screening mammography on 05/10/2019 that showed calcifications in the left breast. She then underwent unilateral diagnostic mammography on 05/27/2019 that showed indeterminate calcifications in the superior central left breast.  Biopsy on 06/13/2019 revealed intermediate grade ductal carcinoma in situ with calcifications. Prognostic indicators significant for estrogen receptor, 100% positive and progesterone receptor, 60% positive, both with strong staining intensities.  She underwent breast MRI on 06/24/2019 showing: breast composition D; suspicious 8 mm enhancing mass located approximately 2.5 cm superior and medial to biopsy site; no evidence of right breast malignancy; no suspicious lymphadenopathy within limits of the study.  MRI biopsy of the enhancing mass in the upper central left breast was performed today, with results pending.  Of note, she received her COVID-19 vaccination on 06/27/2019.   She is also scheduled to see Dr. [name] and Dr. [name] in consultation today.  PREVIOUS RADIATION THERAPY: No  PAST MEDICAL HISTORY:  Past Medical History:  Diagnosis Date  . Anxiety   . Bronchitis age 21  . Depression   . Hx of bladder infections    been over 5 years since last one   . Rheumatoid arthritis ([place])    unc rheum   . Tobacco abuse   . Trouble in sleeping     PAST SURGICAL HISTORY: Past Surgical History:  Procedure Laterality Date  . COLONOSCOPY WITH  PROPOFOL N/A 03/15/2019   Procedure: COLONOSCOPY WITH PROPOFOL;  Surgeon: Jonathon Bellows, MD;  Location: [facility];  Service: Gastroenterology;  Laterality: N/A;  . HIP ARTHROPLASTY    . JOINT REPLACEMENT    . TOTAL HIP ARTHROPLASTY Left 02/15/2018   Procedure: TOTAL HIP ARTHROPLASTY ANTERIOR APPROACH;  Surgeon: [name], MD;  Location: [facility] ORS;  Service: Orthopedics;  Laterality: Left;  . WISDOM TOOTH EXTRACTION      FAMILY HISTORY:  Family History  Problem Relation Age of Onset  . Breast cancer Mother   . Cervical cancer Mother   . Cancer Brother   . Healthy Son     SOCIAL HISTORY:  Social History   Tobacco Use  . Smoking status: Current Some Day Smoker    Packs/day: 0.25    Types: Cigarettes    Last attempt to quit: 08/13/2018    Years since quitting: 0.8  . Smokeless tobacco: Never Used  Substance Use Topics  . Alcohol use: Yes    Comment: rarely   . Drug use: Not Currently    Types: Cocaine    Comment: last used in 2004    ALLERGIES: No Known Allergies  MEDICATIONS:  Current Outpatient Medications  Medication Sig Dispense Refill  . albuterol (VENTOLIN HFA) 108 (90 Base) MCG/ACT inhaler Inhale 2 puffs into the lungs every 6 (six) hours as needed for wheezing or shortness of breath. 18 g 2  . budesonide-formoterol (SYMBICORT) 80-4.5 MCG/ACT inhaler Inhale 2 puffs into the lungs 2 (two) times daily. 1 Inhaler 3  . Cholecalciferol (VITAMIN D3) 75 MCG (3000 UT) TABS Take  3,000 Units by mouth daily.    . diazepam (VALIUM) 2 MG tablet Take 1/2 tab 1 hr prior to your procedure.  If needed, you can take the other 1/2 at the time of the procedure. (Patient not taking: Reported on 07/07/2019) 1 tablet 0  . Etanercept (ENBREL MINI) 50 MG/ML SOCT Inject 50 mg into the skin once a week. 12 mL 0  . folic acid (FOLVITE) 1 MG tablet Take 2 tablets (2 mg total) by mouth daily. 180 tablet 3  . Methotrexate, PF, (OTREXUP) 20 MG/0.4ML SOAJ Inject 20 mg into the skin once a week. 12 pen  0  . Multiple Vitamin (MULTIVITAMIN WITH MINERALS) TABS tablet Take 1 tablet by mouth daily.    . nicotine (NICODERM CQ - DOSED IN MG/24 HOURS) 14 mg/24hr patch PLACE 1 PATCH (14 MG TOTAL) ONTO THE SKIN DAILY. 28 patch 1  . nicotine (NICODERM CQ - DOSED IN MG/24 HOURS) 14 mg/24hr patch 14 mg daily.    . predniSONE (DELTASONE) 5 MG tablet Take 4 tablets by mouth daily x2 days, 3 tablets by mouth daily x2 days, 2 tablets by mouth daily x2 days, 1 tablet by mouth daily x2 days. (Patient not taking: Reported on 07/07/2019) 20 tablet 0  . traMADol (ULTRAM) 50 MG tablet Take 1 tablet (50 mg total) by mouth every 12 (twelve) hours as needed. (Patient not taking: Reported on 07/07/2019) 30 tablet 0  . cyanocobalamin 100 MCG tablet Take 100 mcg by mouth daily.     No current facility-administered medications for this encounter.    REVIEW OF SYSTEMS:  A 10+ POINT REVIEW OF SYSTEMS WAS OBTAINED including neurology, dermatology, psychiatry, cardiac, respiratory, lymph, extremities, GI, GU, musculoskeletal, constitutional, reproductive, HEENT.  She denies any pain within the breast area nipple discharge or bleeding prior to biopsy.  Patient reports significant joint issues related to her rheumatoid arthritis   PHYSICAL EXAM:  height is 5' 5.5" (1.664 m) and weight is 102 lb 6.4 oz (46.4 kg). Her temperature is 98.5 F (36.9 C). Her blood pressure is 102/78 and her pulse is 96. Her respiration is 18 and oxygen saturation is 97%.   General: Alert and oriented, in no acute distress HEENT: Head is normocephalic. Extraocular movements are intact.  Neck: Neck is supple, no palpable cervical or supraclavicular lymphadenopathy. Heart: Regular in rate and rhythm with no murmurs, rubs, or gallops. Chest: Clear to auscultation bilaterally, with no rhonchi, wheezes, or rales. Abdomen: Soft, nontender, nondistended, with no rigidity or guarding. Extremities: No cyanosis or edema.  Signs of rheumatoid arthritis present in  the hands Lymphatics: see Neck Exam Skin: No concerning lesions. Musculoskeletal: symmetric strength and muscle tone throughout. Neurologic: Cranial nerves II through XII are grossly intact. No obvious focalities. Speech is fluent. Coordination is intact. Psychiatric: Judgment and insight are intact. Affect is appropriate. Right breast: No palpable mass, nipple discharge, or bleeding. Left breast: Small biopsy site present in the upper central breast area.  No palpable mass nipple discharge or bleeding.  Additional biopsy site more superior and medial performed earlier today under MRI guidance, pathology pending from this second biopsy  ECOG = 1  0 - Asymptomatic (Fully active, able to carry on all predisease activities without restriction)  1 - Symptomatic but completely ambulatory (Restricted in physically strenuous activity but ambulatory and able to carry out work of a light or sedentary nature. For example, light housework, office work)  2 - Symptomatic, <50% in bed during the day (Ambulatory and capable  of all self care but unable to carry out any work activities. Up and about more than 50% of waking hours)  3 - Symptomatic, >50% in bed, but not bedbound (Capable of only limited self-care, confined to bed or chair 50% or more of waking hours)  4 - Bedbound (Completely disabled. Cannot carry on any self-care. Totally confined to bed or chair)  5 - Death   Eustace Pen MM, Creech RH, Tormey DC, et al. [phone]). "Toxicity and response criteria of the [facility]". [place] Oncol. 5 (6): 649-55  LABORATORY DATA:  Lab Results  Component Value Date   WBC 6.1 07/07/2019   HGB 14.0 07/07/2019   HCT 42.5 07/07/2019   MCV 88.2 07/07/2019   PLT 211 07/07/2019   NEUTROABS 3.9 07/07/2019   Lab Results  Component Value Date   NA 141 07/07/2019   K 4.1 07/07/2019   CL 104 07/07/2019   CO2 26 07/07/2019   GLUCOSE 91 07/07/2019   CREATININE 0.96 07/07/2019   CALCIUM  9.4 07/07/2019      RADIOGRAPHY: MR BREAST BILATERAL W WO CONTRAST INC CAD  Result Date: 06/27/2019 CLINICAL DATA:  59 year old female with newly diagnosed DCIS of the upper outer left breast. LABS:  None performed on site. EXAM: BILATERAL BREAST MRI WITH AND WITHOUT CONTRAST TECHNIQUE: Multiplanar, multisequence MR images of both breasts were obtained prior to and following the intravenous administration of 5 ml of Gadavist. Three-dimensional MR images were rendered by post-processing of the original MR data on an independent workstation. The three-dimensional MR images were interpreted, and findings are reported in the following complete MRI report for this study. Three dimensional images were evaluated at the independent Dyna[employer]ad workstation COMPARISON:  Previous exam(s). FINDINGS: Breast composition: d. Extreme fibroglandular tissue. Background parenchymal enhancement: Mild. Right breast: No suspicious mass or abnormal enhancement. Left breast: Susceptibility artifact from post biopsy clip and associated post biopsy changes are seen in the posterior upper outer left breast (series 8, image 58/144). This is consistent with the patient's biopsy-proven site of DCIS. Superior and slightly medial to the biopsy site, there is an irregular, enhancing mass in the superior aspect at middle depth (series 8, image 48/144). It measures 8 x 7 x 8 mm. This mass is located approximately 2.5 cm from the post biopsy clip. Lymph nodes: No abnormal appearing lymph nodes. Please note, the bilateral axilla are not well included within the field of view. Ancillary findings:  None. IMPRESSION: 1. Post-biopsy changes in the upper outer left breast at posterior depth at the site of biopsy-proven site of DCIS. 2. Suspicious 8 mm enhancing mass located approximately 2.5 cm superior and medial to the biopsy site. This may represent an additional site of invasive disease. Recommendation is to proceed directly with MRI guided biopsy.  This area is felt unlikely to be seen by second-look ultrasound given the size and patient's breast density. 3. No MRI evidence of malignancy on the right. 4. No suspicious lymphadenopathy within the limits of this study. Please note, the bilateral axillary are not well included within the field of view. RECOMMENDATION: 1. MRI guided biopsy of the left breast. 2. If above biopsy demonstrates an additional site of invasive disease, ultrasound evaluation of the left axilla should be performed as this is not fully included on today's field of view. BI-RADS CATEGORY  4: Suspicious. Electronically Signed   By: Kristopher Oppenheim M.D.   On: 06/27/2019 08:04   MM CLIP PLACEMENT LEFT  Result Date: 07/07/2019  CLINICAL DATA:  Post MRI guided biopsy of an enhancing mass in the upper central left breast. EXAM: DIAGNOSTIC LEFT MAMMOGRAM POST MRI BIOPSY COMPARISON:  Previous exams. FINDINGS: Mammographic images were obtained following MRI guided biopsy of an enhancing mass in the upper central left breast. The dumbbell shaped biopsy marking clip is felt to be positioned at the site of the enhancing mass in the upper central left breast however it was not included in the field of view on the CC images. IMPRESSION: Dumbbell shaped biopsy marking clip felt to be positioned at the site of the enhancing mass in the upper central left breast however it was not included in the field of view on the C images. Final Assessment: Post Procedure Mammograms for Marker Placement Electronically Signed   By: Everlean Alstrom M.D.   On: 07/07/2019 10:12   MM CLIP PLACEMENT LEFT  Result Date: 06/13/2019 CLINICAL DATA:  Status post stereotactic core needle biopsy of left breast calcifications. Evaluate biopsy marker clip placement. EXAM: DIAGNOSTIC LEFT MAMMOGRAM POST STEREOTACTIC BIOPSY COMPARISON:  Previous exam(s). FINDINGS: Mammographic images were obtained following stereotactic guided biopsy of left breast calcifications. The biopsy marking  clip is in expected position at the site of biopsy. IMPRESSION: Appropriate positioning of the coil shaped biopsy marking clip at the site of biopsy in the upper outer quadrant, close to 12 o'clock, adjacent to residual calcifications. Final Assessment: Post Procedure Mammograms for Marker Placement Electronically Signed   By: Lajean Manes M.D.   On: 06/13/2019 12:42   MM LT BREAST BX W LOC DEV 1ST LESION IMAGE BX SPEC STEREO GUIDE  Addendum Date: 06/15/2019   ADDENDUM REPORT: 06/15/2019 10:56 ADDENDUM: Pathology revealed INTERMEDIATE GRADE DUCTAL CARCINOMA IN SITU WITH CALCIFICATIONS of the LEFT breast, upper outer quadrant. This was found to be concordant by Dr. Lajean Manes. Pathology results were discussed with the patient by telephone. The patient reported doing well after the biopsy with tenderness at the site. Post biopsy instructions and care were reviewed and questions were answered. The patient was encouraged to call [facility] for any additional concerns. Surgical consultation has been arranged with Dr. Autumn Messing at [facility] on June 21, 2019. Consider MRI and/or additional stereotactic biopsy of calcifications in the upper LEFT breast to determine extent of disease. Pathology results reported by Stacie Acres RN on 06/15/2019. Electronically Signed   By: Lajean Manes M.D.   On: 06/15/2019 10:56   Result Date: 06/15/2019 CLINICAL DATA:  Patient presents for stereotactic core needle biopsy of left breast calcifications. EXAM: LEFT BREAST STEREOTACTIC CORE NEEDLE BIOPSY COMPARISON:  Previous exams. FINDINGS: The patient and I discussed the procedure of stereotactic-guided biopsy including benefits and alternatives. We discussed the high likelihood of a successful procedure. We discussed the risks of the procedure including infection, bleeding, tissue injury, clip migration, and inadequate sampling. Informed written consent was given. The usual time out protocol  was performed immediately prior to the procedure. Using sterile technique and 1% Lidocaine as local anesthetic, under stereotactic guidance, a 9 gauge vacuum assisted device was used to perform core needle biopsy of calcifications in the upper outer quadrant of the left breast using a lateral approach. Specimen radiograph was performed showing calcifications for which biopsy was performed. Specimens with calcifications are identified for pathology. Lesion quadrant: Upper outer quadrant At the conclusion of the procedure, coil shaped tissue marker clip was deployed into the biopsy cavity. Follow-up 2-view mammogram was performed and dictated separately. IMPRESSION: Stereotactic-guided  biopsy of left breast calcifications. No apparent complications. Electronically Signed: By: Lajean Manes M.D. On: 06/13/2019 12:41   MR LT BREAST BX W LOC DEV 1ST LESION IMAGE BX SPEC MR GUIDE  Result Date: 07/07/2019 CLINICAL DATA:  59 year old female with an enhancing mass in the upper central left breast. EXAM: MRI GUIDED CORE NEEDLE BIOPSY OF THE LEFT BREAST TECHNIQUE: Multiplanar, multisequence MR imaging of the left breast was performed both before and after administration of intravenous contrast. CONTRAST:  57m GADAVIST GADOBUTROL 1 MMOL/ML IV SOLN COMPARISON:  Previous exams. FINDINGS: I met with the patient, and we discussed the procedure of MRI guided biopsy, including risks, benefits, and alternatives. Specifically, we discussed the risks of infection, bleeding, tissue injury, clip migration, and inadequate sampling. Informed, written consent was given. The usual time out protocol was performed immediately prior to the procedure. Using sterile technique, 1% Lidocaine, MRI guidance, and a 9 gauge vacuum assisted device, biopsy was performed of the enhancing mass in the upper central left breast using a lateral to medial approach. At the conclusion of the procedure, a a dumbbell shaped tissue marker clip was deployed into  the biopsy cavity. Follow-up 2-view mammogram was performed and dictated separately. IMPRESSION: MRI guided biopsy of the enhancing mass in the upper central left breast. No apparent complications. Electronically Signed   By: JEverlean AlstromM.D.   On: 07/07/2019 10:10      IMPRESSION: Stage 0, Left Breast UOQ, DCIS, ER+ / PR+, Grade 2  The patient would appear to be a good candidate for breast conservation with anticipated surgery and adjuvant radiation therapy.  As above the patient underwent additional biopsy and results are pending at this time.  This is in the same general vicinity as her first biopsy and potentially could be encompassed in 1 excision per evaluation by general surgery (although breast size may be a limiting factor) if the second biopsy turns out to be malignant.  I discussed the general course of treatment side effects and potential toxicities of radiation therapy in this situation with the patient.  She appears to understand and wishes to proceed with planned course of therapy if indicated.  She understands that she will need to temporarily hold her methotrexate during her radiation therapy.    PLAN: Patient will proceed with medical oncology and surgery consultation.  Anticipate surgery being scheduled in the next 2 to 3 weeks.  Patient will be seen in the postoperative setting for further discussion of adjuvant radiation therapy.    ------------------------------------------------  JBlair Promise PhD, MD  This document serves as a record of services personally performed by JGery Pray MD. It was created on his behalf by MClerance Lav a trained medical scribe. The creation of this record is based on the scribe's personal observations and the provider's statements to them. This document has been checked and approved by the attending provider.

## 2019-07-07 ENCOUNTER — Encounter: Payer: Self-pay | Admitting: Physician Assistant

## 2019-07-07 ENCOUNTER — Telehealth: Payer: Self-pay | Admitting: *Deleted

## 2019-07-07 ENCOUNTER — Inpatient Hospital Stay: Payer: Medicare Other | Attending: Oncology | Admitting: Oncology

## 2019-07-07 ENCOUNTER — Ambulatory Visit (INDEPENDENT_AMBULATORY_CARE_PROVIDER_SITE_OTHER): Payer: Medicare Other | Admitting: Physician Assistant

## 2019-07-07 ENCOUNTER — Ambulatory Visit
Admission: RE | Admit: 2019-07-07 | Discharge: 2019-07-07 | Disposition: A | Discharge status: Home / Self Care | Payer: Medicare Other | Source: Ambulatory Visit | Attending: Radiation Oncology | Admitting: Radiation Oncology

## 2019-07-07 ENCOUNTER — Other Ambulatory Visit: Payer: Self-pay

## 2019-07-07 ENCOUNTER — Ambulatory Visit
Admission: RE | Admit: 2019-07-07 | Discharge: 2019-07-07 | Disposition: A | Discharge status: Home / Self Care | Payer: Medicare Other | Source: Ambulatory Visit | Attending: General Surgery | Admitting: General Surgery

## 2019-07-07 ENCOUNTER — Inpatient Hospital Stay: Payer: Medicare Other

## 2019-07-07 ENCOUNTER — Encounter: Payer: Self-pay | Admitting: Radiation Oncology

## 2019-07-07 VITALS — BP 127/75 | HR 88 | Temp 98.5°F | Resp 18 | Ht 65.5 in | Wt 101.8 lb

## 2019-07-07 VITALS — BP 102/78 | HR 96 | Temp 98.5°F | Resp 18 | Ht 65.5 in | Wt 102.4 lb

## 2019-07-07 VITALS — BP 107/65 | HR 79 | Resp 16 | Ht 65.5 in | Wt 102.6 lb

## 2019-07-07 DIAGNOSIS — Z803 Family history of malignant neoplasm of breast: Secondary | ICD-10-CM

## 2019-07-07 DIAGNOSIS — M069 Rheumatoid arthritis, unspecified: Secondary | ICD-10-CM | POA: Diagnosis not present

## 2019-07-07 DIAGNOSIS — F17200 Nicotine dependence, unspecified, uncomplicated: Secondary | ICD-10-CM

## 2019-07-07 DIAGNOSIS — M19211 Secondary osteoarthritis, right shoulder: Secondary | ICD-10-CM | POA: Diagnosis not present

## 2019-07-07 DIAGNOSIS — M24522 Contracture, left elbow: Secondary | ICD-10-CM | POA: Diagnosis not present

## 2019-07-07 DIAGNOSIS — F329 Major depressive disorder, single episode, unspecified: Secondary | ICD-10-CM

## 2019-07-07 DIAGNOSIS — Z590 Homelessness: Secondary | ICD-10-CM | POA: Diagnosis not present

## 2019-07-07 DIAGNOSIS — C50919 Malignant neoplasm of unspecified site of unspecified female breast: Secondary | ICD-10-CM

## 2019-07-07 DIAGNOSIS — D0512 Intraductal carcinoma in situ of left breast: Secondary | ICD-10-CM

## 2019-07-07 DIAGNOSIS — Z170 Estrogen receptor positive status [ER+]: Secondary | ICD-10-CM

## 2019-07-07 DIAGNOSIS — R9389 Abnormal findings on diagnostic imaging of other specified body structures: Secondary | ICD-10-CM

## 2019-07-07 DIAGNOSIS — Z79899 Other long term (current) drug therapy: Secondary | ICD-10-CM | POA: Diagnosis not present

## 2019-07-07 DIAGNOSIS — F17210 Nicotine dependence, cigarettes, uncomplicated: Secondary | ICD-10-CM

## 2019-07-07 DIAGNOSIS — Z96642 Presence of left artificial hip joint: Secondary | ICD-10-CM

## 2019-07-07 DIAGNOSIS — M19212 Secondary osteoarthritis, left shoulder: Secondary | ICD-10-CM

## 2019-07-07 DIAGNOSIS — F419 Anxiety disorder, unspecified: Secondary | ICD-10-CM

## 2019-07-07 DIAGNOSIS — M0579 Rheumatoid arthritis with rheumatoid factor of multiple sites without organ or systems involvement: Secondary | ICD-10-CM

## 2019-07-07 DIAGNOSIS — M05741 Rheumatoid arthritis with rheumatoid factor of right hand without organ or systems involvement: Secondary | ICD-10-CM

## 2019-07-07 LAB — CBC WITH DIFFERENTIAL (CANCER CENTER ONLY)
Abs Immature Granulocytes: 0.01 10*3/uL (ref 0.00–0.07)
Basophils Absolute: 0 10*3/uL (ref 0.0–0.1)
Basophils Relative: 1 %
Eosinophils Absolute: 0.1 10*3/uL (ref 0.0–0.5)
Eosinophils Relative: 2 %
HCT: 42.5 % (ref 36.0–46.0)
Hemoglobin: 14 g/dL (ref 12.0–15.0)
Immature Granulocytes: 0 %
Lymphocytes Relative: 26 %
Lymphs Abs: 1.6 10*3/uL (ref 0.7–4.0)
MCH: 29 pg (ref 26.0–34.0)
MCHC: 32.9 g/dL (ref 30.0–36.0)
MCV: 88.2 fL (ref 80.0–100.0)
Monocytes Absolute: 0.5 10*3/uL (ref 0.1–1.0)
Monocytes Relative: 8 %
Neutro Abs: 3.9 10*3/uL (ref 1.7–7.7)
Neutrophils Relative %: 63 %
Platelet Count: 211 10*3/uL (ref 150–400)
RBC: 4.82 MIL/uL (ref 3.87–5.11)
RDW: 14.2 % (ref 11.5–15.5)
WBC Count: 6.1 10*3/uL (ref 4.0–10.5)
nRBC: 0 % (ref 0.0–0.2)

## 2019-07-07 LAB — CMP (CANCER CENTER ONLY)
ALT: 27 U/L (ref 0–44)
AST: 34 U/L (ref 15–41)
Albumin: 4 g/dL (ref 3.5–5.0)
Alkaline Phosphatase: 95 U/L (ref 38–126)
Anion gap: 11 (ref 5–15)
BUN: 14 mg/dL (ref 6–20)
CO2: 26 mmol/L (ref 22–32)
Calcium: 9.4 mg/dL (ref 8.9–10.3)
Chloride: 104 mmol/L (ref 98–111)
Creatinine: 0.96 mg/dL (ref 0.44–1.00)
GFR, Est AFR Am: >60 mL/min (ref >60)
GFR, Estimated: >60 mL/min (ref >60)
Glucose, Bld: 91 mg/dL (ref 70–99)
Potassium: 4.1 mmol/L (ref 3.5–5.1)
Sodium: 141 mmol/L (ref 135–145)
Total Bilirubin: 0.8 mg/dL (ref 0.3–1.2)
Total Protein: 7.4 g/dL (ref 6.5–8.1)

## 2019-07-07 MED ORDER — GADOBUTROL 1 MMOL/ML IV SOLN
5.0000 mL | Freq: Once | INTRAVENOUS | Status: AC | PRN
  Administered 2019-07-07: 5 mL via INTRAVENOUS

## 2019-07-07 NOTE — Patient Instructions (Signed)
Standing Labs We placed an order today for your standing lab work.    Please come back and get your standing labs in April  We have open lab daily Monday through Thursday from 8:30-12:30 PM and 1:30-4:30 PM and Friday from 8:30-12:30 PM and 1:30-4:00 PM at the office of Dr. Bo Merino.   You may experience shorter wait times on Monday and Friday afternoons. The office is located at [address] [place], [place], [place] [address] No appointment is necessary.   Labs are drawn by [facility].  You may receive a bill from [facility] for your lab work.  If you wish to have your labs drawn at another location, please call the office 24 hours in advance to send orders.  If you have any questions regarding directions or hours of operation,  please call [phone].   Just as a reminder please drink plenty of water prior to coming for your lab work. Thanks!

## 2019-07-07 NOTE — Progress Notes (Signed)
Office Visit Note  Patient: [name]             Date of Birth: 1960/08/18           MRN: [id number]             PCP: Ladell Pier, MD Referring: Ladell Pier, MD Visit Date: 07/07/2019 Occupation: _0 @  Subjective:  Medication monitoring  History of Present Illness: [name] is a 59 y.o. female with history of seropositive rheumatoid arthritis and osteoarthritis.  Patient is on Otrexup 20 mg IV as injections once weekly, folic acid 2 mg a mouth daily, and Enbrel 50 mg subcutaneous injections once weekly.  She was initiated on Enbrel on 05/10/2019.  She has noticed a significant improvement since starting on Enbrel.  She is able to make a complete fist bilaterally which she has been unable to do for the past 2 years.  She continues to have chronic pain and stiffness in both shoulder joints.  She denies any other joint pain or joint swelling at this time. She states that she was recently diagnosed with breast cancer and will be scheduled for a lumpectomy and radiation.  Activities of Daily Living:  Patient reports morning stiffness for 1 hour.   Patient Reports nocturnal pain.  Difficulty dressing/grooming: Denies Difficulty climbing stairs: Reports Difficulty getting out of chair: Denies Difficulty using hands for taps, buttons, cutlery, and/or writing: Reports  Review of Systems  Constitutional: Positive for fatigue.  HENT: Negative for mouth sores, mouth dryness and nose dryness.   Eyes: Negative for pain, visual disturbance and dryness.  Respiratory: Positive for cough. Negative for hemoptysis, shortness of breath and difficulty breathing.   Cardiovascular: Negative for chest pain, palpitations, hypertension and swelling in legs/feet.  Gastrointestinal: Positive for diarrhea. Negative for blood in stool and constipation.  Endocrine: Negative for increased urination.  Genitourinary: Negative for painful urination.  Musculoskeletal: Positive for arthralgias,  joint pain and morning stiffness. Negative for joint swelling, myalgias, muscle weakness, muscle tenderness and myalgias.  Skin: Negative for color change, pallor, rash, hair loss, nodules/bumps, skin tightness, ulcers and sensitivity to sunlight.  Allergic/Immunologic: Negative for susceptible to infections.  Neurological: Negative for dizziness, numbness, headaches and weakness.  Hematological: Negative for swollen glands.  Psychiatric/Behavioral: Negative for depressed mood and sleep disturbance. The patient is not nervous/anxious.     PMFS History:  Patient Active Problem List   Diagnosis Date Noted  . 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 Adenomatous colon polyp 03/16/2019  . Degenerative joint disease of shoulder region 06/08/2018  . Rheumatoid arthritis involving both hands with positive rheumatoid factor ([place]) 06/08/2018  . Rheumatoid arthritis involving both feet with positive rheumatoid factor ([place]) 06/08/2018  . 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 Status post total hip replacement, 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  . Smoker 06/08/2018  . Anxiety and depression 06/08/2018  . Family history of breast cancer 03/25/2018  . 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 Rheumatoid arthritis involving multiple sites ([place]) 01/15/2018  . Tobacco dependence 01/15/2018  . Opioid use agreement exists 01/15/2018  . Positive depression screening 01/15/2018  . Homeless 01/15/2018    Past Medical History:  Diagnosis Date  . Anxiety   . Bronchitis age 57  . Cancer ([place])   . Depression   . Hx of bladder infections    been over 5 years since last one   . Rheumatoid arthritis ([place])    unc rheum   . Tobacco abuse   .  Trouble in sleeping     Family History  Problem Relation Age of Onset  . Breast cancer Mother   . Cervical cancer Mother   . Cancer Brother   . Healthy Son    Past Surgical History:  Procedure Laterality Date  . BREAST BIOPSY    . COLONOSCOPY WITH PROPOFOL N/A  03/15/2019   Procedure: COLONOSCOPY WITH PROPOFOL;  Surgeon: Jonathon Bellows, MD;  Location: [facility] ENDOSCOPY;  Service: Gastroenterology;  Laterality: N/A;  . HIP ARTHROPLASTY    . JOINT REPLACEMENT    . TOTAL HIP ARTHROPLASTY Left 02/15/2018   Procedure: TOTAL HIP ARTHROPLASTY ANTERIOR APPROACH;  Surgeon: [name], MD;  Location: [facility] ORS;  Service: Orthopedics;  Laterality: Left;  . WISDOM TOOTH EXTRACTION     Social History   Social History Narrative  . Not on file   Immunization History  Administered Date(s) Administered  . PFIZER SARS-COV-2 Vaccination 06/27/2019  . Pneumococcal Conjugate-13 07/14/2018  . Tdap 03/25/2018  . Zoster Recombinat (Shingrix) 07/14/2018     Objective: Vital Signs: BP 107/65 (BP Location: Left Arm, Patient Position: Sitting, Cuff Size: Normal)   Pulse 79   Resp 16   Ht 5' 5.5" (1.664 m)   Wt 102 lb 9.6 oz (46.5 kg)   BMI 16.81 kg/m    Physical Exam Vitals and nursing note reviewed.  Constitutional:      Appearance: She is well-developed.  HENT:     Head: Normocephalic and atraumatic.  Eyes:     Conjunctiva/sclera: Conjunctivae normal.  Pulmonary:     Effort: Pulmonary effort is normal.  Abdominal:     General: Bowel sounds are normal.     Palpations: Abdomen is soft.  Musculoskeletal:     Cervical back: Normal range of motion.  Lymphadenopathy:     Cervical: No cervical adenopathy.  Skin:    General: Skin is warm and dry.     Capillary Refill: Capillary refill takes less than 2 seconds.  Neurological:     Mental Status: She is alert and oriented to person, place, and time.  Psychiatric:        Behavior: Behavior normal.      Musculoskeletal Exam: C-spine, thoracic spine, lumbar spine good range of motion.  Shoulder joints have limited abduction to about 90 degrees bilaterally.  Elbow joints have good range of motion no tenderness or inflammation.  She is limited range of motion of both wrist joints.  She has tenderness and synovitis on  the ulnar aspect of the right wrist.  MCPs, PIPs, DIPs have good range of motion with no synovitis.  She has complete fist formation bilaterally.  Hip joints have good range of motion with no discomfort at this time.  Knee joints have good range of motion no warmth or effusion.  Ankle joints have good range of motion no tenderness or inflammation.  No tenderness of MTP joints.  CDAI Exam: CDAI Score: 3.2  Patient Global: 6 mm; Provider Global: 6 mm Swollen: 0 ; Tender: 2  Joint Exam 07/07/2019      Right  Left  Glenohumeral   Tender   Tender     Investigation: No additional findings.  Imaging: MR BREAST BILATERAL W WO CONTRAST INC CAD  Result Date: 06/27/2019 CLINICAL DATA:  59 year old female with newly diagnosed DCIS of the upper outer left breast. LABS:  None performed on site. EXAM: BILATERAL BREAST MRI WITH AND WITHOUT CONTRAST TECHNIQUE: Multiplanar, multisequence MR images of both breasts were obtained prior to and following  the intravenous administration of 5 ml of Gadavist. Three-dimensional MR images were rendered by post-processing of the original MR data on an independent workstation. The three-dimensional MR images were interpreted, and findings are reported in the following complete MRI report for this study. Three dimensional images were evaluated at the independent DynaCad workstation COMPARISON:  Previous exam(s). FINDINGS: Breast composition: d. Extreme fibroglandular tissue. Background parenchymal enhancement: Mild. Right breast: No suspicious mass or abnormal enhancement. Left breast: Susceptibility artifact from post biopsy clip and associated post biopsy changes are seen in the posterior upper outer left breast (series 8, image 58/144). This is consistent with the patient's biopsy-proven site of DCIS. Superior and slightly medial to the biopsy site, there is an irregular, enhancing mass in the superior aspect at middle depth (series 8, image 48/144). It measures 8 x 7 x 8 mm.  This mass is located approximately 2.5 cm from the post biopsy clip. Lymph nodes: No abnormal appearing lymph nodes. Please note, the bilateral axilla are not well included within the field of view. Ancillary findings:  None. IMPRESSION: 1. Post-biopsy changes in the upper outer left breast at posterior depth at the site of biopsy-proven site of DCIS. 2. Suspicious 8 mm enhancing mass located approximately 2.5 cm superior and medial to the biopsy site. This may represent an additional site of invasive disease. Recommendation is to proceed directly with MRI guided biopsy. This area is felt unlikely to be seen by second-look ultrasound given the size and patient's breast density. 3. No MRI evidence of malignancy on the right. 4. No suspicious lymphadenopathy within the limits of this study. Please note, the bilateral axillary are not well included within the field of view. RECOMMENDATION: 1. MRI guided biopsy of the left breast. 2. If above biopsy demonstrates an additional site of invasive disease, ultrasound evaluation of the left axilla should be performed as this is not fully included on today's field of view. BI-RADS CATEGORY  4: Suspicious. Electronically Signed   By: Kristopher Oppenheim M.D.   On: 06/27/2019 08:04   MM CLIP PLACEMENT LEFT  Result Date: 06/13/2019 CLINICAL DATA:  Status post stereotactic core needle biopsy of left breast calcifications. Evaluate biopsy marker clip placement. EXAM: DIAGNOSTIC LEFT MAMMOGRAM POST STEREOTACTIC BIOPSY COMPARISON:  Previous exam(s). FINDINGS: Mammographic images were obtained following stereotactic guided biopsy of left breast calcifications. The biopsy marking clip is in expected position at the site of biopsy. IMPRESSION: Appropriate positioning of the coil shaped biopsy marking clip at the site of biopsy in the upper outer quadrant, close to 12 o'clock, adjacent to residual calcifications. Final Assessment: Post Procedure Mammograms for Marker Placement  Electronically Signed   By: Lajean Manes M.D.   On: 06/13/2019 12:42   MM LT BREAST BX W LOC DEV 1ST LESION IMAGE BX SPEC STEREO GUIDE  Addendum Date: 06/15/2019   ADDENDUM REPORT: 06/15/2019 10:56 ADDENDUM: Pathology revealed INTERMEDIATE GRADE DUCTAL CARCINOMA IN SITU WITH CALCIFICATIONS of the LEFT breast, upper outer quadrant. This was found to be concordant by Dr. Lajean Manes. Pathology results were discussed with the patient by telephone. The patient reported doing well after the biopsy with tenderness at the site. Post biopsy instructions and care were reviewed and questions were answered. The patient was encouraged to call [facility] for any additional concerns. Surgical consultation has been arranged with Dr. Autumn Messing at [facility] on June 21, 2019. Consider MRI and/or additional stereotactic biopsy of calcifications in the upper LEFT breast to determine  extent of disease. Pathology results reported by Stacie Acres RN on 06/15/2019. Electronically Signed   By: Lajean Manes M.D.   On: 06/15/2019 10:56   Result Date: 06/15/2019 CLINICAL DATA:  Patient presents for stereotactic core needle biopsy of left breast calcifications. EXAM: LEFT BREAST STEREOTACTIC CORE NEEDLE BIOPSY COMPARISON:  Previous exams. FINDINGS: The patient and I discussed the procedure of stereotactic-guided biopsy including benefits and alternatives. We discussed the high likelihood of a successful procedure. We discussed the risks of the procedure including infection, bleeding, tissue injury, clip migration, and inadequate sampling. Informed written consent was given. The usual time out protocol was performed immediately prior to the procedure. Using sterile technique and 1% Lidocaine as local anesthetic, under stereotactic guidance, a 9 gauge vacuum assisted device was used to perform core needle biopsy of calcifications in the upper outer quadrant of the left breast using a lateral  approach. Specimen radiograph was performed showing calcifications for which biopsy was performed. Specimens with calcifications are identified for pathology. Lesion quadrant: Upper outer quadrant At the conclusion of the procedure, coil shaped tissue marker clip was deployed into the biopsy cavity. Follow-up 2-view mammogram was performed and dictated separately. IMPRESSION: Stereotactic-guided biopsy of left breast calcifications. No apparent complications. Electronically Signed: By: Lajean Manes M.D. On: 06/13/2019 12:41   MR LT BREAST BX W LOC DEV 1ST LESION IMAGE BX SPEC MR GUIDE  Result Date: 07/07/2019 CLINICAL DATA:  59 year old female with an enhancing mass in the upper central left breast. EXAM: MRI GUIDED CORE NEEDLE BIOPSY OF THE LEFT BREAST TECHNIQUE: Multiplanar, multisequence MR imaging of the left breast was performed both before and after administration of intravenous contrast. CONTRAST:  60m GADAVIST GADOBUTROL 1 MMOL/ML IV SOLN COMPARISON:  Previous exams. FINDINGS: I met with the patient, and we discussed the procedure of MRI guided biopsy, including risks, benefits, and alternatives. Specifically, we discussed the risks of infection, bleeding, tissue injury, clip migration, and inadequate sampling. Informed, written consent was given. The usual time out protocol was performed immediately prior to the procedure. Using sterile technique, 1% Lidocaine, MRI guidance, and a 9 gauge vacuum assisted device, biopsy was performed of the enhancing mass in the upper central left breast using a lateral to medial approach. At the conclusion of the procedure, a a dumbbell shaped tissue marker clip was deployed into the biopsy cavity. Follow-up 2-view mammogram was performed and dictated separately. IMPRESSION: MRI guided biopsy of the enhancing mass in the upper central left breast. No apparent complications. Electronically Signed   By: JEverlean AlstromM.D.   On: 07/07/2019 10:10    Recent Labs: Lab  Results  Component Value Date   WBC 4.8 05/03/2019   HGB 14.6 05/03/2019   PLT 236 05/03/2019   NA 139 05/03/2019   K 4.2 05/03/2019   CL 104 05/03/2019   CO2 25 05/03/2019   GLUCOSE 101 (H) 05/03/2019   BUN 15 05/03/2019   CREATININE 0.96 05/03/2019   BILITOT 0.3 05/03/2019   ALKPHOS 87 01/15/2018   AST 11 05/03/2019   ALT 8 05/03/2019   PROT 6.2 05/03/2019   ALBUMIN 4.3 01/15/2018   CALCIUM 9.2 05/03/2019   GFRAA 75 05/03/2019   QFTBGOLDPLUS NEGATIVE 05/03/2019    Speciality Comments: No specialty comments available.  Procedures:  No procedures performed Allergies: Patient has no known allergies.   Assessment / Plan:     Visit Diagnoses: Rheumatoid arthritis involving multiple sites with positive rheumatoid factor ([place]: She has tenderness and synovitis on the  ulnar aspect of the right wrist joint.  She continues to have chronic pain and stiffness in both shoulder joints.  She has abduction to about 90 degrees bilaterally.  She has noticed an improvement in the discomfort in both hands since starting on Enbrel 50 mg subcutaneous injections on 05/10/2019.  She continues to inject Otrexup 20 mg subcutaneous only once weekly and takes folic acid 2 mg by mouth daily.  She takes prednisone 5 mg half tablet to 1 tablet by mouth as needed.  She has not needed to take prednisone recently.  She was recently diagnosed with ductal carcinoma in situ of left breast in April 2021.  She will be scheduling lumpectomy and 4 weeks of radiation following surgery.  She was advised to hold otrexup during radiation therapy.  She was asked to notify us if she develops increased joint pain or joint swelling.  She will follow-up in the office in 3 months.   High risk medication use: Enbrel 50 mg subcutaneous injections once weekly (started on 05/10/2019), Otrexup 20 mg subcutaneous injections once weekly, and folic acid 2 mg by mouth daily.  CBC and CMP were drawn on 05/03/2019.  TB Gold negative on 05/03/2019.   She is due to update CBC and CMP today since she was started on Enbrel on 05/10/2019.  She will return Monday for lab work. Standing orders are in place. She will return for lab work every 3 months to monitor for drug toxicity.  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  Other secondary osteoarthritis of both shoulders: Chronic pain.  She has limited abduction to about 90 degrees bilaterally.  Tenderness of both shoulder joints noted.  She is not ready to proceed with surgery at this time.  We discussed referral to physical therapy but she declined at this time due to upcoming lumpectomy and radiation therapy for breast cancer.  She was given a handout of shoulder joint exercises to perform.  Status post total hip replacement, left: Doing well.  She has good range of motion with no discomfort on exam today.  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  According to the patient she will be scheduled for lumpectomy and radiation therapy for 4 weeks.  She was advised to hold methotrexate during radiation therapy.  Other medical conditions are listed as follows:  Smoker  Anxiety and depression  Family history of breast cancer  Orders: No orders of the defined types were placed in this encounter.  No orders of the defined types were placed in this encounter.   Face-to-face time spent with patient was 30 minutes. Greater than 50% of time was spent in counseling and coordination of care.  Follow-Up Instructions: Return in about 3 months (around 10/06/2019) for Rheumatoid arthritis, Osteoarthritis.   Ofilia Neas, PA-C  Note - This record has been created using Dragon software.  Chart creation errors have been sought, but may not always  have been located. Such creation errors do not reflect on  the standard of medical care.

## 2019-07-07 NOTE — Telephone Encounter (Signed)
Samples of this drug Enbrel Mini 50 mg/ml were given to the patient, quantity 1, Lot Number [id number] Exp. 10/2020

## 2019-07-08 ENCOUNTER — Other Ambulatory Visit: Payer: Self-pay | Admitting: General Surgery

## 2019-07-08 ENCOUNTER — Other Ambulatory Visit: Payer: Self-pay | Admitting: Oncology

## 2019-07-08 DIAGNOSIS — R9389 Abnormal findings on diagnostic imaging of other specified body structures: Secondary | ICD-10-CM

## 2019-07-11 ENCOUNTER — Telehealth: Payer: Self-pay | Admitting: Oncology

## 2019-07-11 NOTE — Telephone Encounter (Signed)
Scheduled appts per 4/29 los. Left voicemail with appt date and time.

## 2019-07-12 ENCOUNTER — Encounter: Payer: Self-pay | Admitting: *Deleted

## 2019-07-13 ENCOUNTER — Telehealth: Payer: Self-pay | Admitting: *Deleted

## 2019-07-13 NOTE — Telephone Encounter (Signed)
This RN returned VM left by the pt with no issue stated - obtained VM- message left by this RN that her call was being returned- and for her to call again.

## 2019-07-14 ENCOUNTER — Other Ambulatory Visit: Payer: Self-pay | Admitting: General Surgery

## 2019-07-14 DIAGNOSIS — R921 Mammographic calcification found on diagnostic imaging of breast: Secondary | ICD-10-CM

## 2019-07-14 MED FILL — ENBREL MINI 50 MG/ML SOCT: 50 | 28 days supply | Qty: 4 | Fill #2

## 2019-07-18 ENCOUNTER — Ambulatory Visit: Payer: Medicare Other

## 2019-07-19 ENCOUNTER — Ambulatory Visit: Payer: Medicare Other | Attending: Internal Medicine

## 2019-07-19 DIAGNOSIS — Z23 Encounter for immunization: Secondary | ICD-10-CM

## 2019-07-19 NOTE — Progress Notes (Signed)
   Covid-19 Vaccination Clinic  Name:  [name]    MRN: [id number] DOB: 12-Aug-1960  07/19/2019  [name] was observed post Covid-19 immunization for 15 minutes without incident. She was provided with Vaccine Information Sheet and instruction to access the V-Safe system.   [name] was instructed to call 911 with any severe reactions post vaccine: Marland Kitchen Difficulty breathing  . Swelling of face and throat  . A fast heartbeat  . A bad rash all over body  . Dizziness and weakness   Immunizations Administered    Name Date Dose VIS Date Route   Pfizer COVID-19 Vaccine 07/19/2019  1:16 PM 0.3 mL 05/04/2018 Intramuscular   Manufacturer: [place]   Lot: [id number]   [place]: [id number]

## 2019-07-22 ENCOUNTER — Ambulatory Visit
Admission: RE | Admit: 2019-07-22 | Discharge: 2019-07-22 | Disposition: A | Discharge status: Home / Self Care | Payer: Medicare Other | Source: Ambulatory Visit | Attending: General Surgery | Admitting: General Surgery

## 2019-07-22 ENCOUNTER — Other Ambulatory Visit: Payer: Self-pay | Admitting: General Surgery

## 2019-07-22 ENCOUNTER — Other Ambulatory Visit: Payer: Self-pay

## 2019-07-22 DIAGNOSIS — R921 Mammographic calcification found on diagnostic imaging of breast: Secondary | ICD-10-CM

## 2019-07-25 ENCOUNTER — Telehealth: Payer: Self-pay | Admitting: Rheumatology

## 2019-07-25 MED ORDER — OTREXUP 20 MG/0.4ML ~~LOC~~ SOAJ: 20.0000 mg | SUBCUTANEOUS | 0 refills | Status: DC

## 2019-07-25 MED FILL — OTREXUP 20 MG/0.4 ML AUTO-I: 20 | 84 days supply | Qty: 5 | Fill #0

## 2019-07-25 NOTE — Telephone Encounter (Signed)
Patient request refill on MTX sent to [facility].

## 2019-07-25 NOTE — Telephone Encounter (Addendum)
Last visit: 07/07/2019 Next Visit: 10/06/2019 Labs: 07/07/2019 cbc/cmp WNL  Current Dose per office note on 07/07/2019: Otrexup 20 mg subcutaneous injections once weekly  Okay to refill per Dr. Estanislado Pandy

## 2019-07-26 ENCOUNTER — Inpatient Hospital Stay: Admission: RE | Admit: 2019-07-26 | Payer: Medicare Other | Source: Ambulatory Visit

## 2019-07-26 ENCOUNTER — Other Ambulatory Visit: Payer: Medicare Other

## 2019-07-27 ENCOUNTER — Encounter: Payer: Self-pay | Admitting: *Deleted

## 2019-08-02 ENCOUNTER — Other Ambulatory Visit: Payer: Self-pay

## 2019-08-02 ENCOUNTER — Ambulatory Visit
Admission: RE | Admit: 2019-08-02 | Discharge: 2019-08-02 | Disposition: A | Discharge status: Home / Self Care | Payer: Medicare Other | Source: Ambulatory Visit | Attending: General Surgery | Admitting: General Surgery

## 2019-08-02 ENCOUNTER — Ambulatory Visit: Payer: Medicare Other

## 2019-08-02 ENCOUNTER — Other Ambulatory Visit: Payer: Self-pay | Admitting: General Surgery

## 2019-08-02 DIAGNOSIS — R9389 Abnormal findings on diagnostic imaging of other specified body structures: Secondary | ICD-10-CM

## 2019-08-02 MED ORDER — GADOBUTROL 1 MMOL/ML IV SOLN
5.0000 mL | Freq: Once | INTRAVENOUS | Status: AC | PRN
  Administered 2019-08-02: 5 mL via INTRAVENOUS

## 2019-08-03 ENCOUNTER — Encounter: Payer: Self-pay | Admitting: *Deleted

## 2019-08-05 ENCOUNTER — Ambulatory Visit: Payer: Self-pay | Admitting: General Surgery

## 2019-08-05 DIAGNOSIS — C50412 Malignant neoplasm of upper-outer quadrant of left female breast: Secondary | ICD-10-CM

## 2019-08-11 ENCOUNTER — Encounter: Payer: Self-pay | Admitting: *Deleted

## 2019-08-11 ENCOUNTER — Other Ambulatory Visit: Payer: Self-pay | Admitting: General Surgery

## 2019-08-11 DIAGNOSIS — Z170 Estrogen receptor positive status [ER+]: Secondary | ICD-10-CM

## 2019-08-16 ENCOUNTER — Other Ambulatory Visit: Payer: Self-pay | Admitting: Rheumatology

## 2019-08-16 DIAGNOSIS — M0579 Rheumatoid arthritis with rheumatoid factor of multiple sites without organ or systems involvement: Secondary | ICD-10-CM

## 2019-08-16 NOTE — Telephone Encounter (Signed)
Last Visit: 07/07/2019 Next visit: 10/06/2019 Labs: 07/07/2019 WNL TB Gold: 05/03/2019 Neg   Current Dose per office note on 07/07/2019: Enbrel 50 mg subcutaneous injections once weekly  Okay to refill per Dr. Estanislado Pandy

## 2019-08-25 MED FILL — ENBREL MINI 50 MG/ML SOCT: 50 | 28 days supply | Qty: 4 | Fill #0

## 2019-08-26 ENCOUNTER — Encounter (HOSPITAL_COMMUNITY)
Admission: RE | Admit: 2019-08-26 | Discharge: 2019-08-26 | Disposition: A | Discharge status: Home / Self Care | Payer: Medicare Other | Source: Ambulatory Visit | Attending: General Surgery | Admitting: General Surgery

## 2019-08-26 ENCOUNTER — Other Ambulatory Visit (HOSPITAL_COMMUNITY)
Admission: RE | Admit: 2019-08-26 | Discharge: 2019-08-26 | Disposition: A | Discharge status: Home / Self Care | Payer: Medicare Other | Source: Ambulatory Visit | Attending: General Surgery | Admitting: General Surgery

## 2019-08-26 ENCOUNTER — Encounter (HOSPITAL_COMMUNITY): Payer: Self-pay

## 2019-08-26 ENCOUNTER — Other Ambulatory Visit: Payer: Self-pay

## 2019-08-26 DIAGNOSIS — Z20822 Contact with and (suspected) exposure to covid-19: Secondary | ICD-10-CM | POA: Insufficient documentation

## 2019-08-26 DIAGNOSIS — Z01812 Encounter for preprocedural laboratory examination: Secondary | ICD-10-CM | POA: Insufficient documentation

## 2019-08-26 HISTORY — DX: Unspecified asthma, uncomplicated: J45.909

## 2019-08-26 HISTORY — DX: Chronic obstructive pulmonary disease, unspecified: J44.9

## 2019-08-26 HISTORY — DX: Malignant (primary) neoplasm, unspecified: C80.1

## 2019-08-26 HISTORY — DX: Anemia, unspecified: D64.9

## 2019-08-26 LAB — CBC
HCT: 49.3 % — ABNORMAL HIGH (ref 36.0–46.0)
Hemoglobin: 16.2 g/dL — ABNORMAL HIGH (ref 12.0–15.0)
MCH: 29.3 pg (ref 26.0–34.0)
MCHC: 32.9 g/dL (ref 30.0–36.0)
MCV: 89.3 fL (ref 80.0–100.0)
Platelets: 236 10*3/uL (ref 150–400)
RBC: 5.52 MIL/uL — ABNORMAL HIGH (ref 3.87–5.11)
RDW: 13 % (ref 11.5–15.5)
WBC: 7.4 10*3/uL (ref 4.0–10.5)
nRBC: 0 % (ref 0.0–0.2)

## 2019-08-26 LAB — SARS CORONAVIRUS 2 (TAT 6-24 HRS): SARS Coronavirus 2: NEGATIVE

## 2019-08-26 LAB — COMPREHENSIVE METABOLIC PANEL
ALT: 17 U/L (ref 0–44)
AST: 20 U/L (ref 15–41)
Albumin: 4 g/dL (ref 3.5–5.0)
Alkaline Phosphatase: 85 U/L (ref 38–126)
Anion gap: 9 (ref 5–15)
BUN: 17 mg/dL (ref 6–20)
CO2: 28 mmol/L (ref 22–32)
Calcium: 9.3 mg/dL (ref 8.9–10.3)
Chloride: 105 mmol/L (ref 98–111)
Creatinine, Ser: 0.81 mg/dL (ref 0.44–1.00)
GFR calc Af Amer: >60 mL/min (ref >60)
GFR calc non Af Amer: >60 mL/min (ref >60)
Glucose, Bld: 103 mg/dL — ABNORMAL HIGH (ref 70–99)
Potassium: 4.4 mmol/L (ref 3.5–5.1)
Sodium: 142 mmol/L (ref 135–145)
Total Bilirubin: 0.6 mg/dL (ref 0.3–1.2)
Total Protein: 7.3 g/dL (ref 6.5–8.1)

## 2019-08-26 LAB — SURGICAL PCR SCREEN
MRSA, PCR: NEGATIVE
Staphylococcus aureus: NEGATIVE

## 2019-08-26 NOTE — Pre-Procedure Instructions (Signed)
Your procedure is scheduled on Tuesday, June 22nd from 07:30 AM- 09:00 AM.  Report to [name] Main Entrance "A" at 05:30 A.M., and check in at the Admitting office.  Call this number if you have problems the morning of surgery:  [phone]  Call [phone] if you have any questions prior to your surgery date Monday-Friday 8am-4pm.    Remember:  Do not eat after midnight the night before your surgery.  You may drink clear liquids until 04:30 AM the morning of your surgery.   Clear liquids allowed are: Water, Non-Citrus Juices (without pulp), Carbonated Beverages, Clear Tea, Black Coffee Only, and Gatorade.    Take these medicines the morning of surgery: budesonide-formoterol (SYMBICORT) inhaler  IF NEEDED: albuterol (VENTOLIN HFA) inhaler  Please bring all inhalers with you the day of surgery.    As of today, STOP taking any Aspirin (unless otherwise instructed by your surgeon) and Aspirin containing products, Aleve, Naproxen, Ibuprofen, Motrin, Advil, Goody's, BC's, all herbal medications, fish oil, and all vitamins.         The Morning of Surgery:             Do not wear jewelry, make up, or nail polish.            Do not wear lotions, powders, perfumes, or deodorant.            Do not shave 48 hours prior to surgery.              Do not bring valuables to the hospital.            [facility] is not responsible for any belongings or valuables.  Do NOT Smoke (Tobacco/Vapping) or drink Alcohol 24 hours prior to your procedure.  If you use a CPAP at night, you may bring all equipment for your overnight stay.   Contacts, glasses, dentures or bridgework may not be worn into surgery.      For patients admitted to the hospital, discharge time will be determined by your treatment team.   Patients discharged the day of surgery will not be allowed to drive home, and someone needs to stay with them for 24 hours.    Special instructions:   [facility]- Preparing For  Surgery  Before surgery, you can play an important role. Because skin is not sterile, your skin needs to be as free of germs as possible. You can reduce the number of germs on your skin by washing with CHG (chlorahexidine gluconate) Soap before surgery.  CHG is an antiseptic cleaner which kills germs and bonds with the skin to continue killing germs even after washing.    Oral Hygiene is also important to reduce your risk of infection.  Remember - BRUSH YOUR TEETH THE MORNING OF SURGERY WITH YOUR REGULAR TOOTHPASTE  Please do not use if you have an allergy to CHG or antibacterial soaps. If your skin becomes reddened/irritated stop using the CHG.  Do not shave (including legs and underarms) for at least 48 hours prior to first CHG shower. It is OK to shave your face.  Please follow these instructions carefully.   1. Shower the NIGHT BEFORE SURGERY and the MORNING OF SURGERY with CHG Soap.   2. If you chose to wash your hair, wash your hair first as usual with your normal shampoo.  3. After you shampoo, rinse your hair and body thoroughly to remove the shampoo.  4. Use CHG as you would any other liquid soap.  You can apply CHG directly to the skin and wash gently with a scrungie or a clean washcloth.   5. Apply the CHG Soap to your body ONLY FROM THE NECK DOWN.  Do not use on open wounds or open sores. Avoid contact with your eyes, ears, mouth and genitals (private parts). Wash Face and genitals (private parts)  with your normal soap.   6. Wash thoroughly, paying special attention to the area where your surgery will be performed.  7. Thoroughly rinse your body with warm water from the neck down.  8. DO NOT shower/wash with your normal soap after using and rinsing off the CHG Soap.  9. Pat yourself dry with a CLEAN TOWEL.  10. Wear CLEAN PAJAMAS to bed the night before surgery, wear comfortable clothes the morning of surgery  11. Place CLEAN SHEETS on your bed the night of your first  shower and DO NOT SLEEP WITH PETS.   Day of Surgery: Shower with CHG Soap.   Do not apply any deodorants/lotions.  Please wear clean clothes to the hospital/surgery center.   Remember to brush your teeth WITH YOUR REGULAR TOOTHPASTE.   Please read over the following fact sheets that you were given.

## 2019-08-26 NOTE — Progress Notes (Signed)
PCP - Karle Plumber, MD Cardiologist - Denies Oncologist- Lurline Del, MD Rheumatologist- Bo Merino, MD  PPM/ICD - Denies  Chest x-ray - N/A EKG - N/A Stress Test - Denies ECHO - Denies Cardiac Cath - Denies  Sleep Study - Denies  Patient denies being diabetic.  Blood Thinner Instructions: N/A Aspirin Instructions: N/A  ERAS Protcol - Yes PRE-SURGERY Ensure or G2- Not ordered  COVID TEST- 08/26/19   Anesthesia review: Scheduled for seed implantation 08/29/19  Patient denies shortness of breath, fever, cough and chest pain at PAT appointment   All instructions explained to the patient, with a verbal understanding of the material. Patient agrees to go over the instructions while at home for a better understanding. Patient also instructed to self quarantine after being tested for COVID-19. The opportunity to ask questions was provided.

## 2019-08-29 ENCOUNTER — Ambulatory Visit
Admission: RE | Admit: 2019-08-29 | Discharge: 2019-08-29 | Disposition: A | Discharge status: Home / Self Care | Payer: Medicare Other | Source: Ambulatory Visit | Attending: General Surgery | Admitting: General Surgery

## 2019-08-29 ENCOUNTER — Other Ambulatory Visit: Payer: Self-pay

## 2019-08-29 ENCOUNTER — Encounter (HOSPITAL_COMMUNITY): Payer: Self-pay | Admitting: General Surgery

## 2019-08-29 DIAGNOSIS — Z170 Estrogen receptor positive status [ER+]: Secondary | ICD-10-CM

## 2019-08-29 NOTE — Anesthesia Preprocedure Evaluation (Addendum)
Anesthesia Evaluation  Patient identified by MRN, date of birth, ID band Patient awake    Reviewed: Allergy & Precautions, H&P , NPO status , Patient's Chart, lab work & pertinent test results  Airway Mallampati: II  TM Distance: >3 FB Neck ROM: Full    Dental no notable dental hx. (+) Teeth Intact, Dental Advisory Given   Pulmonary asthma , COPD,  COPD inhaler, Current Smoker and Patient abstained from smoking.,    Pulmonary exam normal breath sounds clear to auscultation       Cardiovascular Exercise Tolerance: Good negative cardio ROS   Rhythm:Regular Rate:Normal     Neuro/Psych Anxiety Depression negative neurological ROS     GI/Hepatic negative GI ROS, Neg liver ROS,   Endo/Other  negative endocrine ROS  Renal/GU negative Renal ROS  negative genitourinary   Musculoskeletal  (+) Arthritis , Rheumatoid disorders,    Abdominal   Peds  Hematology  (+) Blood dyscrasia, anemia ,   Anesthesia Other Findings   Reproductive/Obstetrics negative OB ROS                            Anesthesia Physical Anesthesia Plan  ASA: II  Anesthesia Plan: General   Post-op Pain Management:  Regional for Post-op pain   Induction: Intravenous  PONV Risk Score and Plan: 3 and Ondansetron, Dexamethasone and Midazolam  Airway Management Planned: LMA  Additional Equipment:   Intra-op Plan:   Post-operative Plan: Extubation in OR  Informed Consent: I have reviewed the patients History and Physical, chart, labs and discussed the procedure including the risks, benefits and alternatives for the proposed anesthesia with the patient or authorized representative who has indicated his/her understanding and acceptance.       Plan Discussed with: CRNA and Surgeon  Anesthesia Plan Comments:        Anesthesia Quick Evaluation

## 2019-08-30 ENCOUNTER — Other Ambulatory Visit: Payer: Self-pay

## 2019-08-30 ENCOUNTER — Encounter (HOSPITAL_COMMUNITY): Payer: Self-pay | Admitting: General Surgery

## 2019-08-30 ENCOUNTER — Ambulatory Visit (HOSPITAL_COMMUNITY): Payer: Medicare Other | Admitting: Emergency Medicine

## 2019-08-30 ENCOUNTER — Ambulatory Visit (HOSPITAL_COMMUNITY)
Admission: RE | Admit: 2019-08-30 | Discharge: 2019-08-30 | Disposition: A | Discharge status: Home / Self Care | Payer: Medicare Other | Attending: General Surgery | Admitting: General Surgery

## 2019-08-30 ENCOUNTER — Ambulatory Visit
Admission: RE | Admit: 2019-08-30 | Discharge: 2019-08-30 | Disposition: A | Discharge status: Home / Self Care | Payer: Medicare Other | Source: Ambulatory Visit | Attending: General Surgery | Admitting: General Surgery

## 2019-08-30 ENCOUNTER — Ambulatory Visit (HOSPITAL_COMMUNITY): Payer: Medicare Other | Admitting: Certified Registered Nurse Anesthetist

## 2019-08-30 ENCOUNTER — Encounter (HOSPITAL_COMMUNITY)
Admission: RE | Disposition: A | Discharge status: Home / Self Care | Payer: Self-pay | Source: Home / Self Care | Attending: General Surgery

## 2019-08-30 ENCOUNTER — Encounter (HOSPITAL_COMMUNITY)
Admission: RE | Admit: 2019-08-30 | Discharge: 2019-08-30 | Disposition: A | Discharge status: Home / Self Care | Payer: Medicare Other | Source: Ambulatory Visit | Attending: General Surgery | Admitting: General Surgery

## 2019-08-30 DIAGNOSIS — Z87891 Personal history of nicotine dependence: Secondary | ICD-10-CM | POA: Insufficient documentation

## 2019-08-30 DIAGNOSIS — Z79891 Long term (current) use of opiate analgesic: Secondary | ICD-10-CM | POA: Diagnosis not present

## 2019-08-30 DIAGNOSIS — Z7951 Long term (current) use of inhaled steroids: Secondary | ICD-10-CM | POA: Diagnosis not present

## 2019-08-30 DIAGNOSIS — Z803 Family history of malignant neoplasm of breast: Secondary | ICD-10-CM | POA: Diagnosis not present

## 2019-08-30 DIAGNOSIS — M1990 Unspecified osteoarthritis, unspecified site: Secondary | ICD-10-CM | POA: Diagnosis not present

## 2019-08-30 DIAGNOSIS — Z79899 Other long term (current) drug therapy: Secondary | ICD-10-CM | POA: Insufficient documentation

## 2019-08-30 DIAGNOSIS — C50412 Malignant neoplasm of upper-outer quadrant of left female breast: Secondary | ICD-10-CM

## 2019-08-30 DIAGNOSIS — Z170 Estrogen receptor positive status [ER+]: Secondary | ICD-10-CM | POA: Insufficient documentation

## 2019-08-30 DIAGNOSIS — J449 Chronic obstructive pulmonary disease, unspecified: Secondary | ICD-10-CM | POA: Insufficient documentation

## 2019-08-30 HISTORY — PX: BREAST LUMPECTOMY WITH RADIOACTIVE SEED AND SENTINEL LYMPH NODE BIOPSY: SHX6550

## 2019-08-30 SURGERY — BREAST LUMPECTOMY WITH RADIOACTIVE SEED AND SENTINEL LYMPH NODE BIOPSY
Anesthesia: General | Site: Breast | Laterality: Left

## 2019-08-30 MED ORDER — GABAPENTIN 300 MG PO CAPS
300.0000 mg | ORAL_CAPSULE | ORAL | Status: AC
  Administered 2019-08-30: 300 mg via ORAL
  Filled 2019-08-30: qty 1

## 2019-08-30 MED ORDER — FENTANYL CITRATE (PF) 250 MCG/5ML IJ SOLN
INTRAMUSCULAR | Status: DC | PRN
  Administered 2019-08-30 (×5): 25 ug via INTRAVENOUS

## 2019-08-30 MED ORDER — BUPIVACAINE LIPOSOME 1.3 % IJ SUSP
INTRAMUSCULAR | Status: DC | PRN
  Administered 2019-08-30: 10 mL

## 2019-08-30 MED ORDER — DEXAMETHASONE SODIUM PHOSPHATE 10 MG/ML IJ SOLN
INTRAMUSCULAR | Status: DC | PRN
Start: 2019-08-30 — End: 2019-08-30
  Administered 2019-08-30: 8 mg via INTRAVENOUS

## 2019-08-30 MED ORDER — TECHNETIUM TC 99M SULFUR COLLOID FILTERED
1.0000 | Freq: Once | INTRAVENOUS | Status: AC | PRN
  Administered 2019-08-30: 1 via INTRADERMAL

## 2019-08-30 MED ORDER — BUPIVACAINE-EPINEPHRINE (PF) 0.5% -1:200000 IJ SOLN
INTRAMUSCULAR | Status: DC | PRN
  Administered 2019-08-30: 20 mL

## 2019-08-30 MED ORDER — PHENYLEPHRINE 40 MCG/ML (10ML) SYRINGE FOR IV PUSH (FOR BLOOD PRESSURE SUPPORT)
PREFILLED_SYRINGE | INTRAVENOUS | Status: DC | PRN
  Administered 2019-08-30: 80 ug via INTRAVENOUS
  Administered 2019-08-30: 40 ug via INTRAVENOUS
  Administered 2019-08-30: 80 ug via INTRAVENOUS

## 2019-08-30 MED ORDER — HYDROMORPHONE HCL 1 MG/ML IJ SOLN: 0.2500 mg | INTRAMUSCULAR | Status: DC | PRN

## 2019-08-30 MED ORDER — LACTATED RINGERS IV SOLN: INTRAVENOUS | Status: DC | PRN

## 2019-08-30 MED ORDER — ORAL CARE MOUTH RINSE: 15.0000 mL | Freq: Once | OROMUCOSAL | Status: AC

## 2019-08-30 MED ORDER — CHLORHEXIDINE GLUCONATE 0.12 % MT SOLN
15.0000 mL | Freq: Once | OROMUCOSAL | Status: AC
  Administered 2019-08-30: 15 mL via OROMUCOSAL
  Filled 2019-08-30: qty 15

## 2019-08-30 MED ORDER — CEFAZOLIN SODIUM-DEXTROSE 2-4 GM/100ML-% IV SOLN
2.0000 g | INTRAVENOUS | Status: AC
  Administered 2019-08-30: 2 g via INTRAVENOUS
  Filled 2019-08-30: qty 100

## 2019-08-30 MED ORDER — LIDOCAINE 2% (20 MG/ML) 5 ML SYRINGE
INTRAMUSCULAR | Status: DC | PRN
  Administered 2019-08-30: 40 mg via INTRAVENOUS

## 2019-08-30 MED ORDER — BUPIVACAINE HCL (PF) 0.25 % IJ SOLN
INTRAMUSCULAR | Status: AC
  Filled 2019-08-30: qty 30

## 2019-08-30 MED ORDER — ONDANSETRON HCL 4 MG/2ML IJ SOLN
INTRAMUSCULAR | Status: DC | PRN
  Administered 2019-08-30: 4 mg via INTRAVENOUS

## 2019-08-30 MED ORDER — PHENYLEPHRINE 40 MCG/ML (10ML) SYRINGE FOR IV PUSH (FOR BLOOD PRESSURE SUPPORT)
PREFILLED_SYRINGE | INTRAVENOUS | Status: AC
  Filled 2019-08-30: qty 10

## 2019-08-30 MED ORDER — ACETAMINOPHEN 500 MG PO TABS
1000.0000 mg | ORAL_TABLET | ORAL | Status: AC
  Administered 2019-08-30: 1000 mg via ORAL
  Filled 2019-08-30: qty 2

## 2019-08-30 MED ORDER — SODIUM CHLORIDE (PF) 0.9 % IJ SOLN
INTRAMUSCULAR | Status: AC
  Filled 2019-08-30: qty 10

## 2019-08-30 MED ORDER — STERILE WATER FOR IRRIGATION IR SOLN
Status: DC | PRN
  Administered 2019-08-30: 1000 mL

## 2019-08-30 MED ORDER — MIDAZOLAM HCL 5 MG/5ML IJ SOLN
INTRAMUSCULAR | Status: DC | PRN
  Administered 2019-08-30 (×2): 1 mg via INTRAVENOUS

## 2019-08-30 MED ORDER — PROPOFOL 10 MG/ML IV BOLUS
INTRAVENOUS | Status: AC
  Filled 2019-08-30: qty 20

## 2019-08-30 MED ORDER — PROPOFOL 10 MG/ML IV BOLUS
INTRAVENOUS | Status: DC | PRN
  Administered 2019-08-30: 10 mg via INTRAVENOUS
  Administered 2019-08-30: 100 mg via INTRAVENOUS

## 2019-08-30 MED ORDER — CHLORHEXIDINE GLUCONATE CLOTH 2 % EX PADS: 6.0000 | MEDICATED_PAD | Freq: Once | CUTANEOUS | Status: DC

## 2019-08-30 MED ORDER — CELECOXIB 200 MG PO CAPS
200.0000 mg | ORAL_CAPSULE | ORAL | Status: AC
  Administered 2019-08-30: 200 mg via ORAL
  Filled 2019-08-30: qty 1

## 2019-08-30 MED ORDER — HYDROCODONE-ACETAMINOPHEN 5-325 MG PO TABS: 1.0000 | ORAL_TABLET | Freq: Four times a day (QID) | ORAL | 0 refills | Status: DC | PRN

## 2019-08-30 MED ORDER — 0.9 % SODIUM CHLORIDE (POUR BTL) OPTIME
TOPICAL | Status: DC | PRN
  Administered 2019-08-30: 1000 mL

## 2019-08-30 MED ORDER — BUPIVACAINE-EPINEPHRINE 0.25% -1:200000 IJ SOLN
INTRAMUSCULAR | Status: DC | PRN
  Administered 2019-08-30: 10 mL

## 2019-08-30 MED ORDER — LIDOCAINE 2% (20 MG/ML) 5 ML SYRINGE
INTRAMUSCULAR | Status: AC
  Filled 2019-08-30: qty 5

## 2019-08-30 MED ORDER — METHYLENE BLUE 0.5 % INJ SOLN
INTRAVENOUS | Status: AC
  Filled 2019-08-30: qty 10

## 2019-08-30 MED ORDER — MIDAZOLAM HCL 2 MG/2ML IJ SOLN
INTRAMUSCULAR | Status: AC
  Filled 2019-08-30: qty 2

## 2019-08-30 MED ORDER — FENTANYL CITRATE (PF) 250 MCG/5ML IJ SOLN
INTRAMUSCULAR | Status: AC
  Filled 2019-08-30: qty 5

## 2019-08-30 SURGICAL SUPPLY — 47 items
ADH SKN CLS APL DERMABOND .7 (GAUZE/BANDAGES/DRESSINGS) ×1
APPLIER CLIP 11 MED OPEN (CLIP) ×3
APPLIER CLIP 9.375 MED OPEN (MISCELLANEOUS) ×3
APR CLP MED 11 20 MLT OPN (CLIP) ×1
APR CLP MED 9.3 20 MLT OPN (MISCELLANEOUS) ×1
BINDER BREAST LRG (GAUZE/BANDAGES/DRESSINGS) ×2 IMPLANT
BLADE SURG 15 STRL LF DISP TIS (BLADE) IMPLANT
BLADE SURG 15 STRL SS (BLADE) ×3
CANISTER SUCT 3000ML PPV (MISCELLANEOUS) ×3 IMPLANT
CLIP APPLIE 11 MED OPEN (CLIP) IMPLANT
CLIP APPLIE 9.375 MED OPEN (MISCELLANEOUS) ×1 IMPLANT
CNTNR URN SCR LID CUP LEK RST (MISCELLANEOUS) ×1 IMPLANT
CONT SPEC 4OZ STRL OR WHT (MISCELLANEOUS) ×3
COVER PROBE W GEL 5X96 (DRAPES) ×3 IMPLANT
COVER SURGICAL LIGHT HANDLE (MISCELLANEOUS) ×1 IMPLANT
COVER WAND RF STERILE (DRAPES) IMPLANT
DERMABOND ADVANCED (GAUZE/BANDAGES/DRESSINGS) ×2
DERMABOND ADVANCED .7 DNX12 (GAUZE/BANDAGES/DRESSINGS) ×1 IMPLANT
DEVICE DUBIN SPECIMEN MAMMOGRA (MISCELLANEOUS) ×3 IMPLANT
DRAPE CHEST BREAST 15X10 FENES (DRAPES) ×3 IMPLANT
DRSG PAD ABDOMINAL 8X10 ST (GAUZE/BANDAGES/DRESSINGS) ×2 IMPLANT
DURAPREP 26ML APPLICATOR (WOUND CARE) ×2 IMPLANT
ELECT COATED BLADE 2.86 ST (ELECTRODE) ×3 IMPLANT
ELECT REM PT RETURN 9FT ADLT (ELECTROSURGICAL) ×3
ELECTRODE REM PT RTRN 9FT ADLT (ELECTROSURGICAL) ×1 IMPLANT
GAUZE SPONGE 4X4 12PLY STRL (GAUZE/BANDAGES/DRESSINGS) ×2 IMPLANT
GLOVE BIO SURGEON STRL SZ7.5 (GLOVE) ×6 IMPLANT
GLOVE SS BIOGEL STRL SZ 7 (GLOVE) IMPLANT
GLOVE SUPERSENSE BIOGEL SZ 7 (GLOVE) ×4
GOWN STRL REUS W/ TWL LRG LVL3 (GOWN DISPOSABLE) ×2 IMPLANT
GOWN STRL REUS W/TWL LRG LVL3 (GOWN DISPOSABLE) ×9
KIT BASIN OR (CUSTOM PROCEDURE TRAY) ×3 IMPLANT
KIT MARKER MARGIN INK (KITS) ×3 IMPLANT
LIGHT WAVEGUIDE WIDE FLAT (MISCELLANEOUS) IMPLANT
NDL 18GX1X1/2 (RX/OR ONLY) (NEEDLE) IMPLANT
NDL FILTER BLUNT 18X1 1/2 (NEEDLE) IMPLANT
NDL HYPO 25GX1X1/2 BEV (NEEDLE) ×1 IMPLANT
NEEDLE 18GX1X1/2 (RX/OR ONLY) (NEEDLE) IMPLANT
NEEDLE FILTER BLUNT 18X 1/2SAF (NEEDLE)
NEEDLE FILTER BLUNT 18X1 1/2 (NEEDLE) IMPLANT
NEEDLE HYPO 25GX1X1/2 BEV (NEEDLE) ×3 IMPLANT
NS IRRIG 1000ML POUR BTL (IV SOLUTION) ×3 IMPLANT
PACK GENERAL/GYN (CUSTOM PROCEDURE TRAY) ×3 IMPLANT
SUT MNCRL AB 4-0 PS2 18 (SUTURE) ×6 IMPLANT
SUT VIC AB 3-0 SH 18 (SUTURE) ×3 IMPLANT
SYR CONTROL 10ML LL (SYRINGE) ×3 IMPLANT
TOWEL GREEN STERILE (TOWEL DISPOSABLE) ×3 IMPLANT

## 2019-08-30 NOTE — H&P (Signed)
[name]  Location: [place] [facility] Patient #: [phone] DOB: 12/30/1960 Widowed / Language: Cleophus Molt / Race: White Female   History of Present Illness The patient is a 59 year old female who presents for a follow-up for Breast cancer. the patient is a 59 year old white female who we saw originally with a 9 mm area of DCIS in the upper outer quadrant of the left breast. She then underwent an MRI study which identified a second 8 mm area in the upper outer quadrant just medial to the first area. This was biopsied and came back as an invasive breast cancer that was ER and PR positive and HER-2 negative with a Ki-67 of 1%. The lymph nodes looked normal. She returns today to talk about options for surgery.   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  Diagnostic Studies History  Colonoscopy  within last year Mammogram  within last year  Allergies No Known Drug Allergies   Medication History Albuterol Sulfate HFA (108 (90 Base)MCG/ACT Aerosol Soln, Inhalation) Active. Symbicort (80-4.5MCG/ACT Aerosol, Inhalation) Active. Vitamin D (1000UNIT Tablet, Oral) Active. diazePAM (2MG Tablet, Oral) Active. Enbrel Mini (50MG/ML Soln Cartridge, Subcutaneous) Active. Methotrexate (PF) (20MG/0.4ML Soln Auto-inj, Subcutaneous) Active. traMADol HCl (50MG Tablet, Oral) Active. Medications Reconciled  Social History  Alcohol use  Occasional alcohol use. Caffeine use  Coffee. Illicit drug use  Recently quit drug use. Tobacco use  Former smoker.  Family History  Breast Cancer  Mother. Heart disease in female family member before age 30  Respiratory Condition  Brother.  Pregnancy / Birth History  Age at menarche  89 years. Age of menopause  [age] Contraceptive History  Oral contraceptives. Gravida   2 Length (months) of breastfeeding  3-6 Maternal age  2-20    Review of Systems  General Not Present- Appetite Loss, Chills, Fatigue, Fever, Night Sweats, Weight Gain and Weight Loss. Note: All other systems negative (unless as noted in HPI & included Review of Systems) Skin Not Present- Change in Wart/Mole, Dryness, Hives, Jaundice, New Lesions, Non-Healing Wounds, Rash and Ulcer. HEENT Not Present- Earache, Hearing Loss, Hoarseness, Nose Bleed, Oral Ulcers, Ringing in the Ears, Seasonal Allergies, Sinus Pain, Sore Throat, Visual Disturbances, Wears glasses/contact lenses and Yellow Eyes. Respiratory Not Present- Bloody sputum, Chronic Cough, Difficulty Breathing, Snoring and Wheezing. Breast Not Present- Breast Mass, Breast Pain, Nipple Discharge and Skin Changes. Cardiovascular Not Present- Chest Pain, Difficulty Breathing Lying Down, Leg Cramps, Palpitations, Rapid Heart Rate, Shortness of Breath and Swelling of Extremities. Gastrointestinal Not Present- Abdominal Pain, Bloating, Bloody Stool, Change in Bowel Habits, Chronic diarrhea, Constipation, Difficulty Swallowing, Excessive gas, Gets full quickly at meals, Hemorrhoids, Indigestion, Nausea, Rectal Pain and Vomiting. Female Genitourinary Not Present- Frequency, Nocturia, Painful Urination, Pelvic Pain and Urgency. Musculoskeletal Not Present- Back Pain, Joint Pain, Joint Stiffness, Muscle Pain, Muscle Weakness and Swelling of Extremities. Neurological Not Present- Decreased Memory, Fainting, Headaches, Numbness, Seizures, Tingling, Tremor, Trouble walking and Weakness. Psychiatric Not Present- Anxiety, Bipolar, Change in Sleep Pattern, Depression, Fearful and Frequent crying. Endocrine Not Present- Cold Intolerance, Excessive Hunger, Hair Changes, Heat Intolerance, Hot flashes and New Diabetes. Hematology Not Present- Easy Bruising, Excessive bleeding, Gland problems, HIV and Persistent Infections.  Vitals  Weight: 99.38 lb  Height: 65.5in Body Surface Area: 1.48 m Body Mass Index: 16.29 kg/m  Pulse: 96 (Regular)  Physical Exam General Mental Status-Alert. General Appearance-Consistent with stated age. Hydration-Well hydrated. Voice-Normal.  Head and Neck Head-normocephalic, atraumatic with no lesions or palpable masses. Trachea-midline. Thyroid Gland Characteristics - normal size and consistency.  Eye Eyeball - Bilateral-Extraocular movements intact. Sclera/Conjunctiva - Bilateral-No scleral icterus.  Chest and Lung Exam Chest and lung exam reveals -quiet, even and easy respiratory effort with no use of accessory muscles and on auscultation, normal breath sounds, no adventitious sounds and normal vocal resonance. Inspection Chest Wall - Normal. Back - normal.  Breast Note: The patient has a very small dense symmetrically nodular breast tissue bilaterally with no definite palpable mass. There is no palpable axillary, supraclavicular, or cervical lymphadenopathy   Cardiovascular Cardiovascular examination reveals -normal heart sounds, regular rate and rhythm with no murmurs and normal pedal pulses bilaterally.  Abdomen Inspection Inspection of the abdomen reveals - No Hernias. Skin - Scar - no surgical scars. Palpation/Percussion Palpation and Percussion of the abdomen reveal - Soft, Non Tender, No Rebound tenderness, No Rigidity (guarding) and No hepatosplenomegaly. Auscultation Auscultation of the abdomen reveals - Bowel sounds normal.  Neurologic Neurologic evaluation reveals -alert and oriented x 3 with no impairment of recent or remote memory. Mental Status-Normal.  Musculoskeletal Normal Exam - Left-Upper Extremity Strength Normal and Lower Extremity Strength Normal. Normal Exam - Right-Upper Extremity Strength Normal and Lower Extremity Strength Normal.  Lymphatic Head & Neck  General Head & Neck Lymphatics: Bilateral -  Description - Normal. Axillary  General Axillary Region: Bilateral - Description - Normal. Tenderness - Non Tender. Femoral & Inguinal  Generalized Femoral & Inguinal Lymphatics: Bilateral - Description - Normal. Tenderness - Non Tender.    Assessment & Plan MALIGNANT NEOPLASM OF UPPER-OUTER QUADRANT OF LEFT BREAST IN FEMALE, ESTROGEN RECEPTOR POSITIVE (C50.412) Impression: the patient recently had an MR guided biopsy of a second area in the upper outer quadrant on the left breast which came back as invasive breast cancer. The first area was ductal carcinoma in situ in the upper outer quadrant. Both measured 8-9 mm. The lymph nodes looked normal. I have discussed with her in detail the different options for treatment and at this point she favors breast conservation. She will be a good candidate for sentinel node biopsy. I have discussed with her in detail the risks and benefits of the operation as well as some of the technical aspects including the use of 2 radioactive seeds for localization and she understands and wishes to proceed. she also understands that there may be a slightly higher than average risk of having a positive margin that would need a subsequent surgery.This patient encounter took 20 minutes today to perform the following: take history, perform exam, review outside records, interpret imaging, counsel the patient on their diagnosis and document encounter, findings & plan in the EHR

## 2019-08-30 NOTE — Interval H&P Note (Signed)
History and Physical Interval Note:  08/30/2019 7:17 AM  [name]  has presented today for surgery, with the diagnosis of LEFT BREAST CANCER.  The various methods of treatment have been discussed with the patient and family. After consideration of risks, benefits and other options for treatment, the patient has consented to  Procedure(s) with comments: LEFT BREAST LUMPECTOMY X 2 WITH RADIOACTIVE SEED AND SENTINEL LYMPH NODE BIOPSY (Left) - PEC BLOCK as a surgical intervention.  The patient's history has been reviewed, patient examined, no change in status, stable for surgery.  I have reviewed the patient's chart and labs.  Questions were answered to the patient's satisfaction.     [name]

## 2019-08-30 NOTE — Transfer of Care (Signed)
Immediate Anesthesia Transfer of Care Note  Patient: [name]  Procedure(s) Performed: LEFT BREAST LUMPECTOMY X 2 WITH RADIOACTIVE SEED AND SENTINEL LYMPH NODE BIOPSY (Left Breast)  Patient Location: PACU  Anesthesia Type:General and Regional  Level of Consciousness: drowsy  Airway & Oxygen Therapy: Patient Spontanous Breathing, Patient connected to nasal cannula oxygen and OPA  Post-op Assessment: Report given to RN and Post -op Vital signs reviewed and stable  Post vital signs: Reviewed and stable  Last Vitals:  Vitals Value Taken Time  BP 121/62 08/30/19 0912  Temp    Pulse 73 08/30/19 0914  Resp 11 08/30/19 0914  SpO2 98 % 08/30/19 0914  Vitals shown include unvalidated device data.  Last Pain:  Vitals:   08/30/19 0645  TempSrc: Oral  PainSc:       Patients Stated Pain Goal: 3 (03/88/82 8003)  Complications: No complications documented.

## 2019-08-30 NOTE — Anesthesia Postprocedure Evaluation (Signed)
Anesthesia Post Note  Patient: [name]  Procedure(s) Performed: LEFT BREAST LUMPECTOMY X 2 WITH RADIOACTIVE SEED AND SENTINEL LYMPH NODE BIOPSY (Left Breast)     Patient location during evaluation: PACU Anesthesia Type: General and Regional Level of consciousness: awake and alert Pain management: pain level controlled Vital Signs Assessment: post-procedure vital signs reviewed and stable Respiratory status: spontaneous breathing, nonlabored ventilation and respiratory function stable Cardiovascular status: blood pressure returned to baseline and stable Postop Assessment: no apparent nausea or vomiting Anesthetic complications: no   No complications documented.  Last Vitals:  Vitals:   08/30/19 0915 08/30/19 0930  BP: 121/62 116/72  Pulse: 72 81  Resp: 12 16  Temp: (!) 36 C 36.6 C  SpO2: 97% 97%    Last Pain:  Vitals:   08/30/19 0915  TempSrc:   PainSc: Asleep                 Curran Lenderman,W. EDMOND

## 2019-08-30 NOTE — Anesthesia Procedure Notes (Signed)
Anesthesia Regional Block: Pectoralis block   Pre-Anesthetic Checklist: ,, timeout performed, Correct Patient, Correct Site, Correct Laterality, Correct Procedure, Correct Position, site marked, Risks and benefits discussed, pre-op evaluation,  At surgeon's request and post-op pain management  Laterality: Left  Prep: Maximum Sterile Barrier Precautions used, chloraprep       Needles:  Injection technique: Single-shot  Needle Type: Echogenic Stimulator Needle     Needle Length: 9cm  Needle Gauge: 21     Additional Needles:   Procedures:,,,, ultrasound used (permanent image in chart),,,,  Narrative:  Start time: 08/30/2019 7:05 AM End time: 08/30/2019 7:15 AM Injection made incrementally with aspirations every 5 mL. Anesthesiologist: Roderic Palau, MD  Additional Notes: 2% Lidocaine skin wheel.

## 2019-08-30 NOTE — Op Note (Signed)
08/30/2019  9:05 AM  PATIENT:  [name]  59 y.o. female  PRE-OPERATIVE DIAGNOSIS:  LEFT BREAST CANCER  POST-OPERATIVE DIAGNOSIS:  LEFT BREAST CANCER  PROCEDURE:  Procedure(s) with comments: LEFT BREAST LUMPECTOMY X 2 WITH RADIOACTIVE SEED LOCALIZATION AND DEEP LEFT AXILLARY SENTINEL LYMPH NODE BIOPSY (Left) - PEC BLOCK  SURGEON:  Surgeon(s) and Role:    * [name], MD - Primary  PHYSICIAN ASSISTANT:   ASSISTANTS: none   ANESTHESIA:   local and general  EBL:  20 mL   BLOOD ADMINISTERED:none  DRAINS: none   LOCAL MEDICATIONS USED:  MARCAINE     SPECIMEN:  Source of Specimen:  left breast tissue with additional medial and superior margins and sentinel node  DISPOSITION OF SPECIMEN:  PATHOLOGY  COUNTS:  YES  TOURNIQUET:  * No tourniquets in log *  DICTATION: .Dragon Dictation   After informed consent was obtained the patient was brought to the operating room and placed in the supine position on the operating table.  After adequate induction of general anesthesia the patient's left chest, breast, and axillary area were prepped with DuraPrep, allowed to dry, and draped in usual sterile manner.  An appropriate timeout was performed.  Previously to I-125 seeds were placed in the upper outer portion of the left breast to mark areas of breast cancer.  Also earlier in the day the patient underwent injection of 1 mCi of technetium sulfur colloid in the subareolar position on the left.  The neoprobe was initially set to technetium and area of radioactivity was readily identified in the left axilla.  The area overlying this was infiltrated with quarter percent Marcaine.  A small transversely oriented incision was made overlying the area of radioactivity with a 15 blade knife.  The incision was carried through the skin and subcutaneous tissue sharply with the electrocautery until the deep left axillary space was entered.  Blunt hemostat dissection was carried out under the direction  of the neoprobe.  I was able to identify a hot lymph node.  This was excised sharply with the electrocautery and the surrounding lymphatics and small vessels were controlled with clips.  Once this was removed it appeared as though there may be 3 lymph nodes within the specimen.  Ex vivo counts on this node were approximately 600.  No other hot or palpable nodes were identified in the left axilla.  Hemostasis was achieved using the Bovie electrocautery.  The deep layer of the wound was closed with interrupted 3-0 Vicryl stitches.  The skin was closed with a running 4-0 Monocryl subcuticular stitch.  Attention was then turned to the left breast.  The neoprobe was set to I-125 in the area of radioactivity was readily identified.  I elected to make a curvilinear incision with a 15 blade knife overlying the area of radioactivity.  The incision was carried through the skin and subcutaneous tissue sharply with the electrocautery.  Dissection was carried out throughout the upper outer quadrant of the breast between the breast tissue and the subcutaneous fat until we were well beyond the area of radioactivity.  I then removed a circular portion of breast tissue sharply with the electrocautery around the radioactive seeds while checking the area of radioactivity frequently.  The dissection was carried all the way to the muscle of the chest wall.  Once the specimen was removed it was oriented with the appropriate paint colors.  A specimen radiograph was obtained that showed the clips and seeds to be within  the specimen.  Because of the appearance I elected to take an additional medial and superior margin and these were marked appropriately and sent separately.  All of the tissue was then sent to pathology for further evaluation.  Hemostasis was achieved using the Bovie electrocautery.  The wound was irrigated with saline and the cavity was marked with clips.  The breast tissue was then reapproximated with interrupted 3-0  Vicryl stitches.  The skin was then closed with a running 4-0 Monocryl subcuticular stitch.  Dermabond dressings were applied as well as a breast binder.  The patient tolerated the procedure well.  At the end of the case all needle sponge and instrument counts were correct.  The patient was then awakened and taken to recovery in stable condition.  PLAN OF CARE: Discharge to home after PACU  PATIENT DISPOSITION:  PACU - hemodynamically stable.   Delay start of Pharmacological VTE agent (>24hrs) due to surgical blood loss or risk of bleeding: not applicable

## 2019-08-31 ENCOUNTER — Encounter (HOSPITAL_COMMUNITY): Payer: Self-pay | Admitting: General Surgery

## 2019-09-02 LAB — SURGICAL PATHOLOGY

## 2019-09-03 ENCOUNTER — Encounter: Payer: Self-pay | Admitting: Internal Medicine

## 2019-09-03 DIAGNOSIS — C50919 Malignant neoplasm of unspecified site of unspecified female breast: Secondary | ICD-10-CM | POA: Insufficient documentation

## 2019-09-05 ENCOUNTER — Other Ambulatory Visit: Payer: Self-pay | Admitting: *Deleted

## 2019-09-05 DIAGNOSIS — D0512 Intraductal carcinoma in situ of left breast: Secondary | ICD-10-CM

## 2019-09-07 ENCOUNTER — Encounter: Payer: Self-pay | Admitting: *Deleted

## 2019-09-07 ENCOUNTER — Telehealth: Payer: Self-pay | Admitting: Internal Medicine

## 2019-09-07 NOTE — Telephone Encounter (Signed)
Phone call placed to patient today.  I left message on her voicemail letting her know that I was calling to make sure she got the results of the biopsy of the left breast.  If she has not she can give me a call at the office and I will go over the results with her.  I am assuming that she knows the results as I do see she has an appointment with the oncologist August 3rd and there has been referral to radiation oncology.

## 2019-09-08 ENCOUNTER — Encounter: Payer: Self-pay | Admitting: *Deleted

## 2019-09-08 NOTE — Telephone Encounter (Addendum)
Pt called back this am.  Pt wants to know which biopsy are you referring to? And are you referring to the lymph nodes that were removed? Regardless, pt would appreciate a call back Dr Wynetta Emery.Marland Kitchen

## 2019-09-09 ENCOUNTER — Ambulatory Visit: Payer: Self-pay | Admitting: General Surgery

## 2019-09-13 NOTE — Telephone Encounter (Signed)
Phone call returned to patient 09/12/2019.  I told the patient that I received the pathology results of the biopsy/lumpectomy that she had done 08/30/2019 on her breast or lymph node.  It was cancerous and I just wanted to make sure that she was aware of it.  Patient states that she is aware and has already been plugged in with oncology services.  She thanked me for following up with her.

## 2019-09-15 ENCOUNTER — Telehealth: Payer: Self-pay | Admitting: *Deleted

## 2019-09-15 ENCOUNTER — Encounter: Payer: Self-pay | Admitting: *Deleted

## 2019-09-15 DIAGNOSIS — Z170 Estrogen receptor positive status [ER+]: Secondary | ICD-10-CM | POA: Insufficient documentation

## 2019-09-15 NOTE — Telephone Encounter (Signed)
Ordered oncotype per Dr. Magrinat.  Faxed requisition to pathology. 

## 2019-09-19 ENCOUNTER — Other Ambulatory Visit: Payer: Self-pay

## 2019-09-19 ENCOUNTER — Encounter (HOSPITAL_BASED_OUTPATIENT_CLINIC_OR_DEPARTMENT_OTHER): Payer: Self-pay | Admitting: General Surgery

## 2019-09-20 ENCOUNTER — Other Ambulatory Visit: Payer: Self-pay | Admitting: Internal Medicine

## 2019-09-20 DIAGNOSIS — J4540 Moderate persistent asthma, uncomplicated: Secondary | ICD-10-CM

## 2019-09-22 ENCOUNTER — Other Ambulatory Visit (HOSPITAL_COMMUNITY): Payer: Medicare Other

## 2019-09-23 ENCOUNTER — Telehealth: Payer: Self-pay | Admitting: *Deleted

## 2019-09-23 ENCOUNTER — Encounter: Payer: Self-pay | Admitting: *Deleted

## 2019-09-23 DIAGNOSIS — D0512 Intraductal carcinoma in situ of left breast: Secondary | ICD-10-CM

## 2019-09-23 NOTE — Progress Notes (Deleted)
Office Visit Note  Patient: [name]             Date of Birth: [birth year]           MRN: [id number]             PCP: Ladell Pier, MD Referring: Ladell Pier, MD Visit Date: 10/06/2019 Occupation: @GUAROCC @  Subjective:  No chief complaint on file.   History of Present Illness: [name] is a 59 y.o. female ***   Activities of Daily Living:  Patient reports morning stiffness for *** {minute/hour:19697}.   Patient {ACTIONS;DENIES/REPORTS:21021675::"Denies"} nocturnal pain.  Difficulty dressing/grooming: {ACTIONS;DENIES/REPORTS:21021675::"Denies"} Difficulty climbing stairs: {ACTIONS;DENIES/REPORTS:21021675::"Denies"} Difficulty getting out of chair: {ACTIONS;DENIES/REPORTS:21021675::"Denies"} Difficulty using hands for taps, buttons, cutlery, and/or writing: {ACTIONS;DENIES/REPORTS:21021675::"Denies"}  No Rheumatology ROS completed.   PMFS History:  Patient Active Problem List   Diagnosis Date Noted  . Malignant neoplasm of upper-outer quadrant of left breast in female, estrogen receptor positive ([place]) 09/15/2019  . Breast cancer ([place]) 09/03/2019  . 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 Adenomatous colon polyp 03/16/2019  . Degenerative joint disease of shoulder region 06/08/2018  . Rheumatoid arthritis involving both hands with positive rheumatoid factor ([place]) 06/08/2018  . Rheumatoid arthritis involving both feet with positive rheumatoid factor ([place]) 06/08/2018  . 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 Status post total hip replacement, 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  . Smoker 06/08/2018  . Anxiety and depression 06/08/2018  . Family history of breast cancer 03/25/2018  . 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 Rheumatoid arthritis involving multiple sites ([place]) 01/15/2018  . Tobacco dependence 01/15/2018  . Opioid use agreement exists 01/15/2018  . Positive depression screening 01/15/2018  . Homeless 01/15/2018    Past Medical History:    Diagnosis Date  . Anemia    during pregnancy  . Anxiety   . Asthma   . Bronchitis age 79  . Cancer ([place])    left breast ca  . COPD (chronic obstructive pulmonary disease) ([place])   . Depression   . Hx of bladder infections    been over 5 years since last one   . Rheumatoid arthritis ([place])    unc rheum   . Tobacco abuse   . Trouble in sleeping     Family History  Problem Relation Age of Onset  . Breast cancer Mother   . Cervical cancer Mother   . Cancer Brother   . Healthy Son    Past Surgical History:  Procedure Laterality Date  . BREAST LUMPECTOMY WITH RADIOACTIVE SEED AND SENTINEL LYMPH NODE BIOPSY Left 08/30/2019   Procedure: LEFT BREAST LUMPECTOMY X 2 WITH RADIOACTIVE SEED AND SENTINEL LYMPH NODE BIOPSY;  Surgeon: [name], MD;  Location: [place];  Service: General;  Laterality: Left;  PEC BLOCK  . COLONOSCOPY WITH PROPOFOL N/A 03/15/2019   Procedure: COLONOSCOPY WITH PROPOFOL;  Surgeon: Jonathon Bellows, MD;  Location: [facility] ENDOSCOPY;  Service: Gastroenterology;  Laterality: N/A;  . HIP ARTHROPLASTY    . JOINT REPLACEMENT    . TOTAL HIP ARTHROPLASTY Left 02/15/2018   Procedure: TOTAL HIP ARTHROPLASTY ANTERIOR APPROACH;  Surgeon: [name], MD;  Location: [facility] OR[facility];  Service: Orthopedics;  Laterality: Left;  . WISDOM TOOTH EXTRACTION     Social History   Social History Narrative  . Not on file   Immunization History  Administered Date(s) Administered  . PFIZER SARS-COV-2 Vaccination 06/27/2019, 07/19/2019  . Pneumococcal Conjugate-13 07/14/2018  . Tdap 03/25/2018  . Zoster Recombinat (Shingrix)  07/14/2018     Objective: Vital Signs: There were no vitals taken for this visit.   Physical Exam   Musculoskeletal Exam: ***  CDAI Exam: CDAI Score: -- Patient Global: --; Provider Global: -- Swollen: --; Tender: -- Joint Exam 10/06/2019   No joint exam has been documented for this visit   There is currently no information documented on the homunculus. Go to the  Rheumatology activity and complete the homunculus joint exam.  Investigation: No additional findings.  Imaging: NM Sentinel Node Inj-No Rpt (Breast)  Result Date: 08/30/2019 Sulfur colloid was injected by the nuclear medicine technologist for melanoma sentinel node.   MM Breast Surgical Specimen  Result Date: 08/30/2019 CLINICAL DATA:  59 year old with biopsy DCIS, invasive ductal carcinoma and atypical lobular hyperplasia involving the UPPER LEFT breast and UPPER OUTER QUADRANT of the LEFT breast. Radioactive seed localization was performed yesterday in anticipation of today's lumpectomy. EXAM: SPECIMEN RADIOGRAPH OF THE LEFT BREAST COMPARISON:  Previous exam(s). FINDINGS: Status post excision of the LEFT breast. Both radioactive seeds, the X shaped tissue marker clip, the dumbbell-shaped tissue marker clip, and the coil shaped tissue marker clip are present, completely intact, and are marked for pathology. The calcifications associated with DCIS extend to the posterior margin of the specimen, though Dr. [name] is in the process of removing more tissue at that margin according to the operating room nurse with whom I spoke at the time of interpretation, 08/30/2019 at 8:47 a.m. IMPRESSION: Specimen radiograph of the LEFT breast. Electronically Signed   By: Evangeline Dakin M.D.   On: 08/30/2019 08:51   MM LT RADIOACTIVE SEED LOC MAMMO GUIDE  Result Date: 08/29/2019 CLINICAL DATA:  Biopsy proven ductal carcinoma in situ with calcifications (coil shaped clip) and biopsy proven grade l invasive ductal carcinoma, atypical lobular hyperplasia and calcifications in the upper-outer quadrant, posterior (X shaped clip) in the left breast. EXAM: MAMMOGRAPHIC GUIDED RADIOACTIVE SEED LOCALIZATION OF THE LEFT BREAST COMPARISON:  Previous exam(s). FINDINGS: Patient presents for radioactive seed localization prior to surgery. I met with the patient and we discussed the procedure of seed localization including benefits  and alternatives. We discussed the high likelihood of a successful procedure. We discussed the risks of the procedure including infection, bleeding, tissue injury and further surgery. We discussed the low dose of radioactivity involved in the procedure. Informed, written consent was given. The usual time-out protocol was performed immediately prior to the procedure. Using mammographic guidance, sterile technique, 1% lidocaine and an I-125 radioactive seed, the coil shaped clip was localized using a lateral to medial approach. The follow-up mammogram images confirm the seed in the expected location and were marked for Dr. [name]. Follow-up survey of the patient confirms presence of the radioactive seed. Order number of I-125 seed:  [id number]. Total activity:  7.782 millicuries reference Date: 08/04/2019 The patient tolerated the procedure well and was released from the [facility]. She was given instructions regarding seed removal. Patient presents for radioactive seed localization prior to surgery. I met with the patient and we discussed the procedure of seed localization including benefits and alternatives. We discussed the high likelihood of a successful procedure. We discussed the risks of the procedure including infection, bleeding, tissue injury and further surgery. We discussed the low dose of radioactivity involved in the procedure. Informed, written consent was given. The usual time-out protocol was performed immediately prior to the procedure. Using mammographic guidance, sterile technique, 1% lidocaine and an I-125 radioactive seed, the X shaped clip was localized using  a lateral to medial approach. The follow-up mammogram images confirm the seed in the expected location and were marked for Dr. [name]. Follow-up survey of the patient confirms presence of the radioactive seed. Order number of I-125 seed:  [id number]. Total activity:  0.240 millicuries reference Date: 08/04/2019 The patient tolerated the  procedure well and was released from the [facility]. She was given instructions regarding seed removal. IMPRESSION: Radioactive seed localizations of coil and X shaped clips in the left breast. The radioactive seed is located approximately 7 mm inferior and lateral to the X shaped clip and the radioactive seed is located 5 mm posterior and superior to the coil shaped clip. Electronically Signed   By: Lillia Mountain M.D.   On: 08/29/2019 12:24   MM LT RAD SEED EA ADD LESION LOC MAMMO  Result Date: 08/29/2019 CLINICAL DATA:  Biopsy proven ductal carcinoma in situ with calcifications (coil shaped clip) and biopsy proven grade l invasive ductal carcinoma, atypical lobular hyperplasia and calcifications in the upper-outer quadrant, posterior (X shaped clip) in the left breast. EXAM: MAMMOGRAPHIC GUIDED RADIOACTIVE SEED LOCALIZATION OF THE LEFT BREAST COMPARISON:  Previous exam(s). FINDINGS: Patient presents for radioactive seed localization prior to surgery. I met with the patient and we discussed the procedure of seed localization including benefits and alternatives. We discussed the high likelihood of a successful procedure. We discussed the risks of the procedure including infection, bleeding, tissue injury and further surgery. We discussed the low dose of radioactivity involved in the procedure. Informed, written consent was given. The usual time-out protocol was performed immediately prior to the procedure. Using mammographic guidance, sterile technique, 1% lidocaine and an I-125 radioactive seed, the coil shaped clip was localized using a lateral to medial approach. The follow-up mammogram images confirm the seed in the expected location and were marked for Dr. [name]. Follow-up survey of the patient confirms presence of the radioactive seed. Order number of I-125 seed:  [id number]. Total activity:  4.268 millicuries reference Date: 08/04/2019 The patient tolerated the procedure well and was released from the  [facility]. She was given instructions regarding seed removal. Patient presents for radioactive seed localization prior to surgery. I met with the patient and we discussed the procedure of seed localization including benefits and alternatives. We discussed the high likelihood of a successful procedure. We discussed the risks of the procedure including infection, bleeding, tissue injury and further surgery. We discussed the low dose of radioactivity involved in the procedure. Informed, written consent was given. The usual time-out protocol was performed immediately prior to the procedure. Using mammographic guidance, sterile technique, 1% lidocaine and an I-125 radioactive seed, the X shaped clip was localized using a lateral to medial approach. The follow-up mammogram images confirm the seed in the expected location and were marked for Dr. [name]. Follow-up survey of the patient confirms presence of the radioactive seed. Order number of I-125 seed:  [id number]. Total activity:  7.989 millicuries reference Date: 08/04/2019 The patient tolerated the procedure well and was released from the Agency. She was given instructions regarding seed removal. IMPRESSION: Radioactive seed localizations of coil and X shaped clips in the left breast. The radioactive seed is located approximately 7 mm inferior and lateral to the X shaped clip and the radioactive seed is located 5 mm posterior and superior to the coil shaped clip. Electronically Signed   By: Lillia Mountain M.D.   On: 08/29/2019 12:24    Recent Labs: Lab Results  Component Value Date  WBC 7.4 08/26/2019   HGB 16.2 (H) 08/26/2019   PLT 236 08/26/2019   NA 142 08/26/2019   K 4.4 08/26/2019   CL 105 08/26/2019   CO2 28 08/26/2019   GLUCOSE 103 (H) 08/26/2019   BUN 17 08/26/2019   CREATININE 0.81 08/26/2019   BILITOT 0.6 08/26/2019   ALKPHOS 85 08/26/2019   AST 20 08/26/2019   ALT 17 08/26/2019   PROT 7.3 08/26/2019   ALBUMIN 4.0 08/26/2019    CALCIUM 9.3 08/26/2019   GFRAA >60 08/26/2019   QFTBGOLDPLUS NEGATIVE 05/03/2019    Speciality Comments: No specialty comments available.  Procedures:  No procedures performed Allergies: Patient has no known allergies.   Assessment / Plan:     Visit Diagnoses: No diagnosis found.  Orders: No orders of the defined types were placed in this encounter.  No orders of the defined types were placed in this encounter.   Face-to-face time spent with patient was *** minutes. Greater than 50% of time was spent in counseling and coordination of care.  Follow-Up Instructions: No follow-ups on file.   Earnestine Mealing, CMA  Note - This record has been created using Editor, commissioning.  Chart creation errors have been sought, but may not always  have been located. Such creation errors do not reflect on  the standard of medical care.

## 2019-09-23 NOTE — Telephone Encounter (Signed)
Received oncotype score of 15/4%. Physician team notified. Called pt and left vm with results and next steps. Contact information provided for questions or needs.

## 2019-09-27 ENCOUNTER — Encounter (HOSPITAL_COMMUNITY): Payer: Self-pay | Admitting: Oncology

## 2019-09-28 ENCOUNTER — Ambulatory Visit: Payer: Medicare Other | Admitting: Radiation Oncology

## 2019-09-28 ENCOUNTER — Ambulatory Visit: Payer: Medicare Other

## 2019-09-28 MED FILL — ENBREL MINI 50 MG/ML SOCT: 50 | 28 days supply | Qty: 4 | Fill #1

## 2019-09-30 ENCOUNTER — Other Ambulatory Visit: Payer: Self-pay

## 2019-09-30 ENCOUNTER — Encounter (HOSPITAL_BASED_OUTPATIENT_CLINIC_OR_DEPARTMENT_OTHER): Payer: Self-pay | Admitting: General Surgery

## 2019-10-04 ENCOUNTER — Other Ambulatory Visit (HOSPITAL_COMMUNITY)
Admission: RE | Admit: 2019-10-04 | Discharge: 2019-10-04 | Disposition: A | Discharge status: Home / Self Care | Payer: Medicare Other | Source: Ambulatory Visit | Attending: General Surgery | Admitting: General Surgery

## 2019-10-04 DIAGNOSIS — Z20822 Contact with and (suspected) exposure to covid-19: Secondary | ICD-10-CM | POA: Insufficient documentation

## 2019-10-04 DIAGNOSIS — Z01812 Encounter for preprocedural laboratory examination: Secondary | ICD-10-CM | POA: Insufficient documentation

## 2019-10-04 LAB — SARS CORONAVIRUS 2 (TAT 6-24 HRS): SARS Coronavirus 2: NEGATIVE

## 2019-10-06 ENCOUNTER — Ambulatory Visit: Payer: Medicare Other | Admitting: Physician Assistant

## 2019-10-07 ENCOUNTER — Ambulatory Visit (HOSPITAL_BASED_OUTPATIENT_CLINIC_OR_DEPARTMENT_OTHER): Payer: Medicare Other | Admitting: Certified Registered"

## 2019-10-07 ENCOUNTER — Ambulatory Visit (HOSPITAL_BASED_OUTPATIENT_CLINIC_OR_DEPARTMENT_OTHER)
Admission: RE | Admit: 2019-10-07 | Discharge: 2019-10-07 | Disposition: A | Discharge status: Home / Self Care | Payer: Medicare Other | Attending: General Surgery | Admitting: General Surgery

## 2019-10-07 ENCOUNTER — Encounter (HOSPITAL_BASED_OUTPATIENT_CLINIC_OR_DEPARTMENT_OTHER): Payer: Self-pay | Admitting: General Surgery

## 2019-10-07 ENCOUNTER — Other Ambulatory Visit: Payer: Self-pay

## 2019-10-07 ENCOUNTER — Encounter (HOSPITAL_BASED_OUTPATIENT_CLINIC_OR_DEPARTMENT_OTHER)
Admission: RE | Disposition: A | Discharge status: Home / Self Care | Payer: Self-pay | Source: Home / Self Care | Attending: General Surgery

## 2019-10-07 DIAGNOSIS — N6082 Other benign mammary dysplasias of left breast: Secondary | ICD-10-CM | POA: Diagnosis not present

## 2019-10-07 DIAGNOSIS — F17200 Nicotine dependence, unspecified, uncomplicated: Secondary | ICD-10-CM | POA: Diagnosis not present

## 2019-10-07 DIAGNOSIS — Z170 Estrogen receptor positive status [ER+]: Secondary | ICD-10-CM | POA: Diagnosis not present

## 2019-10-07 DIAGNOSIS — J449 Chronic obstructive pulmonary disease, unspecified: Secondary | ICD-10-CM | POA: Insufficient documentation

## 2019-10-07 DIAGNOSIS — Z79899 Other long term (current) drug therapy: Secondary | ICD-10-CM | POA: Insufficient documentation

## 2019-10-07 DIAGNOSIS — C50412 Malignant neoplasm of upper-outer quadrant of left female breast: Secondary | ICD-10-CM | POA: Diagnosis present

## 2019-10-07 HISTORY — PX: RE-EXCISION OF BREAST LUMPECTOMY: SHX6048

## 2019-10-07 SURGERY — EXCISION, LESION, BREAST
Anesthesia: General | Site: Breast | Laterality: Left

## 2019-10-07 MED ORDER — ONDANSETRON HCL 4 MG/2ML IJ SOLN: 4.0000 mg | Freq: Once | INTRAMUSCULAR | Status: DC | PRN

## 2019-10-07 MED ORDER — MIDAZOLAM HCL 2 MG/2ML IJ SOLN
INTRAMUSCULAR | Status: AC
  Filled 2019-10-07: qty 2

## 2019-10-07 MED ORDER — EPHEDRINE SULFATE 50 MG/ML IJ SOLN
INTRAMUSCULAR | Status: DC | PRN
  Administered 2019-10-07: 10 mg via INTRAVENOUS
  Administered 2019-10-07: 5 mg via INTRAVENOUS

## 2019-10-07 MED ORDER — CELECOXIB 200 MG PO CAPS
200.0000 mg | ORAL_CAPSULE | ORAL | Status: AC
  Administered 2019-10-07: 200 mg via ORAL

## 2019-10-07 MED ORDER — HYDROCODONE-ACETAMINOPHEN 5-325 MG PO TABS
1.0000 | ORAL_TABLET | Freq: Four times a day (QID) | ORAL | Status: AC | PRN
  Administered 2019-10-07: 1 via ORAL

## 2019-10-07 MED ORDER — HYDROCODONE-ACETAMINOPHEN 5-325 MG PO TABS
ORAL_TABLET | ORAL | Status: AC
  Filled 2019-10-07: qty 1

## 2019-10-07 MED ORDER — PHENYLEPHRINE HCL (PRESSORS) 10 MG/ML IV SOLN
INTRAVENOUS | Status: DC | PRN
  Administered 2019-10-07: 40 ug via INTRAVENOUS
  Administered 2019-10-07 (×2): 80 ug via INTRAVENOUS

## 2019-10-07 MED ORDER — CEFAZOLIN SODIUM-DEXTROSE 2-4 GM/100ML-% IV SOLN
2.0000 g | INTRAVENOUS | Status: AC
  Administered 2019-10-07: 2 g via INTRAVENOUS

## 2019-10-07 MED ORDER — LACTATED RINGERS IV SOLN: INTRAVENOUS | Status: DC

## 2019-10-07 MED ORDER — CHLORHEXIDINE GLUCONATE CLOTH 2 % EX PADS: 6.0000 | MEDICATED_PAD | Freq: Once | CUTANEOUS | Status: DC

## 2019-10-07 MED ORDER — CEFAZOLIN SODIUM-DEXTROSE 2-4 GM/100ML-% IV SOLN
INTRAVENOUS | Status: AC
  Filled 2019-10-07: qty 100

## 2019-10-07 MED ORDER — PROPOFOL 500 MG/50ML IV EMUL
INTRAVENOUS | Status: AC
  Filled 2019-10-07: qty 50

## 2019-10-07 MED ORDER — GABAPENTIN 300 MG PO CAPS
300.0000 mg | ORAL_CAPSULE | ORAL | Status: AC
  Administered 2019-10-07: 300 mg via ORAL

## 2019-10-07 MED ORDER — CELECOXIB 200 MG PO CAPS
ORAL_CAPSULE | ORAL | Status: AC
  Filled 2019-10-07: qty 1

## 2019-10-07 MED ORDER — LIDOCAINE HCL (CARDIAC) PF 100 MG/5ML IV SOSY
PREFILLED_SYRINGE | INTRAVENOUS | Status: DC | PRN
  Administered 2019-10-07: 100 mg via INTRAVENOUS

## 2019-10-07 MED ORDER — DEXAMETHASONE SODIUM PHOSPHATE 10 MG/ML IJ SOLN
INTRAMUSCULAR | Status: DC | PRN
  Administered 2019-10-07: 5 mg via INTRAVENOUS

## 2019-10-07 MED ORDER — FENTANYL CITRATE (PF) 100 MCG/2ML IJ SOLN
INTRAMUSCULAR | Status: DC | PRN
  Administered 2019-10-07: 50 ug via INTRAVENOUS

## 2019-10-07 MED ORDER — ACETAMINOPHEN 500 MG PO TABS
ORAL_TABLET | ORAL | Status: AC
  Filled 2019-10-07: qty 2

## 2019-10-07 MED ORDER — HYDROCODONE-ACETAMINOPHEN 5-325 MG PO TABS: 1.0000 | ORAL_TABLET | Freq: Four times a day (QID) | ORAL | 0 refills | Status: DC | PRN

## 2019-10-07 MED ORDER — FENTANYL CITRATE (PF) 100 MCG/2ML IJ SOLN
INTRAMUSCULAR | Status: AC
  Filled 2019-10-07: qty 2

## 2019-10-07 MED ORDER — HYDROMORPHONE HCL 1 MG/ML IJ SOLN
INTRAMUSCULAR | Status: AC
  Filled 2019-10-07: qty 0.5

## 2019-10-07 MED ORDER — BUPIVACAINE-EPINEPHRINE 0.25% -1:200000 IJ SOLN
INTRAMUSCULAR | Status: DC | PRN
  Administered 2019-10-07: 8 mL

## 2019-10-07 MED ORDER — PROPOFOL 10 MG/ML IV BOLUS
INTRAVENOUS | Status: DC | PRN
  Administered 2019-10-07: 150 mg via INTRAVENOUS

## 2019-10-07 MED ORDER — MIDAZOLAM HCL 5 MG/5ML IJ SOLN
INTRAMUSCULAR | Status: DC | PRN
  Administered 2019-10-07: 2 mg via INTRAVENOUS

## 2019-10-07 MED ORDER — GABAPENTIN 300 MG PO CAPS
ORAL_CAPSULE | ORAL | Status: AC
  Filled 2019-10-07: qty 1

## 2019-10-07 MED ORDER — HYDROMORPHONE HCL 1 MG/ML IJ SOLN
0.2500 mg | INTRAMUSCULAR | Status: DC | PRN
  Administered 2019-10-07: 0.25 mg via INTRAVENOUS

## 2019-10-07 MED ORDER — ONDANSETRON HCL 4 MG/2ML IJ SOLN
INTRAMUSCULAR | Status: DC | PRN
  Administered 2019-10-07: 4 mg via INTRAVENOUS

## 2019-10-07 MED ORDER — ACETAMINOPHEN 500 MG PO TABS
1000.0000 mg | ORAL_TABLET | ORAL | Status: AC
  Administered 2019-10-07: 1000 mg via ORAL

## 2019-10-07 MED ORDER — MEPERIDINE HCL 25 MG/ML IJ SOLN: 6.2500 mg | INTRAMUSCULAR | Status: DC | PRN

## 2019-10-07 SURGICAL SUPPLY — 40 items
ADH SKN CLS APL DERMABOND .7 (GAUZE/BANDAGES/DRESSINGS) ×1
APL PRP STRL LF DISP 70% ISPRP (MISCELLANEOUS) ×1
BLADE SURG 15 STRL LF DISP TIS (BLADE) ×1 IMPLANT
BLADE SURG 15 STRL SS (BLADE) ×3
CANISTER SUCT 1200ML W/VALVE (MISCELLANEOUS) ×3 IMPLANT
CHLORAPREP W/TINT 26 (MISCELLANEOUS) ×3 IMPLANT
CLIP VESOCCLUDE SM WIDE 6/CT (CLIP) IMPLANT
COVER BACK TABLE 60X90IN (DRAPES) ×3 IMPLANT
COVER MAYO STAND STRL (DRAPES) ×3 IMPLANT
COVER WAND RF STERILE (DRAPES) IMPLANT
DECANTER SPIKE VIAL GLASS SM (MISCELLANEOUS) ×3 IMPLANT
DERMABOND ADVANCED (GAUZE/BANDAGES/DRESSINGS) ×2
DERMABOND ADVANCED .7 DNX12 (GAUZE/BANDAGES/DRESSINGS) ×1 IMPLANT
DRAPE LAPAROSCOPIC ABDOMINAL (DRAPES) ×3 IMPLANT
DRAPE UTILITY XL STRL (DRAPES) ×3 IMPLANT
ELECT COATED BLADE 2.86 ST (ELECTRODE) ×3 IMPLANT
ELECT REM PT RETURN 9FT ADLT (ELECTROSURGICAL) ×3
ELECTRODE REM PT RTRN 9FT ADLT (ELECTROSURGICAL) ×1 IMPLANT
GLOVE BIO SURGEON STRL SZ7.5 (GLOVE) ×3 IMPLANT
GOWN STRL REUS W/ TWL LRG LVL3 (GOWN DISPOSABLE) ×2 IMPLANT
GOWN STRL REUS W/TWL LRG LVL3 (GOWN DISPOSABLE) ×6
ILLUMINATOR WAVEGUIDE N/F (MISCELLANEOUS) IMPLANT
LIGHT WAVEGUIDE WIDE FLAT (MISCELLANEOUS) IMPLANT
NDL HYPO 25X1 1.5 SAFETY (NEEDLE) ×1 IMPLANT
NEEDLE HYPO 25X1 1.5 SAFETY (NEEDLE) ×3 IMPLANT
NS IRRIG 1000ML POUR BTL (IV SOLUTION) IMPLANT
PACK BASIN DAY SURGERY FS (CUSTOM PROCEDURE TRAY) ×3 IMPLANT
PENCIL SMOKE EVACUATOR (MISCELLANEOUS) ×3 IMPLANT
SLEEVE SCD COMPRESS KNEE MED (MISCELLANEOUS) ×3 IMPLANT
SPONGE LAP 18X18 RF (DISPOSABLE) ×3 IMPLANT
STAPLER VISISTAT 35W (STAPLE) IMPLANT
SUT MON AB 4-0 PC3 18 (SUTURE) ×3 IMPLANT
SUT SILK 2 0 SH (SUTURE) IMPLANT
SUT VICRYL 3-0 CR8 SH (SUTURE) ×3 IMPLANT
SYR CONTROL 10ML LL (SYRINGE) ×3 IMPLANT
TOWEL GREEN STERILE FF (TOWEL DISPOSABLE) ×3 IMPLANT
TRAY FAXITRON CT DISP (TRAY / TRAY PROCEDURE) IMPLANT
TUBE CONNECTING 20'X1/4 (TUBING) ×1
TUBE CONNECTING 20X1/4 (TUBING) ×2 IMPLANT
YANKAUER SUCT BULB TIP NO VENT (SUCTIONS) ×3 IMPLANT

## 2019-10-07 NOTE — H&P (Signed)
[name]  Location: [facility] Surgery Patient #: [id number] DOB: [birth year] Widowed / Language: Cleophus Molt / Race: White Female   History of Present Illness  The patient is a 59 year old female who presents for a follow-up for Breast cancer. The patient is a 59 year old white female who is about 3 weeks status post left breast lumpectomy and sentinel node biopsy for a T1 cN0 left breast cancer that was ER and PR positive and HER-2 negative with a Ki-67 of 1%. She tolerated the surgery well.   Allergies  No Known Drug Allergies   Medication History  Enbrel Mini (50MG/ML Soln Cartridge, Subcutaneous) Active. Vitamin D (1000UNIT Tablet, Oral) Active. Medications Reconciled    Review of Systems  General Not Present- Appetite Loss, Chills, Fatigue, Fever, Night Sweats, Weight Gain and Weight Loss. Note: All other systems negative (unless as noted in HPI & included Review of Systems) Skin Not Present- Change in Wart/Mole, Dryness, Hives, Jaundice, New Lesions, Non-Healing Wounds, Rash and Ulcer. HEENT Not Present- Earache, Hearing Loss, Hoarseness, Nose Bleed, Oral Ulcers, Ringing in the Ears, Seasonal Allergies, Sinus Pain, Sore Throat, Visual Disturbances, Wears glasses/contact lenses and Yellow Eyes. Respiratory Not Present- Bloody sputum, Chronic Cough, Difficulty Breathing, Snoring and Wheezing. Breast Not Present- Breast Mass, Breast Pain, Nipple Discharge and Skin Changes. Cardiovascular Not Present- Chest Pain, Difficulty Breathing Lying Down, Leg Cramps, Palpitations, Rapid Heart Rate, Shortness of Breath and Swelling of Extremities. Gastrointestinal Not Present- Abdominal Pain, Bloating, Bloody Stool, Change in Bowel Habits, Chronic diarrhea, Constipation, Difficulty Swallowing, Excessive gas, Gets full quickly at meals, Hemorrhoids, Indigestion, Nausea, Rectal Pain and Vomiting. Female Genitourinary Not Present- Frequency, Nocturia, Painful Urination, Pelvic Pain and  Urgency. Musculoskeletal Not Present- Back Pain, Joint Pain, Joint Stiffness, Muscle Pain, Muscle Weakness and Swelling of Extremities. Neurological Not Present- Decreased Memory, Fainting, Headaches, Numbness, Seizures, Tingling, Tremor, Trouble walking and Weakness. Psychiatric Not Present- Anxiety, Bipolar, Change in Sleep Pattern, Depression, Fearful and Frequent crying. Endocrine Not Present- Cold Intolerance, Excessive Hunger, Hair Changes, Heat Intolerance, Hot flashes and New Diabetes. Hematology Not Present- Easy Bruising, Excessive bleeding, Gland problems, HIV and Persistent Infections.  Vitals  Weight: 98.38 lb Height: 65.5in Body Surface Area: 1.47 m Body Mass Index: 16.12 kg/m  Temp.: 98.52F  Pulse: 110 (Regular)  BP: 120/72(Sitting, Left Arm, Standard)       Physical Exam  General Mental Status-Alert. General Appearance-Consistent with stated age. Hydration-Well hydrated. Voice-Normal.  Head and Neck Head-normocephalic, atraumatic with no lesions or palpable masses. Trachea-midline. Thyroid Gland Characteristics - normal size and consistency.  Eye Eyeball - Bilateral-Extraocular movements intact. Sclera/Conjunctiva - Bilateral-No scleral icterus.  Chest and Lung Exam Chest and lung exam reveals -quiet, even and easy respiratory effort with no use of accessory muscles and on auscultation, normal breath sounds, no adventitious sounds and normal vocal resonance. Inspection Chest Wall - Normal. Back - normal.  Breast Note: The upper outer quadrant left breast and left axillary incisions are healing nicely with no sign of infection or seroma.   Cardiovascular Cardiovascular examination reveals -normal heart sounds, regular rate and rhythm with no murmurs and normal pedal pulses bilaterally.  Abdomen Inspection Inspection of the abdomen reveals - No Hernias. Skin - Scar - no surgical scars. Palpation/Percussion Palpation  and Percussion of the abdomen reveal - Soft, Non Tender, No Rebound tenderness, No Rigidity (guarding) and No hepatosplenomegaly. Auscultation Auscultation of the abdomen reveals - Bowel sounds normal.  Neurologic Neurologic evaluation reveals -alert and oriented x 3 with no impairment  of recent or remote memory. Mental Status-Normal.  Musculoskeletal Normal Exam - Left-Upper Extremity Strength Normal and Lower Extremity Strength Normal. Normal Exam - Right-Upper Extremity Strength Normal and Lower Extremity Strength Normal.  Lymphatic Head & Neck  General Head & Neck Lymphatics: Bilateral - Description - Normal. Axillary  General Axillary Region: Bilateral - Description - Normal. Tenderness - Non Tender. Femoral & Inguinal  Generalized Femoral & Inguinal Lymphatics: Bilateral - Description - Normal. Tenderness - Non Tender.    Assessment & Plan  MALIGNANT NEOPLASM OF UPPER-OUTER QUADRANT OF LEFT BREAST IN FEMALE, ESTROGEN RECEPTOR POSITIVE (C50.412) Impression: The patient is about 3 weeks status post left breast lumpectomy for breast cancer. She tolerated the surgery well. She does have a focally positive inferior margin for DCIS. This will need to be reexcised. I have discussed with her in detail the risk and benefits of the operation as well as some of the technical aspects and she understands and wishes to proceed. Unfortunately her Oncotype will not be back by Monday which is when her surgery is scheduled for. I have talked to her about this and we will plan to move it back to the following Friday, July 30, so that we will know the results of the Oncotype concerning whether she will need a port. This will also allow her son to be with her at the time of surgery. Current Plans Referred to Physical Therapy, for evaluation and follow up (Physical Therapy). Routine.

## 2019-10-07 NOTE — Anesthesia Preprocedure Evaluation (Signed)
Anesthesia Evaluation  Patient identified by MRN, date of birth, ID band Patient awake    Reviewed: Allergy & Precautions, NPO status , Patient's Chart, lab work & pertinent test results  Airway Mallampati: I  TM Distance: >3 FB Neck ROM: Full    Dental   Pulmonary COPD, Current Smoker and Patient abstained from smoking.,    Pulmonary exam normal        Cardiovascular Normal cardiovascular exam     Neuro/Psych Anxiety Depression    GI/Hepatic   Endo/Other    Renal/GU      Musculoskeletal  (+) Arthritis , Rheumatoid disorders,    Abdominal   Peds  Hematology   Anesthesia Other Findings   Reproductive/Obstetrics                             Anesthesia Physical Anesthesia Plan  ASA: II  Anesthesia Plan: General   Post-op Pain Management:    Induction: Intravenous  PONV Risk Score and Plan: 2 and Ondansetron and Midazolam  Airway Management Planned: LMA  Additional Equipment:   Intra-op Plan:   Post-operative Plan: Extubation in OR  Informed Consent: I have reviewed the patients History and Physical, chart, labs and discussed the procedure including the risks, benefits and alternatives for the proposed anesthesia with the patient or authorized representative who has indicated his/her understanding and acceptance.       Plan Discussed with: CRNA and Surgeon  Anesthesia Plan Comments:         Anesthesia Quick Evaluation

## 2019-10-07 NOTE — Transfer of Care (Signed)
Immediate Anesthesia Transfer of Care Note  Patient: [name]  Procedure(s) Performed: RE-EXCISION OF LEFT BREAST INFERIOR MARGIN (Left Breast)  Patient Location: PACU  Anesthesia Type:General  Level of Consciousness: drowsy  Airway & Oxygen Therapy: Patient Spontanous Breathing and Patient connected to face mask oxygen  Post-op Assessment: Report given to RN and Post -op Vital signs reviewed and stable  Post vital signs: Reviewed and stable  Last Vitals:  Vitals Value Taken Time  BP 120/72 10/07/19 0824  Temp    Pulse 69 10/07/19 0826  Resp 16 10/07/19 0826  SpO2 100 % 10/07/19 0826  Vitals shown include unvalidated device data.  Last Pain:  Vitals:   10/07/19 0630  TempSrc: Oral  PainSc: 0-No pain         Complications: No complications documented.

## 2019-10-07 NOTE — Op Note (Signed)
10/07/2019  8:18 AM  PATIENT:  [name]  59 y.o. female  PRE-OPERATIVE DIAGNOSIS:  LEFT BREAST CANCER  POST-OPERATIVE DIAGNOSIS:  LEFT BREAST CANCER  PROCEDURE:  Procedure(s): RE-EXCISION OF LEFT BREAST INFERIOR MARGIN (Left)  SURGEON:  Surgeon(s) and Role:    * [name], MD - Primary  PHYSICIAN ASSISTANT:   ASSISTANTS: none   ANESTHESIA:   local and general  EBL:  5 mL   BLOOD ADMINISTERED:none  DRAINS: none   LOCAL MEDICATIONS USED:  MARCAINE     SPECIMEN:  Source of Specimen:  left breast inferior margin  DISPOSITION OF SPECIMEN:  PATHOLOGY  COUNTS:  YES  TOURNIQUET:  * No tourniquets in log *  DICTATION: .Dragon Dictation   After informed consent was obtained the patient was brought to the operating room and placed in the supine position on the operating table.  After adequate induction of general anesthesia the patient's left breast was prepped with ChloraPrep, allowed to dry, and draped in usual sterile manner.  An appropriate timeout was performed.  The area around the previous incision was infiltrated with quarter percent Marcaine.  The previous incision was then opened sharply with a 15 blade knife and electrocautery down to the chest wall.  The clips marking the cavity were identified but there was no residual seroma cavity.  The inferior margin was then reexcised sharply with the electrocautery.  Once the specimen was removed it was marked with a short stitch on the new true inferior margin and a long stitch on the medial edge.  The specimen was then sent to pathology for further evaluation.  Hemostasis was achieved using the Bovie electrocautery.  The deep layer of the wound was then closed with interrupted 3-0 Vicryl stitches.  The tissue had a very normal appearance.  The skin was then closed with a running 4-0 Monocryl subcuticular stitch.  Dermabond dressings were applied.  The patient tolerated the procedure well.  At the end of the case all needle  sponge and instrument counts were correct.  The patient was then awakened and taken to recovery in stable condition.  PLAN OF CARE: Discharge to home after PACU  PATIENT DISPOSITION:  PACU - hemodynamically stable.   Delay start of Pharmacological VTE agent (>24hrs) due to surgical blood loss or risk of bleeding: not applicable

## 2019-10-07 NOTE — Anesthesia Postprocedure Evaluation (Signed)
Anesthesia Post Note  Patient: [name]  Procedure(s) Performed: RE-EXCISION OF LEFT BREAST INFERIOR MARGIN (Left Breast)     Patient location during evaluation: PACU Anesthesia Type: General Level of consciousness: awake and alert Pain management: pain level controlled Vital Signs Assessment: post-procedure vital signs reviewed and stable Respiratory status: spontaneous breathing, nonlabored ventilation, respiratory function stable and patient connected to nasal cannula oxygen Cardiovascular status: blood pressure returned to baseline and stable Postop Assessment: no apparent nausea or vomiting Anesthetic complications: no   No complications documented.  Last Vitals:  Vitals:   10/07/19 0900 10/07/19 0922  BP:  120/76  Pulse: 67 68  Resp: 15 18  Temp:  36.6 C  SpO2: 100% 98%    Last Pain:  Vitals:   10/07/19 0922  TempSrc:   PainSc: 5                  Ashlyn Cabler DAVID

## 2019-10-07 NOTE — Discharge Instr - Supplementary Instructions (Signed)
Norco given at 0922, next dose in 6 hours, after 3:30 Friday afternoon

## 2019-10-07 NOTE — Anesthesia Procedure Notes (Signed)
Procedure Name: LMA Insertion Date/Time: 10/07/2019 7:39 AM Performed by: Lavonia [place], CRNA Pre-anesthesia Checklist: Patient identified, Emergency Drugs available, Suction available and Patient being monitored Patient Re-evaluated:Patient Re-evaluated prior to induction Oxygen Delivery Method: Circle system utilized Preoxygenation: Pre-oxygenation with 100% oxygen Induction Type: IV induction Ventilation: Mask ventilation without difficulty LMA: LMA inserted LMA Size: 4.0 Number of attempts: 1 Airway Equipment and Method: Bite block Placement Confirmation: positive ETCO2 Tube secured with: Tape Dental Injury: Teeth and Oropharynx as per pre-operative assessment

## 2019-10-07 NOTE — Interval H&P Note (Signed)
History and Physical Interval Note:  10/07/2019 7:25 AM  [name]  has presented today for surgery, with the diagnosis of LEFT BREAST CANCER.  The various methods of treatment have been discussed with the patient and family. After consideration of risks, benefits and other options for treatment, the patient has consented to  Procedure(s): RE-EXCISION OF LEFT BREAST INFERIOR MARGIN (Left) as a surgical intervention.  The patient's history has been reviewed, patient examined, no change in status, stable for surgery.  I have reviewed the patient's chart and labs.  Questions were answered to the patient's satisfaction.     [name]

## 2019-10-07 NOTE — Discharge Instructions (Signed)

## 2019-10-10 ENCOUNTER — Encounter (HOSPITAL_BASED_OUTPATIENT_CLINIC_OR_DEPARTMENT_OTHER): Payer: Self-pay | Admitting: General Surgery

## 2019-10-10 ENCOUNTER — Other Ambulatory Visit: Payer: Self-pay | Admitting: *Deleted

## 2019-10-10 DIAGNOSIS — C50919 Malignant neoplasm of unspecified site of unspecified female breast: Secondary | ICD-10-CM

## 2019-10-10 LAB — SURGICAL PATHOLOGY

## 2019-10-11 ENCOUNTER — Inpatient Hospital Stay: Payer: Medicare Other | Admitting: Oncology

## 2019-10-11 ENCOUNTER — Inpatient Hospital Stay: Payer: Medicare Other | Attending: Oncology

## 2019-10-11 ENCOUNTER — Other Ambulatory Visit: Payer: Self-pay | Admitting: *Deleted

## 2019-10-11 DIAGNOSIS — C50412 Malignant neoplasm of upper-outer quadrant of left female breast: Secondary | ICD-10-CM

## 2019-10-11 DIAGNOSIS — Z170 Estrogen receptor positive status [ER+]: Secondary | ICD-10-CM

## 2019-10-13 ENCOUNTER — Encounter: Payer: Self-pay | Admitting: *Deleted

## 2019-10-20 ENCOUNTER — Ambulatory Visit: Payer: Medicare Other | Admitting: Physical Therapy

## 2019-10-21 NOTE — Progress Notes (Signed)
Error

## 2019-10-21 NOTE — Progress Notes (Signed)
Patient here for a f/u new consult with Dr. Sondra Come.  History of Present Illness  The patient is a 59 year old female who presents for a follow-up for Breast cancer. The patient is a 59 year old white female who is about 3 weeks status post left breast lumpectomy and sentinel node biopsy for a T1 cN0 left breast cancer that was ER and PR positive and HER-2 negative with a Ki-67 of 1%.  She tolerated the surgery well.  10/07/2019   8:18 AM   PATIENT:  [name]  59 y.o. female   PRE-OPERATIVE DIAGNOSIS:  LEFT BREAST CANCER   POST-OPERATIVE DIAGNOSIS:  LEFT BREAST CANCER   PROCEDURE:  Procedure(s): RE-EXCISION OF LEFT BREAST INFERIOR MARGIN (Left)   SURGEON:  Surgeon(s) and Role:    * [name], MD - Primary    Status:  Edited Result - FINAL  Visible to patient:  No (inaccessible in MyChart)  Next appt:  10/24/2019 at 08:30 AM in Radiation Oncology ([place]) 0 Result Notes Component 2 wk ago  SURGICAL PATHOLOGY SURGICAL PATHOLOGY  CASE: [phone]  PATIENT: [name]  Surgical Pathology Report      Clinical History: left breast cancer (cm)    FINAL MICROSCOPIC DIAGNOSIS:   A. BREAST, LEFT NEW INFERIOR MARGIN, EXCISION:  -  Fibrocystic changes with apocrine metaplasia  -  Benign breast tissue with calcifications  -  Previous surgical site changes present  -  No residual carcinoma identified   GROSS DESCRIPTION:   Received fresh and placed in formalin at 10:20 AM is a 4.5 x 2.5 x 0.6  cm portion of tan-pink firm tissue, with a short stitch at the new  inferior margin and long suture at the medial margin.  The new inferior  margin is inked black.  All other margins are inked yellow.  Specimen  sectioned from medial to lateral and submitted entirely in 6 cassettes.  (AK 10/07/2019)    Final Diagnosis performed by Thressa Sheller, MD.   Electronically signed  10/10/2019  Technical and / or Professional components performed at [facility]. [facility], [place] [address], [place], [place] [address].   Immunohistochemistry Technical component (if applicable) was performed  at [facility]. [address] [place],  [place], [place] [address].   IMMUNOHISTOCHEMISTRY DISCLAIMER (if applicable):  Some of these immunohistochemical stains may have been developed and the  performance characteristics determine by [facility]. Some  may not have been cleared or approved by the U.S. Food and Drug  Administration. The FDA has determined that such clearance or approval  is not necessary. This test is used for clinical purposes. It should not  be regarded as investigational or for research. This laboratory is  certified under the [place]  (CLIA-88) as qualified to perform high complexity clinical laboratory  testing.  The controls stained appropriately.        SAFETY ISSUES: Prior radiation? No Pacemaker/ICD? No Possible current pregnancy? No Is the patient on methotrexate? Yes--took last injection yesterday 07/06/2019   Vs  Wt  Patient did not keep appointment.

## 2019-10-24 ENCOUNTER — Ambulatory Visit
Admission: RE | Admit: 2019-10-24 | Discharge: 2019-10-24 | Disposition: A | Discharge status: Home / Self Care | Payer: Medicare Other | Source: Ambulatory Visit | Attending: Radiation Oncology | Admitting: Radiation Oncology

## 2019-10-24 ENCOUNTER — Ambulatory Visit: Payer: Medicare Other

## 2019-10-25 ENCOUNTER — Telehealth: Payer: Self-pay | Admitting: Adult Health

## 2019-10-25 ENCOUNTER — Telehealth: Payer: Self-pay | Admitting: Oncology

## 2019-10-25 NOTE — Telephone Encounter (Signed)
Called pt to reschedule 8/3 appt per voicemial from pt. Left voicemail for pt to call office to reschedule.

## 2019-10-25 NOTE — Telephone Encounter (Signed)
Called pt back to reschedule 8/3 appt per 8/17 voicemail. Pt confirmed new appt date and time.

## 2019-10-28 ENCOUNTER — Encounter: Payer: Self-pay | Admitting: *Deleted

## 2019-11-03 ENCOUNTER — Telehealth: Payer: Self-pay | Admitting: Oncology

## 2019-11-03 NOTE — Telephone Encounter (Signed)
Rescheduled 9/1 appt to 9/17 appt per [facility] request. Pt confirmed new appt date and time.

## 2019-11-07 MED FILL — ENBREL MINI 50 MG/ML SOCT: 50 | 28 days supply | Qty: 4 | Fill #2

## 2019-11-09 ENCOUNTER — Inpatient Hospital Stay: Payer: Medicare Other

## 2019-11-09 ENCOUNTER — Inpatient Hospital Stay: Payer: Medicare Other | Admitting: Adult Health

## 2019-11-10 ENCOUNTER — Telehealth: Payer: Self-pay

## 2019-11-10 ENCOUNTER — Ambulatory Visit: Payer: Medicare Other | Admitting: Radiation Oncology

## 2019-11-10 ENCOUNTER — Ambulatory Visit: Payer: Medicare Other

## 2019-11-10 ENCOUNTER — Other Ambulatory Visit: Payer: Self-pay

## 2019-11-10 ENCOUNTER — Ambulatory Visit
Admission: RE | Admit: 2019-11-10 | Discharge: 2019-11-10 | Disposition: A | Discharge status: Home / Self Care | Payer: Medicare Other | Source: Ambulatory Visit | Attending: Radiation Oncology | Admitting: Radiation Oncology

## 2019-11-10 DIAGNOSIS — Z170 Estrogen receptor positive status [ER+]: Secondary | ICD-10-CM | POA: Insufficient documentation

## 2019-11-10 DIAGNOSIS — C50412 Malignant neoplasm of upper-outer quadrant of left female breast: Secondary | ICD-10-CM | POA: Insufficient documentation

## 2019-11-10 NOTE — Telephone Encounter (Signed)
1310 Patient called and ML left for patient to contact the clinic if she needs to r/s her appt. Contact information provided on voicemail.

## 2019-11-10 NOTE — Progress Notes (Signed)
  Patient did not keep her appointment today.             Show:Clear all $RemoveBefore'[x]'bCOxpuhcwWevR$ Manual$R'[]'kf$ [place]a'[x]'$ Copied  Added by: $RemoveB'[x]'WaBjpiwT$ Kailo Kosik, Carlton Adam, RN  $R'[]'ta$ Hover for details Patient here for a f/u new consult with Dr. Sondra Come.   History of Present Illness  The patient is a 59 year old female who presents for a follow-up for Breast cancer. The patient is a 59 year old white female who is about 3 weeks status post left breast lumpectomy and sentinel node biopsy for a T1 cN0 left breast cancer that was ER and PR positive and HER-2 negative with a Ki-67 of 1%.  She tolerated the surgery well.   10/07/2019   8:18 AM   PATIENT:  [name]  59 y.o. female   PRE-OPERATIVE DIAGNOSIS:  LEFT BREAST CANCER   POST-OPERATIVE DIAGNOSIS:  LEFT BREAST CANCER   PROCEDURE:  Procedure(s): RE-EXCISION OF LEFT BREAST INFERIOR MARGIN (Left)   SURGEON:  Surgeon(s) and Role:    * [name], MD - Primary      Status:  Edited Result - FINAL  Visible to patient:  No (inaccessible in MyChart)  Next appt:  10/24/2019 at 08:30 AM in Radiation Oncology ([place]) 0 Result Notes Component 2 wk ago  SURGICAL PATHOLOGY SURGICAL PATHOLOGY  CASE: [phone]  PATIENT: [name]  Surgical Pathology Report      Clinical History: left breast cancer (cm)    FINAL MICROSCOPIC DIAGNOSIS:   A. BREAST, LEFT NEW INFERIOR MARGIN, EXCISION:  -  Fibrocystic changes with apocrine metaplasia  -  Benign breast tissue with calcifications  -  Previous surgical site changes present  -  No residual carcinoma identified   GROSS DESCRIPTION:   Received fresh and placed in formalin at 10:20 AM is a 4.5 x 2.5 x 0.6  cm portion of tan-pink firm tissue, with a short stitch at the new  inferior margin and long suture at the medial margin.  The new inferior  margin is inked black.  All other margins are inked yellow.  Specimen  sectioned from medial to lateral and submitted entirely in 6 cassettes.  (AK  10/07/2019)    Final Diagnosis performed by Thressa Sheller, MD.   Electronically signed  10/10/2019  Technical and / or Professional components performed at [facility]. [facility], [place] [address], [place], [place] [address].   Immunohistochemistry Technical component (if applicable) was performed  at [facility]. [address], [place],  [place], [place] [address].   IMMUNOHISTOCHEMISTRY DISCLAIMER (if applicable):  Some of these immunohistochemical stains may have been developed and the  performance characteristics determine by [facility]. Some  may not have been cleared or approved by the U.S. Food and Drug  Administration. The FDA has determined that such clearance or approval  is not necessary. This test is used for clinical purposes. It should not  be regarded as investigational or for research. This laboratory is  certified under the [place]  (CLIA-88) as qualified to perform high complexity clinical laboratory  testing.  The controls stained appropriately.         SAFETY ISSUES: Prior radiation? No Pacemaker/ICD? No Possible current pregnancy? No Is the patient on methotrexate? Yes--took last injection yesterday 07/06/2019     Vs   wt

## 2019-11-16 ENCOUNTER — Encounter: Payer: Self-pay | Admitting: *Deleted

## 2019-11-20 NOTE — Progress Notes (Signed)
Radiation Oncology         ([phone]) [phone] ________________________________  Name: [name] MRN: [id number]  Date: 11/21/2019  DOB: 05/23/1960  Re-Evaluation Note  CC: Ladell Pier, MD  Magrinat, Virgie Dad, MD    ICD-10-CM   1. Malignant neoplasm of upper-outer quadrant of left breast in female, estrogen receptor positive ([place])  C50.412    Z17.0   2. [name]  D05.12 Ambulatory referral to Social Work    Diagnosis: Stage 0, Left Breast UOQ, DCIS, ER+ / PR+, Grade 2  Stage pT1c, pN0 Posterior Left Breast UOQ, Invasive Ductal Carcinoma, ER+ / PR+ / Her2-, Grade 1  Narrative:  The patient returns post-operatively today to discuss radiation treatment options. She was seen in consultation on 07/07/2019. At that time, she appeared to be a good candidate for breast conservation with anticipated surgery and adjuvant radiation therapy. Later that day, she was seen in consultation with Dr. [name]. It was recommended that she proceed with definitive surgery followed by adjuvant radiation and then anti-estrogens.  Of note, the initial diagnostic mammogram/ultrasound report noted additional calcifications within the left breast (in addition to the largest group of calcifications that was recommended for biopsy) with recommendation of possible additional stereotactic biopsy of the additional calcifications if the largest group turned out to be abnormal. This was performed on 07/07/2019 and revealed unremarkable fibroadipose tissue of the left upper central breast. However, the tissue was discordant and the area remained concerning for invasive disease.  Given the additional small group of similar appearing calcifications that were nearly 2 cm anterior to the biopsied malignancy, an additional stereotactic-guided biopsy was performed on 07/22/2019. Biopsy of the posterior UOQ of the left breast revealed grade 1 invasive ductal carcinoma with atypical lobular  hyperplasia and calcifications. Prognostic indicators were significant for Estrogen Receptor 95% positive and Progesterone Receptor 2% positive, both with strong staining intensities. Proliferation Marker Ki67 was 1% and Her2 was negative. Biopsy of the anterior UOQ of the left breast revealed fibrocystic change and calcifications.   Left axillary ultrasound also performed on 07/22/2019 showed an abnormal anterior left axillary lymph node with cortical thickening of 5 mm. There was an adjacent borderline abnormal more superficial axillary lymph node.  MRI of the left breast performed on 08/02/2019 showed post-biopsy changes and a biopsy marking clip at the site of the previously seen enhancing mass in the superior left breast.  The patient underwent a left breast lumpectomy (seed x2) with deep left axillary sentinel lymph node biopsy on 08/30/2019 that was performed by Dr. [name]. Pathology from the procedure revealed invasive ductal carcinoma and ducal carcinoma in-situ with calcifications. The invasive carcinoma was 0.4 cm from the posterior margin whereas the DCIS focally involved the inferior margin. There were also noted to be fibrocystic changes with sclerosing adenosis and calcifications. Excision of the additional left medial and superior margins revealed fibrocystic changes with sclerosing adenosis and calcifications. No malignancy was identified in those margins and the final margins were clear. Three left axillary sentinel lymph nodes were biopsied and all were negative for metastatic carcinoma.  The patient then underwent a re-excision of the left inferior margin on 10/07/2019 that was performed by Dr. [name]. Pathology from the procedure revealed fibrocystic changes with apocrine metaplasia, benign breast tissue with calcifications, and previous surgical site changes. There was no residual carcinoma identified.  Oncotype DX was obtained on the final surgical sample and the recurrence score of 15  predicts a  risk of recurrence outside the breast over the next 9 years of 4%, if the patient's only systemic therapy is an antiestrogen for 5 years. It also predicted no significant benefit from chemotherapy.  On review of systems, the patient reports no complaints. She denies breast pain and any other symptoms.    Allergies:  is allergic to other.  Meds: Current Outpatient Medications  Medication Sig Dispense Refill   albuterol (VENTOLIN HFA) 108 (90 Base) MCG/ACT inhaler Inhale 2 puffs into the lungs every 6 (six) hours as needed for wheezing or shortness of breath. 18 g 2   Cholecalciferol (VITAMIN D3) 75 MCG (3000 UT) TABS Take 3,000 Units by mouth daily.     cyanocobalamin 100 MCG tablet Take 100 mcg by mouth daily.     ENBREL MINI 50 MG/ML SOCT INJECT 50 MG INTO THE SKIN ONCE A WEEK. 12 mL 0   folic acid (FOLVITE) 1 MG tablet Take 2 tablets (2 mg total) by mouth daily. 180 tablet 3   Methotrexate, PF, (OTREXUP) 20 MG/0.4ML SOAJ Inject 20 mg into the skin once a week. 12 pen 0   Multiple Vitamin (MULTIVITAMIN WITH MINERALS) TABS tablet Take 1 tablet by mouth daily.     nicotine (NICODERM CQ - DOSED IN MG/24 HOURS) 14 mg/24hr patch PLACE 1 PATCH (14 MG TOTAL) ONTO THE SKIN DAILY. 28 patch 1   predniSONE (DELTASONE) 1 MG tablet Take 1 mg by mouth daily as needed (strength boost).     SYMBICORT 80-4.5 MCG/ACT inhaler TAKE 2 PUFFS BY MOUTH TWICE A DAY 10.2 Inhaler 3   traMADol (ULTRAM) 50 MG tablet Take 1 tablet (50 mg total) by mouth every 12 (twelve) hours as needed. (Patient taking differently: Take 50 mg by mouth every 12 (twelve) hours as needed (pain). ) 30 tablet 0   HYDROcodone-acetaminophen (NORCO/VICODIN) 5-325 MG tablet Take 1-2 tablets by mouth every 6 (six) hours as needed for moderate pain or severe pain. (Patient not taking: Reported on 11/21/2019) 15 tablet 0   HYDROcodone-acetaminophen (NORCO/VICODIN) 5-325 MG tablet Take 1-2 tablets by mouth every 6 (six) hours as  needed for moderate pain or severe pain. (Patient not taking: Reported on 11/21/2019) 15 tablet 0   No current facility-administered medications for this encounter.    Physical Findings: The patient is in no acute distress. Patient is alert and oriented.  weight is 97 lb (44 kg). Her temperature is 98.4 F (36.9 C). Her blood pressure is 110/77 and her pulse is 70. Her respiration is 18 and oxygen saturation is 100%.  No significant changes. Lungs are clear to auscultation bilaterally. Heart has regular rate and rhythm. No palpable cervical, supraclavicular, or axillary adenopathy. Abdomen soft, non-tender, normal bowel sounds. Right breast: no palpable mass, nipple discharge or bleeding. Left breast: Lumpectomy scar well-healed.  Patient also has a separate axillary scar which is healed well.  Lab Findings: Lab Results  Component Value Date   WBC 7.4 08/26/2019   HGB 16.2 (H) 08/26/2019   HCT 49.3 (H) 08/26/2019   MCV 89.3 08/26/2019   PLT 236 08/26/2019    Radiographic Findings: No results found.  Impression: Stage 0, Left Breast UOQ, DCIS, ER+ / PR+, Grade 2  Stage pT1c, pN0 Posterior Left Breast UOQ, Invasive Ductal Carcinoma, ER+ / PR+ / Her2-, Grade 1  Patient would be a good candidate for adjuvant radiation therapy as breast conserving surgery.  I discussed the general course of treatment side effects and potential toxicities of radiation therapy in this  situation with the patient.  She appears to understand and wishes to proceed with radiation therapy as recommended.  Plan: The patient is scheduled for CT simulation tomorrow.  She will.  Be a good candidate for hypofractionated accelerated radiation therapy over approximately 4 weeks.  Will use cardiac sparing techniques if necessary.  Total time spent in this encounter was 35 minutes which included reviewing the patient's most recent consultations, left breast MRI, left axillary ultrasound, lumpectomy, re-excision, oncotype  DX, biopsies and pathology reports, physical examination, and documentation.  -----------------------------------  Blair Promise, PhD, MD  This document serves as a record of services personally performed by Gery Pray, MD. It was created on his behalf by Clerance Lav, a trained medical scribe. The creation of this record is based on the scribe's personal observations and the provider's statements to them. This document has been checked and approved by the attending provider.

## 2019-11-21 ENCOUNTER — Encounter: Payer: Self-pay | Admitting: *Deleted

## 2019-11-21 ENCOUNTER — Ambulatory Visit
Admission: RE | Admit: 2019-11-21 | Discharge: 2019-11-21 | Disposition: A | Discharge status: Home / Self Care | Payer: Medicare Other | Source: Ambulatory Visit | Attending: Radiation Oncology | Admitting: Radiation Oncology

## 2019-11-21 ENCOUNTER — Other Ambulatory Visit: Payer: Self-pay

## 2019-11-21 ENCOUNTER — Encounter: Payer: Self-pay | Admitting: Radiation Oncology

## 2019-11-21 VITALS — BP 110/77 | HR 70 | Temp 98.4°F | Resp 18 | Wt 97.0 lb

## 2019-11-21 DIAGNOSIS — Z79899 Other long term (current) drug therapy: Secondary | ICD-10-CM | POA: Insufficient documentation

## 2019-11-21 DIAGNOSIS — C50412 Malignant neoplasm of upper-outer quadrant of left female breast: Secondary | ICD-10-CM | POA: Diagnosis present

## 2019-11-21 DIAGNOSIS — Z170 Estrogen receptor positive status [ER+]: Secondary | ICD-10-CM | POA: Diagnosis not present

## 2019-11-21 DIAGNOSIS — Z923 Personal history of irradiation: Secondary | ICD-10-CM | POA: Insufficient documentation

## 2019-11-21 DIAGNOSIS — D0512 Intraductal carcinoma in situ of left breast: Secondary | ICD-10-CM

## 2019-11-21 NOTE — Progress Notes (Signed)
Patient here for a f/u new consult with Dr. Sondra Come.   History of Present Illness  The patient is a 59 year old female who presents for a follow-up for Breast cancer. The patient is a 59 year old white female who is about 3 weeks status post left breast lumpectomy and sentinel node biopsy for a T1 cN0 left breast cancer that was ER and PR positive and HER-2 negative with a Ki-67 of 1%.  She tolerated the surgery well.   10/07/2019   8:18 AM   PATIENT:  [name]  59 y.o. female   PRE-OPERATIVE DIAGNOSIS:  LEFT BREAST CANCER   POST-OPERATIVE DIAGNOSIS:  LEFT BREAST CANCER   PROCEDURE:  Procedure(s): RE-EXCISION OF LEFT BREAST INFERIOR MARGIN (Left)   SURGEON:  Surgeon(s) and Role:    * [name], MD - Primary      Status:  Edited Result - FINAL  Visible to patient:  No (inaccessible in MyChart)  Next appt:  10/24/2019 at 08:30 AM in Radiation Oncology ([place]) 0 Result Notes Component 2 wk ago  SURGICAL PATHOLOGY SURGICAL PATHOLOGY  CASE: [phone]  PATIENT: [name]  Surgical Pathology Report      Clinical History: left breast cancer (cm)    FINAL MICROSCOPIC DIAGNOSIS:   A. BREAST, LEFT NEW INFERIOR MARGIN, EXCISION:  -  Fibrocystic changes with apocrine metaplasia  -  Benign breast tissue with calcifications  -  Previous surgical site changes present  -  No residual carcinoma identified   GROSS DESCRIPTION:   Received fresh and placed in formalin at 10:20 AM is a 4.5 x 2.5 x 0.6  cm portion of tan-pink firm tissue, with a short stitch at the new  inferior margin and long suture at the medial margin.  The new inferior  margin is inked black.  All other margins are inked yellow.  Specimen  sectioned from medial to lateral and submitted entirely in 6 cassettes.  (AK 10/07/2019)    Final Diagnosis performed by Thressa Sheller, MD.   Electronically signed  10/10/2019  Technical and / or Professional components performed at [facility]. [facility], [place] [address], [place], [place] [address].   Immunohistochemistry Technical component (if applicable) was performed  at [facility]. [address] [place],  [place], [place] [address].   IMMUNOHISTOCHEMISTRY DISCLAIMER (if applicable):  Some of these immunohistochemical stains may have been developed and the  performance characteristics determine by [facility]. Some  may not have been cleared or approved by the U.S. Food and Drug  Administration. The FDA has determined that such clearance or approval  is not necessary. This test is used for clinical purposes. It should not  be regarded as investigational or for research. This laboratory is  certified under the [place]  (CLIA-88) as qualified to perform high complexity clinical laboratory  testing.  The controls stained appropriately.         SAFETY ISSUES: Prior radiation? No Pacemaker/ICD? No Possible current pregnancy? No Is the patient on methotrexate? Yes--took last injection yesterday 11/19/19        Wt Readings from Last 3 Encounters:  11/21/19 97 lb (44 kg)  10/07/19 98 lb 1.7 oz (44.5 kg)  08/30/19 98 lb 12.3 oz (44.8 kg)

## 2019-11-22 ENCOUNTER — Other Ambulatory Visit: Payer: Self-pay

## 2019-11-22 ENCOUNTER — Ambulatory Visit
Admission: RE | Admit: 2019-11-22 | Discharge: 2019-11-22 | Disposition: A | Discharge status: Home / Self Care | Payer: Medicare Other | Source: Ambulatory Visit | Attending: Radiation Oncology | Admitting: Radiation Oncology

## 2019-11-22 DIAGNOSIS — Z170 Estrogen receptor positive status [ER+]: Secondary | ICD-10-CM

## 2019-11-22 DIAGNOSIS — C50412 Malignant neoplasm of upper-outer quadrant of left female breast: Secondary | ICD-10-CM | POA: Diagnosis present

## 2019-11-23 ENCOUNTER — Encounter: Payer: Self-pay | Admitting: General Practice

## 2019-11-23 NOTE — Progress Notes (Signed)
[place] Psychosocial Distress Screening Clinical Social Work  Clinical Social Work was referred by distress screening protocol.  The patient scored a 5 on the Psychosocial Distress Thermometer which indicates moderate distress. Clinical Social Worker contacted patient by phone to assess for distress and other psychosocial needs. Call to patient to assess for needs/resources, no answer, left VM w detailed information on Support Center and encouragement to return call.    ONCBCN DISTRESS SCREENING 11/21/2019  Screening Type Initial Screening  Distress experienced in past week (1-10) 5  Referral to clinical social work Yes  Other reports staying with a friend but is homeless, yes she is open to the social work referral    Clinical Social Worker follow up needed: Yes.    Await return call  If yes, follow up plan:  Beverely Pace, [place], LCSW Clinical Social Worker Phone:  [phone]

## 2019-11-25 ENCOUNTER — Inpatient Hospital Stay: Payer: Medicare Other | Attending: Oncology

## 2019-11-25 ENCOUNTER — Other Ambulatory Visit: Payer: Self-pay

## 2019-11-25 ENCOUNTER — Encounter: Payer: Self-pay | Admitting: General Practice

## 2019-11-25 ENCOUNTER — Inpatient Hospital Stay (HOSPITAL_BASED_OUTPATIENT_CLINIC_OR_DEPARTMENT_OTHER): Payer: Medicare Other | Admitting: Oncology

## 2019-11-25 VITALS — BP 109/98 | HR 64 | Temp 97.4°F | Resp 18 | Ht 65.0 in | Wt 98.7 lb

## 2019-11-25 DIAGNOSIS — Z590 Homelessness: Secondary | ICD-10-CM | POA: Diagnosis not present

## 2019-11-25 DIAGNOSIS — Z803 Family history of malignant neoplasm of breast: Secondary | ICD-10-CM | POA: Diagnosis not present

## 2019-11-25 DIAGNOSIS — Z170 Estrogen receptor positive status [ER+]: Secondary | ICD-10-CM | POA: Insufficient documentation

## 2019-11-25 DIAGNOSIS — M069 Rheumatoid arthritis, unspecified: Secondary | ICD-10-CM | POA: Insufficient documentation

## 2019-11-25 DIAGNOSIS — F17210 Nicotine dependence, cigarettes, uncomplicated: Secondary | ICD-10-CM | POA: Diagnosis not present

## 2019-11-25 DIAGNOSIS — C50412 Malignant neoplasm of upper-outer quadrant of left female breast: Secondary | ICD-10-CM

## 2019-11-25 DIAGNOSIS — J449 Chronic obstructive pulmonary disease, unspecified: Secondary | ICD-10-CM | POA: Diagnosis not present

## 2019-11-25 DIAGNOSIS — D0512 Intraductal carcinoma in situ of left breast: Secondary | ICD-10-CM | POA: Diagnosis not present

## 2019-11-25 LAB — CMP (CANCER CENTER ONLY)
ALT: 13 U/L (ref 0–44)
AST: 15 U/L (ref 15–41)
Albumin: 3.7 g/dL (ref 3.5–5.0)
Alkaline Phosphatase: 92 U/L (ref 38–126)
Anion gap: 7 (ref 5–15)
BUN: 21 mg/dL (ref 8–23)
CO2: 30 mmol/L (ref 22–32)
Calcium: 9.5 mg/dL (ref 8.9–10.3)
Chloride: 107 mmol/L (ref 98–111)
Creatinine: 0.88 mg/dL (ref 0.44–1.00)
GFR, Est AFR Am: >60 mL/min (ref >60)
GFR, Estimated: >60 mL/min (ref >60)
Glucose, Bld: 76 mg/dL (ref 70–99)
Potassium: 4.7 mmol/L (ref 3.5–5.1)
Sodium: 144 mmol/L (ref 135–145)
Total Bilirubin: 0.3 mg/dL (ref 0.3–1.2)
Total Protein: 7.1 g/dL (ref 6.5–8.1)

## 2019-11-25 LAB — CBC WITH DIFFERENTIAL (CANCER CENTER ONLY)
Abs Immature Granulocytes: 0.01 10*3/uL (ref 0.00–0.07)
Basophils Absolute: 0 10*3/uL (ref 0.0–0.1)
Basophils Relative: 1 %
Eosinophils Absolute: 0.1 10*3/uL (ref 0.0–0.5)
Eosinophils Relative: 3 %
HCT: 46 % (ref 36.0–46.0)
Hemoglobin: 15.1 g/dL — ABNORMAL HIGH (ref 12.0–15.0)
Immature Granulocytes: 0 %
Lymphocytes Relative: 37 %
Lymphs Abs: 1.6 10*3/uL (ref 0.7–4.0)
MCH: 29.5 pg (ref 26.0–34.0)
MCHC: 32.8 g/dL (ref 30.0–36.0)
MCV: 90 fL (ref 80.0–100.0)
Monocytes Absolute: 0.3 10*3/uL (ref 0.1–1.0)
Monocytes Relative: 8 %
Neutro Abs: 2.3 10*3/uL (ref 1.7–7.7)
Neutrophils Relative %: 51 %
Platelet Count: 212 10*3/uL (ref 150–400)
RBC: 5.11 MIL/uL (ref 3.87–5.11)
RDW: 13.5 % (ref 11.5–15.5)
WBC Count: 4.4 10*3/uL (ref 4.0–10.5)
nRBC: 0 % (ref 0.0–0.2)

## 2019-11-25 NOTE — Progress Notes (Signed)
[name]  [phone] [phone]     ID: [name] DOB: September 18, 1960  MR#: [id number]  JYN#:[id number]  Patient Care Team: Ladell Pier, MD as PCP - General (Internal Medicine) Marbeth Smedley, Virgie Dad, MD as Consulting Physician (Oncology) [name], MD as Consulting Physician (General Surgery) Bo Merino, MD as Consulting Physician (Rheumatology) Mauro Kaufmann, RN as Oncology Nurse Navigator Rockwell Germany, RN as Oncology Nurse Navigator Chauncey Cruel, MD OTHER MD:  CHIEF COMPLAINT: estrogen receptor positive noninvasive breast cancer  CURRENT TREATMENT: Adjuvant radiation   INTERVAL HISTORY: [name] returns today for follow up of her noninvasive breast cancer. She was evaluated in the breast cancer clinic on 07/07/2019.   Since consultation, she underwent additional left breast biopsies on 07/22/2019 showing: 1. Posterior UOQ  - invasive ductal carcinoma, grade 1  - prognostic panel: estrogen receptor 95% positive; progesterone receptor 2% positive, both with strong staining intensity. Proliferation marker Ki67 of 1%. Her2 negative by immunohistochemistry (1+).  - atypical lobular hyperplasia 2. Anterior UOQ  - fibrocystic change  - calcifications  She underwent left breast MRI on 08/02/2019 to ensure correct biopsy site. These confirmed correct biopsy marker locations.  She proceeded to left lumpectomy on 08/30/2019 under Dr. [name]. Pathology from the procedure ([id number]) showed: invasive ductal carcinoma, grade 1, 1.5 cm; ductal carcinoma in situ with calcifications; DCIS focally involves inferior margin.  All 4 biopsied lymph nodes were negative for carcinoma (0/4).  Oncotype DX was obtained on the final surgical sample and the recurrence score of 15 predicts a risk of recurrence outside the breast over the next 9 years of 4%, if the patient's only systemic therapy is an antiestrogen for 5 years.  It also predicts  no benefit from chemotherapy.  She underwent re-excision of the positive margin on 10/07/2019. Pathology from the procedure [id number]) showed no residual carcinoma.  She was scheduled to meet with Dr. Sondra Come, but she failed to show for the first two scheduled appointments. She finally met with him on 11/21/2019 and proceeded with CT simulation the following day. She is scheduled to receive treatment from 11/30/2019 through 12/27/2019.   REVIEW OF SYSTEMS: [name] says she is doing "okay".  She remains "homeless", staying with a friend actually and what sounds like a portion of a farmhouse.  She says it is safe there but they use a wood stove and it is quite cold.  It is also not very clean she says.  At present she has enough transportation to get through radiation but she has met with one of our social workers and is aware that we do have some help in that regard if she needs it.  A detailed review of systems was otherwise stable   HISTORY OF CURRENT ILLNESS: From the original intake note:  [name] had routine screening mammography on 05/10/2019 showing a possible abnormality in the left breast. She underwent left diagnostic mammography with tomography at [place] on 05/27/2019 showing: breast density category C; indeterminate 0.9 cm left breast calcifications.  There was no axillary ultrasonography.  Accordingly on 06/13/2019 she proceeded to biopsy of the left breast area in question. The pathology from this procedure ([id number]) showed: ductal carcinoma in situ, intermediate grade. Prognostic indicators significant for: estrogen receptor, 100% positive and progesterone receptor, 60% positive, both with strong staining intensity.   She underwent breast MRI on 06/24/2019 showing: breast composition D; suspicious 8 mm enhancing mass located approximately 2.5 cm superior and medial  to biopsy site; no evidence of right breast malignancy; no suspicious lymphadenopathy within limits of the  study.  MRI biopsy of the enhancing mass in the upper central left breast was performed today, with results pending.  The patient's subsequent history is as detailed below.   PAST MEDICAL HISTORY: Past Medical History:  Diagnosis Date  . Anemia    during pregnancy  . Anxiety   . Asthma   . Bronchitis age 59  . Cancer ([place])    left breast ca  . COPD (chronic obstructive pulmonary disease) ([place])   . Depression   . Hx of bladder infections    been over 5 years since last one   . Rheumatoid arthritis ([place])    unc rheum   . Tobacco abuse   . Trouble in sleeping     PAST SURGICAL HISTORY: Past Surgical History:  Procedure Laterality Date  . BREAST LUMPECTOMY WITH RADIOACTIVE SEED AND SENTINEL LYMPH NODE BIOPSY Left 08/30/2019   Procedure: LEFT BREAST LUMPECTOMY X 2 WITH RADIOACTIVE SEED AND SENTINEL LYMPH NODE BIOPSY;  Surgeon: [name], MD;  Location: [place];  Service: General;  Laterality: Left;  PEC BLOCK  . COLONOSCOPY WITH PROPOFOL N/A 03/15/2019   Procedure: COLONOSCOPY WITH PROPOFOL;  Surgeon: Jonathon Bellows, MD;  Location[facility] END[facility]COPY;  Service: Gastroenterology;  Laterality: N/A;  . HIP ARTHROPLASTY    . JOINT REPLACEMENT    . RE-EXCISION OF BREAST LUMPECTOMY Left 10/07/2019   Procedure: RE-EXCISION OF LEFT BREAST INFERIOR MARGIN;  Surgeon: [name], MD;  Location: [place];  Service: General;  Laterality: Left;  . TOTAL HIP ARTHROPLASTY Left 02/15/2018   Procedure: TOTAL HIP ARTHROPLASTY ANTERIOR APPROACH;  Surgeon: [name], MD;  Location[facility];  Service: Orthopedics;  Laterality: Left;  . WISDOM TOOTH EXTRACTION      FAMILY HISTORY: Family History  Problem Relation Age of Onset  . Breast cancer Mother   . Cervical cancer Mother   . Cancer Brother   . Healthy Son   The patient's father died in his 77s.  The patient tells me she essentially has no information on him or his side of the family.  The patient's mother was diagnosed with  breast cancer age 614 and died age 98.  She also had a history of uterine cancer.  The patient's mother's mother died at age 87 and developed cancer at age 37.  She does not know what kind of cancer this was.  The patient's mother had 5 sisters and 5 brothers.  1 brother had lung cancer and died at age 83 from it, in the setting of tobacco abuse.  The patient tells me she has 68 Cousins and that the son of one of her cousins has died from cancer but she does not know the type. ADDENDUM: The patient tells me she has found out that her great aunt and and and on her mother side both had breast cancer.   GYNECOLOGIC HISTORY:  No LMP recorded. Patient is postmenopausal. Menarche: 59 years old Age at first live birth: 59 years old [place] P 1 LMP 62 Contraceptive intermittently, with no complications HRT no  Hysterectomy?  No BSO?  No   SOCIAL HISTORY: (updated 06/2019)  Devany describes herself as homeless.  She is currently staying with friends.  She is on disability secondary to her hip problems.  Her son [name] works in Biomedical scientist and is also a Furniture conservator/restorer.  The patient has 2 step grandchildren aged 21  and 44, the 45 year old being special needs.  The patient describes herself as spiritual and nondenominational   ADVANCED DIRECTIVES: At the 11/25/2019 visit the patient was given the appropriate documents to complete and notarized at her discretion   HEALTH MAINTENANCE: Social History   Tobacco Use  . Smoking status: Current Some Day Smoker    Packs/day: 0.25    Types: Cigarettes    Last attempt to quit: 09/08/2019    Years since quitting: 0.2  . Smokeless tobacco: Never Used  . Tobacco comment: on nicotine patches, rarely smokes  Vaping Use  . Vaping Use: Never used  Substance Use Topics  . Alcohol use: Yes    Comment: rarely   . Drug use: Not Currently    Types: Cocaine    Comment: last used in 2004     Colonoscopy: 03/2019 (Dr. Vicente Males), repeat 2024  PAP: 03/2018, negative  Bone  density: none on file   Allergies  Allergen Reactions  . Other Rash    Unknown cleaner used in OR    Current Outpatient Medications  Medication Sig Dispense Refill  . albuterol (VENTOLIN HFA) 108 (90 Base) MCG/ACT inhaler Inhale 2 puffs into the lungs every 6 (six) hours as needed for wheezing or shortness of breath. 18 g 2  . Cholecalciferol (VITAMIN D3) 75 MCG (3000 UT) TABS Take 3,000 Units by mouth daily.    . cyanocobalamin 100 MCG tablet Take 100 mcg by mouth daily.    . ENBREL MINI 50 MG/ML SOCT INJECT 50 MG INTO THE SKIN ONCE A WEEK. 12 mL 0  . folic acid (FOLVITE) 1 MG tablet Take 2 tablets (2 mg total) by mouth daily. 180 tablet 3  . HYDROcodone-acetaminophen (NORCO/VICODIN) 5-325 MG tablet Take 1-2 tablets by mouth every 6 (six) hours as needed for moderate pain or severe pain. (Patient not taking: Reported on 11/21/2019) 15 tablet 0  . HYDROcodone-acetaminophen (NORCO/VICODIN) 5-325 MG tablet Take 1-2 tablets by mouth every 6 (six) hours as needed for moderate pain or severe pain. (Patient not taking: Reported on 11/21/2019) 15 tablet 0  . Methotrexate, PF, (OTREXUP) 20 MG/0.4ML SOAJ Inject 20 mg into the skin once a week. 12 pen 0  . Multiple Vitamin (MULTIVITAMIN WITH MINERALS) TABS tablet Take 1 tablet by mouth daily.    . nicotine (NICODERM CQ - DOSED IN MG/24 HOURS) 14 mg/24hr patch PLACE 1 PATCH (14 MG TOTAL) ONTO THE SKIN DAILY. 28 patch 1  . predniSONE (DELTASONE) 1 MG tablet Take 1 mg by mouth daily as needed (strength boost).    . SYMBICORT 80-4.5 MCG/ACT inhaler TAKE 2 PUFFS BY MOUTH TWICE A DAY 10.2 Inhaler 3  . traMADol (ULTRAM) 50 MG tablet Take 1 tablet (50 mg total) by mouth every 12 (twelve) hours as needed. (Patient taking differently: Take 50 mg by mouth every 12 (twelve) hours as needed (pain). ) 30 tablet 0   No current facility-administered medications for this visit.    OBJECTIVE: White woman in no acute distress  Vitals:   11/25/19 1540  BP: (!) 109/98   Pulse: 64  Resp: 18  Temp: (!) 97.4 F (36.3 C)  SpO2: 99%     Body mass index is 16.42 kg/m.   Wt Readings from Last 3 Encounters:  11/25/19 98 lb 11.2 oz (44.8 kg)  11/21/19 97 lb (44 kg)  10/07/19 98 lb 1.7 oz (44.5 kg)      ECOG FS:1 - Symptomatic but completely ambulatory  Sclerae unicteric, EOMs intact Wearing  a mask No cervical or supraclavicular adenopathy Lungs no rales or rhonchi Heart regular rate and rhythm Abd soft, nontender, positive bowel sounds MSK no focal spinal tenderness, no upper extremity lymphedema Neuro: nonfocal, well oriented, appropriate affect Breasts: The left breast is status post lumpectomy.  The cosmetic result is good.  There is no dehiscence erythema or swelling.  Both axillae are benign.   LAB RESULTS:  CMP     Component Value Date/Time   NA 144 11/25/2019 1521   NA 142 01/15/2018 1509   K 4.7 11/25/2019 1521   CL 107 11/25/2019 1521   CO2 30 11/25/2019 1521   GLUCOSE 76 11/25/2019 1521   BUN 21 11/25/2019 1521   BUN 16 01/15/2018 1509   CREATININE 0.88 11/25/2019 1521   CREATININE 0.96 05/03/2019 1557   CALCIUM 9.5 11/25/2019 1521   PROT 7.1 11/25/2019 1521   PROT 6.8 01/15/2018 1509   ALBUMIN 3.7 11/25/2019 1521   ALBUMIN 4.3 01/15/2018 1509   AST 15 11/25/2019 1521   ALT 13 11/25/2019 1521   ALKPHOS 92 11/25/2019 1521   BILITOT 0.3 11/25/2019 1521   GFRNONAA >60 11/25/2019 1521   GFRNONAA 64 05/03/2019 1557   GFRAA >60 11/25/2019 1521   GFRAA 75 05/03/2019 1557    Lab Results  Component Value Date   ALBUMINELP 4.1 05/11/2018   A1GS 0.4 (H) 05/11/2018   A2GS 0.8 05/11/2018   BETS 0.5 05/11/2018   BETA2SER 0.4 05/11/2018   GAMS 0.8 05/11/2018   SPEI  05/11/2018     Comment:     . A poorly-defined band of restricted protein mobility is detected in the gamma globulins. It is unlikely that this may represent a monoclonal protein; however, immunofixation analysis is available if clinically indicated. .      Lab Results  Component Value Date   WBC 4.4 11/25/2019   NEUTROABS 2.3 11/25/2019   HGB 15.1 (H) 11/25/2019   HCT 46.0 11/25/2019   MCV 90.0 11/25/2019   PLT 212 11/25/2019    No results found for: LABCA2  No components found for: FKCLEX517  No results for input(s): INR in the last 168 hours.  No results found for: LABCA2  No results found for: GYF749  No results found for: SWH675  No results found for: FFM384  No results found for: CA2729  No components found for: HGQUANT  No results found for: CEA1 / No results found for: CEA1   No results found for: AFPTUMOR  No results found for: CHROMOGRNA  No results found for: KPAFRELGTCHN, LAMBDASER, KAPLAMBRATIO (kappa/lambda light chains)  No results found for: HGBA, HGBA2QUANT, HGBFQUANT, HGBSQUAN (Hemoglobinopathy evaluation)   No results found for: LDH  No results found for: IRON, TIBC, IRONPCTSAT (Iron and TIBC)  No results found for: FERRITIN  Urinalysis    Component Value Date/Time   COLORURINE YELLOW 05/11/2018 0957   APPEARANCEUR CLEAR 05/11/2018 0957   LABSPEC 1.015 05/11/2018 0957   PHURINE < OR = 5.0 05/11/2018 0957   GLUCOSEU NEGATIVE 05/11/2018 0957   HGBUR NEGATIVE 05/11/2018 0957   BILIRUBINUR NEGATIVE 02/08/2018 1545   [place] 05/11/2018 0957   PROTEINUR NEGATIVE 05/11/2018 0957   NITRITE NEGATIVE 05/11/2018 0957   LEUKOCYTESUR NEGATIVE 05/11/2018 0957     STUDIES: No results found.   ELIGIBLE FOR AVAILABLE RESEARCH PROTOCOL: Not a COMET candidate given presence of a mass  ASSESSMENT: 59 y.o. [place], [place] woman status post left breast biopsy 06/13/2019 for ductal carcinoma in situ, grade 2, estrogen and  progesterone receptor positive.  (a) MRI biopsy of a 0.8 cm left breast mass 07/07/2019, results pending  (1) status post left lumpectomy and sentinel lymph node sampling 08/30/2019 for a pT1c pN0, stage IA invasive ductal carcinoma, grade 1, with DCIS focally  involving the inferior margin  (a) a total of 4 left axillary lymph nodes were removed.   (b) inferior margin successfully cleared with additional surgery 10/07/2019  (2) Oncotype score of 15 predicts a risk of breast cancer recurrence outside the breast in the next 9 years of 4% if the patient's only systemic therapy is antiestrogens.  It predicts no benefit from chemotherapy.  (3) adjuvant radiation ongoing  (4) antiestrogens at the end of local treatment   PLAN: Jaqulyn did well with her surgery and after some delays is now scheduled to start adjuvant radiation next week.  I am going to see her on the last day of radiation and we will likely start tamoxifen at that time  I continue to be concerned about her social situation.  She is working with one of our social workers and hopefully will be in a better situation before winter comes.  She has identified 2 maternal relatives with breast cancer.  This qualifies her for genetics testing.  I will alert our genetics counselors.  Total encounter time 25 minutes.Sarajane Jews C. Tadarius Maland, MD 11/25/2019 8:20 PM Medical Oncology and Hematology [facility] [place], [place] [address] Tel. [phone]    Fax. [phone]   This document serves as a record of services personally performed by Lurline Del, MD. It was created on his behalf by Wilburn Mylar, a trained medical scribe. The creation of this record is based on the scribe's personal observations and the provider's statements to them.   I, Lurline Del MD, have reviewed the above documentation for accuracy and completeness, and I agree with the above.   *Total Encounter Time as defined by the Centers for Medicare and Medicaid Services includes, in addition to the face-to-face time of a patient visit (documented in the note above) non-face-to-face time: obtaining and reviewing outside history, ordering and reviewing medications, tests or procedures,  care coordination (communications with other health care professionals or caregivers) and documentation in the medical record.

## 2019-11-25 NOTE — Progress Notes (Addendum)
[place] CSW Progress Notes  Third call to patient to address concerns on Distress Screen, unable to reach, left VM w request to call back.  Also noted she is here today to see oncologist - [name] and asked her to let me know if patient would be available to see while she is here.    Met briefly in exam room w patiient in exam room.  Her main concern is unstable housing.  She does have a place to stay w a friend but is concerned that it is old and unheated.  She would like to get a place of her own.  She has income from disability (rheumatoid arthritis), Medicaid and Medicare.  She is unsure whether Medicaid will continue.  She is currently living in [place], so transportation costs are high.  She anticipates a 4 week course of daily radiation.  Enrolled in [facility], gave first $50 disbursement.  Provided information on [employer], asked Geologist, engineering to follow up re [employer].  CSW Stoisits will follow patient on her return 9/20.  Edwyna Shell, LCSW Clinical Social Worker Phone:  [phone]

## 2019-11-28 ENCOUNTER — Other Ambulatory Visit: Payer: Self-pay | Admitting: Genetic Counselor

## 2019-11-28 ENCOUNTER — Other Ambulatory Visit: Payer: Self-pay | Admitting: Oncology

## 2019-11-28 ENCOUNTER — Encounter: Payer: Self-pay | Admitting: *Deleted

## 2019-11-28 ENCOUNTER — Encounter: Payer: Self-pay | Admitting: Licensed Clinical Social Worker

## 2019-11-28 DIAGNOSIS — C50412 Malignant neoplasm of upper-outer quadrant of left female breast: Secondary | ICD-10-CM

## 2019-11-28 NOTE — Progress Notes (Signed)
[place] Work  CSW submitted pt's application for [employer] including diagnosis confirmation letter. [employer] will contact patient directly regarding approval decision.   Edwinna Areola Ciana Simmon, LCSW

## 2019-11-29 DIAGNOSIS — C50412 Malignant neoplasm of upper-outer quadrant of left female breast: Secondary | ICD-10-CM | POA: Diagnosis not present

## 2019-11-30 ENCOUNTER — Other Ambulatory Visit: Payer: Self-pay

## 2019-11-30 ENCOUNTER — Ambulatory Visit
Admission: RE | Admit: 2019-11-30 | Discharge: 2019-11-30 | Disposition: A | Discharge status: Home / Self Care | Payer: Medicare Other | Source: Ambulatory Visit | Attending: Radiation Oncology | Admitting: Radiation Oncology

## 2019-11-30 DIAGNOSIS — Z170 Estrogen receptor positive status [ER+]: Secondary | ICD-10-CM

## 2019-11-30 DIAGNOSIS — C50412 Malignant neoplasm of upper-outer quadrant of left female breast: Secondary | ICD-10-CM | POA: Diagnosis not present

## 2019-11-30 MED ORDER — ALRA NON-METALLIC DEODORANT (RAD-ONC)
1.0000 "application " | Freq: Once | TOPICAL | Status: AC
  Administered 2019-11-30: 1 via TOPICAL

## 2019-11-30 MED ORDER — RADIAPLEXRX EX GEL: Freq: Once | CUTANEOUS | Status: AC

## 2019-12-01 ENCOUNTER — Ambulatory Visit
Admission: RE | Admit: 2019-12-01 | Discharge: 2019-12-01 | Disposition: A | Discharge status: Home / Self Care | Payer: Medicare Other | Source: Ambulatory Visit | Attending: Radiation Oncology | Admitting: Radiation Oncology

## 2019-12-01 DIAGNOSIS — C50412 Malignant neoplasm of upper-outer quadrant of left female breast: Secondary | ICD-10-CM | POA: Diagnosis not present

## 2019-12-02 ENCOUNTER — Ambulatory Visit: Payer: Medicare Other

## 2019-12-02 NOTE — Progress Notes (Deleted)
Office Visit Note  Patient: [name]             Date of Birth: March 21, 1960           MRN: [id number]             PCP: Ladell Pier, MD Referring: Ladell Pier, MD Visit Date: 12/16/2019 Occupation: @GUAROCC @  Subjective:  No chief complaint on file.   History of Present Illness: [name] is a 59 y.o. female ***   Activities of Daily Living:  Patient reports morning stiffness for *** {minute/hour:19697}.   Patient {ACTIONS;DENIES/REPORTS:21021675::"Denies"} nocturnal pain.  Difficulty dressing/grooming: {ACTIONS;DENIES/REPORTS:21021675::"Denies"} Difficulty climbing stairs: {ACTIONS;DENIES/REPORTS:21021675::"Denies"} Difficulty getting out of chair: {ACTIONS;DENIES/REPORTS:21021675::"Denies"} Difficulty using hands for taps, buttons, cutlery, and/or writing: {ACTIONS;DENIES/REPORTS:21021675::"Denies"}  No Rheumatology ROS completed.   PMFS History:  Patient Active Problem List   Diagnosis Date Noted  . Malignant neoplasm of upper-outer quadrant of left breast in female, estrogen receptor positive ([place]) 09/15/2019  . 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 Adenomatous colon polyp 03/16/2019  . Degenerative joint disease of shoulder region 06/08/2018  . Rheumatoid arthritis involving both hands with positive rheumatoid factor ([place]) 06/08/2018  . Rheumatoid arthritis involving both feet with positive rheumatoid factor ([place]) 06/08/2018  . 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 Status post total hip replacement, 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  . Smoker 06/08/2018  . Anxiety and depression 06/08/2018  . Family history of breast cancer 03/25/2018  . 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 Rheumatoid arthritis involving multiple sites ([place]) 01/15/2018  . Tobacco dependence 01/15/2018  . Opioid use agreement exists 01/15/2018  . Positive depression screening 01/15/2018  . Homeless 01/15/2018    Past Medical History:  Diagnosis Date  . Anemia    during  pregnancy  . Anxiety   . Asthma   . Bronchitis age 29  . Cancer ([place])    left breast ca  . COPD (chronic obstructive pulmonary disease) ([place])   . Depression   . Hx of bladder infections    been over 5 years since last one   . Rheumatoid arthritis ([place])    unc rheum   . Tobacco abuse   . Trouble in sleeping     Family History  Problem Relation Age of Onset  . Breast cancer Mother   . Cervical cancer Mother   . Cancer Brother   . Healthy Son    Past Surgical History:  Procedure Laterality Date  . BREAST LUMPECTOMY WITH RADIOACTIVE SEED AND SENTINEL LYMPH NODE BIOPSY Left 08/30/2019   Procedure: LEFT BREAST LUMPECTOMY X 2 WITH RADIOACTIVE SEED AND SENTINEL LYMPH NODE BIOPSY;  Surgeon: [name], MD;  Location: [facility];  Service: General;  Laterality: Left;  PEC BLOCK  . COLONOSCOPY WITH PROPOFOL N/A 03/15/2019   Procedure: COLONOSCOPY WITH PROPOFOL;  Surgeon: Jonathon Bellows, MD;  Location: [facility] ENDOSCOPY;  Service: Gastroenterology;  Laterality: N/A;  . HIP ARTHROPLASTY    . JOINT REPLACEMENT    . RE-EXCISION OF BREAST LUMPECTOMY Left 10/07/2019   Procedure: RE-EXCISION OF LEFT BREAST INFERIOR MARGIN;  Surgeon: [name], MD;  Location: [place];  Service: General;  Laterality: Left;  . TOTAL HIP ARTHROPLASTY Left 02/15/2018   Procedure: TOTAL HIP ARTHROPLASTY ANTERIOR APPROACH;  Surgeon: [name], MD;  [facility];  Service: Orthopedics;  Laterality: Left;  . WISDOM TOOTH EXTRACTION     Social History   Social History Narrative  . Not on file  Immunization History  Administered Date(s) Administered  . PFIZER SARS-COV-2 Vaccination 06/27/2019, 07/19/2019  . Pneumococcal Conjugate-13 07/14/2018  . Tdap 03/25/2018  . Zoster Recombinat (Shingrix) 07/14/2018     Objective: Vital Signs: There were no vitals taken for this visit.   Physical Exam   Musculoskeletal Exam: ***  CDAI Exam: CDAI Score: -- Patient Global: --; Provider Global:  -- Swollen: --; Tender: -- Joint Exam 12/16/2019   No joint exam has been documented for this visit   There is currently no information documented on the homunculus. Go to the Rheumatology activity and complete the homunculus joint exam.  Investigation: No additional findings.  Imaging: No results found.  Recent Labs: Lab Results  Component Value Date   WBC 4.4 11/25/2019   HGB 15.1 (H) 11/25/2019   PLT 212 11/25/2019   NA 144 11/25/2019   K 4.7 11/25/2019   CL 107 11/25/2019   CO2 30 11/25/2019   GLUCOSE 76 11/25/2019   BUN 21 11/25/2019   CREATININE 0.88 11/25/2019   BILITOT 0.3 11/25/2019   ALKPHOS 92 11/25/2019   AST 15 11/25/2019   ALT 13 11/25/2019   PROT 7.1 11/25/2019   ALBUMIN 3.7 11/25/2019   CALCIUM 9.5 11/25/2019   GFRAA >60 11/25/2019   QFTBGOLDPLUS NEGATIVE 05/03/2019    Speciality Comments: No specialty comments available.  Procedures:  No procedures performed Allergies: Other   Assessment / Plan:     Visit Diagnoses: No diagnosis found.  Orders: No orders of the defined types were placed in this encounter.  No orders of the defined types were placed in this encounter.   Face-to-face time spent with patient was *** minutes. Greater than 50% of time was spent in counseling and coordination of care.  Follow-Up Instructions: No follow-ups on file.   Earnestine Mealing, CMA  Note - This record has been created using Editor, commissioning.  Chart creation errors have been sought, but may not always  have been located. Such creation errors do not reflect on  the standard of medical care.

## 2019-12-05 ENCOUNTER — Ambulatory Visit
Admission: RE | Admit: 2019-12-05 | Discharge: 2019-12-05 | Disposition: A | Discharge status: Home / Self Care | Payer: Medicare Other | Source: Ambulatory Visit | Attending: Radiation Oncology | Admitting: Radiation Oncology

## 2019-12-05 ENCOUNTER — Other Ambulatory Visit: Payer: Self-pay

## 2019-12-05 DIAGNOSIS — C50412 Malignant neoplasm of upper-outer quadrant of left female breast: Secondary | ICD-10-CM | POA: Diagnosis not present

## 2019-12-06 ENCOUNTER — Ambulatory Visit: Payer: Medicare Other

## 2019-12-07 ENCOUNTER — Other Ambulatory Visit: Payer: Self-pay

## 2019-12-07 ENCOUNTER — Ambulatory Visit
Admission: RE | Admit: 2019-12-07 | Discharge: 2019-12-07 | Disposition: A | Discharge status: Home / Self Care | Payer: Medicare Other | Source: Ambulatory Visit | Attending: Radiation Oncology | Admitting: Radiation Oncology

## 2019-12-07 ENCOUNTER — Ambulatory Visit: Payer: Medicare Other

## 2019-12-07 DIAGNOSIS — C50412 Malignant neoplasm of upper-outer quadrant of left female breast: Secondary | ICD-10-CM | POA: Diagnosis not present

## 2019-12-08 ENCOUNTER — Ambulatory Visit
Admission: RE | Admit: 2019-12-08 | Discharge: 2019-12-08 | Disposition: A | Discharge status: Home / Self Care | Payer: Medicare Other | Source: Ambulatory Visit | Attending: Radiation Oncology | Admitting: Radiation Oncology

## 2019-12-08 DIAGNOSIS — C50412 Malignant neoplasm of upper-outer quadrant of left female breast: Secondary | ICD-10-CM | POA: Diagnosis not present

## 2019-12-09 ENCOUNTER — Ambulatory Visit: Payer: Medicare Other

## 2019-12-09 DIAGNOSIS — Z170 Estrogen receptor positive status [ER+]: Secondary | ICD-10-CM | POA: Insufficient documentation

## 2019-12-09 DIAGNOSIS — C50412 Malignant neoplasm of upper-outer quadrant of left female breast: Secondary | ICD-10-CM | POA: Insufficient documentation

## 2019-12-12 ENCOUNTER — Ambulatory Visit: Payer: Medicare Other

## 2019-12-13 ENCOUNTER — Ambulatory Visit
Admission: RE | Admit: 2019-12-13 | Discharge: 2019-12-13 | Disposition: A | Discharge status: Home / Self Care | Payer: Medicare Other | Source: Ambulatory Visit | Attending: Radiation Oncology | Admitting: Radiation Oncology

## 2019-12-13 ENCOUNTER — Ambulatory Visit: Payer: Medicare Other | Admitting: Radiation Oncology

## 2019-12-13 DIAGNOSIS — C50412 Malignant neoplasm of upper-outer quadrant of left female breast: Secondary | ICD-10-CM | POA: Diagnosis not present

## 2019-12-13 DIAGNOSIS — Z170 Estrogen receptor positive status [ER+]: Secondary | ICD-10-CM | POA: Diagnosis present

## 2019-12-14 ENCOUNTER — Ambulatory Visit: Payer: Medicare Other

## 2019-12-14 DIAGNOSIS — C50412 Malignant neoplasm of upper-outer quadrant of left female breast: Secondary | ICD-10-CM | POA: Diagnosis not present

## 2019-12-15 ENCOUNTER — Ambulatory Visit: Payer: Medicare Other

## 2019-12-16 ENCOUNTER — Ambulatory Visit: Payer: Medicare Other | Admitting: Rheumatology

## 2019-12-16 ENCOUNTER — Ambulatory Visit: Payer: Medicare Other

## 2019-12-19 ENCOUNTER — Ambulatory Visit
Admission: RE | Admit: 2019-12-19 | Discharge: 2019-12-19 | Disposition: A | Discharge status: Home / Self Care | Payer: Medicare Other | Source: Ambulatory Visit | Attending: Radiation Oncology | Admitting: Radiation Oncology

## 2019-12-19 DIAGNOSIS — C50412 Malignant neoplasm of upper-outer quadrant of left female breast: Secondary | ICD-10-CM | POA: Diagnosis not present

## 2019-12-20 ENCOUNTER — Inpatient Hospital Stay: Payer: Medicare Other | Admitting: Genetic Counselor

## 2019-12-20 ENCOUNTER — Inpatient Hospital Stay: Payer: Medicare Other | Attending: Oncology

## 2019-12-20 ENCOUNTER — Ambulatory Visit: Payer: Medicare Other | Admitting: Radiation Oncology

## 2019-12-20 ENCOUNTER — Ambulatory Visit: Payer: Medicare Other

## 2019-12-21 ENCOUNTER — Ambulatory Visit: Payer: Medicare Other

## 2019-12-22 ENCOUNTER — Ambulatory Visit: Payer: Medicare Other

## 2019-12-23 ENCOUNTER — Ambulatory Visit: Payer: Medicare Other

## 2019-12-26 ENCOUNTER — Ambulatory Visit: Payer: Medicare Other

## 2019-12-26 NOTE — Progress Notes (Signed)
[place]  [phone] [phone]     ID: [name] DOB: 1960/12/15  MR#: [id number]  MOQ#:[id number]  Patient Care Team: Ladell Pier, MD as PCP - General (Internal Medicine) Jasleen Riepe, Virgie Dad, MD as Consulting Physician (Oncology) [name], MD as Consulting Physician (General Surgery) Bo Merino, MD as Consulting Physician (Rheumatology) Mauro Kaufmann, RN as Oncology Nurse Navigator Rockwell Germany, RN as Oncology Nurse Navigator Beverely Pace, LCSW as Social Worker ([facility]) Chauncey Cruel, MD OTHER MD:  CHIEF COMPLAINT: estrogen receptor positive noninvasive breast cancer  CURRENT TREATMENT: Adjuvant radiation   INTERVAL HISTORY: [name] was scheduled today for follow up of her noninvasive breast cancer, however she did not show   She is currently receiving adjuvant radiation therapy under Dr. Sondra Come. She was originally scheduled to finish today, but she has inconsistently shown to her appointments.   REVIEW OF SYSTEMS: [name]    HISTORY OF CURRENT ILLNESS: From the original intake note:  [name] had routine screening mammography on 05/10/2019 showing a possible abnormality in the left breast. She underwent left diagnostic mammography with tomography at [facility] on 05/27/2019 showing: breast density category C; indeterminate 0.9 cm left breast calcifications.  There was no axillary ultrasonography.  Accordingly on 06/13/2019 she proceeded to biopsy of the left breast area in question. The pathology from this procedure ([id number]) showed: ductal carcinoma in situ, intermediate grade. Prognostic indicators significant for: estrogen receptor, 100% positive and progesterone receptor, 60% positive, both with strong staining intensity.   She underwent breast MRI on 06/24/2019 showing: breast composition D; suspicious 8 mm enhancing mass located approximately 2.5 cm superior and medial to biopsy  site; no evidence of right breast malignancy; no suspicious lymphadenopathy within limits of the study.  MRI biopsy of the enhancing mass in the upper central left breast was performed today, with results pending.  The patient's subsequent history is as detailed below.   PAST MEDICAL HISTORY: Past Medical History:  Diagnosis Date  . Anemia    during pregnancy  . Anxiety   . Asthma   . Bronchitis age 55  . Cancer ([place])    left breast ca  . COPD (chronic obstructive pulmonary disease) ([place])   . Depression   . Hx of bladder infections    been over 5 years since last one   . Rheumatoid arthritis ([place])    unc rheum   . Tobacco abuse   . Trouble in sleeping     PAST SURGICAL HISTORY: Past Surgical History:  Procedure Laterality Date  . BREAST LUMPECTOMY WITH RADIOACTIVE SEED AND SENTINEL LYMPH NODE BIOPSY Left 08/30/2019   Procedure: LEFT BREAST LUMPECTOMY X 2 WITH RADIOACTIVE SEED AND SENTINEL LYMPH NODE BIOPSY;  Surgeon: [name], MD;  Location: [facility];  Service: General;  Laterality: Left;  PEC BLOCK  . COLONOSCOPY WITH PROPOFOL N/A 03/15/2019   Procedure: COLONOSCOPY WITH PROPOFOL;  Surgeon: Jonathon Bellows, MD;  Location: [facility];  Service: Gastroenterology;  Laterality: N/A;  . HIP ARTHROPLASTY    . JOINT REPLACEMENT    . RE-EXCISION OF BREAST LUMPECTOMY Left 10/07/2019   Procedure: RE-EXCISION OF LEFT BREAST INFERIOR MARGIN;  Surgeon: [name], MD;  Location: [facility];  Service: General;  Laterality: Left;  . TOTAL HIP ARTHROPLASTY Left 02/15/2018   Procedure: TOTAL HIP ARTHROPLASTY ANTERIOR APPROACH;  Surgeon: [name], MD;  Location: [facility];  Service: Orthopedics;  Laterality: Left;  .  WISDOM TOOTH EXTRACTION      FAMILY HISTORY: Family History  Problem Relation Age of Onset  . Breast cancer Mother   . Cervical cancer Mother   . Cancer Brother   . Healthy Son   The patient's father died in his 59s.  The patient tells me she  essentially has no information on him or his side of the family.  The patient's mother was diagnosed with breast cancer age 107 and died age 67.  She also had a history of uterine cancer.  The patient's mother's mother died at age 63 and developed cancer at age 44.  She does not know what kind of cancer this was.  The patient's mother had 5 sisters and 5 brothers.  1 brother had lung cancer and died at age 32 from it, in the setting of tobacco abuse.  The patient tells me she has 67 Cousins and that the son of one of her cousins has died from cancer but she does not know the type. ADDENDUM: The patient tells me she has found out that her great aunt and and and on her mother side both had breast cancer.   GYNECOLOGIC HISTORY:  No LMP recorded. Patient is postmenopausal. Menarche: 59 years old Age at first live birth: 59 years old [place] P 1 LMP 19 Contraceptive intermittently, with no complications HRT no  Hysterectomy?  No BSO?  No   SOCIAL HISTORY: (updated 06/2019)  [name] describes herself as homeless.  She is currently staying with friends.  She is on disability secondary to her hip problems.  Her son [name] works in Biomedical scientist and is also a Furniture conservator/restorer.  The patient has 2 step grandchildren aged 59 and 32, the 89 year old being special needs.  The patient describes herself as spiritual and nondenominational   ADVANCED DIRECTIVES: At the 11/25/2019 visit the patient was given the appropriate documents to complete and notarized at her discretion   HEALTH MAINTENANCE: Social History   Tobacco Use  . Smoking status: Current Some Day Smoker    Packs/day: 0.25    Types: Cigarettes    Last attempt to quit: 09/08/2019    Years since quitting: 0.3  . Smokeless tobacco: Never Used  . Tobacco comment: on nicotine patches, rarely smokes  Vaping Use  . Vaping Use: Never used  Substance Use Topics  . Alcohol use: Yes    Comment: rarely   . Drug use: Not Currently    Types: Cocaine    Comment:  last used in 2004     Colonoscopy: 03/2019 (Dr. Vicente Males), repeat 2024  PAP: 03/2018, negative  Bone density: none on file   Allergies  Allergen Reactions  . Other Rash    Unknown cleaner used in OR    Current Outpatient Medications  Medication Sig Dispense Refill  . albuterol (VENTOLIN HFA) 108 (90 Base) MCG/ACT inhaler Inhale 2 puffs into the lungs every 6 (six) hours as needed for wheezing or shortness of breath. 18 g 2  . Cholecalciferol (VITAMIN D3) 75 MCG (3000 UT) TABS Take 3,000 Units by mouth daily.    . cyanocobalamin 100 MCG tablet Take 100 mcg by mouth daily.    . ENBREL MINI 50 MG/ML SOCT INJECT 50 MG INTO THE SKIN ONCE A WEEK. 12 mL 0  . folic acid (FOLVITE) 1 MG tablet Take 2 tablets (2 mg total) by mouth daily. 180 tablet 3  . HYDROcodone-acetaminophen (NORCO/VICODIN) 5-325 MG tablet Take 1-2 tablets by mouth every 6 (six) hours as needed  for moderate pain or severe pain. (Patient not taking: Reported on 11/21/2019) 15 tablet 0  . HYDROcodone-acetaminophen (NORCO/VICODIN) 5-325 MG tablet Take 1-2 tablets by mouth every 6 (six) hours as needed for moderate pain or severe pain. (Patient not taking: Reported on 11/21/2019) 15 tablet 0  . Methotrexate, PF, (OTREXUP) 20 MG/0.4ML SOAJ Inject 20 mg into the skin once a week. 12 pen 0  . Multiple Vitamin (MULTIVITAMIN WITH MINERALS) TABS tablet Take 1 tablet by mouth daily.    . nicotine (NICODERM CQ - DOSED IN MG/24 HOURS) 14 mg/24hr patch PLACE 1 PATCH (14 MG TOTAL) ONTO THE SKIN DAILY. 28 patch 1  . predniSONE (DELTASONE) 1 MG tablet Take 1 mg by mouth daily as needed (strength boost).    . SYMBICORT 80-4.5 MCG/ACT inhaler TAKE 2 PUFFS BY MOUTH TWICE A DAY 10.2 Inhaler 3  . traMADol (ULTRAM) 50 MG tablet Take 1 tablet (50 mg total) by mouth every 12 (twelve) hours as needed. (Patient taking differently: Take 50 mg by mouth every 12 (twelve) hours as needed (pain). ) 30 tablet 0   No current facility-administered medications for this  visit.    OBJECTIVE:   There were no vitals filed for this visit.   There is no height or weight on file to calculate BMI.   Wt Readings from Last 3 Encounters:  11/25/19 98 lb 11.2 oz (44.8 kg)  11/21/19 97 lb (44 kg)  10/07/19 98 lb 1.7 oz (44.5 kg)       LAB RESULTS:  CMP     Component Value Date/Time   NA 144 11/25/2019 1521   NA 142 01/15/2018 1509   K 4.7 11/25/2019 1521   CL 107 11/25/2019 1521   CO2 30 11/25/2019 1521   GLUCOSE 76 11/25/2019 1521   BUN 21 11/25/2019 1521   BUN 16 01/15/2018 1509   CREATININE 0.88 11/25/2019 1521   CREATININE 0.96 05/03/2019 1557   CALCIUM 9.5 11/25/2019 1521   PROT 7.1 11/25/2019 1521   PROT 6.8 01/15/2018 1509   ALBUMIN 3.7 11/25/2019 1521   ALBUMIN 4.3 01/15/2018 1509   AST 15 11/25/2019 1521   ALT 13 11/25/2019 1521   ALKPHOS 92 11/25/2019 1521   BILITOT 0.3 11/25/2019 1521   GFRNONAA >60 11/25/2019 1521   GFRNONAA 64 05/03/2019 1557   GFRAA >60 11/25/2019 1521   GFRAA 75 05/03/2019 1557    Lab Results  Component Value Date   ALBUMINELP 4.1 05/11/2018   A1GS 0.4 (H) 05/11/2018   A2GS 0.8 05/11/2018   BETS 0.5 05/11/2018   BETA2SER 0.4 05/11/2018   GAMS 0.8 05/11/2018   SPEI  05/11/2018     Comment:     . A poorly-defined band of restricted protein mobility is detected in the gamma globulins. It is unlikely that this may represent a monoclonal protein; however, immunofixation analysis is available if clinically indicated. .     Lab Results  Component Value Date   WBC 4.4 11/25/2019   NEUTROABS 2.3 11/25/2019   HGB 15.1 (H) 11/25/2019   HCT 46.0 11/25/2019   MCV 90.0 11/25/2019   PLT 212 11/25/2019    No results found for: LABCA2  No components found for: LFYBOF751  No results for input(s): INR in the last 168 hours.  No results found for: LABCA2  No results found for: WCH852  No results found for: DPO242  No results found for: PNT614  No results found for: CA2729  No components found  for: HGQUANT  No results found for: CEA1 / No results found for: CEA1   No results found for: AFPTUMOR  No results found for: CHROMOGRNA  No results found for: KPAFRELGTCHN, LAMBDASER, KAPLAMBRATIO (kappa/lambda light chains)  No results found for: HGBA, HGBA2QUANT, HGBFQUANT, HGBSQUAN (Hemoglobinopathy evaluation)   No results found for: LDH  No results found for: IRON, TIBC, IRONPCTSAT (Iron and TIBC)  No results found for: FERRITIN  Urinalysis    Component Value Date/Time   COLORURINE YELLOW 05/11/2018 0957   APPEARANCEUR CLEAR 05/11/2018 0957   LABSPEC 1.015 05/11/2018 0957   PHURINE < OR = 5.0 05/11/2018 0957   GLUCOSEU NEGATIVE 05/11/2018 0957   HGBUR NEGATIVE 05/11/2018 0957   BILIRUBINUR NEGATIVE 02/08/2018 1545   [place] 05/11/2018 0957   PROTEINUR NEGATIVE 05/11/2018 0957   NITRITE NEGATIVE 05/11/2018 0957   LEUKOCYTESUR NEGATIVE 05/11/2018 0957    STUDIES: No results found.   ELIGIBLE FOR AVAILABLE RESEARCH PROTOCOL: Not a COMET candidate given presence of a mass  ASSESSMENT: 59 y.o. [name], [name] woman status post left breast biopsy 06/13/2019 for ductal carcinoma in situ, grade 2, estrogen and progesterone receptor positive.  (a) MRI biopsy of a 0.8 cm left breast mass 07/07/2019, results pending  (1) status post left lumpectomy and sentinel lymph node sampling 08/30/2019 for a pT1c pN0, stage IA invasive ductal carcinoma, grade 1, with DCIS focally involving the inferior margin  (a) a total of 4 left axillary lymph nodes were removed.   (b) inferior margin successfully cleared with additional surgery 10/07/2019  (2) Oncotype score of 15 predicts a risk of breast cancer recurrence outside the breast in the next 9 years of 4% if the patient's only systemic therapy is antiestrogens.  It predicts no benefit from chemotherapy.  (3) adjuvant radiation ongoing  (4) antiestrogens at the end of local treatment   PLAN: [place] did not show for  her 12/27/2019 visit.  A follow-up letter has been sent  [name] C. Kadrian Partch, MD 12/27/2019 5:09 PM Medical Oncology and Hematology [facility] [place], [place] [address] Tel. [phone]    Fax. [phone]   This document serves as a record of services personally performed by Lurline Del, MD. It was created on his behalf by Wilburn Mylar, a trained medical scribe. The creation of this record is based on the scribe's personal observations and the provider's statements to them.   I, Lurline Del MD, have reviewed the above documentation for accuracy and completeness, and I agree with the above.   *Total Encounter Time as defined by the Centers for Medicare and Medicaid Services includes, in addition to the face-to-face time of a patient visit (documented in the note above) non-face-to-face time: obtaining and reviewing outside history, ordering and reviewing medications, tests or procedures, care coordination (communications with other health care professionals or caregivers) and documentation in the medical record.

## 2019-12-27 ENCOUNTER — Ambulatory Visit: Payer: Medicare Other

## 2019-12-27 ENCOUNTER — Encounter: Payer: Self-pay | Admitting: Oncology

## 2019-12-27 ENCOUNTER — Inpatient Hospital Stay (HOSPITAL_BASED_OUTPATIENT_CLINIC_OR_DEPARTMENT_OTHER): Payer: Medicare Other | Admitting: Oncology

## 2019-12-27 ENCOUNTER — Encounter: Payer: Self-pay | Admitting: *Deleted

## 2019-12-27 DIAGNOSIS — Z170 Estrogen receptor positive status [ER+]: Secondary | ICD-10-CM

## 2019-12-27 DIAGNOSIS — C50412 Malignant neoplasm of upper-outer quadrant of left female breast: Secondary | ICD-10-CM

## 2019-12-28 ENCOUNTER — Ambulatory Visit: Payer: Medicare Other

## 2019-12-29 ENCOUNTER — Ambulatory Visit: Payer: Medicare Other

## 2019-12-30 ENCOUNTER — Ambulatory Visit: Payer: Medicare Other

## 2020-01-02 ENCOUNTER — Ambulatory Visit: Payer: Medicare Other

## 2020-01-03 ENCOUNTER — Ambulatory Visit: Payer: Medicare Other

## 2020-01-04 ENCOUNTER — Ambulatory Visit: Payer: Medicare Other

## 2020-01-04 ENCOUNTER — Telehealth: Payer: Self-pay

## 2020-01-04 NOTE — Telephone Encounter (Signed)
LVM for patient to call back re: her radiation treatments. Contact information provided.

## 2020-01-05 ENCOUNTER — Ambulatory Visit: Payer: Medicare Other

## 2020-01-06 ENCOUNTER — Ambulatory Visit: Payer: Medicare Other

## 2020-01-09 ENCOUNTER — Ambulatory Visit: Payer: Medicare Other | Attending: Radiation Oncology

## 2020-01-09 ENCOUNTER — Ambulatory Visit: Payer: Medicare Other

## 2020-01-09 DIAGNOSIS — C50412 Malignant neoplasm of upper-outer quadrant of left female breast: Secondary | ICD-10-CM | POA: Insufficient documentation

## 2020-01-09 DIAGNOSIS — Z170 Estrogen receptor positive status [ER+]: Secondary | ICD-10-CM | POA: Insufficient documentation

## 2020-01-10 ENCOUNTER — Ambulatory Visit: Payer: Medicare Other | Admitting: Radiation Oncology

## 2020-01-10 ENCOUNTER — Ambulatory Visit: Admission: RE | Admit: 2020-01-10 | Payer: Medicare Other | Source: Ambulatory Visit

## 2020-01-10 ENCOUNTER — Ambulatory Visit: Payer: Medicare Other

## 2020-01-11 ENCOUNTER — Ambulatory Visit: Payer: Medicare Other

## 2020-01-11 ENCOUNTER — Encounter: Payer: Self-pay | Admitting: *Deleted

## 2020-01-12 ENCOUNTER — Ambulatory Visit: Payer: Medicare Other

## 2020-01-13 ENCOUNTER — Other Ambulatory Visit: Payer: Self-pay | Admitting: Oncology

## 2020-01-13 ENCOUNTER — Ambulatory Visit: Payer: Medicare Other

## 2020-01-13 ENCOUNTER — Telehealth: Payer: Self-pay | Admitting: Oncology

## 2020-01-13 NOTE — Telephone Encounter (Signed)
Scheduled appt per 11/03 los - left message with appt date and time

## 2020-01-16 ENCOUNTER — Ambulatory Visit: Payer: Medicare Other

## 2020-01-17 ENCOUNTER — Ambulatory Visit: Payer: Medicare Other

## 2020-01-17 ENCOUNTER — Ambulatory Visit: Payer: Medicare Other | Admitting: Radiation Oncology

## 2020-01-18 ENCOUNTER — Other Ambulatory Visit: Payer: Self-pay | Admitting: Internal Medicine

## 2020-01-18 ENCOUNTER — Ambulatory Visit: Payer: Medicare Other

## 2020-01-18 ENCOUNTER — Other Ambulatory Visit: Payer: Self-pay | Admitting: Rheumatology

## 2020-01-18 DIAGNOSIS — M069 Rheumatoid arthritis, unspecified: Secondary | ICD-10-CM

## 2020-01-18 DIAGNOSIS — M0579 Rheumatoid arthritis with rheumatoid factor of multiple sites without organ or systems involvement: Secondary | ICD-10-CM

## 2020-01-18 NOTE — Telephone Encounter (Signed)
Requested medication (s) are due for refill today - expired Rx  Requested medication (s) are on the active medication list -yes  Future visit scheduled -no  Last refill: 03/25/18  Notes to clinic: Request non delegated Rx  Requested Prescriptions  Pending Prescriptions Disp Refills   methotrexate 2.5 MG tablet [Pharmacy Med Name: METHOTREXATE SODIUM 2.5 MG 2.5 Tablet] 24 tablet 1    Sig: TAKE 6 TABLETS BY MOUTH ONCE A WEEK      Not Delegated - Immunology: Immunosuppressive Agents - methotrexate Failed - 01/18/2020  4:14 PM      Failed - This refill cannot be delegated      Failed - HGB in normal range and within 90 days    Hemoglobin  Date Value Ref Range Status  11/25/2019 15.1 (H) 12.0 - 15.0 g/dL Final  01/15/2018 12.8 11.1 - 15.9 g/dL Final          Failed - Valid encounter within last 3 months    Recent Outpatient Visits           9 months ago Moderate persistent reactive airway disease without complication   [place] [facility]ell [name], [name]   10 months ago Other bursal cyst, right elbow   [place] Ladell [name], [name]   1 year ago Arthritis of shoulder region, right   [place] Ladell [name], [name]   1 year ago Tobacco dependence   [name], [name]   1 year ago Pap smear for cervical cancer screening   [place] Ladell [name], [name]              Passed - ALT in normal range and within 90 days    ALT  Date Value Ref Range Status  11/25/2019 13 0 - 44 U/L Final          Passed - AST in normal range and within 90 days    AST  Date Value Ref Range Status  11/25/2019 15 15 - 41 U/L Final          Passed - Cr in normal range and within 90 days    Creatinine  Date Value Ref Range Status  11/25/2019 0.88 0.44 - 1.00 mg/dL Final  01/15/2018 154.0 20.0 - 300.0 mg/dL Final    Creat  Date Value Ref Range Status  05/03/2019 0.96 0.50 - 0.99 mg/dL Final    Comment:    For patients >2 years of age, the reference limit for Creatinine is approximately 13% higher for people identified as African-American. .           Passed - HCT in normal range and within 90 days    HCT  Date Value Ref Range Status  11/25/2019 46.0 36 - 46 % Final   Hematocrit  Date Value Ref Range Status  01/15/2018 38.3 34.0 - 46.6 % Final          Passed - PLT in normal range and within 90 days    Platelets  Date Value Ref Range Status  01/15/2018 311 150 - 450 x10E3/uL Final   Platelet Count  Date Value Ref Range Status  11/25/2019 212 150 - 400 K/uL Final          Passed - RBC in normal range and within 90 days    RBC  Date Value Ref Range Status  11/25/2019 5.11 3.87 - 5.11 MIL/uL Final          Passed - WBC in normal range and within 90 days    WBC  Date Value Ref Range Status  08/26/2019 7.4 4.0 - 10.5 K/uL Final   WBC Count  Date Value Ref Range Status  11/25/2019 4.4 4.0 - 10.5 K/uL Final              Requested Prescriptions  Pending Prescriptions Disp Refills   methotrexate 2.5 MG tablet [Pharmacy Med Name: METHOTREXATE SODIUM 2.5 MG 2.5 Tablet] 24 tablet 1    Sig: TAKE 6 TABLETS BY MOUTH ONCE A WEEK      Not Delegated - Immunology: Immunosuppressive Agents - methotrexate Failed - 01/18/2020  4:14 PM      Failed - This refill cannot be delegated      Failed - HGB in normal range and within 90 days    Hemoglobin  Date Value Ref Range Status  11/25/2019 15.1 (H) 12.0 - 15.0 g/dL Final  01/15/2018 12.8 11.1 - 15.9 g/dL Final          Failed - Valid encounter within last 3 months    Recent Outpatient Visits           9 months ago Moderate persistent reactive airway disease without complication   [name], [name]   10 months ago Other bursal cyst, right elbow   [name], [name]   1 year ago Arthritis of shoulder region, right   [name], [name]   1 year ago Tobacco dependence   [name], [name]   1 year ago Pap smear for cervical cancer screening   [name], [name]              Passed - ALT in normal range and within 90 days    ALT  Date Value Ref Range Status  11/25/2019 13 0 - 44 U/L Final          Passed - AST in normal range and within 90 days    AST  Date Value Ref Range Status  11/25/2019 15 15 - 41 U/L Final          Passed - Cr in normal range and within 90 days    Creatinine  Date Value Ref Range Status  11/25/2019 0.88 0.44 - 1.00 mg/dL Final  01/15/2018 154.0 20.0 - 300.0 mg/dL Final   Creat  Date Value Ref Range Status  05/03/2019 0.96 0.50 - 0.99 mg/dL Final    Comment:    For patients >108 years of age, the reference limit for Creatinine is approximately 13% higher for people identified as African-American. .           Passed - HCT in normal range and within 90 days    HCT  Date Value Ref Range Status  11/25/2019 46.0 36 - 46 % Final   Hematocrit  Date Value Ref Range Status  01/15/2018 38.3 34.0 - 46.6 % Final          Passed - PLT in normal range and within 90 days    Platelets  Date Value Ref Range Status  01/15/2018 311 150 - 450 x10E3/uL Final   Platelet Count  Date Value Ref Range Status  11/25/2019 212  150 - 400 K/uL Final          Passed - RBC in normal range and within 90 days    RBC  Date Value Ref Range Status  11/25/2019 5.11 3.87 - 5.11 MIL/uL Final          Passed - WBC in normal range and within 90 days    WBC  Date Value Ref Range Status  08/26/2019 7.4 4.0 - 10.5 K/uL Final   WBC Count  Date Value Ref Range Status  11/25/2019 4.4 4.0 - 10.5 K/uL Final

## 2020-01-19 ENCOUNTER — Ambulatory Visit: Payer: Medicare Other

## 2020-01-20 ENCOUNTER — Ambulatory Visit: Payer: Medicare Other

## 2020-01-23 ENCOUNTER — Ambulatory Visit: Payer: Medicare Other

## 2020-01-24 ENCOUNTER — Ambulatory Visit: Payer: Medicare Other

## 2020-01-25 ENCOUNTER — Ambulatory Visit: Payer: Medicare Other

## 2020-01-26 ENCOUNTER — Ambulatory Visit: Payer: Medicare Other

## 2020-01-27 ENCOUNTER — Ambulatory Visit: Payer: Medicare Other

## 2020-01-30 ENCOUNTER — Other Ambulatory Visit (HOSPITAL_COMMUNITY): Payer: Self-pay | Admitting: Adult Health

## 2020-01-30 ENCOUNTER — Ambulatory Visit: Payer: Medicare Other

## 2020-01-30 ENCOUNTER — Encounter: Payer: Self-pay | Admitting: *Deleted

## 2020-01-30 ENCOUNTER — Inpatient Hospital Stay: Payer: Medicare Other | Attending: Oncology | Admitting: Adult Health

## 2020-01-30 ENCOUNTER — Other Ambulatory Visit: Payer: Self-pay

## 2020-01-30 ENCOUNTER — Encounter: Payer: Self-pay | Admitting: Adult Health

## 2020-01-30 VITALS — BP 114/77 | HR 90 | Temp 98.0°F | Resp 18 | Ht 65.0 in | Wt 98.9 lb

## 2020-01-30 DIAGNOSIS — Z170 Estrogen receptor positive status [ER+]: Secondary | ICD-10-CM

## 2020-01-30 DIAGNOSIS — M069 Rheumatoid arthritis, unspecified: Secondary | ICD-10-CM | POA: Diagnosis not present

## 2020-01-30 DIAGNOSIS — C50412 Malignant neoplasm of upper-outer quadrant of left female breast: Secondary | ICD-10-CM

## 2020-01-30 DIAGNOSIS — Z923 Personal history of irradiation: Secondary | ICD-10-CM | POA: Diagnosis not present

## 2020-01-30 DIAGNOSIS — F17210 Nicotine dependence, cigarettes, uncomplicated: Secondary | ICD-10-CM | POA: Diagnosis not present

## 2020-01-30 DIAGNOSIS — J449 Chronic obstructive pulmonary disease, unspecified: Secondary | ICD-10-CM | POA: Diagnosis not present

## 2020-01-30 DIAGNOSIS — Z79899 Other long term (current) drug therapy: Secondary | ICD-10-CM | POA: Diagnosis not present

## 2020-01-30 MED ORDER — TAMOXIFEN CITRATE 20 MG PO TABS: 20.0000 mg | ORAL_TABLET | Freq: Every day | ORAL | 3 refills | Status: DC

## 2020-01-30 MED FILL — TAMOXIFEN 20 MG TABLET: 20 | 90 days supply | Qty: 90 | Fill #0

## 2020-01-30 NOTE — Patient Instructions (Signed)
Tamoxifen oral tablet What is this medicine? TAMOXIFEN (ta MOX i fen) blocks the effects of estrogen. It is commonly used to treat breast cancer. It is also used to decrease the chance of breast cancer coming back in women who have received treatment for the disease. It may also help prevent breast cancer in women who have a high risk of developing breast cancer. This medicine may be used for other purposes; ask your health care provider or pharmacist if you have questions. COMMON BRAND NAME(S): Nolvadex What should I tell my health care provider before I take this medicine? They need to know if you have any of these conditions:  blood clots  blood disease  cataracts or impaired eyesight  endometriosis  high calcium levels  high cholesterol  irregular menstrual cycles  liver disease  stroke  uterine fibroids  an unusual reaction to tamoxifen, other medicines, foods, dyes, or preservatives  pregnant or trying to get pregnant  breast-feeding How should I use this medicine? Take this medicine by mouth with a glass of water. Follow the directions on the prescription label. You can take it with or without food. Take your medicine at regular intervals. Do not take your medicine more often than directed. Do not stop taking except on your doctor's advice. A special MedGuide will be given to you by the pharmacist with each prescription and refill. Be sure to read this information carefully each time. Talk to your pediatrician regarding the use of this medicine in children. While this drug may be prescribed for selected conditions, precautions do apply. Overdosage: If you think you have taken too much of this medicine contact a poison control center or emergency room at once. NOTE: This medicine is only for you. Do not share this medicine with others. What if I miss a dose? If you miss a dose, take it as soon as you can. If it is almost time for your next dose, take only that dose. Do  not take double or extra doses. What may interact with this medicine? Do not take this medicine with any of the following medications:  cisapride  certain medicines for irregular heart beat like dronedarone, quinidine  certain medicines for fungal infection like fluconazole, posaconazole  pimozide  saquinavir  thioridazine This medicine may also interact with the following medications:  aminoglutethimide  anastrozole  bromocriptine  chemotherapy drugs  dofetilide  female hormones, like estrogens and birth control pills  letrozole  medroxyprogesterone  phenobarbital  rifampin  warfarin This list may not describe all possible interactions. Give your health care provider a list of all the medicines, herbs, non-prescription drugs, or dietary supplements you use. Also tell them if you smoke, drink alcohol, or use illegal drugs. Some items may interact with your medicine. What should I watch for while using this medicine? Visit your doctor or health care professional for regular checks on your progress. You will need regular pelvic exams, breast exams, and mammograms. If you are taking this medicine to reduce your risk of getting breast cancer, you should know that this medicine does not prevent all types of breast cancer. If breast cancer or other problems occur, there is no guarantee that it will be found at an early stage. Do not become pregnant while taking this medicine or for 2 months after stopping it. Women should inform their doctor if they wish to become pregnant or think they might be pregnant. There is a potential for serious side effects to an unborn child. Talk to your   health care professional or pharmacist for more information. Do not breast-feed an infant while taking this medicine or for 3 months after stopping it. This medicine may interfere with the ability to have a child. Talk with your doctor or health care professional if you are concerned about your  fertility. What side effects may I notice from receiving this medicine? Side effects that you should report to your doctor or health care professional as soon as possible:  allergic reactions like skin rash, itching or hives, swelling of the face, lips, or tongue  changes in vision  changes in your menstrual cycle  difficulty walking or talking  new breast lumps  numbness  pelvic pain or pressure  redness, blistering, peeling or loosening of the skin, including inside the mouth  signs and symptoms of a dangerous change in heartbeat or heart rhythm like chest pain, dizziness, fast or irregular heartbeat, palpitations, feeling faint or lightheaded, falls, breathing problems  sudden chest pain  swelling, pain or tenderness in your calf or leg  unusual bruising or bleeding  vaginal discharge that is bloody, brown, or rust  weakness  yellowing of the whites of the eyes or skin Side effects that usually do not require medical attention (report to your doctor or health care professional if they continue or are bothersome):  fatigue  hair loss, although uncommon and is usually mild  headache  hot flashes  impotence (in men)  nausea, vomiting (mild)  vaginal discharge (white or clear) This list may not describe all possible side effects. Call your doctor for medical advice about side effects. You may report side effects to FDA at 1-800-FDA-1088. Where should I keep my medicine? Keep out of the reach of children. Store at room temperature between 20 and 25 degrees C (68 and 77 degrees F). Protect from light. Keep container tightly closed. Throw away any unused medicine after the expiration date. NOTE: This sheet is a summary. It may not cover all possible information. If you have questions about this medicine, talk to your doctor, pharmacist, or health care provider.  2020 Elsevier/Gold Standard (2018-02-16 11:15:31)  

## 2020-01-30 NOTE — Progress Notes (Signed)
[facility]  [phone] [phone]     ID: [name] DOB: Jul 25, 1960  MR#: [id number]  JKD#:[id number]  Patient Care Team: Ladell Pier, MD as PCP - General (Internal Medicine) Magrinat, Virgie Dad, MD as Consulting Physician (Oncology) [name], MD as Consulting Physician (General Surgery) Bo Merino, MD as Consulting Physician (Rheumatology) Mauro Kaufmann, RN as Oncology Nurse Navigator Rockwell Germany, RN as Oncology Nurse Navigator Beverely Pace, LCSW as Social Worker ([place]) Scot Dock, NP OTHER MD:  CHIEF COMPLAINT: estrogen receptor positive noninvasive breast cancer  CURRENT TREATMENT: anti estrogen therapy   INTERVAL HISTORY: [name] was scheduled today for follow up of her invasive breast cancer.  She did not make it to half of her radiation oncology treatments.  She notes that she had some mental health issues, and she also notes that she had difficulty with transportation regarding her appointments.  She says she is feeling better today  REVIEW OF SYSTEMS: [name] notes that she is not working.  She is on disability.  She says she is homeless, and living with a friend.  She was hoping that she and her son could live together, however it didn't work out.  She denies any new pain.  She has h/o rheumatoid arthritis.  She says this is at baseline.  She is otherwise feeling well and a detailed ROS was non contributory.     HISTORY OF CURRENT ILLNESS: From the original intake note:  [name] had routine screening mammography on 05/10/2019 showing a possible abnormality in the left breast. She underwent left diagnostic mammography with tomography at [facility] on 05/27/2019 showing: breast density category C; indeterminate 0.9 cm left breast calcifications.  There was no axillary ultrasonography.  Accordingly on 06/13/2019 she proceeded to biopsy of the left breast area in question. The  pathology from this procedure ([id number]) showed: ductal carcinoma in situ, intermediate grade. Prognostic indicators significant for: estrogen receptor, 100% positive and progesterone receptor, 60% positive, both with strong staining intensity.   She underwent breast MRI on 06/24/2019 showing: breast composition D; suspicious 8 mm enhancing mass located approximately 2.5 cm superior and medial to biopsy site; no evidence of right breast malignancy; no suspicious lymphadenopathy within limits of the study.  MRI biopsy of the enhancing mass in the upper central left breast was performed today, with results pending.  The patient's subsequent history is as detailed below.   PAST MEDICAL HISTORY: Past Medical History:  Diagnosis Date  . Anemia    during pregnancy  . Anxiety   . Asthma   . Bronchitis age 29  . Cancer ([place])    left breast ca  . COPD (chronic obstructive pulmonary disease) ([place])   . Depression   . Hx of bladder infections    been over 5 years since last one   . Rheumatoid arthritis ([place])    unc rheum   . Tobacco abuse   . Trouble in sleeping     PAST SURGICAL HISTORY: Past Surgical History:  Procedure Laterality Date  . BREAST LUMPECTOMY WITH RADIOACTIVE SEED AND SENTINEL LYMPH NODE BIOPSY Left 08/30/2019   Procedure: LEFT BREAST LUMPECTOMY X 2 WITH RADIOACTIVE SEED AND SENTINEL LYMPH NODE BIOPSY;  Surgeon: [name], MD;  Location: [facility];  Service: General;  Laterality: Left;  PEC BLOCK  . COLONOSCOPY WITH PROPOFOL N/A 03/15/2019   Procedure: COLONOSCOPY WITH PROPOFOL;  Surgeon: Jonathon Bellows, MD;  Location: [facility];  Service: Gastroenterology;  Laterality: N/A;  . HIP ARTHROPLASTY    . JOINT REPLACEMENT    . RE-EXCISION OF BREAST LUMPECTOMY Left 10/07/2019   Procedure: RE-EXCISION OF LEFT BREAST INFERIOR MARGIN;  Surgeon: [name], MD;  Location: [facility];  Service: General;  Laterality: Left;  . TOTAL HIP ARTHROPLASTY Left 02/15/2018    Procedure: TOTAL HIP ARTHROPLASTY ANTERIOR APPROACH;  Surgeon: [name], MD;  [facility];  Service: Orthopedics;  Laterality: Left;  . WISDOM TOOTH EXTRACTION      FAMILY HISTORY: Family History  Problem Relation Age of Onset  . Breast cancer Mother   . Cervical cancer Mother   . Cancer Brother   . Healthy Son   The patient's father died in his 63s.  The patient tells me she essentially has no information on him or his side of the family.  The patient's mother was diagnosed with breast cancer age 71 and died age 68.  She also had a history of uterine cancer.  The patient's mother's mother died at age 43 and developed cancer at age 46.  She does not know what kind of cancer this was.  The patient's mother had 5 sisters and 5 brothers.  1 brother had lung cancer and died at age 58 from it, in the setting of tobacco abuse.  The patient tells me she has 10 Cousins and that the son of one of her cousins has died from cancer but she does not know the type. ADDENDUM: The patient tells me she has found out that her great aunt and and and on her mother side both had breast cancer.   GYNECOLOGIC HISTORY:  No LMP recorded. Patient is postmenopausal. Menarche: 59 years old Age at first live birth: 59 years old [place] P 1 LMP 73 Contraceptive intermittently, with no complications HRT no  Hysterectomy?  No BSO?  No   SOCIAL HISTORY: (updated 06/2019)  [name] describes herself as homeless.  She is currently staying with friends.  She is on disability secondary to her hip problems.  Her son [name] works in Biomedical scientist and is also a Furniture conservator/restorer.  The patient has 2 step grandchildren aged 74 and 64, the 42 year old being special needs.  The patient describes herself as spiritual and nondenominational   ADVANCED DIRECTIVES: At the 11/25/2019 visit the patient was given the appropriate documents to complete and notarized at her discretion   HEALTH MAINTENANCE: Social History   Tobacco Use  .  Smoking status: Current Some Day Smoker    Packs/day: 0.25    Types: Cigarettes    Last attempt to quit: 09/08/2019    Years since quitting: 0.3  . Smokeless tobacco: Never Used  . Tobacco comment: on nicotine patches, rarely smokes  Vaping Use  . Vaping Use: Never used  Substance Use Topics  . Alcohol use: Yes    Comment: rarely   . Drug use: Not Currently    Types: Cocaine    Comment: last used in 2004     Colonoscopy: 03/2019 (Dr. Vicente Males), repeat 2024  PAP: 03/2018, negative  Bone density: none on file   Allergies  Allergen Reactions  . Other Rash    Unknown cleaner used in OR    Current Outpatient Medications  Medication Sig Dispense Refill  . albuterol (VENTOLIN HFA) 108 (90 Base) MCG/ACT inhaler Inhale 2 puffs into the lungs every 6 (six) hours as needed for wheezing or shortness of breath. 18 g 2  . Cholecalciferol (VITAMIN D3)  75 MCG (3000 UT) TABS Take 3,000 Units by mouth daily.    . cyanocobalamin 100 MCG tablet Take 100 mcg by mouth daily.    . folic acid (FOLVITE) 1 MG tablet Take 2 tablets (2 mg total) by mouth daily. 180 tablet 3  . Methotrexate, PF, (OTREXUP) 20 MG/0.4ML SOAJ Inject 20 mg into the skin once a week. 12 pen 0  . Multiple Vitamin (MULTIVITAMIN WITH MINERALS) TABS tablet Take 1 tablet by mouth daily.    . nicotine (NICODERM CQ - DOSED IN MG/24 HOURS) 14 mg/24hr patch PLACE 1 PATCH (14 MG TOTAL) ONTO THE SKIN DAILY. 28 patch 1  . predniSONE (DELTASONE) 1 MG tablet Take 1 mg by mouth daily as needed (strength boost).    . SYMBICORT 80-4.5 MCG/ACT inhaler TAKE 2 PUFFS BY MOUTH TWICE A DAY 10.2 Inhaler 3  . ENBREL MINI 50 MG/ML SOCT INJECT 50 MG INTO THE SKIN ONCE A WEEK. 12 mL 0  . HYDROcodone-acetaminophen (NORCO/VICODIN) 5-325 MG tablet Take 1-2 tablets by mouth every 6 (six) hours as needed for moderate pain or severe pain. (Patient not taking: Reported on 11/21/2019) 15 tablet 0  . traMADol (ULTRAM) 50 MG tablet Take 1 tablet (50 mg total) by mouth every  12 (twelve) hours as needed. (Patient not taking: Reported on 01/30/2020) 30 tablet 0   No current facility-administered medications for this visit.    OBJECTIVE:   Vitals:   01/30/20 1318  BP: 114/77  Pulse: 90  Resp: 18  Temp: 98 F (36.7 C)  SpO2: 97%     Body mass index is 16.46 kg/m.   Wt Readings from Last 3 Encounters:  01/30/20 98 lb 14.4 oz (44.9 kg)  11/25/19 98 lb 11.2 oz (44.8 kg)  11/21/19 97 lb (44 kg)  ECOG1 GENERAL: Patient is a well appearing female in no acute distress HEENT:  Sclerae anicteric.  Oropharynx clear and moist. No ulcerations or evidence of oropharyngeal candidiasis. Neck is supple.  NODES:  No cervical, supraclavicular, or axillary lymphadenopathy palpated.  BREAST EXAM:  Right breast benign, left breast s/p lumpectomy and radiation therapy, no sign of local recurrence.   LUNGS:  Clear to auscultation bilaterally.  No wheezes or rhonchi. HEART:  Regular rate and rhythm. No murmur appreciated. ABDOMEN:  Soft, nontender.  Positive, normoactive bowel sounds. No organomegaly palpated. MSK:  No focal spinal tenderness to palpation. Full range of motion bilaterally in the upper extremities. EXTREMITIES:  No peripheral edema.   SKIN:  Clear with no obvious rashes or skin changes. No nail dyscrasia. NEURO:  Nonfocal. Well oriented.  Appropriate affect.     LAB RESULTS:  CMP     Component Value Date/Time   NA 144 11/25/2019 1521   NA 142 01/15/2018 1509   K 4.7 11/25/2019 1521   CL 107 11/25/2019 1521   CO2 30 11/25/2019 1521   GLUCOSE 76 11/25/2019 1521   BUN 21 11/25/2019 1521   BUN 16 01/15/2018 1509   CREATININE 0.88 11/25/2019 1521   CREATININE 0.96 05/03/2019 1557   CALCIUM 9.5 11/25/2019 1521   PROT 7.1 11/25/2019 1521   PROT 6.8 01/15/2018 1509   ALBUMIN 3.7 11/25/2019 1521   ALBUMIN 4.3 01/15/2018 1509   AST 15 11/25/2019 1521   ALT 13 11/25/2019 1521   ALKPHOS 92 11/25/2019 1521   BILITOT 0.3 11/25/2019 1521   GFRNONAA >60  11/25/2019 1521   GFRNONAA 64 05/03/2019 1557   GFRAA >60 11/25/2019 1521   GFRAA 75 05/03/2019  1557    Lab Results  Component Value Date   ALBUMINELP 4.1 05/11/2018   A1GS 0.4 (H) 05/11/2018   A2GS 0.8 05/11/2018   BETS 0.5 05/11/2018   BETA2SER 0.4 05/11/2018   GAMS 0.8 05/11/2018   SPEI  05/11/2018     Comment:     . A poorly-defined band of restricted protein mobility is detected in the gamma globulins. It is unlikely that this may represent a monoclonal protein; however, immunofixation analysis is available if clinically indicated. .     Lab Results  Component Value Date   WBC 4.4 11/25/2019   NEUTROABS 2.3 11/25/2019   HGB 15.1 (H) 11/25/2019   HCT 46.0 11/25/2019   MCV 90.0 11/25/2019   PLT 212 11/25/2019    No results found for: LABCA2  No components found for: [id number]  No results for input(s): INR in the last 168 hours.  No results found for: LABCA2  No results found for: OHY073  No results found for: XTG626  No results found for: RSW546  No results found for: CA2729  No components found for: HGQUANT  No results found for: CEA1 / No results found for: CEA1   No results found for: AFPTUMOR  No results found for: CHROMOGRNA  No results found for: KPAFRELGTCHN, LAMBDASER, KAPLAMBRATIO (kappa/lambda light chains)  No results found for: HGBA, HGBA2QUANT, HGBFQUANT, HGBSQUAN (Hemoglobinopathy evaluation)   No results found for: LDH  No results found for: IRON, TIBC, IRONPCTSAT (Iron and TIBC)  No results found for: FERRITIN  Urinalysis    Component Value Date/Time   COLORURINE YELLOW 05/11/2018 0957   APPEARANCEUR CLEAR 05/11/2018 0957   LABSPEC 1.015 05/11/2018 0957   PHURINE < OR = 5.0 05/11/2018 0957   GLUCOSEU NEGATIVE 05/11/2018 0957   HGBUR NEGATIVE 05/11/2018 0957   BILIRUBINUR NEGATIVE 02/08/2018 1545   [place] 05/11/2018 0957   PROTEINUR NEGATIVE 05/11/2018 0957   NITRITE NEGATIVE 05/11/2018 0957    LEUKOCYTESUR NEGATIVE 05/11/2018 0957    STUDIES: No results found.   ELIGIBLE FOR AVAILABLE RESEARCH PROTOCOL: Not a COMET candidate given presence of a mass  ASSESSMENT: 59 y.o. [place] Parkins, [place] woman status post left breast biopsy 06/13/2019 for ductal carcinoma in situ, grade 2, estrogen and progesterone receptor positive.  (a) MRI biopsy of a 0.8 cm left breast mass 07/07/2019, results pending  (1) status post left lumpectomy and sentinel lymph node sampling 08/30/2019 for a pT1c pN0, stage IA invasive ductal carcinoma, grade 1, with DCIS focally involving the inferior margin  (a) a total of 4 left axillary lymph nodes were removed.   (b) inferior margin successfully cleared with additional surgery 10/07/2019  (2) Oncotype score of 15 predicts a risk of breast cancer recurrence outside the breast in the next 9 years of 4% if the patient's only systemic therapy is antiestrogens.  It predicts no benefit from chemotherapy.  (3) adjuvant radiation, completed 10/20 treatments  (4) antiestrogens to start 01/30/2020 with Tamoxifen   PLAN: Daveda is doing quite well today.  She has no clinical sign of breast cancer recurrence.  She completed 10/20 of her radiation appointments due to transportation and mental health issues.  At this point, it would not be beneficial to send her back to radiation therapy.  She will go ahead and start anti estrogen therapy with tamoxifen.  We reviewed risks and benefits in detail.  I gave her a handout in her AVS about this.   She will return in 3 months for labs and f/u  with Dr. [name] and in 6 months for a SCP visit.  She knows to call for any questions that may arise between now and her next appointment.  We are happy to see her sooner if needed.   Total encounter time: 30 minutes*  Wilber Bihari, NP 01/30/20 1:35 PM Medical Oncology and Hematology [facility] [place], [place] [address] Tel. [phone]    Fax.  [phone]  *Total Encounter Time as defined by the Centers for Medicare and Medicaid Services includes, in addition to the face-to-face time of a patient visit (documented in the note above) non-face-to-face time: obtaining and reviewing outside history, ordering and reviewing medications, tests or procedures, care coordination (communications with other health care professionals or caregivers) and documentation in the medical record.

## 2020-01-31 ENCOUNTER — Telehealth: Payer: Self-pay | Admitting: Hematology and Oncology

## 2020-01-31 ENCOUNTER — Ambulatory Visit: Payer: Medicare Other

## 2020-01-31 NOTE — Telephone Encounter (Signed)
Scheduled appts per 11/22 los. Left voicemail with appt date and time

## 2020-02-03 ENCOUNTER — Ambulatory Visit: Payer: Medicare Other

## 2020-02-09 ENCOUNTER — Telehealth: Payer: Self-pay

## 2020-02-09 NOTE — Telephone Encounter (Signed)
Spoke with patient and advised she will need an appointment to discuss restarting medication. Patient has not been seen in the office since March 2021. Patient has been scheduled for 02/13/2020 at 1:00 pm.

## 2020-02-09 NOTE — Telephone Encounter (Signed)
Patient called requesting prescription refills of Enbrel and Methotrexate to be sent to [facility].  Patient states she just finished her radiation treatments and wants to start back on her Methotrexate.

## 2020-02-13 ENCOUNTER — Ambulatory Visit: Payer: Medicare Other | Admitting: Physician Assistant

## 2020-02-13 NOTE — Progress Notes (Deleted)
Office Visit Note  Patient: [name]             Date of Birth: 11-13-1960           MRN: [id number]             PCP: Ladell Pier, MD Referring: Ladell Pier, MD Visit Date: 02/13/2020 Occupation: @GUAROCC @  Subjective:  Discuss restarting MTX and Enbrel   History of Present Illness: [name] is a 59 y.o. female with history of seropositive rheumatoid arthritis.    Activities of Daily Living:  Patient reports morning stiffness for   {minute/hour:19697}.   Patient {ACTIONS;DENIES/REPORTS:21021675::"Denies"} nocturnal pain.  Difficulty dressing/grooming: {ACTIONS;DENIES/REPORTS:21021675::"Denies"} Difficulty climbing stairs: {ACTIONS;DENIES/REPORTS:21021675::"Denies"} Difficulty getting out of chair: {ACTIONS;DENIES/REPORTS:21021675::"Denies"} Difficulty using hands for taps, buttons, cutlery, and/or writing: {ACTIONS;DENIES/REPORTS:21021675::"Denies"}  Review of Systems  Constitutional: Negative for fatigue.  HENT: Negative for mouth sores, mouth dryness and nose dryness.   Eyes: Negative for pain, visual disturbance and dryness.  Respiratory: Negative for cough, hemoptysis, shortness of breath and difficulty breathing.   Cardiovascular: Negative for chest pain, palpitations, hypertension and swelling in legs/feet.  Gastrointestinal: Negative for blood in stool, constipation and diarrhea.  Endocrine: Negative for increased urination.  Genitourinary: Negative for painful urination.  Musculoskeletal: Negative for arthralgias, joint pain, joint swelling, myalgias, muscle weakness, morning stiffness, muscle tenderness and myalgias.  Skin: Negative for color change, pallor, rash, hair loss, nodules/bumps, skin tightness, ulcers and sensitivity to sunlight.  Allergic/Immunologic: Negative for susceptible to infections.  Neurological: Negative for dizziness, numbness, headaches and weakness.  Hematological: Negative for swollen glands.  Psychiatric/Behavioral:  Negative for depressed mood and sleep disturbance. The patient is not nervous/anxious.     PMFS History:  Patient Active Problem List   Diagnosis Date Noted  . Malignant neoplasm of upper-outer quadrant of left breast in female, estrogen receptor positive ([place]) 09/15/2019  . 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 Adenomatous colon polyp 03/16/2019  . Degenerative joint disease of shoulder region 06/08/2018  . Rheumatoid arthritis involving both hands with positive rheumatoid factor ([place]) 06/08/2018  . Rheumatoid arthritis involving both feet with positive rheumatoid factor ([place]) 06/08/2018  . 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 Status post total hip replacement, 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  . Smoker 06/08/2018  . Anxiety and depression 06/08/2018  . Family history of breast cancer 03/25/2018  . 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 Rheumatoid arthritis involving multiple sites ([place]) 01/15/2018  . Tobacco dependence 01/15/2018  . Opioid use agreement exists 01/15/2018  . Positive depression screening 01/15/2018  . Homeless 01/15/2018    Past Medical History:  Diagnosis Date  . Anemia    during pregnancy  . Anxiety   . Asthma   . Bronchitis age 13  . Cancer ([place])    left breast ca  . COPD (chronic obstructive pulmonary disease) ([place])   . Depression   . Hx of bladder infections    been over 5 years since last one   . Rheumatoid arthritis ([place])    unc rheum   . Tobacco abuse   . Trouble in sleeping     Family History  Problem Relation Age of Onset  . Breast cancer Mother   . Cervical cancer Mother   . Cancer Brother   . Healthy Son    Past Surgical History:  Procedure Laterality Date  . BREAST LUMPECTOMY WITH RADIOACTIVE SEED AND SENTINEL LYMPH NODE BIOPSY Left 08/30/2019   Procedure: LEFT BREAST LUMPECTOMY X 2 WITH  RADIOACTIVE SEED AND SENTINEL LYMPH NODE BIOPSY;  Surgeon: [name], MD;  Location: [facility];  Service: General;  Laterality: Left;  PEC BLOCK   . COLONOSCOPY WITH PROPOFOL N/A 03/15/2019   Procedure: COLONOSCOPY WITH PROPOFOL;  Surgeon: Jonathon Bellows, MD;  [facility];  Service: Gastroenterology;  Laterality: N/A;  . HIP ARTHROPLASTY    . JOINT REPLACEMENT    . RE-EXCISION OF BREAST LUMPECTOMY Left 10/07/2019   Procedure: RE-EXCISION OF LEFT BREAST INFERIOR MARGIN;  Surgeon: [name], MD;  Location: [facility];  Service: General;  Laterality: Left;  . TOTAL HIP ARTHROPLASTY Left 02/15/2018   Procedure: TOTAL HIP ARTHROPLASTY ANTERIOR APPROACH;  Surgeon: [name], MD;  [facility];  Service: Orthopedics;  Laterality: Left;  . WISDOM TOOTH EXTRACTION     Social History   Social History Narrative  . Not on file   Immunization History  Administered Date(s) Administered  . PFIZER SARS-COV-2 Vaccination 06/27/2019, 07/19/2019  . Pneumococcal Conjugate-13 07/14/2018  . Tdap 03/25/2018  . Zoster Recombinat (Shingrix) 07/14/2018     Objective: Vital Signs: There were no vitals taken for this visit.   Physical Exam Vitals and nursing note reviewed.  Constitutional:      Appearance: [name] is well-developed.  HENT:     Head: Normocephalic and atraumatic.  Eyes:     Conjunctiva/sclera: Conjunctivae normal.  Pulmonary:     Effort: Pulmonary effort is normal.  Abdominal:     Palpations: Abdomen is soft.  Musculoskeletal:     Cervical back: Normal range of motion.  Skin:    General: Skin is warm and dry.     Capillary Refill: Capillary refill takes less than 2 seconds.  Neurological:     Mental Status: [name] is alert and oriented to person, place, and time.  Psychiatric:        Behavior: Behavior normal.      Musculoskeletal Exam:   CDAI Exam: CDAI Score: -- Patient Global: --; Provider Global: -- Swollen: --; Tender: -- Joint Exam 02/13/2020   No joint exam has been documented for this visit   There is currently no information documented on the homunculus. Go to the Rheumatology  activity and complete the homunculus joint exam.  Investigation: No additional findings.  Imaging: No results found.  Recent Labs: Lab Results  Component Value Date   WBC 4.4 11/25/2019   HGB 15.1 (H) 11/25/2019   PLT 212 11/25/2019   NA 144 11/25/2019   K 4.7 11/25/2019   CL 107 11/25/2019   CO2 30 11/25/2019   GLUCOSE 76 11/25/2019   BUN 21 11/25/2019   CREATININE 0.88 11/25/2019   BILITOT 0.3 11/25/2019   ALKPHOS 92 11/25/2019   AST 15 11/25/2019   ALT 13 11/25/2019   PROT 7.1 11/25/2019   ALBUMIN 3.7 11/25/2019   CALCIUM 9.5 11/25/2019   GFRAA >60 11/25/2019   QFTBGOLDPLUS NEGATIVE 05/03/2019    Speciality Comments: No specialty comments available.  Procedures:  No procedures performed Allergies: Other   Assessment / Plan:     Visit Diagnoses: Rheumatoid arthritis involving multiple sites with positive rheumatoid factor (HCC)  High risk medication use  Contracture of left elbow  Other secondary osteoarthritis of both shoulders  Status post total hip replacement, left  Smoker  Anxiety and depression  Family history of breast cancer  [name]  Orders: No orders of the defined types were placed in this encounter.  No orders of  the defined types were placed in this encounter.   Follow-Up Instructions: No follow-ups on file.   Ofilia Neas, PA-C  Note - This record has been created using Dragon software.  Chart creation errors have been sought, but may not always  have been located. Such creation errors do not reflect on  the standard of medical care.

## 2020-02-15 NOTE — Progress Notes (Deleted)
Office Visit Note  Patient: [name]             Date of Birth: 09-Oct-1960           MRN: [id number]             PCP: Ladell Pier, MD Referring: Ladell Pier, MD Visit Date: 02/16/2020 Occupation: @GUAROCC @  Subjective:  No chief complaint on file.   History of Present Illness: [name] is a 59 y.o. female ***   Activities of Daily Living:  Patient reports morning stiffness for *** {minute/hour:19697}.   Patient {ACTIONS;DENIES/REPORTS:21021675::"Denies"} nocturnal pain.  Difficulty dressing/grooming: {ACTIONS;DENIES/REPORTS:21021675::"Denies"} Difficulty climbing stairs: {ACTIONS;DENIES/REPORTS:21021675::"Denies"} Difficulty getting out of chair: {ACTIONS;DENIES/REPORTS:21021675::"Denies"} Difficulty using hands for taps, buttons, cutlery, and/or writing: {ACTIONS;DENIES/REPORTS:21021675::"Denies"}  No Rheumatology ROS completed.   PMFS History:  Patient Active Problem List   Diagnosis Date Noted  . Malignant neoplasm of upper-outer quadrant of left breast in female, estrogen receptor positive ([place]) 09/15/2019  . 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 Adenomatous colon polyp 03/16/2019  . Degenerative joint disease of shoulder region 06/08/2018  . Rheumatoid arthritis involving both hands with positive rheumatoid factor ([place]) 06/08/2018  . Rheumatoid arthritis involving both feet with positive rheumatoid factor ([place]) 06/08/2018  . 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 Status post total hip replacement, 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  . Smoker 06/08/2018  . Anxiety and depression 06/08/2018  . Family history of breast cancer 03/25/2018  . 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 Rheumatoid arthritis involving multiple sites ([place]) 01/15/2018  . Tobacco dependence 01/15/2018  . Opioid use agreement exists 01/15/2018  . Positive depression screening 01/15/2018  . Homeless 01/15/2018    Past Medical History:  Diagnosis Date  . Anemia    during  pregnancy  . Anxiety   . Asthma   . Bronchitis age 74  . Cancer ([place])    left breast ca  . COPD (chronic obstructive pulmonary disease) ([place])   . Depression   . Hx of bladder infections    been over 5 years since last one   . Rheumatoid arthritis ([place])    unc rheum   . Tobacco abuse   . Trouble in sleeping     Family History  Problem Relation Age of Onset  . Breast cancer Mother   . Cervical cancer Mother   . Cancer Brother   . Healthy Son    Past Surgical History:  Procedure Laterality Date  . BREAST LUMPECTOMY WITH RADIOACTIVE SEED AND SENTINEL LYMPH NODE BIOPSY Left 08/30/2019   Procedure: LEFT BREAST LUMPECTOMY X 2 WITH RADIOACTIVE SEED AND SENTINEL LYMPH NODE BIOPSY;  Surgeon: [name], MD;  Location: [place];  Service: General;  Laterality: Left;  PEC BLOCK  . COLONOSCOPY WITH PROPOFOL N/A 03/15/2019   Procedure: COLONOSCOPY WITH PROPOFOL;  Surgeon: Jonathon Bellows, MD;  Location: [facility];  Service: Gastroenterology;  Laterality: N/A;  . HIP ARTHROPLASTY    . JOINT REPLACEMENT    . RE-EXCISION OF BREAST LUMPECTOMY Left 10/07/2019   Procedure: RE-EXCISION OF LEFT BREAST INFERIOR MARGIN;  Surgeon: [name], MD;  Location: [place];  Service: General;  Laterality: Left;  . TOTAL HIP ARTHROPLASTY Left 02/15/2018   Procedure: TOTAL HIP ARTHROPLASTY ANTERIOR APPROACH;  Surgeon: [name], MD;  [facility];  Service: Orthopedics;  Laterality: Left;  . WISDOM TOOTH EXTRACTION     Social History   Social History Narrative  . Not on file  Immunization History  Administered Date(s) Administered  . PFIZER SARS-COV-2 Vaccination 06/27/2019, 07/19/2019  . Pneumococcal Conjugate-13 07/14/2018  . Tdap 03/25/2018  . Zoster Recombinat (Shingrix) 07/14/2018     Objective: Vital Signs: There were no vitals taken for this visit.   Physical Exam   Musculoskeletal Exam: ***  CDAI Exam: CDAI Score: -- Patient Global: --; Provider Global:  -- Swollen: --; Tender: -- Joint Exam 02/16/2020   No joint exam has been documented for this visit   There is currently no information documented on the homunculus. Go to the Rheumatology activity and complete the homunculus joint exam.  Investigation: No additional findings.  Imaging: No results found.  Recent Labs: Lab Results  Component Value Date   WBC 4.4 11/25/2019   HGB 15.1 (H) 11/25/2019   PLT 212 11/25/2019   NA 144 11/25/2019   K 4.7 11/25/2019   CL 107 11/25/2019   CO2 30 11/25/2019   GLUCOSE 76 11/25/2019   BUN 21 11/25/2019   CREATININE 0.88 11/25/2019   BILITOT 0.3 11/25/2019   ALKPHOS 92 11/25/2019   AST 15 11/25/2019   ALT 13 11/25/2019   PROT 7.1 11/25/2019   ALBUMIN 3.7 11/25/2019   CALCIUM 9.5 11/25/2019   GFRAA >60 11/25/2019   QFTBGOLDPLUS NEGATIVE 05/03/2019    Speciality Comments: No specialty comments available.  Procedures:  No procedures performed Allergies: Other   Assessment / Plan:     Visit Diagnoses: Rheumatoid arthritis involving multiple sites with positive rheumatoid factor (HCC)  High risk medication use  Contracture of left elbow  Other secondary osteoarthritis of both shoulders  Status post total hip replacement, left  Malignant neoplasm of upper-outer quadrant of left breast in female, estrogen receptor positive (HCC)  Smoker  Anxiety and depression  Family history of breast cancer  Orders: No orders of the defined types were placed in this encounter.  No orders of the defined types were placed in this encounter.   Face-to-face time spent with patient was *** minutes. Greater than 50% of time was spent in counseling and coordination of care.  Follow-Up Instructions: No follow-ups on file.   Ofilia Neas, PA-C  Note - This record has been created using Dragon software.  Chart creation errors have been sought, but may not always  have been located. Such creation errors do not reflect on  the standard  of medical care.

## 2020-02-16 ENCOUNTER — Ambulatory Visit: Payer: Medicare Other | Admitting: Physician Assistant

## 2020-02-20 NOTE — Progress Notes (Signed)
Office Visit Note  Patient: [name]             Date of Birth: 01-21-1961           MRN: [id number]             PCP: Ladell Pier, MD Referring: Ladell Pier, MD Visit Date: 02/21/2020 Occupation: @GUAROCC @  Subjective:  Right shoulder joint pain   History of Present Illness: [name] is a 59 y.o. female with history of seropositive rheumatoid arthritis and osteoarthritis.  She is on enbrel 50 mg sq injections once weekly, otrexup 20 mg sq injections once weekly, and folic acid 2 mg po daily.  She reports since her last office visit she underwent a lumpectomy of the left breast as well as radiation therapy.  She completed radiation therapy about 1 month ago and was started on tamoxifen about 2 weeks ago. She held MTX for about 6 weeks during the time of radiation therapy and restarted about 2 weeks ago. She remains on enbrel throughout the entire course of radiation.  She states her right shoulder joint pain and bilateral hand pain worsened while off of MTX for about 6 weeks.  She has also not been performing her shoulder exercises recently, which she feels is contributing to her discomfort and stiffness.  She has not been taking any OTC products for pain relief.  She needs refills of enbrel, otrexup, and folic acid today.     Activities of Daily Living:  Patient reports joint stiffness all day Patient Reports nocturnal pain.  Difficulty dressing/grooming: Reports Difficulty climbing stairs: Denies Difficulty getting out of chair: Denies Difficulty using hands for taps, buttons, cutlery, and/or writing: Reports  Review of Systems  Constitutional: Positive for fatigue.  HENT: Positive for mouth sores and mouth dryness. Negative for nose dryness.   Eyes: Negative for pain, itching, visual disturbance and dryness.  Respiratory: Positive for cough. Negative for hemoptysis, shortness of breath and difficulty breathing.   Cardiovascular: Negative for chest pain,  palpitations and swelling in legs/feet.  Gastrointestinal: Positive for constipation and diarrhea. Negative for abdominal pain and blood in stool.  Endocrine: Negative for increased urination.  Genitourinary: Negative for painful urination.  Musculoskeletal: Positive for arthralgias, joint pain, joint swelling, myalgias, muscle weakness, morning stiffness, muscle tenderness and myalgias.  Skin: Negative for color change, rash and redness.  Allergic/Immunologic: Negative for susceptible to infections.  Neurological: Positive for memory loss. Negative for dizziness, numbness, headaches and weakness.  Hematological: Negative for swollen glands.  Psychiatric/Behavioral: Positive for sleep disturbance. Negative for confusion.    PMFS History:  Patient Active Problem List   Diagnosis Date Noted  . Malignant neoplasm of upper-outer quadrant of left breast in female, estrogen receptor positive ([place]) 09/15/2019  . 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 Adenomatous colon polyp 03/16/2019  . Degenerative joint disease of shoulder region 06/08/2018  . Rheumatoid arthritis involving both hands with positive rheumatoid factor ([place]) 06/08/2018  . Rheumatoid arthritis involving both feet with positive rheumatoid factor ([place]) 06/08/2018  . 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 Status post total hip replacement, 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  . Smoker 06/08/2018  . Anxiety and depression 06/08/2018  . Family history of breast cancer 03/25/2018  . 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 Rheumatoid arthritis involving multiple sites ([place]) 01/15/2018  . Tobacco dependence 01/15/2018  . Opioid use agreement exists 01/15/2018  . Positive depression screening 01/15/2018  . Homeless 01/15/2018  Past Medical History:  Diagnosis Date  . Anemia    during pregnancy  . Anxiety   . Asthma   . Bronchitis age 23  . Cancer ([place])    left breast ca  . COPD (chronic obstructive pulmonary disease) ([place])    . Depression   . Hx of bladder infections    been over 5 years since last one   . Rheumatoid arthritis ([place])    unc rheum   . Tobacco abuse   . Trouble in sleeping     Family History  Problem Relation Age of Onset  . Breast cancer Mother   . Cervical cancer Mother   . Cancer Brother   . Healthy Son    Past Surgical History:  Procedure Laterality Date  . BREAST LUMPECTOMY WITH RADIOACTIVE SEED AND SENTINEL LYMPH NODE BIOPSY Left 08/30/2019   Procedure: LEFT BREAST LUMPECTOMY X 2 WITH RADIOACTIVE SEED AND SENTINEL LYMPH NODE BIOPSY;  Surgeon: [name], MD;  Location: [place];  Service: General;  Laterality: Left;  PEC BLOCK  . COLONOSCOPY WITH PROPOFOL N/A 03/15/2019   Procedure: COLONOSCOPY WITH PROPOFOL;  Surgeon: Jonathon Bellows, MD;  Location: [facility];  Service: Gastroenterology;  Laterality: N/A;  . HIP ARTHROPLASTY    . JOINT REPLACEMENT    . RE-EXCISION OF BREAST LUMPECTOMY Left 10/07/2019   Procedure: RE-EXCISION OF LEFT BREAST INFERIOR MARGIN;  Surgeon: [name], MD;  Location: [place] [place];  Service: General;  Laterality: Left;  . TOTAL HIP ARTHROPLASTY Left 02/15/2018   Procedure: TOTAL HIP ARTHROPLASTY ANTERIOR APPROACH;  Surgeon: [name], MD;  [facility];  Service: Orthopedics;  Laterality: Left;  . WISDOM TOOTH EXTRACTION     Social History   Social History Narrative  . Not on file   Immunization History  Administered Date(s) Administered  . PFIZER SARS-COV-2 Vaccination 06/27/2019, 07/19/2019  . Pneumococcal Conjugate-13 07/14/2018  . Tdap 03/25/2018  . Zoster Recombinat (Shingrix) 07/14/2018     Objective: Vital Signs: BP 111/78 (BP Location: Left Arm, Patient Position: Sitting, Cuff Size: Small)   Pulse 87   Ht 5' 5.5" (1.664 m)   Wt 103 lb 9.6 oz (47 kg)   BMI 16.98 kg/m    Physical Exam Vitals and nursing note reviewed.  Constitutional:      Appearance: She is well-developed and well-nourished.  HENT:     Head:  Normocephalic and atraumatic.  Eyes:     Extraocular Movements: EOM normal.     Conjunctiva/sclera: Conjunctivae normal.  Cardiovascular:     Pulses: Intact distal pulses.  Pulmonary:     Effort: Pulmonary effort is normal.  Abdominal:     Palpations: Abdomen is soft.  Musculoskeletal:     Cervical back: Normal range of motion.  Skin:    General: Skin is warm and dry.     Capillary Refill: Capillary refill takes less than 2 seconds.  Neurological:     Mental Status: She is alert and oriented to person, place, and time.  Psychiatric:        Mood and Affect: Mood and affect normal.        Behavior: Behavior normal.      Musculoskeletal Exam: C-spine slightly limited ROM with lateral rotation.  Thoracic and lumbar spine good ROM.  Painful and limited ROM of right shoulder joint.  Tenderness to palpation over the right shoulder.  Left shoulder has good ROM with no discomfort.  Elbow joints, wrist joints, MCPs, PIPs, and DIPs good  ROM with no synovitis.  Rheumatoid nodule on the extensor surface of right elbow.  Left hip has limited ROM.  Right hip has good ROM with no discomfort.  Knee joints good ROM with no discomfort.  Right knee crepitus noted.  Ankle joints good ROM with no discomfort.  No tenderness or inflammation of ankle joints noted.  No tenderness of MTP joints.  CDAI Exam: CDAI Score: 2  Patient Global: 5 mm; Provider Global: 5 mm Swollen: 0 ; Tender: 1  Joint Exam 02/21/2020      Right  Left  Glenohumeral   Tender        Investigation: No additional findings.  Imaging: No results found.  Recent Labs: Lab Results  Component Value Date   WBC 4.4 11/25/2019   HGB 15.1 (H) 11/25/2019   PLT 212 11/25/2019   NA 144 11/25/2019   K 4.7 11/25/2019   CL 107 11/25/2019   CO2 30 11/25/2019   GLUCOSE 76 11/25/2019   BUN 21 11/25/2019   CREATININE 0.88 11/25/2019   BILITOT 0.3 11/25/2019   ALKPHOS 92 11/25/2019   AST 15 11/25/2019   ALT 13 11/25/2019   PROT 7.1  11/25/2019   ALBUMIN 3.7 11/25/2019   CALCIUM 9.5 11/25/2019   GFRAA >60 11/25/2019   QFTBGOLDPLUS NEGATIVE 05/03/2019    Speciality Comments: No specialty comments available.  Procedures:  No procedures performed Allergies: Other   Assessment / Plan:     Visit Diagnoses: Rheumatoid arthritis involving multiple sites with positive rheumatoid factor [facility]): She presents today with increased pain and tenderness of the right shoulder joint.  She continues to experience intermittent pain and stiffness in both hands but no synovitis was noted on examination today.  She is currently on Enbrel 50 mg subcutaneous injections once weekly, Otrexup 20 mg sq injections once weekly, and folic acid 2 mg by mouth daily but had a recent gap in otrexup dosing. She was diagnosed with ductal carcinoma in situ of the left breast on 06/13/19. She underwent a left lumpectomy on 08/30/2019 and adjuvant radiation therapy was completed on 12/28/2019. She will not require chemotherapy at this time, but she was started on tamoxifen after her last oncology office visit on 01/30/2020.  As recommended she held [place] for about 6 weeks while undergoing radiation therapy but continued on Enbrel as prescribed.  She experienced increased pain in the right shoulder joint and both hands while off of otrexup.  She resumed otrexup about 2 weeks ago.  She needs refills of Enbrel, Otrexup, and folic acid today.  She is due for both her Enbrel and Otrexup dose today.  She declined samples while in the office.  New prescriptions will be sent to the pharmacy.  She was advised to notify us if her joint pain persists or worsens.  She will follow-up in the office in 5 months.   High risk medication use - Enbrel 50 mg subcutaneous injections once weekly (started on 05/10/2019), Otrexup 20 mg subcutaneous injections once weekly, and folic acid 2 mg by mouth daily.  CBC and CMP were updated on 11/25/2019.  She is due to update lab work today.  Orders for  CBC and CMP were released.  Her next lab work will be due in March and every 3 months to monitor for drug toxicity.  TB gold was negative on 05/03/2019.  A future order for TB gold was placed today.- Plan: CBC with Differential/Platelet, COMPLETE METABOLIC PANEL WITH GFR, QuantiFERON-TB Gold Plus She has not had  any recent infections.  We discussed the importance of holding Enbrel and Otrexup if she develops signs or symptoms of an infection and to resume once the infection has completely cleared.  She voiced understanding.  Screening for tuberculosis -TB gold negative on 05/03/2019.  Future order for TB gold was placed today.  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  She has no tenderness or inflammation on examination today.  Other secondary osteoarthritis of both shoulders: Chronic pain, Right >left. Evaluated by Dr. [name] in the past.  No a good surgical candidate.  She has painful and limited ROM of the right shoulder joint on exam.  Abduction to about 90 degrees.  She has been experiencing increased pain and stiffness in the right shoulder over the past 2 months including increased nocturnal pain.  She has not been performing shoulder exercises on a daily basis recently.  She was encouraged to restart shoulder exercises.  Status post total hip replacement, left: She has limited range of motion with some discomfort.  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 Underwent lumpectomy 08/30/19 and radiation therapy-completed 12/28/19.  Held otrexup for about 6 weeks while undergoing radiation therapy.  She will be taking tamoxifen as prescribed for the next 5 years.  She has been tolerating tamoxifen without any side effects.  Family history of breast cancer  Anxiety and depression    Orders: Orders Placed This Encounter  Procedures  . CBC with Differential/Platelet  . COMPLETE METABOLIC PANEL WITH GFR  .  QuantiFERON-TB Gold Plus   Meds ordered this encounter  Medications  . Etanercept (ENBREL MINI) 50 MG/ML SOCT    Sig: Inject 50 mg into the skin once a week.    Dispense:  12 mL    Refill:  0  . folic acid (FOLVITE) 1 MG tablet    Sig: Take 2 tablets (2 mg total) by mouth daily.    Dispense:  180 tablet    Refill:  3  . Methotrexate, PF, (OTREXUP) 20 MG/0.4ML SOAJ    Sig: Inject 20 mg into the skin once a week.    Dispense:  4.8 mL    Refill:  0      Follow-Up Instructions: Return in about 5 months (around 07/21/2020) for Rheumatoid arthritis.   Ofilia Neas, PA-C  Note - This record has been created using Dragon software.  Chart creation errors have been sought, but may not always  have been located. Such creation errors do not reflect on  the standard of medical care.

## 2020-02-21 ENCOUNTER — Other Ambulatory Visit: Payer: Self-pay

## 2020-02-21 ENCOUNTER — Ambulatory Visit (INDEPENDENT_AMBULATORY_CARE_PROVIDER_SITE_OTHER): Payer: Medicare Other | Admitting: Physician Assistant

## 2020-02-21 ENCOUNTER — Encounter: Payer: Self-pay | Admitting: Physician Assistant

## 2020-02-21 ENCOUNTER — Other Ambulatory Visit (HOSPITAL_COMMUNITY): Payer: Self-pay | Admitting: Physician Assistant

## 2020-02-21 VITALS — BP 111/78 | HR 87 | Ht 65.5 in | Wt 103.6 lb

## 2020-02-21 DIAGNOSIS — M0579 Rheumatoid arthritis with rheumatoid factor of multiple sites without organ or systems involvement: Secondary | ICD-10-CM | POA: Diagnosis not present

## 2020-02-21 DIAGNOSIS — Z79899 Other long term (current) drug therapy: Secondary | ICD-10-CM

## 2020-02-21 DIAGNOSIS — M19211 Secondary osteoarthritis, right shoulder: Secondary | ICD-10-CM

## 2020-02-21 DIAGNOSIS — D0512 Intraductal carcinoma in situ of left breast: Secondary | ICD-10-CM

## 2020-02-21 DIAGNOSIS — M24522 Contracture, left elbow: Secondary | ICD-10-CM | POA: Diagnosis not present

## 2020-02-21 DIAGNOSIS — F17200 Nicotine dependence, unspecified, uncomplicated: Secondary | ICD-10-CM

## 2020-02-21 DIAGNOSIS — Z111 Encounter for screening for respiratory tuberculosis: Secondary | ICD-10-CM

## 2020-02-21 DIAGNOSIS — Z96642 Presence of left artificial hip joint: Secondary | ICD-10-CM

## 2020-02-21 DIAGNOSIS — M19212 Secondary osteoarthritis, left shoulder: Secondary | ICD-10-CM

## 2020-02-21 DIAGNOSIS — F419 Anxiety disorder, unspecified: Secondary | ICD-10-CM

## 2020-02-21 DIAGNOSIS — F32A Depression, unspecified: Secondary | ICD-10-CM

## 2020-02-21 DIAGNOSIS — Z803 Family history of malignant neoplasm of breast: Secondary | ICD-10-CM

## 2020-02-21 MED ORDER — FOLIC ACID 1 MG PO TABS: 2.0000 mg | ORAL_TABLET | Freq: Every day | ORAL | 3 refills | Status: DC

## 2020-02-21 MED ORDER — OTREXUP 20 MG/0.4ML ~~LOC~~ SOAJ: 20.0000 mg | SUBCUTANEOUS | 0 refills | Status: DC

## 2020-02-21 MED ORDER — ENBREL MINI 50 MG/ML ~~LOC~~ SOCT: 50.0000 mg | SUBCUTANEOUS | 0 refills | Status: DC

## 2020-02-21 MED FILL — FOLIC ACID 1 MG TABS: 1 | 90 days supply | Qty: 180 | Fill #0

## 2020-02-21 NOTE — Patient Instructions (Signed)
Standing Labs We placed an order today for your standing lab work.   Please have your standing labs drawn in March and every 3 months    If possible, please have your labs drawn 2 weeks prior to your appointment so that the provider can discuss your results at your appointment.  We have open lab daily Monday through Thursday from 8:30-12:30 PM and 1:30-4:30 PM and Friday from 8:30-12:30 PM and 1:30-4:00 PM at the office of Dr. Shaili Deveshwar, [facility].   Please be advised, patients with office appointments requiring lab work will take precedents over walk-in lab work.  If possible, please come for your lab work on Monday and Friday afternoons, as you may experience shorter wait times. The office is located at [address], [address], [place], [place] [address] No appointment is necessary.   Labs are drawn by [facility]. Please bring your co-pay at the time of your lab draw.  You may receive a bill from [facility] for your lab work.  If you wish to have your labs drawn at another location, please call the office 24 hours in advance to send orders.  If you have any questions regarding directions or hours of operation,  please call [phone].   As a reminder, please drink plenty of water prior to coming for your lab work. Thanks!   

## 2020-02-22 LAB — COMPLETE METABOLIC PANEL WITH GFR
AG Ratio: 1.9 (calc) (ref 1.0–2.5)
ALT: 13 U/L (ref 6–29)
AST: 15 U/L (ref 10–35)
Albumin: 4.1 g/dL (ref 3.6–5.1)
Alkaline phosphatase (APISO): 83 U/L (ref 37–153)
BUN: 10 mg/dL (ref 7–25)
CO2: 28 mmol/L (ref 20–32)
Calcium: 9.3 mg/dL (ref 8.6–10.4)
Chloride: 105 mmol/L (ref 98–110)
Creat: 0.71 mg/dL (ref 0.50–0.99)
GFR, Est African American: 107 mL/min/{1.73_m2} (ref >=60)
GFR, Est Non African American: 92 mL/min/{1.73_m2} (ref >=60)
Globulin: 2.2 g/dL (calc) (ref 1.9–3.7)
Glucose, Bld: 82 mg/dL (ref 65–99)
Potassium: 4.9 mmol/L (ref 3.5–5.3)
Sodium: 140 mmol/L (ref 135–146)
Total Bilirubin: 0.6 mg/dL (ref 0.2–1.2)
Total Protein: 6.3 g/dL (ref 6.1–8.1)

## 2020-02-22 LAB — CBC WITH DIFFERENTIAL/PLATELET
Absolute Monocytes: 385 cells/uL (ref 200–950)
Basophils Absolute: 61 cells/uL (ref 0–200)
Basophils Relative: 1.1 %
Eosinophils Absolute: 182 cells/uL (ref 15–500)
Eosinophils Relative: 3.3 %
HCT: 45.2 % — ABNORMAL HIGH (ref 35.0–45.0)
Hemoglobin: 15.2 g/dL (ref 11.7–15.5)
Lymphs Abs: 1804 cells/uL (ref 850–3900)
MCH: 29.8 pg (ref 27.0–33.0)
MCHC: 33.6 g/dL (ref 32.0–36.0)
MCV: 88.6 fL (ref 80.0–100.0)
MPV: 10.4 fL (ref 7.5–12.5)
Monocytes Relative: 7 %
Neutro Abs: 3069 cells/uL (ref 1500–7800)
Neutrophils Relative %: 55.8 %
Platelets: 226 10*3/uL (ref 140–400)
RBC: 5.1 10*6/uL (ref 3.80–5.10)
RDW: 13.3 % (ref 11.0–15.0)
Total Lymphocyte: 32.8 %
WBC: 5.5 10*3/uL (ref 3.8–10.8)

## 2020-02-22 NOTE — Progress Notes (Signed)
Hematocrit is borderline elevated. Rest of CBC WNL.  CMP WNL.

## 2020-02-23 ENCOUNTER — Telehealth: Payer: Self-pay

## 2020-02-23 MED FILL — ENBREL MINI 50 MG/ML SOCT: 50 | 28 days supply | Qty: 4 | Fill #0

## 2020-02-23 MED FILL — OTREXUP 20 MG/0.4 ML AUTO-I: 20 | 84 days supply | Qty: 5 | Fill #0

## 2020-02-23 NOTE — Telephone Encounter (Signed)
Attempted to contact patient and left message on machine for patient to call the office. We have a couple of otrexup 20mg  samples that are expiring on 03/09/2020 and I was going to offer a couple of them to the patient.

## 2020-02-29 ENCOUNTER — Telehealth: Payer: Self-pay

## 2020-02-29 NOTE — Telephone Encounter (Signed)
2 otrexup 20mg  samples have been reserved for the patient in the  sample cabinet. The patient stated she is due to inject today (02/29/2020) and then will inject on 03/07/2020. Both samples must be used by 03/09/2020 or they will need to be discarded.

## 2020-02-29 NOTE — Telephone Encounter (Signed)
Patient called stating she was returning Marissa's call from last week to stop by the office to pick up samples of Otrexup.  Confirmed with Marissa and patient was told she can stop by the office this afternoon to pick up 2 samples.

## 2020-03-16 ENCOUNTER — Telehealth: Payer: Self-pay | Admitting: *Deleted

## 2020-03-16 NOTE — Telephone Encounter (Signed)
This RN returned call to per per her VM - obtained pt's vm- message left requesting pt to call again.

## 2020-03-20 ENCOUNTER — Other Ambulatory Visit: Payer: Self-pay | Admitting: Internal Medicine

## 2020-03-20 ENCOUNTER — Telehealth: Payer: Self-pay | Admitting: Internal Medicine

## 2020-03-20 NOTE — Telephone Encounter (Signed)
Medication:albuterol (VENTOLIN HFA) 108 (90 Base) MCG/ACT inhaler  Has the pt contacted their pharmacy?nyes but they ask her to call the dr  Preferred pharmacy:[id number] - [place], [place]  Please be advised refills may take up to 3 business days.  We ask that you follow up with your pharmacy.  Pt had breast cancer so had been doing treatments for a while she said. Would like asap

## 2020-03-20 NOTE — Telephone Encounter (Signed)
Duplicate request. Refill sent to pharmacy on 03/20/20.

## 2020-03-22 ENCOUNTER — Ambulatory Visit: Payer: Medicare Other

## 2020-03-22 MED FILL — ENBREL MINI 50 MG/ML SOCT: 50 | 28 days supply | Qty: 4 | Fill #1

## 2020-04-05 ENCOUNTER — Telehealth: Payer: Self-pay | Admitting: Pharmacist

## 2020-04-05 NOTE — Telephone Encounter (Signed)
Faxed prior auth clinical questions to [facility] for Enbrel Mini renewal.  Phone: [phone] Fax: [phone]  Knox Saliva, PharmD, MPH Clinical Pharmacist (Rheumatology and Pulmonology)

## 2020-04-09 NOTE — Telephone Encounter (Signed)
Submitted a Prior Authorization request to [place] [facility] [place] for ENBREL via Cover My Meds. Will update once we receive a response.  PA# [id number] W

## 2020-04-10 NOTE — Telephone Encounter (Signed)
[place] [employer] website states denial on 04/05/20 was upheld. Does not show any reasoning. Will call plan to inquire.  Phone: [phone]

## 2020-04-11 ENCOUNTER — Other Ambulatory Visit: Payer: Self-pay | Admitting: Internal Medicine

## 2020-04-11 DIAGNOSIS — J4540 Moderate persistent asthma, uncomplicated: Secondary | ICD-10-CM

## 2020-04-11 NOTE — Telephone Encounter (Signed)
[place] [employer] denied PA due to it being her secondary insurance plan. Patient has [place] Medicare, which has an active PA on file until 05/02/20.  Attempted to renew via CMM, put plan says PA is not needed at this time.

## 2020-04-23 MED FILL — ENBREL MINI 50 MG/ML SOCT: 50 | 28 days supply | Qty: 4 | Fill #2

## 2020-05-02 ENCOUNTER — Ambulatory Visit: Payer: Medicare Other | Admitting: Oncology

## 2020-05-02 ENCOUNTER — Other Ambulatory Visit: Payer: Medicare Other

## 2020-05-02 NOTE — Progress Notes (Incomplete)
[place]  Telephone:([phone]) [phone] Fax:(336) [phone]     ID: [name] DOB: Feb 12, 1961  MR#: [id number]  ACZ#:[id number]  Patient Care Team: Ladell Pier, MD as PCP - General (Internal Medicine) Magrinat, Virgie Dad, MD as Consulting Physician (Oncology) [name], MD as Consulting Physician (General Surgery) Bo Merino, MD as Consulting Physician (Rheumatology) Mauro Kaufmann, RN as Oncology Nurse Navigator Rockwell Germany, RN as Oncology Nurse Navigator Beverely Pace, LCSW as Social Worker ([place]) Aurea Graff OTHER MD:  CHIEF COMPLAINT: estrogen receptor positive noninvasive breast cancer  CURRENT TREATMENT: anti estrogen therapy   INTERVAL HISTORY: [name] returns today for follow up of her invasive breast cancer.    She was started on tamoxifen at her last visit on 01/30/2020.  She will be due for her first annual mammography since diagnosis next month.   REVIEW OF SYSTEMS: [name]    COVID 19 VACCINATION STATUS: [place] x2, most recently 07/2019   HISTORY OF CURRENT ILLNESS: From the original intake note:  [name] had routine screening mammography on 05/10/2019 showing a possible abnormality in the left breast. She underwent left diagnostic mammography with tomography at [facility] on 05/27/2019 showing: breast density category C; indeterminate 0.9 cm left breast calcifications.  There was no axillary ultrasonography.  Accordingly on 06/13/2019 she proceeded to biopsy of the left breast area in question. The pathology from this procedure ([id number]) showed: ductal carcinoma in situ, intermediate grade. Prognostic indicators significant for: estrogen receptor, 100% positive and progesterone receptor, 60% positive, both with strong staining intensity.   She underwent breast MRI on 06/24/2019 showing: breast composition D; suspicious 8 mm enhancing mass located approximately 2.5 cm superior and medial to  biopsy site; no evidence of right breast malignancy; no suspicious lymphadenopathy within limits of the study.  MRI biopsy of the enhancing mass in the upper central left breast was performed today, with results pending.  The patient's subsequent history is as detailed below.   PAST MEDICAL HISTORY: Past Medical History:  Diagnosis Date  . Anemia    during pregnancy  . Anxiety   . Asthma   . Bronchitis age 71  . Cancer ([place])    left breast ca  . COPD (chronic obstructive pulmonary disease) ([place])   . Depression   . Hx of bladder infections    been over 5 years since last one   . Rheumatoid arthritis ([place])    unc rheum   . Tobacco abuse   . Trouble in sleeping     PAST SURGICAL HISTORY: Past Surgical History:  Procedure Laterality Date  . BREAST LUMPECTOMY WITH RADIOACTIVE SEED AND SENTINEL LYMPH NODE BIOPSY Left 08/30/2019   Procedure: LEFT BREAST LUMPECTOMY X 2 WITH RADIOACTIVE SEED AND SENTINEL LYMPH NODE BIOPSY;  Surgeon: [name], MD;  Location: [facility];  Service: General;  Laterality: Left;  PEC BLOCK  . COLONOSCOPY WITH PROPOFOL N/A 03/15/2019   Procedure: COLONOSCOPY WITH PROPOFOL;  Surgeon: Jonathon Bellows, MD;  Location: [facility] END[facility]COPY;  Service: Gastroenterology;  Laterality: N/A;  . HIP ARTHROPLASTY    . JOINT REPLACEMENT    . RE-EXCISION OF BREAST LUMPECTOMY Left 10/07/2019   Procedure: RE-EXCISION OF LEFT BREAST INFERIOR MARGIN;  Surgeon: [name], MD;  Location: [facility];  Service: General;  Laterality: Left;  . TOTAL HIP ARTHROPLASTY Left 02/15/2018   Procedure: TOTAL HIP ARTHROPLASTY ANTERIOR APPROACH;  Surgeon: [name], MD;  Location: [facility];  Service:  Orthopedics;  Laterality: Left;  . WISDOM TOOTH EXTRACTION      FAMILY HISTORY: Family History  Problem Relation Age of Onset  . Breast cancer Mother   . Cervical cancer Mother   . Cancer Brother   . Healthy Son   The patient's father died in his 64s.  The patient tells me she  essentially has no information on him or his side of the family.  The patient's mother was diagnosed with breast cancer age 49 and died age 61.  She also had a history of uterine cancer.  The patient's mother's mother died at age 28 and developed cancer at age 49.  She does not know what kind of cancer this was.  The patient's mother had 5 sisters and 5 brothers.  1 brother had lung cancer and died at age 39 from it, in the setting of tobacco abuse.  The patient tells me she has 74 Cousins and that the son of one of her cousins has died from cancer but she does not know the type. ADDENDUM: The patient tells me she has found out that her great aunt and and and on her mother side both had breast cancer.   GYNECOLOGIC HISTORY:  No LMP recorded. Patient is postmenopausal. Menarche: 60 years old Age at first live birth: 60 years old [place] P 1 LMP 56 Contraceptive intermittently, with no complications HRT no  Hysterectomy?  No BSO?  No   SOCIAL HISTORY: (updated 06/2019)  [name] describes herself as homeless.  She is currently staying with friends.  She is on disability secondary to her hip problems.  Her son [name] works in Biomedical scientist and is also a Furniture conservator/restorer.  The patient has 2 step grandchildren aged 36 and 37, the 96 year old being special needs.  The patient describes herself as spiritual and nondenominational   ADVANCED DIRECTIVES: At the 11/25/2019 visit the patient was given the appropriate documents to complete and notarized at her discretion   HEALTH MAINTENANCE: Social History   Tobacco Use  . Smoking status: Current Some Day Smoker    Packs/day: 0.25    Types: Cigarettes    Last attempt to quit: 09/08/2019    Years since quitting: 0.6  . Smokeless tobacco: Never Used  . Tobacco comment: on nicotine patches, rarely smokes  Vaping Use  . Vaping Use: Never used  Substance Use Topics  . Alcohol use: Yes    Comment: rarely   . Drug use: Not Currently    Types: Cocaine    Comment:  last used in 2004     Colonoscopy: 03/2019 (Dr. Vicente Males), repeat 2024  PAP: 03/2018, negative  Bone density: none on file   Allergies  Allergen Reactions  . Other Rash    Unknown cleaner used in OR    Current Outpatient Medications  Medication Sig Dispense Refill  . albuterol (VENTOLIN HFA) 108 (90 Base) MCG/ACT inhaler TAKE 2 PUFFS BY MOUTH EVERY 6 HOURS AS NEEDED FOR WHEEZE OR SHORTNESS OF BREATH 18 each 2  . amoxicillin (AMOXIL) 500 MG capsule Take 1,000 mg by mouth as needed. Prior to dental procedures    . Cholecalciferol (VITAMIN D3) 75 MCG (3000 UT) TABS Take 3,000 Units by mouth daily.    . cyanocobalamin 100 MCG tablet Take 100 mcg by mouth daily.    . Etanercept (ENBREL MINI) 50 MG/ML SOCT Inject 50 mg into the skin once a week. 12 mL 0  . folic acid (FOLVITE) 1 MG tablet Take 2 tablets (2 mg  total) by mouth daily. 180 tablet 3  . Methotrexate, PF, (OTREXUP) 20 MG/0.4ML SOAJ Inject 20 mg into the skin once a week. 4.8 mL 0  . Multiple Vitamin (MULTIVITAMIN WITH MINERALS) TABS tablet Take 1 tablet by mouth daily.    . nicotine (NICODERM CQ - DOSED IN MG/24 HOURS) 14 mg/24hr patch PLACE 1 PATCH (14 MG TOTAL) ONTO THE SKIN DAILY. 28 patch 1  . predniSONE (DELTASONE) 1 MG tablet Take 1 mg by mouth daily as needed (strength boost).    . SYMBICORT 80-4.5 MCG/ACT inhaler TAKE 2 PUFFS BY MOUTH TWICE A DAY 30.6 each 0  . tamoxifen (NOLVADEX) 20 MG tablet Take 1 tablet (20 mg total) by mouth daily. 90 tablet 3   No current facility-administered medications for this visit.    OBJECTIVE:   There were no vitals filed for this visit.   There is no height or weight on file to calculate BMI.   Wt Readings from Last 3 Encounters:  02/21/20 103 lb 9.6 oz (47 kg)  01/30/20 98 lb 14.4 oz (44.9 kg)  11/25/19 98 lb 11.2 oz (44.8 kg)  ECOG1  Sclerae unicteric, EOMs intact Wearing a mask No cervical or supraclavicular adenopathy Lungs no rales or rhonchi Heart regular rate and rhythm Abd  soft, nontender, positive bowel sounds MSK no focal spinal tenderness, no upper extremity lymphedema Neuro: nonfocal, well oriented, appropriate affect Breasts:    {GENERAL: Patient is a well appearing female in no acute distress HEENT:  Sclerae anicteric.  Oropharynx clear and moist. No ulcerations or evidence of oropharyngeal candidiasis. Neck is supple.  NODES:  No cervical, supraclavicular, or axillary lymphadenopathy palpated.  BREAST EXAM:  Right breast benign, left breast s/p lumpectomy and radiation therapy, no sign of local recurrence.   LUNGS:  Clear to auscultation bilaterally.  No wheezes or rhonchi. HEART:  Regular rate and rhythm. No murmur appreciated. ABDOMEN:  Soft, nontender.  Positive, normoactive bowel sounds. No organomegaly palpated. MSK:  No focal spinal tenderness to palpation. Full range of motion bilaterally in the upper extremities. EXTREMITIES:  No peripheral edema.   SKIN:  Clear with no obvious rashes or skin changes. No nail dyscrasia. NEURO:  Nonfocal. Well oriented.  Appropriate affect.}   LAB RESULTS:  CMP     Component Value Date/Time   NA 140 02/21/2020 1147   NA 142 01/15/2018 1509   K 4.9 02/21/2020 1147   CL 105 02/21/2020 1147   CO2 28 02/21/2020 1147   GLUCOSE 82 02/21/2020 1147   BUN 10 02/21/2020 1147   BUN 16 01/15/2018 1509   CREATININE 0.71 02/21/2020 1147   CALCIUM 9.3 02/21/2020 1147   PROT 6.3 02/21/2020 1147   PROT 6.8 01/15/2018 1509   ALBUMIN 3.7 11/25/2019 1521   ALBUMIN 4.3 01/15/2018 1509   AST 15 02/21/2020 1147   AST 15 11/25/2019 1521   ALT 13 02/21/2020 1147   ALT 13 11/25/2019 1521   ALKPHOS 92 11/25/2019 1521   BILITOT 0.6 02/21/2020 1147   BILITOT 0.3 11/25/2019 1521   GFRNONAA 92 02/21/2020 1147   GFRAA 107 02/21/2020 1147    Lab Results  Component Value Date   ALBUMINELP 4.1 05/11/2018   A1GS 0.4 (H) 05/11/2018   A2GS 0.8 05/11/2018   BETS 0.5 05/11/2018   BETA2SER 0.4 05/11/2018   GAMS 0.8  05/11/2018   SPEI  05/11/2018     Comment:     . A poorly-defined band of restricted protein mobility is detected in the  gamma globulins. It is unlikely that this may represent a monoclonal protein; however, immunofixation analysis is available if clinically indicated. .     Lab Results  Component Value Date   WBC 5.5 02/21/2020   NEUTROABS 3,069 02/21/2020   HGB 15.2 02/21/2020   HCT 45.2 (H) 02/21/2020   MCV 88.6 02/21/2020   PLT 226 02/21/2020    No results found for: LABCA2  No components found for: EAVWUJ811  No results for input(s): INR in the last 168 hours.  No results found for: LABCA2  No results found for: BJY782  No results found for: NFA213  No results found for: YQM578  No results found for: CA2729  No components found for: HGQUANT  No results found for: CEA1 / No results found for: CEA1   No results found for: AFPTUMOR  No results found for: CHROMOGRNA  No results found for: KPAFRELGTCHN, LAMBDASER, KAPLAMBRATIO (kappa/lambda light chains)  No results found for: HGBA, HGBA2QUANT, HGBFQUANT, HGBSQUAN (Hemoglobinopathy evaluation)   No results found for: LDH  No results found for: IRON, TIBC, IRONPCTSAT (Iron and TIBC)  No results found for: FERRITIN  Urinalysis    Component Value Date/Time   COLORURINE YELLOW 05/11/2018 0957   APPEARANCEUR CLEAR 05/11/2018 0957   LABSPEC 1.015 05/11/2018 0957   PHURINE < OR = 5.0 05/11/2018 0957   GLUCOSEU NEGATIVE 05/11/2018 0957   HGBUR NEGATIVE 05/11/2018 0957   BILIRUBINUR NEGATIVE 02/08/2018 1545   [place] 05/11/2018 0957   PROTEINUR NEGATIVE 05/11/2018 0957   NITRITE NEGATIVE 05/11/2018 0957   LEUKOCYTESUR NEGATIVE 05/11/2018 0957    STUDIES: No results found.   ELIGIBLE FOR AVAILABLE RESEARCH PROTOCOL: Not a COMET candidate given presence of a mass  ASSESSMENT: 60 y.o. [name], [name] woman status post left breast biopsy 06/13/2019 for ductal carcinoma in situ, grade 2,  estrogen and progesterone receptor positive.  (a) MRI biopsy of a 0.8 cm left breast mass 07/07/2019, results pending  (1) status post left lumpectomy and sentinel lymph node sampling 08/30/2019 for a pT1c pN0, stage IA invasive ductal carcinoma, grade 1, with DCIS focally involving the inferior margin  (a) a total of 4 left axillary lymph nodes were removed.   (b) inferior margin successfully cleared with additional surgery 10/07/2019  (2) Oncotype score of 15 predicts a risk of breast cancer recurrence outside the breast in the next 9 years of 4% if the patient's only systemic therapy is antiestrogens.  It predicts no benefit from chemotherapy.  (3) adjuvant radiation, completed 10/20 treatments  (4) antiestrogens to start 01/30/2020 with Tamoxifen   PLAN: [name] is doing quite well today.  She has no clinical sign of breast cancer recurrence.  She completed 10/20 of her radiation appointments due to transportation and mental health issues.  At this point, it would not be beneficial to send her back to radiation therapy.  She will go ahead and start anti estrogen therapy with tamoxifen.  We reviewed risks and benefits in detail.  I gave her a handout in her AVS about this.   She will return in 3 months for labs and f/u with Dr. [name] and in 6 months for a SCP visit.  She knows to call for any questions that may arise between now and her next appointment.  We are happy to see her sooner if needed.  Total encounter time: 30 minutes*   Gustav C. Magrinat, MD 05/02/20 10:02 AM Medical Oncology and Hematology [facility] [place], [place]  [place] Tel. [phone]    Fax. [phone]   I, Wilburn Mylar, am acting as scribe for Dr. Virgie Dad. Magrinat.  {Add scribe attestation statement}   *Total Encounter Time as defined by the Centers for Medicare and Medicaid Services includes, in addition to the face-to-face time of a patient visit (documented in  the note above) non-face-to-face time: obtaining and reviewing outside history, ordering and reviewing medications, tests or procedures, care coordination (communications with other health care professionals or caregivers) and documentation in the medical record.

## 2020-05-03 ENCOUNTER — Ambulatory Visit: Payer: Medicare Other | Admitting: Internal Medicine

## 2020-05-23 ENCOUNTER — Other Ambulatory Visit: Payer: Self-pay | Admitting: Physician Assistant

## 2020-05-23 ENCOUNTER — Other Ambulatory Visit (HOSPITAL_COMMUNITY): Payer: Self-pay | Admitting: Rheumatology

## 2020-05-23 ENCOUNTER — Telehealth: Payer: Self-pay

## 2020-05-23 DIAGNOSIS — Z79899 Other long term (current) drug therapy: Secondary | ICD-10-CM

## 2020-05-23 DIAGNOSIS — M0579 Rheumatoid arthritis with rheumatoid factor of multiple sites without organ or systems involvement: Secondary | ICD-10-CM

## 2020-05-23 NOTE — Telephone Encounter (Signed)
TB Gold was due February 2022.  Please advise patient to come in for CBC,CMP.

## 2020-05-23 NOTE — Telephone Encounter (Signed)
Please call patient to schedule appt, Thank you.   Return in about 5 months (around 07/21/2020) for Rheumatoid arthritis.

## 2020-05-23 NOTE — Telephone Encounter (Signed)
Next Visit: message sent to front desk to schedule appt,  Return in about 5 months (around 07/21/2020) for Rheumatoid arthritis.  Last Visit: 02/21/2020  Last Fill: 02/21/2020  DX: Rheumatoid arthritis involving multiple sites with positive rheumatoid factor   Current Dose per office note 02/21/2020, Enbrel 50 mg subcutaneous injections once weekly, Otrexup 20 mg subcutaneous injections once weekly  Labs: 02/21/2020, Hematocrit is borderline elevated. Rest of CBC WNL. CMP WNL.   [facility] labs are due.  TB Gold: 05/03/2019, negative  Okay to refill Enbrel and Otrexup?

## 2020-05-23 NOTE — Telephone Encounter (Signed)
Returned call to pt . Pt had question about date and time of her next appointment. Gave pt information. Pt acknowledged .

## 2020-05-24 NOTE — Telephone Encounter (Signed)
[facility] again labs

## 2020-05-24 NOTE — Addendum Note (Signed)
Addended by: Shona Needles on: 05/24/2020 07:35 AM   Modules accepted: Orders

## 2020-05-28 MED FILL — TAMOXIFEN 20 MG TABLET: 20 | 90 days supply | Qty: 90 | Fill #1

## 2020-05-28 MED FILL — OTREXUP 20 MG/0.4 ML AUTO-I: 20 | 84 days supply | Qty: 5 | Fill #0

## 2020-05-28 MED FILL — ENBREL MINI 50 MG/ML SOCT: 50 | 28 days supply | Qty: 4 | Fill #0

## 2020-06-05 ENCOUNTER — Telehealth: Payer: Self-pay | Admitting: Internal Medicine

## 2020-06-05 ENCOUNTER — Other Ambulatory Visit (HOSPITAL_COMMUNITY): Payer: Self-pay

## 2020-06-05 NOTE — Telephone Encounter (Signed)
Copied from [place] [phone]. Topic: General - Other >> Jun 04, 2020  1:02 PM Tessa Lerner A wrote: Reason for CRM: Patient would like to be contacted regarding emotional wellness and medication  Please contact to advise further  Patient has an office appointment set for 08/03/20

## 2020-06-06 ENCOUNTER — Telehealth: Payer: Self-pay

## 2020-06-06 NOTE — Telephone Encounter (Signed)
Reached out for clarification for on her request for emotional support. LVM for patient.

## 2020-06-06 NOTE — Telephone Encounter (Signed)
Contacted patient by phone to discuss details for emotional support and how to assist. LVM for the patient to contact Care Coordinator at [facility].

## 2020-06-07 ENCOUNTER — Telehealth: Payer: Self-pay

## 2020-06-07 NOTE — Telephone Encounter (Signed)
Pt called in returning my phone call concerning her request for emotional support. Shared with patient some behavioral health resources in the area. Pt discussed feeling anxious and looking for housing resources. Pt stated they have a dentist appointment coming up in a few days and plans to visit the [facility] in then.

## 2020-06-21 ENCOUNTER — Other Ambulatory Visit (HOSPITAL_COMMUNITY): Payer: Self-pay

## 2020-06-25 ENCOUNTER — Other Ambulatory Visit (HOSPITAL_COMMUNITY): Payer: Self-pay

## 2020-07-02 ENCOUNTER — Other Ambulatory Visit (HOSPITAL_COMMUNITY): Payer: Self-pay

## 2020-07-02 ENCOUNTER — Other Ambulatory Visit: Payer: Self-pay | Admitting: Rheumatology

## 2020-07-02 MED ORDER — ENBREL MINI 50 MG/ML ~~LOC~~ SOCT
50.0000 mg | SUBCUTANEOUS | 0 refills | Status: DC
  Filled 2020-07-02: qty 4, fill #0
  Filled 2020-07-02: qty 4, 28d supply, fill #0

## 2020-07-02 NOTE — Telephone Encounter (Signed)
Next Visit: 07/30/2020  Last Visit: 02/21/2020  Last Fill: 05/23/2020  DX: Rheumatoid arthritis involving multiple sites with positive rheumatoid factor   Current Dose per office note 02/21/2020, Enbrel 50 mg subcutaneous injections once weekly   Labs: 02/21/2020, Hematocrit is borderline elevated. Rest of CBC WNL. CMP WNL  TB Gold: 05/03/2019, negative  [facility] labs are due.  Okay to refill Enbrel?

## 2020-07-04 ENCOUNTER — Other Ambulatory Visit (HOSPITAL_COMMUNITY): Payer: Self-pay

## 2020-07-16 NOTE — Progress Notes (Signed)
Office Visit Note  Patient: [name]             Date of Birth: November 10, 1960           MRN: [id number]             PCP: Ladell Pier, MD Referring: Ladell Pier, MD Visit Date: 07/30/2020 Occupation: @GUAROCC @  Subjective:  Patient management.   History of Present Illness: [name] is a 60 y.o. female with history of seropositive rheumatoid arthritis and osteoarthritis.  She returns today for follow-up visit.  She states that after she finished radiation therapy for her breast cancer she resumed methotrexate.  She has been taking methotrexate on a regular basis.  She has been also taking Enbrel on a regular basis.  She states she missed couple of dose of Enbrel in the last 2 months.  She experiences some discomfort in her hands.  She ports some shortness of breath which she relates to allergies.  She has an appointment coming up with her PCP.  Activities of Daily Living:  Patient reports morning stiffness for 1.5 hours.   Patient Reports nocturnal pain.  Difficulty dressing/grooming: Reports Difficulty climbing stairs: Denies Difficulty getting out of chair: Denies Difficulty using hands for taps, buttons, cutlery, and/or writing: Reports  Review of Systems  Constitutional: Positive for fatigue. Negative for night sweats, weight gain and weight loss.  HENT: Positive for mouth dryness. Negative for mouth sores, trouble swallowing, trouble swallowing and nose dryness.   Eyes: Negative for pain, redness, itching, visual disturbance and dryness.  Respiratory: Positive for cough and shortness of breath. Negative for difficulty breathing.        Allergies  Cardiovascular: Negative for chest pain, palpitations, hypertension, irregular heartbeat and swelling in legs/feet.  Gastrointestinal: Negative for blood in stool, constipation and diarrhea.  Endocrine: Positive for increased urination.  Genitourinary: Negative for difficulty urinating and vaginal dryness.   Musculoskeletal: Positive for arthralgias, joint pain, joint swelling and morning stiffness. Negative for myalgias, muscle weakness, muscle tenderness and myalgias.  Skin: Negative for color change, rash, hair loss, redness, skin tightness, ulcers and sensitivity to sunlight.  Allergic/Immunologic: Negative for susceptible to infections.  Neurological: Positive for headaches. Negative for dizziness, numbness, memory loss, night sweats and weakness.  Hematological: Positive for bruising/bleeding tendency. Negative for swollen glands.  Psychiatric/Behavioral: Negative for depressed mood, confusion and sleep disturbance. The patient is not nervous/anxious.     PMFS History:  Patient Active Problem List   Diagnosis Date Noted  . Malignant neoplasm of upper-outer quadrant of left breast in female, estrogen receptor positive ([place]) 09/15/2019  . 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 Adenomatous colon polyp 03/16/2019  . Degenerative joint disease of shoulder region 06/08/2018  . Rheumatoid arthritis involving both hands with positive rheumatoid factor ([place]) 06/08/2018  . Rheumatoid arthritis involving both feet with positive rheumatoid factor ([place]) 06/08/2018  . 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 Status post total hip replacement, 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  . Smoker 06/08/2018  . Anxiety and depression 06/08/2018  . Family history of breast cancer 03/25/2018  . 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 Rheumatoid arthritis involving multiple sites ([place]) 01/15/2018  . Tobacco dependence 01/15/2018  . Opioid use agreement exists 01/15/2018  . Positive depression screening 01/15/2018  . Homeless 01/15/2018    Past Medical History:  Diagnosis Date  . Anemia    during pregnancy  . Anxiety   . Asthma   . Bronchitis age  18  . Cancer ([place])    left breast ca  . COPD (chronic obstructive pulmonary disease) ([place])   . Depression   . Hx of bladder infections    been over 5 years  since last one   . Rheumatoid arthritis ([place])    unc rheum   . Tobacco abuse   . Trouble in sleeping     Family History  Problem Relation Age of Onset  . Breast cancer Mother   . Cervical cancer Mother   . Cancer Brother   . Healthy Son    Past Surgical History:  Procedure Laterality Date  . BREAST LUMPECTOMY WITH RADIOACTIVE SEED AND SENTINEL LYMPH NODE BIOPSY Left 08/30/2019   Procedure: LEFT BREAST LUMPECTOMY X 2 WITH RADIOACTIVE SEED AND SENTINEL LYMPH NODE BIOPSY;  Surgeon: [name], MD;  Location: [facility];  Service: General;  Laterality: Left;  PEC BLOCK  . COLONOSCOPY WITH PROPOFOL N/A 03/15/2019   Procedure: COLONOSCOPY WITH PROPOFOL;  Surgeon: Jonathon Bellows, MD;  Location: [facility] ENDOSCOPY;  Service: Gastroenterology;  Laterality: N/A;  . HIP ARTHROPLASTY    . JOINT REPLACEMENT    . MULTIPLE TOOTH EXTRACTIONS    . RE-EXCISION OF BREAST LUMPECTOMY Left 10/07/2019   Procedure: RE-EXCISION OF LEFT BREAST INFERIOR MARGIN;  Surgeon: [name], MD;  Location: [facility];  Service: General;  Laterality: Left;  . TOTAL HIP ARTHROPLASTY Left 02/15/2018   Procedure: TOTAL HIP ARTHROPLASTY ANTERIOR APPROACH;  Surgeon: [name], MD;  Location: [facility];  Service: Orthopedics;  Laterality: Left;  . WISDOM TOOTH EXTRACTION     Social History   Social History Narrative  . Not on file   Immunization History  Administered Date(s) Administered  . PFIZER(Purple Top)SARS-COV-2 Vaccination 06/27/2019, 07/19/2019  . Pneumococcal Conjugate-13 07/14/2018  . Tdap 03/25/2018  . Zoster Recombinat (Shingrix) 07/14/2018     Objective: Vital Signs: BP 101/69 (BP Location: Left Arm, Patient Position: Sitting, Cuff Size: Normal)   Pulse 97   Ht 5' 5.5" (1.664 m)   Wt 103 lb 6.4 oz (46.9 kg)   BMI 16.94 kg/m    Physical Exam Vitals and nursing note reviewed.  Constitutional:      Appearance: She is well-developed.  HENT:     Head: Normocephalic and atraumatic.  Eyes:      Conjunctiva/sclera: Conjunctivae normal.  Cardiovascular:     Rate and Rhythm: Normal rate and regular rhythm.     Heart sounds: Normal heart sounds.  Pulmonary:     Effort: Pulmonary effort is normal.     Breath sounds: Normal breath sounds.  Abdominal:     General: Bowel sounds are normal.     Palpations: Abdomen is soft.  Musculoskeletal:     Cervical back: Normal range of motion.  Lymphadenopathy:     Cervical: No cervical adenopathy.  Skin:    General: Skin is warm and dry.     Capillary Refill: Capillary refill takes less than 2 seconds.  Neurological:     Mental Status: She is alert and oriented to person, place, and time.  Psychiatric:        Behavior: Behavior normal.      Musculoskeletal Exam: Good range of motion of her cervical spine.  She had some stiffness and range of motion of her shoulders.  Elbow joints and wrist joints with good range of motion.  She synovial thickening over MCPs with no synovitis.  PIP and DIP thickening was noted.  There was no tenderness  over knee joints ankles or MTPs.  Hip joints with good range of motion.  No synovitis was noted.  CDAI Exam: CDAI Score: 0.4  Patient Global: 2 mm; Provider Global: 2 mm Swollen: 0 ; Tender: 0  Joint Exam 07/30/2020   No joint exam has been documented for this visit   There is currently no information documented on the homunculus. Go to the Rheumatology activity and complete the homunculus joint exam.  Investigation: No additional findings.  Imaging: No results found.  Recent Labs: Lab Results  Component Value Date   WBC 5.5 02/21/2020   HGB 15.2 02/21/2020   PLT 226 02/21/2020   NA 140 02/21/2020   K 4.9 02/21/2020   CL 105 02/21/2020   CO2 28 02/21/2020   GLUCOSE 82 02/21/2020   BUN 10 02/21/2020   CREATININE 0.71 02/21/2020   BILITOT 0.6 02/21/2020   ALKPHOS 92 11/25/2019   AST 15 02/21/2020   ALT 13 02/21/2020   PROT 6.3 02/21/2020   ALBUMIN 3.7 11/25/2019   CALCIUM 9.3  02/21/2020   GFRAA 107 02/21/2020   QFTBGOLDPLUS NEGATIVE 05/03/2019    Speciality Comments: No specialty comments available.  Procedures:  No procedures performed Allergies: Other   Assessment / Plan:     Visit Diagnoses: Rheumatoid arthritis involving multiple sites with positive rheumatoid factor (HCC)-patient had no synovitis on examination today.  She states she has been taking Enbrel on a regular basis but misses some doses occasionally.  She has been taking methotrexate on a regular basis.  High risk medication use - Enbrel 50 mg subcutaneous injections once weekly (started on 05/10/2019), Otrexup 20 mg subcutaneous injections once weekly, and folic acid 2 mg by mouth daily -she had been noncompliant with the labs.  Present for having labs on a regular basis was emphasized.  Plan: CBC with Differential/Platelet, COMPLETE METABOLIC PANEL WITH GFR, QuantiFERON-TB Gold Plus today and then every 3 months to monitor for drug toxicity.  She has been advised to see dermatologist on an annual basis to screen for nonmelanoma skin cancer.  Information regarding COVID-19 vaccination, pneumococcal and Shingrix vaccination was given.  Have advised her to discuss this further with her PCP.  She was advised to discontinue Enbrel and methotrexate in case she develops an infection.  She may resume both medications once the infection resolves.  Other secondary osteoarthritis of both shoulders-she has a stiffness range of motion of her shoulder joints.  Status post total hip replacement, left-she had good range of motion.  Smoker-smoking cessation was emphasized.  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 Underwent lumpectomy 08/30/19 and radiation therapy-completed 12/28/19.  Family history of breast cancer  Anxiety and depression  Orders: Orders Placed This Encounter  Procedures  . CBC with Differential/Platelet  . COMPLETE METABOLIC PANEL WITH GFR  . QuantiFERON-TB  Gold Plus   No orders of the defined types were placed in this encounter.     Follow-Up Instructions: Return in about 3 months (around 10/30/2020) for Rheumatoid arthritis.   Bo Merino, MD  Note - This record has been created using Editor, commissioning.  Chart creation errors have been sought, but may not always  have been located. Such creation errors do not reflect on  the standard of medical care.

## 2020-07-23 ENCOUNTER — Telehealth: Payer: Self-pay | Admitting: Adult Health

## 2020-07-23 NOTE — Telephone Encounter (Signed)
R/s per 5/23 los, left message

## 2020-07-25 ENCOUNTER — Telehealth: Payer: Self-pay | Admitting: Adult Health

## 2020-07-25 NOTE — Telephone Encounter (Signed)
Pt called and left vm on my phone on 5/16 about appt. Called pt back, no answer. Left msg for pt to call back with any questions or concerns regarding her appt.

## 2020-07-30 ENCOUNTER — Encounter: Payer: Self-pay | Admitting: Rheumatology

## 2020-07-30 ENCOUNTER — Other Ambulatory Visit: Payer: Self-pay

## 2020-07-30 ENCOUNTER — Encounter: Payer: Medicare Other | Admitting: Adult Health

## 2020-07-30 ENCOUNTER — Ambulatory Visit (INDEPENDENT_AMBULATORY_CARE_PROVIDER_SITE_OTHER): Payer: Medicare Other | Admitting: Rheumatology

## 2020-07-30 VITALS — BP 101/69 | HR 97 | Ht 65.5 in | Wt 103.4 lb

## 2020-07-30 DIAGNOSIS — F17200 Nicotine dependence, unspecified, uncomplicated: Secondary | ICD-10-CM

## 2020-07-30 DIAGNOSIS — Z96642 Presence of left artificial hip joint: Secondary | ICD-10-CM

## 2020-07-30 DIAGNOSIS — Z803 Family history of malignant neoplasm of breast: Secondary | ICD-10-CM

## 2020-07-30 DIAGNOSIS — M19211 Secondary osteoarthritis, right shoulder: Secondary | ICD-10-CM | POA: Diagnosis not present

## 2020-07-30 DIAGNOSIS — M0579 Rheumatoid arthritis with rheumatoid factor of multiple sites without organ or systems involvement: Secondary | ICD-10-CM

## 2020-07-30 DIAGNOSIS — F32A Depression, unspecified: Secondary | ICD-10-CM

## 2020-07-30 DIAGNOSIS — Z79899 Other long term (current) drug therapy: Secondary | ICD-10-CM

## 2020-07-30 DIAGNOSIS — F419 Anxiety disorder, unspecified: Secondary | ICD-10-CM

## 2020-07-30 DIAGNOSIS — M19212 Secondary osteoarthritis, left shoulder: Secondary | ICD-10-CM

## 2020-07-30 DIAGNOSIS — M24522 Contracture, left elbow: Secondary | ICD-10-CM

## 2020-07-30 DIAGNOSIS — D0512 Intraductal carcinoma in situ of left breast: Secondary | ICD-10-CM

## 2020-07-30 NOTE — Patient Instructions (Addendum)
Standing Labs We placed an order today for your standing lab work.   Please have your standing labs drawn in August and every 3 months  If possible, please have your labs drawn 2 weeks prior to your appointment so that the provider can discuss your results at your appointment.  Please note that you may see your imaging and lab results in [facility] before we have reviewed them. We may be awaiting multiple results to interpret others before contacting you. Please allow our office up to 72 hours to thoroughly review all of the results before contacting the office for clarification of your results.  We have open lab daily: Monday through Thursday from 1:30-4:30 PM and Friday from 1:30-4:00 PM at the office of Dr. Bo Merino, [facility].   Please be advised, all patients with office appointments requiring lab work will take precedent over walk-in lab work.  If possible, please come for your lab work on Monday and Friday afternoons, as you may experience shorter wait times. The office is located at [address] [place], [place], [place] [address] No appointment is necessary.   Labs are drawn by [facility]. Please bring your co-pay at the time of your lab draw.  You may receive a bill from [facility] for your lab work.  If you wish to have your labs drawn at another location, please call the office 24 hours in advance to send orders.  If you have any questions regarding directions or hours of operation,  please call [phone].   As a reminder, please drink plenty of water prior to coming for your lab work. Thanks!  Please see a dermatologist every year to screen for nonmelanoma skin cancer   Please discontinue Enbrel and methotrexate in case you develop an infection.  You may resume both medications once and for infection resolves.  If you test POSITIVE for COVID19 and have MILD to MODERATE symptoms: o First, call your PCP if you would like to receive COVID19 treatment  AND o Hold your medications during the infection and for at least 1 week after your symptoms have resolved: - Injectable medication (Benlysta, Cimzia, Cosentyx, Enbrel, Humira, Orencia, Remicade, Simponi, Stelara, Taltz, Tremfya) - Methotrexate - Leflunomide (Arava) - Mycophenolate (Cellcept) - Morrie Sheldon, Olumiant, or Rinvoq o If you take Actemra or Kevzara, you DO NOT need to hold these for COVID19 infection.  If you test POSITIVE for COVID19 and have NO symptoms: o First, call your PCP if you would like to receive COVID19 treatment AND o Hold your medications for at least 10 days after the day that you tested positive - Injectable medication (Benlysta, Cimzia, Cosentyx, Enbrel, Humira, Orencia, Remicade, Simponi, Stelara, Taltz, Tremfya) - Methotrexate - Leflunomide (Arava) - Mycophenolate (Cellcept)   Vaccines You are taking a medication(s) that can suppress your immune system.  The following immunizations are recommended: . Flu annually . Covid-19  . Td/Tdap (tetanus, diphtheria, pertussis) every 10 years . Pneumonia (Prevnar 15 then Pneumovax 23 at least 1 year apart.  Alternatively, can take Prevnar 20 without needing additional dose) . Shingrix (after age 44): 2 doses from 4 weeks to 6 months apart  Please check with your PCP to make sure you are up to date.

## 2020-08-02 ENCOUNTER — Inpatient Hospital Stay: Payer: Medicare Other | Attending: Adult Health | Admitting: Adult Health

## 2020-08-02 DIAGNOSIS — Z170 Estrogen receptor positive status [ER+]: Secondary | ICD-10-CM | POA: Diagnosis not present

## 2020-08-02 DIAGNOSIS — D0512 Intraductal carcinoma in situ of left breast: Secondary | ICD-10-CM | POA: Diagnosis not present

## 2020-08-02 DIAGNOSIS — Z87891 Personal history of nicotine dependence: Secondary | ICD-10-CM | POA: Diagnosis not present

## 2020-08-02 DIAGNOSIS — C50412 Malignant neoplasm of upper-outer quadrant of left female breast: Secondary | ICD-10-CM

## 2020-08-02 LAB — COMPLETE METABOLIC PANEL WITH GFR
AG Ratio: 1.5 (calc) (ref 1.0–2.5)
ALT: 13 U/L (ref 6–29)
AST: 14 U/L (ref 10–35)
Albumin: 4.1 g/dL (ref 3.6–5.1)
Alkaline phosphatase (APISO): 78 U/L (ref 37–153)
BUN: 13 mg/dL (ref 7–25)
CO2: 33 mmol/L — ABNORMAL HIGH (ref 20–32)
Calcium: 9.6 mg/dL (ref 8.6–10.4)
Chloride: 99 mmol/L (ref 98–110)
Creat: 0.74 mg/dL (ref 0.50–0.99)
GFR, Est African American: 101 mL/min/{1.73_m2} (ref >=60)
GFR, Est Non African American: 87 mL/min/{1.73_m2} (ref >=60)
Globulin: 2.7 g/dL (calc) (ref 1.9–3.7)
Glucose, Bld: 125 mg/dL — ABNORMAL HIGH (ref 65–99)
Potassium: 4.3 mmol/L (ref 3.5–5.3)
Sodium: 140 mmol/L (ref 135–146)
Total Bilirubin: 0.5 mg/dL (ref 0.2–1.2)
Total Protein: 6.8 g/dL (ref 6.1–8.1)

## 2020-08-02 LAB — CBC WITH DIFFERENTIAL/PLATELET
Absolute Monocytes: 158 cells/uL — ABNORMAL LOW (ref 200–950)
Basophils Absolute: 30 cells/uL (ref 0–200)
Basophils Relative: 0.4 %
Eosinophils Absolute: 90 cells/uL (ref 15–500)
Eosinophils Relative: 1.2 %
HCT: 42.4 % (ref 35.0–45.0)
Hemoglobin: 14.4 g/dL (ref 11.7–15.5)
Lymphs Abs: 1313 cells/uL (ref 850–3900)
MCH: 30.6 pg (ref 27.0–33.0)
MCHC: 34 g/dL (ref 32.0–36.0)
MCV: 90 fL (ref 80.0–100.0)
MPV: 10.2 fL (ref 7.5–12.5)
Monocytes Relative: 2.1 %
Neutro Abs: 5910 cells/uL (ref 1500–7800)
Neutrophils Relative %: 78.8 %
Platelets: 228 10*3/uL (ref 140–400)
RBC: 4.71 10*6/uL (ref 3.80–5.10)
RDW: 13.6 % (ref 11.0–15.0)
Total Lymphocyte: 17.5 %
WBC: 7.5 10*3/uL (ref 3.8–10.8)

## 2020-08-02 LAB — QUANTIFERON-TB GOLD PLUS
Mitogen-NIL: 8.47 IU/mL
NIL: 0.09 IU/mL
QuantiFERON-TB Gold Plus: NEGATIVE
TB1-NIL: <0 IU/mL
TB2-NIL: <0 IU/mL

## 2020-08-02 NOTE — Progress Notes (Signed)
SURVIVORSHIP VISIT: I connected with [name] on 08/02/20 at  3:30 PM EDT by telephone and verified that I am speaking with the correct person using two identifiers.  I discussed the limitations, risks, security and privacy concerns of performing an evaluation and management service by telephone and the availability of in person appointments.  I also discussed with the patient that there may be a patient responsible charge related to this service. The patient expressed understanding and agreed to proceed.  Patient location: home Provider location: [facility] office Others participating in call: none   BRIEF ONCOLOGIC HISTORY:  Oncology History  [name]  06/13/2019 Cancer Staging   Staging form: Breast, AJCC 8th Edition - Clinical stage from 06/13/2019: Stage 0 (cTis (DCIS), cN0, cM0, ER+, PR+)   06/15/2019 Initial Diagnosis   [name]     INTERVAL HISTORY:  [name] to review her survivorship care plan detailing her treatment course for breast cancer, as well as monitoring long-term side effects of that treatment, education regarding health maintenance, screening, and overall wellness and health promotion.     Overall, [name] reports feeling moderately well.  She notes that she has been having a mild breathlesness on occasion since receiving the COVID vaccine.  She plans on following up with her PCP, Dr. Wynetta Emery about this.  She is taking tamoxifen daily and is tolerating it well.  She is mores stressed lately, and notes that it is her personal life and stable.  REVIEW OF SYSTEMS:  Review of Systems  Constitutional: Positive for fatigue. Negative for appetite change, chills, fever and unexpected weight change.  HENT:   Negative for hearing loss, lump/mass and trouble swallowing.   Eyes: Negative for eye problems and icterus.  Respiratory: Negative for chest tightness, cough and shortness of breath.   Cardiovascular:  Negative for chest pain, leg swelling and palpitations.  Gastrointestinal: Negative for abdominal distention, abdominal pain, constipation, diarrhea, nausea and vomiting.  Endocrine: Negative for hot flashes.  Genitourinary: Negative for difficulty urinating.   Musculoskeletal: Negative for arthralgias.  Skin: Negative for itching and rash.  Neurological: Negative for dizziness, extremity weakness, headaches and numbness.  Hematological: Negative for adenopathy. Does not bruise/bleed easily.  Psychiatric/Behavioral: Negative for depression. The patient is nervous/anxious.   Breast: Denies any new nodularity, masses, tenderness, nipple changes, or nipple discharge.      ONCOLOGY TREATMENT TEAM:  1. Surgeon:  Dr. [name] at [facility] 2. Medical Oncologist: Dr. [name]  3. Radiation Oncologist: Dr. [name]    PAST MEDICAL/SURGICAL HISTORY:  Past Medical History:  Diagnosis Date  . Anemia    during pregnancy  . Anxiety   . Asthma   . Bronchitis age 28  . Cancer ([place])    left breast ca  . COPD (chronic obstructive pulmonary disease) ([place])   . Depression   . Hx of bladder infections    been over 5 years since last one   . Rheumatoid arthritis ([place])    unc rheum   . Tobacco abuse   . Trouble in sleeping    Past Surgical History:  Procedure Laterality Date  . BREAST LUMPECTOMY WITH RADIOACTIVE SEED AND SENTINEL LYMPH NODE BIOPSY Left 08/30/2019   Procedure: LEFT BREAST LUMPECTOMY X 2 WITH RADIOACTIVE SEED AND SENTINEL LYMPH NODE BIOPSY;  Surgeon: [name], MD;  Location: [place];  Service: General;  Laterality: Left;  PEC BLOCK  . COLONOSCOPY WITH PROPOFOL N/A  03/15/2019   Procedure: COLONOSCOPY WITH PROPOFOL;  Surgeon: Jonathon Bellows, MD;  Location: [facility];  Service: Gastroenterology;  Laterality: N/A;  . HIP ARTHROPLASTY    . JOINT REPLACEMENT    . MULTIPLE TOOTH EXTRACTIONS    . RE-EXCISION OF BREAST LUMPECTOMY Left 10/07/2019   Procedure: RE-EXCISION OF  LEFT BREAST INFERIOR MARGIN;  Surgeon: [name], MD;  Location: [place];  Service: General;  Laterality: Left;  . TOTAL HIP ARTHROPLASTY Left 02/15/2018   Procedure: TOTAL HIP ARTHROPLASTY ANTERIOR APPROACH;  Surgeon: [name], MD;  Location: [facility];  Service: Orthopedics;  Laterality: Left;  . WISDOM TOOTH EXTRACTION       ALLERGIES:  Allergies  Allergen Reactions  . Other Rash    Unknown cleaner used in OR     CURRENT MEDICATIONS:  Outpatient Encounter Medications as of 08/02/2020  Medication Sig  . albuterol (VENTOLIN HFA) 108 (90 Base) MCG/ACT inhaler TAKE 2 PUFFS BY MOUTH EVERY 6 HOURS AS NEEDED FOR WHEEZE OR SHORTNESS OF BREATH  . Cholecalciferol (VITAMIN D3) 75 MCG (3000 UT) TABS Take 3,000 Units by mouth daily.  . cyanocobalamin 100 MCG tablet Take 100 mcg by mouth daily.  . Etanercept (ENBREL MINI) 50 MG/ML SOCT INJECT 50 MG INTO THE SKIN ONCE A WEEK.  . folic acid (FOLVITE) 1 MG tablet TAKE 2 TABLETS BY MOUTH ONCE DAILY  . Multiple Vitamin (MULTIVITAMIN WITH MINERALS) TABS tablet Take 1 tablet by mouth daily.  . nicotine (NICODERM CQ - DOSED IN MG/24 HOURS) 14 mg/24hr patch PLACE 1 PATCH (14 MG TOTAL) ONTO THE SKIN DAILY.  . OTREXUP 20 MG/0.4ML SOAJ INJECT 20 MG INTO THE SKIN ONCE A WEEK.  . SYMBICORT 80-4.5 MCG/ACT inhaler TAKE 2 PUFFS BY MOUTH TWICE A DAY  . tamoxifen (NOLVADEX) 20 MG tablet TAKE 1 TABLET BY MOUTH ONCE A DAY   No facility-administered encounter medications on file as of 08/02/2020.     ONCOLOGIC FAMILY HISTORY:  Family History  Problem Relation Age of Onset  . Breast cancer Mother   . Cervical cancer Mother   . Cancer Brother   . Healthy Son      GENETIC COUNSELING/TESTING: Referred to genetics   SOCIAL HISTORY:  Social History   Socioeconomic History  . Marital status: Single    Spouse name: Not on file  . Number of children: 1  . Years of education: Not on file  . Highest education level: Not on file   Occupational History  . Not on file  Tobacco Use  . Smoking status: Current Some Day Smoker    Packs/day: 0.25    Types: Cigarettes    Last attempt to quit: 09/08/2019    Years since quitting: 0.9  . Smokeless tobacco: Never Used  . Tobacco comment: on nicotine patches, rarely smokes  Vaping Use  . Vaping Use: Never used  Substance and Sexual Activity  . Alcohol use: Yes    Comment: rarely   . Drug use: Not Currently    Types: Cocaine    Comment: last used in 2004  . Sexual activity: Not on file  Other Topics Concern  . Not on file  Social History Narrative  . Not on file   Social Determinants of Health   Financial Resource Strain: Not on file  Food Insecurity: Not on file  Transportation Needs: Not on file  Physical Activity: Not on file  Stress: Not on file  Social Connections: Not on file  Intimate Partner Violence:  Not on file     OBSERVATIONS/OBJECTIVE:   LABORATORY DATA:  None for this visit.  DIAGNOSTIC IMAGING:  None for this visit.      ASSESSMENT AND PLAN:  Ms.. Diers is a pleasant 60 y.o. female with Stage IA left breast invasive ductal carcinoma, ER+/PR+/HER2-, diagnosed in 06/2019, treated with lumpectomy, adjuvant radiation therapy, and anti-estrogen therapy with Tamoxifen beginning in 01/2020.  She presents to the Survivorship Clinic for our initial meeting and routine follow-up post-completion of treatment for breast cancer.    1. Stage IA left breast cancer:  Ms. Mcneish is continuing to recover from definitive treatment for breast cancer. She will follow-up with her medical oncologist, Dr. [name] in 3 months with history and physical exam per surveillance protocol.  She will continue her anti-estrogen therapy with Tamoxifen. Thus far, she is tolerating the Tamoxifen well, with minimal side effects. She was instructed to make Dr. Lindi Adie or myself aware if she begins to experience any worsening side effects of the medication and I could see her back in  clinic to help manage those side effects, as needed. Her mammogram is due; orders placed today. She requested a genetic counseling referral today and I placed that for her to talk with our genetic counselors.   Today, a comprehensive survivorship care plan and treatment summary was reviewed with the patient today detailing her breast cancer diagnosis, treatment course, potential late/long-term effects of treatment, appropriate follow-up care with recommendations for the future, and patient education resources.  A copy of this summary, along with a letter will be sent to the patient's primary care provider via mail/fax/In Basket message after today's visit.    2. Bone health:  She was given education on specific activities to promote bone health.  3. Cancer screening:  Due to Ms. Sacra history and her age, she should receive screening for skin cancers, colon cancer, lung cancer and gynecologic cancers. I placed orders for her to undergo lung cancer screening due to her tobacco history. The information and recommendations are listed on the patient's comprehensive care plan/treatment summary and were reviewed in detail with the patient.    4. Health maintenance and wellness promotion: Ms. Coppock was encouraged to consume 5-7 servings of fruits and vegetables per day. We reviewed the "Nutrition Rainbow" handout, as well as the handout "Take Control of Your Health and Reduce Your Cancer Risk" from the [place].  She was also encouraged to engage in moderate to vigorous exercise for 30 minutes per day most days of the week. We discussed the [employer] fitness program, which is designed for cancer survivors to help them become more physically fit after cancer treatments.  She was instructed to limit her alcohol consumption and was encouraged to stop smoking.     5. Support services/counseling: It is not uncommon for this period of the patient's cancer care trajectory to be one of many emotions  and stressors.  We discussed how this can be increasingly difficult during the times of quarantine and social distancing due to the COVID-19 pandemic.   She was given information regarding our available services and encouraged to contact me with any questions or for help enrolling in any of our support group/programs.    Follow up instructions:    -Return to cancer center in 3 months for f/u with Dr. [name]  -Mammogram due  -Lung cancer screening ordered -She is welcome to return back to the Survivorship Clinic at any time; no additional follow-up needed at this  time.  -Consider referral back to survivorship as a long-term survivor for continued surveillance  The patient was provided an opportunity to ask questions and all were answered. The patient agreed with the plan and demonstrated an understanding of the instructions.   The patient was advised to call back or seek an in-person evaluation if the symptoms worsen or if the condition fails to improve as anticipated.   I provided 28 minutes of non face-to-face telephone visit time during this encounter, and > 50% was spent counseling as documented under my assessment & plan.  Scot Dock, NP

## 2020-08-02 NOTE — Progress Notes (Signed)
CBC is normal.  Glucose is mildly elevated probably not a fasting sample.  TB Gold is negative.

## 2020-08-03 ENCOUNTER — Telehealth: Payer: Self-pay | Admitting: Oncology

## 2020-08-03 ENCOUNTER — Ambulatory Visit: Payer: Medicare Other | Admitting: Internal Medicine

## 2020-08-03 NOTE — Telephone Encounter (Signed)
Scheduled appointment per 05/26 los. Patient is aware. 

## 2020-08-07 ENCOUNTER — Other Ambulatory Visit (HOSPITAL_COMMUNITY): Payer: Self-pay

## 2020-08-08 ENCOUNTER — Telehealth: Payer: Self-pay | Admitting: Adult Health

## 2020-08-08 NOTE — Telephone Encounter (Signed)
Scheduled appts per 5/31 sch msg. Called pt multiple times, no answer and no voicemail available. Mailed updated calendar to pt.

## 2020-08-09 ENCOUNTER — Telehealth: Payer: Self-pay | Admitting: Oncology

## 2020-08-09 ENCOUNTER — Other Ambulatory Visit: Payer: Self-pay | Admitting: Internal Medicine

## 2020-08-09 DIAGNOSIS — F17200 Nicotine dependence, unspecified, uncomplicated: Secondary | ICD-10-CM

## 2020-08-09 NOTE — Telephone Encounter (Signed)
Requested medication (s) are due for refill today: expired medication  Requested medication (s) are on the active medication list: yes  Last refill:  07/07/19 #28 1 refill  Future visit scheduled: yes in 1 month  Notes to clinic:  expired medication. Called patient to review if still using patches for smoking cessation last refill noted as 08/29/19. Patient reports she stopped using Nicoderm patch and started back due to living with a smoker. Patient reports she would like to restart patches and requesting if PCP will prescribe her vitamins esp. D and B12. Informed patient appt is scheduled in 1 month and PCP will probably want to get lab work prior to ordering vitamins. Do you want to renew Nicoderm patch?    Requested Prescriptions  Pending Prescriptions Disp Refills   nicotine (NICODERM CQ - DOSED IN MG/24 HOURS) 14 mg/24hr patch [Pharmacy Med Name: NICOTINE 14 MG/24HR PATCH] 28 patch 1    Sig: PLACE 1 PATCH (14 MG TOTAL) ONTO THE SKIN DAILY.      Psychiatry:  Drug Dependence Therapy Failed - 08/09/2020 11:58 AM      Failed - Valid encounter within last 12 months    Recent Outpatient Visits           1 year ago Moderate persistent reactive airway disease without complication   [place], MD   1 year ago Other bursal cyst, right elbow   [place], MD   2 years ago Arthritis of shoulder region, right   [place], MD   2 years ago Tobacco dependence   [place], MD   2 years ago Pap smear for cervical cancer screening   [place], MD       Future Appointments             In 1 month Wynetta Emery [name], MD [place]   In 2 months [place] Blinda [place] [facility]

## 2020-08-09 NOTE — Telephone Encounter (Signed)
Will forward to provider  

## 2020-08-14 ENCOUNTER — Telehealth: Payer: Self-pay | Admitting: *Deleted

## 2020-08-14 NOTE — Telephone Encounter (Signed)
Pt left message stating she needs " something to help me relax for the CT scan- I get very hyper when I have scans. "  Note pt has been prescribed valium 2 mg with instructions to take 1/2 1 hour before exam and then may repeat.  Returned call - obtained voice mail.  Message left medication will be called in to the [place].

## 2020-08-15 ENCOUNTER — Ambulatory Visit (HOSPITAL_COMMUNITY): Payer: Medicare Other

## 2020-08-20 ENCOUNTER — Telehealth: Payer: Self-pay

## 2020-08-20 ENCOUNTER — Telehealth: Payer: Self-pay | Admitting: Genetic Counselor

## 2020-08-20 ENCOUNTER — Encounter: Payer: Medicare Other | Admitting: Genetic Counselor

## 2020-08-20 ENCOUNTER — Other Ambulatory Visit: Payer: Medicare Other

## 2020-08-20 ENCOUNTER — Other Ambulatory Visit: Payer: Self-pay | Admitting: Genetic Counselor

## 2020-08-20 DIAGNOSIS — D0512 Intraductal carcinoma in situ of left breast: Secondary | ICD-10-CM

## 2020-08-20 DIAGNOSIS — Z803 Family history of malignant neoplasm of breast: Secondary | ICD-10-CM

## 2020-08-20 NOTE — Telephone Encounter (Signed)
Patient left a voicemail requesting prescription of Prednisone.

## 2020-08-20 NOTE — Telephone Encounter (Signed)
Rescheduled genetics appointment to 6/15 at 1pm

## 2020-08-21 NOTE — Telephone Encounter (Signed)
Attempted to contact the patient and left message for patient to call the office.  

## 2020-08-22 ENCOUNTER — Other Ambulatory Visit: Payer: Self-pay

## 2020-08-22 ENCOUNTER — Other Ambulatory Visit (HOSPITAL_COMMUNITY): Payer: Self-pay

## 2020-08-22 ENCOUNTER — Inpatient Hospital Stay: Payer: Medicare Other

## 2020-08-22 ENCOUNTER — Inpatient Hospital Stay: Payer: Medicare Other | Attending: Adult Health | Admitting: Genetic Counselor

## 2020-08-22 ENCOUNTER — Encounter: Payer: Self-pay | Admitting: Licensed Clinical Social Worker

## 2020-08-22 ENCOUNTER — Ambulatory Visit (HOSPITAL_COMMUNITY): Payer: Medicare Other

## 2020-08-22 ENCOUNTER — Encounter: Payer: Self-pay | Admitting: Genetic Counselor

## 2020-08-22 DIAGNOSIS — Z801 Family history of malignant neoplasm of trachea, bronchus and lung: Secondary | ICD-10-CM | POA: Diagnosis not present

## 2020-08-22 DIAGNOSIS — Z803 Family history of malignant neoplasm of breast: Secondary | ICD-10-CM

## 2020-08-22 DIAGNOSIS — D0512 Intraductal carcinoma in situ of left breast: Secondary | ICD-10-CM | POA: Diagnosis not present

## 2020-08-22 MED ORDER — VALIUM 2 MG PO TABS: 2.0000 mg | ORAL_TABLET | Freq: Once | ORAL | 0 refills | Status: AC

## 2020-08-22 MED ORDER — DIAZEPAM 2 MG PO TABS
1.0000 mg | ORAL_TABLET | ORAL | 0 refills | Status: DC
  Filled 2020-08-22: qty 1, 1d supply, fill #0

## 2020-08-22 NOTE — Progress Notes (Signed)
[place] CSW Progress Note  Holiday representative met with patient per request of genetic counselor due to concerns about losing housing soon. CSW met with patient who reports that the person she was staying with can no longer have her stay there due to a sick family member coming to live there and lack of room for everyone. She has a son and friend in [place] but cannot live with either. She is interested in shelters or other housing options in that area. CSW provided list of shelters, housing authority, and coordinated entry line for [employer].  Also gave information on [facility] and shelters in [place]. Patient feels able to make calls to these options herself. She does have a vehicle to get to various resources as well.  No other needs at this time. Patient is welcome to contact CSW directly with any other needs.    Christeen Douglas , LCSW

## 2020-08-22 NOTE — Progress Notes (Signed)
REFERRING PROVIDER: Gardenia Phlegm, NP [place],  [place] [id number]  PRIMARY PROVIDER:  Ladell Pier, MD  PRIMARY REASON FOR VISIT:  1. [name]   2. Family history of breast cancer   3. Family history of lung cancer     HISTORY OF PRESENT ILLNESS:   [name], a 60 y.o. female, was seen for a  cancer genetics consultation at the request of Wilber Bihari, NP due to a personal and family history of cancer.  [name] presents to clinic today to discuss the possibility of a hereditary predisposition to cancer, to discuss genetic testing, and to further clarify her future cancer risks, as well as potential cancer risks for family members.   In April 2021, at the age of 53, [name] was diagnosed with ductal carcinoma in situ of the left breast. The treatment plan included breast conserving surgery, adjuvant radiation, and anti-estrogens.    CANCER HISTORY:  Oncology History  [name]  06/13/2019 Cancer Staging   Staging form: Breast, AJCC 8th Edition - Clinical stage from 06/13/2019: Stage 0 (cTis (DCIS), cN0, cM0, ER+, PR+)   06/15/2019 Initial Diagnosis   [name]      RISK FACTORS:  Menarche was at age 54.  Ovaries intact: yes.  Hysterectomy: no.  Menopausal status: postmenopausal.  HRT use: 0 years. Colonoscopy: yes;  January 2021 .  Past Medical History:  Diagnosis Date   Anemia    during pregnancy   Anxiety    Asthma    Bronchitis age 1   Cancer ([place])    left breast ca   COPD (chronic obstructive pulmonary disease) ([facility])    Depression    Hx of bladder infections    been over 5 years since last one    Rheumatoid arthritis ([place])    unc rheum    Tobacco abuse    Trouble in sleeping     Past Surgical History:  Procedure Laterality Date   BREAST LUMPECTOMY WITH RADIOACTIVE SEED AND SENTINEL LYMPH NODE BIOPSY  Left 08/30/2019   Procedure: LEFT BREAST LUMPECTOMY X 2 WITH RADIOACTIVE SEED AND SENTINEL LYMPH NODE BIOPSY;  Surgeon: [name], MD;  Location: [place];  Service: General;  Laterality: Left;  PEC BLOCK   COLONOSCOPY WITH PROPOFOL N/A 03/15/2019   Procedure: COLONOSCOPY WITH PROPOFOL;  Surgeon: Jonathon Bellows, MD;  Location: [facility];  Service: Gastroenterology;  Laterality: N/A;   HIP ARTHROPLASTY     JOINT REPLACEMENT     MULTIPLE TOOTH EXTRACTIONS     RE-EXCISION OF BREAST LUMPECTOMY Left 10/07/2019   Procedure: RE-EXCISION OF LEFT BREAST INFERIOR MARGIN;  Surgeon: [name], MD;  Location: [place];  Service: General;  Laterality: Left;   TOTAL HIP ARTHROPLASTY Left 02/15/2018   Procedure: TOTAL HIP ARTHROPLASTY ANTERIOR APPROACH;  Surgeon: [name], MD;  [facility];  Service: Orthopedics;  Laterality: Left;   WISDOM TOOTH EXTRACTION      Social History   Socioeconomic History   Marital status: Single    Spouse name: Not on file   Number of children: 1   Years of education: Not on file   Highest education level: Not on file  Occupational History   Not on file  Tobacco Use   Smoking status: Some Days    Packs/day: 0.25    Pack years: 0.00  Types: Cigarettes    Last attempt to quit: 09/08/2019    Years since quitting: 0.9   Smokeless tobacco: Never   Tobacco comments:    on nicotine patches, rarely smokes  Vaping Use   Vaping Use: Never used  Substance and Sexual Activity   Alcohol use: Yes    Comment: rarely    Drug use: Not Currently    Types: Cocaine    Comment: last used in 2004   Sexual activity: Not on file  Other Topics Concern   Not on file  Social History Narrative   Not on file   Social Determinants of Health   Financial Resource Strain: Not on file  Food Insecurity: Not on file  Transportation Needs: Not on file  Physical Activity: Not on file  Stress: Not on file  Social Connections: Not on file     FAMILY  HISTORY:  We obtained a detailed, 4-generation family history.  Significant diagnoses are listed below: Family History  Problem Relation Age of Onset   Breast cancer Mother 48       metastatic   Lung cancer Brother        maternal half brother; d. 9s   Lung cancer Maternal Uncle        d. 75s   Lung cancer Maternal Uncle        d. 57   Cancer Maternal Grandmother        unknown type; dx 16s   Breast cancer Maternal Great-grandmother        dx unknown age    [name] has one son, age 47, who does not have a history of cancer. She has a maternal half brother who died at age 28 and had a history of lung cancer.  [name] mother was diagnosed with breast cancer at age 26 and died at 8.  She has two maternal uncles who had a history of lung cancer.  [name] maternal grandmother had an unknown type of cancer diagnosed in her 42s.  She also reported a maternal great grandmother with a history of breast cancer diagnosed at an unknown age.  [name] father lived into his 53s and did not have a known history of lung cancer.  [name] had limited information about her paternal family.   [name] is unaware of previous family history of genetic testing for hereditary cancer risks.   GENETIC COUNSELING ASSESSMENT: [name] is a 60 y.o. female with a personal and family history of cancer which is somewhat suggestive of a hereditary cancer syndrome and predisposition to cancer given the presence of related cancers in the family. We, therefore, discussed and recommended the following at today's visit.   DISCUSSION: We discussed that 5 - 10% of cancer is hereditary, with most cases of hereditary breast cancer associated with mutations in BRCA1/2.  There are other genes that can be associated with hereditary breast cancer syndromes.  We discussed that testing is beneficial for several reasons including knowing how to follow individuals for their cancer risks and understanding if other family members  could be at risk for cancer and allowing them to undergo genetic testing.   We briefly reviewed the characteristics, features and inheritance patterns of hereditary cancer syndromes.  We recommended [name] pursue genetic testing for a panel that includes genes associated with breast cancer.  Furthermore, we reviewed the benefits of lung cancer screening given her family history of lung cancer and smoking history.   [name] declined  having a more detailed conversation about genetic testing at this testing given the stress she is experiencing in other aspects of her life.  She described herself as homeless and described that her current temporary living situation is about to end.  Social work team was contacted to provide support and informational resources to [name].   PLAN: [name] declined genetic testing today given the stress of her housing situation.  She opted to wait to have genetic testing at a later time when she feels less overwhelmed by her housing situation. We understand this decision and remain available to coordinate genetic testing at any time in the future. Contact information for social workers were provided. We recommend [name] continue to follow the cancer screening guidelines given by her oncology and primary healthcare providers.  Lastly, we encouraged [name] to remain in contact with cancer genetics so that we can continuously update the family history and inform her of any changes in cancer genetics and testing that may be of benefit for this family.   [name] questions were answered to her satisfaction today. Our contact information was provided should additional questions or concerns arise. Thank you for the referral and allowing [place] to share in the care of your patient.   Dimond Crotty M. Joette Catching, [place], [facility] Genetic Counselor [email or web address] (P) [phone]  The patient was seen for a total of 25 minutes in face-to-face genetic counseling. The patient was seen  alone.  Drs. Magrinat, Lindi Adie and/or Burr Medico were available to discuss this case as needed.    _______________________________________________________________________ For Office Staff:  Number of people involved in session: 1 Was an Intern/ student involved with case: no

## 2020-08-22 NOTE — Telephone Encounter (Signed)
Pt called to request one time dose of Valium before CT today. This request was given per Dr [name] to [facility] [place] verbal order with readback. This LPN called pt to make her aware; no answer LVM

## 2020-08-22 NOTE — Telephone Encounter (Signed)
Patient returned call to the office. 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 Patient states she is not currently having any pain or swelling at this time. Patient states she wants to have the prednisone to help when this does happen. Patient states she will only use it if absolutely necessary. Patient states she is taking Otrexup and Enbrel weekly as prescribed. Please advise.

## 2020-08-22 NOTE — Telephone Encounter (Signed)
I returned patient's call and discussed the side effects of prednisone.  I advised against the use of prednisone unless she develops increased joint swelling.  She may take Tylenol for pain management.  She will contact us in case she develops joint swelling.

## 2020-08-22 NOTE — Addendum Note (Signed)
Addended by: Ignacia Bayley on: 08/22/2020 01:20 PM   Modules accepted: Orders

## 2020-09-04 ENCOUNTER — Other Ambulatory Visit (HOSPITAL_COMMUNITY): Payer: Self-pay

## 2020-09-17 ENCOUNTER — Encounter (HOSPITAL_COMMUNITY): Payer: Self-pay

## 2020-09-24 ENCOUNTER — Other Ambulatory Visit (HOSPITAL_COMMUNITY): Payer: Self-pay

## 2020-10-03 ENCOUNTER — Other Ambulatory Visit (HOSPITAL_COMMUNITY): Payer: Self-pay

## 2020-10-03 ENCOUNTER — Other Ambulatory Visit: Payer: Self-pay | Admitting: Rheumatology

## 2020-10-03 MED ORDER — OTREXUP 20 MG/0.4ML ~~LOC~~ SOAJ
SUBCUTANEOUS | 0 refills | Status: DC
  Filled 2020-10-03: qty 1.6, 28d supply, fill #0
  Filled 2020-10-25: qty 1.6, 28d supply, fill #1

## 2020-10-03 NOTE — Telephone Encounter (Signed)
Next Visit: 10/31/2020  Last Visit: 07/30/2020  Last Fill: 05/23/2020  DX: Rheumatoid arthritis involving multiple sites with positive rheumatoid factor  Current Dose per office note 07/30/2020: Otrexup 20 mg subcutaneous injections once weekly  Labs: 07/30/2020 CBC is normal.  Glucose is mildly elevated probably not a fasting sample  Okay to refill Otrexup?

## 2020-10-05 ENCOUNTER — Other Ambulatory Visit (HOSPITAL_COMMUNITY): Payer: Self-pay

## 2020-10-05 ENCOUNTER — Ambulatory Visit: Payer: Medicare Other | Admitting: Internal Medicine

## 2020-10-10 ENCOUNTER — Telehealth: Payer: Self-pay | Admitting: Internal Medicine

## 2020-10-10 DIAGNOSIS — F17200 Nicotine dependence, unspecified, uncomplicated: Secondary | ICD-10-CM

## 2020-10-10 NOTE — Telephone Encounter (Signed)
Requested medications are due for refill today.  yes  Requested medications are on the active medications list.  yes  Last refill. 03/20/2020 & 08/09/2020  Future visit scheduled.   no  Notes to clinic.  Patient has not been seen in the office for over 1 year.

## 2020-10-10 NOTE — Telephone Encounter (Signed)
Medication Refill - Medication: albuterol (VENTOLIN HFA) 108 (90 Base) MCG/ACT inhaler   nicotine (NICODERM CQ - DOSED IN MG/24 HOURS) 14 mg/24hr patch  Has the patient contacted their pharmacy? No. (Agent: If no, request that the patient contact the pharmacy for the refill.) (Agent: If yes, when and what did the pharmacy advise?)  Preferred Pharmacy (with phone number or street name):  [facility] Phone:  [phone]  Fax:  [phone]     Agent: Please be advised that RX refills may take up to 3 business days. We ask that you follow-up with your pharmacy.

## 2020-10-12 NOTE — Telephone Encounter (Signed)
Pt called in again regarding these prescriptions, pt wanted to know if an emergency supply could be sent in to [facility] since she is out.

## 2020-10-15 ENCOUNTER — Other Ambulatory Visit: Payer: Self-pay | Admitting: Internal Medicine

## 2020-10-16 NOTE — Telephone Encounter (Signed)
Requested medication (s) are due for refill today: no  Requested medication (s) are on the active medication list: yes  Last refill:  07/23/2020  Future visit scheduled: No  Notes to clinic:  vm left for patient to contact office for appt Last appt was a no show    Requested Prescriptions  Pending Prescriptions Disp Refills   albuterol (VENTOLIN HFA) 108 (90 Base) MCG/ACT inhaler [Pharmacy Med Name: ALBUTEROL HFA (VENTOLIN) INH] 18 each 2    Sig: TAKE 2 PUFFS BY MOUTH EVERY 6 HOURS AS NEEDED FOR WHEEZE OR SHORTNESS OF BREATH      Pulmonology:  Beta Agonists Failed - 10/15/2020  4:19 PM      Failed - One inhaler should last at least one month. If the patient is requesting refills earlier, contact the patient to check for uncontrolled symptoms.      Failed - Valid encounter within last 12 months    Recent Outpatient Visits           1 year ago Moderate persistent reactive airway disease without complication   [place] Ladell Pier, MD   1 year ago Other bursal cyst, right elbow   [place], MD   2 years ago Arthritis of shoulder region, right   [place], MD   2 years ago Tobacco dependence   [place], MD   2 years ago Pap smear for cervical cancer screening   [place], Deborah B, MD       Future Appointments             In 2 weeks [name] [facility]

## 2020-10-18 NOTE — Progress Notes (Deleted)
Office Visit Note  Patient: [name]             Date of Birth: 1960-05-13           MRN: [id number]             PCP: Ladell Pier, MD Referring: Ladell Pier, MD Visit Date: 10/31/2020 Occupation: '@GUAROCC'$ @  Subjective:  No chief complaint on file.   History of Present Illness: [name] is a 60 y.o. female ***   Activities of Daily Living:  Patient reports morning stiffness for *** {minute/hour:19697}.   Patient {ACTIONS;DENIES/REPORTS:21021675::"Denies"} nocturnal pain.  Difficulty dressing/grooming: {ACTIONS;DENIES/REPORTS:21021675::"Denies"} Difficulty climbing stairs: {ACTIONS;DENIES/REPORTS:21021675::"Denies"} Difficulty getting out of chair: {ACTIONS;DENIES/REPORTS:21021675::"Denies"} Difficulty using hands for taps, buttons, cutlery, and/or writing: {ACTIONS;DENIES/REPORTS:21021675::"Denies"}  No Rheumatology ROS completed.   PMFS History:  Patient Active Problem List   Diagnosis Date Noted   Malignant neoplasm of upper-outer quadrant of left breast in female, estrogen receptor positive ([place]) 09/15/2019   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   Adenomatous colon polyp 03/16/2019   Degenerative joint disease of shoulder region 06/08/2018   Rheumatoid arthritis involving both hands with positive rheumatoid factor ([place]) 06/08/2018   Rheumatoid arthritis involving both feet with positive rheumatoid factor ([place]) 06/08/2018   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   Status post total hip replacement, 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   Smoker 06/08/2018   Anxiety and depression 06/08/2018   Family history of breast cancer 03/25/2018   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   Rheumatoid arthritis involving multiple sites ([place]) 01/15/2018   Tobacco dependence 01/15/2018   Opioid use agreement exists 01/15/2018   Positive depression screening 01/15/2018   Homeless 01/15/2018    Past Medical History:  Diagnosis Date   Anemia    during pregnancy   Anxiety     Asthma    Bronchitis age 59   Cancer ([place])    left breast ca   COPD (chronic obstructive pulmonary disease) ([place])    Depression    Hx of bladder infections    been over 5 years since last one    Rheumatoid arthritis ([place])    unc rheum    Tobacco abuse    Trouble in sleeping     Family History  Problem Relation Age of Onset   Breast cancer Mother 49       metastatic   Cervical cancer Mother    Lung cancer Brother        maternal half brother; d. 57s   Lung cancer Maternal Uncle        d. 24s   Lung cancer Maternal Uncle        d. 79   Cancer Maternal Grandmother        unknown type; dx 63s   Healthy Son    Breast cancer Maternal Great-grandmother        dx unknown age   Past Surgical History:  Procedure Laterality Date   BREAST LUMPECTOMY WITH RADIOACTIVE SEED AND SENTINEL LYMPH NODE BIOPSY Left 08/30/2019   Procedure: LEFT BREAST LUMPECTOMY X 2 WITH RADIOACTIVE SEED AND SENTINEL LYMPH NODE BIOPSY;  Surgeon: [name], MD;  Location: [facility];  Service: General;  Laterality: Left;  PEC BLOCK   COLONOSCOPY WITH PROPOFOL N/A 03/15/2019   Procedure: COLONOSCOPY WITH PROPOFOL;  Surgeon: Jonathon Bellows, MD;  Location: [facility]COPY;  Service: Gastroenterology;  Laterality: N/A;   HIP ARTHROPLASTY     JOINT REPLACEMENT     MULTIPLE TOOTH EXTRACTIONS  RE-EXCISION OF BREAST LUMPECTOMY Left 10/07/2019   Procedure: RE-EXCISION OF LEFT BREAST INFERIOR MARGIN;  Surgeon: [name], MD;  Location: [facility];  Service: General;  Laterality: Left;   TOTAL HIP ARTHROPLASTY Left 02/15/2018   Procedure: TOTAL HIP ARTHROPLASTY ANTERIOR APPROACH;  Surgeon: [name], MD;  [facility];  Service: Orthopedics;  Laterality: Left;   WISDOM TOOTH EXTRACTION     Social History   Social History Narrative   Not on file   Immunization History  Administered Date(s) Administered   PFIZER(Purple Top)SARS-COV-2 Vaccination 06/27/2019, 07/19/2019   Pneumococcal  Conjugate-13 07/14/2018   Tdap 03/25/2018   Zoster Recombinat (Shingrix) 07/14/2018     Objective: Vital Signs: There were no vitals taken for this visit.   Physical Exam   Musculoskeletal Exam: ***  CDAI Exam: CDAI Score: -- Patient Global: --; Provider Global: -- Swollen: --; Tender: -- Joint Exam 10/31/2020   No joint exam has been documented for this visit   There is currently no information documented on the homunculus. Go to the Rheumatology activity and complete the homunculus joint exam.  Investigation: No additional findings.  Imaging: No results found.  Recent Labs: Lab Results  Component Value Date   WBC 7.5 07/30/2020   HGB 14.4 07/30/2020   PLT 228 07/30/2020   NA 140 07/30/2020   K 4.3 07/30/2020   CL 99 07/30/2020   CO2 33 (H) 07/30/2020   GLUCOSE 125 (H) 07/30/2020   BUN 13 07/30/2020   CREATININE 0.74 07/30/2020   BILITOT 0.5 07/30/2020   ALKPHOS 92 11/25/2019   AST 14 07/30/2020   ALT 13 07/30/2020   PROT 6.8 07/30/2020   ALBUMIN 3.7 11/25/2019   CALCIUM 9.6 07/30/2020   GFRAA 101 07/30/2020   QFTBGOLDPLUS NEGATIVE 07/30/2020    Speciality Comments: No specialty comments available.  Procedures:  No procedures performed Allergies: Other   Assessment / Plan:     Visit Diagnoses: No diagnosis found.  Orders: No orders of the defined types were placed in this encounter.  No orders of the defined types were placed in this encounter.   Face-to-face time spent with patient was *** minutes. Greater than 50% of time was spent in counseling and coordination of care.  Follow-Up Instructions: No follow-ups on file.   Earnestine Mealing, CMA  Note - This record has been created using Editor, commissioning.  Chart creation errors have been sought, but may not always  have been located. Such creation errors do not reflect on  the standard of medical care.

## 2020-10-19 MED ORDER — ALBUTEROL SULFATE HFA 108 (90 BASE) MCG/ACT IN AERS: INHALATION_SPRAY | RESPIRATORY_TRACT | 1 refills | Status: DC

## 2020-10-19 NOTE — Addendum Note (Signed)
Addended by: Daisy Blossom, Annie Main L on: 10/19/2020 04:42 PM   Modules accepted: Orders

## 2020-10-19 NOTE — Telephone Encounter (Signed)
Patient has not been seen since 03/2019. Refill will have to be authorized by PCP if appropriate. I have pended an order and will forward request to her for review.

## 2020-10-19 NOTE — Telephone Encounter (Signed)
Pt called for an update on the Rx refill request. Pt has an appt scheduled for 12/18/20 and would like to know if the Rx refill will be approved or are are any samples that she can get. Pt requests call back

## 2020-10-23 ENCOUNTER — Other Ambulatory Visit: Payer: Self-pay | Admitting: *Deleted

## 2020-10-23 DIAGNOSIS — C50412 Malignant neoplasm of upper-outer quadrant of left female breast: Secondary | ICD-10-CM

## 2020-10-24 ENCOUNTER — Other Ambulatory Visit: Payer: Self-pay

## 2020-10-24 ENCOUNTER — Inpatient Hospital Stay: Payer: Medicare Other | Attending: Adult Health

## 2020-10-24 DIAGNOSIS — Z170 Estrogen receptor positive status [ER+]: Secondary | ICD-10-CM | POA: Insufficient documentation

## 2020-10-24 DIAGNOSIS — C50412 Malignant neoplasm of upper-outer quadrant of left female breast: Secondary | ICD-10-CM | POA: Diagnosis present

## 2020-10-24 LAB — CBC WITH DIFFERENTIAL (CANCER CENTER ONLY)
Abs Immature Granulocytes: 0.01 10*3/uL (ref 0.00–0.07)
Basophils Absolute: 0 10*3/uL (ref 0.0–0.1)
Basophils Relative: 1 %
Eosinophils Absolute: 0.1 10*3/uL (ref 0.0–0.5)
Eosinophils Relative: 3 %
HCT: 43.1 % (ref 36.0–46.0)
Hemoglobin: 14.5 g/dL (ref 12.0–15.0)
Immature Granulocytes: 0 %
Lymphocytes Relative: 32 %
Lymphs Abs: 1.5 10*3/uL (ref 0.7–4.0)
MCH: 30.8 pg (ref 26.0–34.0)
MCHC: 33.6 g/dL (ref 30.0–36.0)
MCV: 91.5 fL (ref 80.0–100.0)
Monocytes Absolute: 0.4 10*3/uL (ref 0.1–1.0)
Monocytes Relative: 8 %
Neutro Abs: 2.6 10*3/uL (ref 1.7–7.7)
Neutrophils Relative %: 56 %
Platelet Count: 167 10*3/uL (ref 150–400)
RBC: 4.71 MIL/uL (ref 3.87–5.11)
RDW: 13.8 % (ref 11.5–15.5)
WBC Count: 4.7 10*3/uL (ref 4.0–10.5)
nRBC: 0 % (ref 0.0–0.2)

## 2020-10-24 LAB — CMP (CANCER CENTER ONLY)
ALT: 19 U/L (ref 0–44)
AST: 18 U/L (ref 15–41)
Albumin: 4.1 g/dL (ref 3.5–5.0)
Alkaline Phosphatase: 78 U/L (ref 38–126)
Anion gap: 10 (ref 5–15)
BUN: 16 mg/dL (ref 8–23)
CO2: 28 mmol/L (ref 22–32)
Calcium: 9.5 mg/dL (ref 8.9–10.3)
Chloride: 105 mmol/L (ref 98–111)
Creatinine: 0.84 mg/dL (ref 0.44–1.00)
GFR, Estimated: >60 mL/min (ref >60)
Glucose, Bld: 76 mg/dL (ref 70–99)
Potassium: 4.5 mmol/L (ref 3.5–5.1)
Sodium: 143 mmol/L (ref 135–145)
Total Bilirubin: 0.8 mg/dL (ref 0.3–1.2)
Total Protein: 7 g/dL (ref 6.5–8.1)

## 2020-10-25 ENCOUNTER — Other Ambulatory Visit: Payer: Self-pay | Admitting: Physician Assistant

## 2020-10-25 ENCOUNTER — Other Ambulatory Visit (HOSPITAL_COMMUNITY): Payer: Self-pay

## 2020-10-25 MED ORDER — ENBREL MINI 50 MG/ML ~~LOC~~ SOCT
50.0000 mg | SUBCUTANEOUS | 0 refills | Status: DC
  Filled 2020-10-25: qty 4, 28d supply, fill #0

## 2020-10-25 NOTE — Telephone Encounter (Signed)
Next Visit: 10/31/2020   Last Visit: 07/30/2020   Last Fill: 07/02/2020   DX: Rheumatoid arthritis involving multiple sites with positive rheumatoid factor   Current Dose per office note 07/30/2020: Rheumatoid arthritis involving multiple sites with positive rheumatoid factor   Labs: 07/30/2020 CBC is normal.  Glucose is mildly elevated probably not a fasting sample  TB Gold: 07/29/2020 Neg   Noted appointment note on 10/31/2020 to update labs.   Okay to refill Enbrel?

## 2020-10-26 ENCOUNTER — Other Ambulatory Visit (HOSPITAL_COMMUNITY): Payer: Self-pay

## 2020-10-30 ENCOUNTER — Other Ambulatory Visit (HOSPITAL_COMMUNITY): Payer: Self-pay

## 2020-10-31 ENCOUNTER — Other Ambulatory Visit (HOSPITAL_COMMUNITY): Payer: Self-pay

## 2020-10-31 ENCOUNTER — Ambulatory Visit (HOSPITAL_COMMUNITY): Payer: Medicare Other

## 2020-10-31 ENCOUNTER — Ambulatory Visit: Payer: Medicare Other | Admitting: Physician Assistant

## 2020-10-31 DIAGNOSIS — Z803 Family history of malignant neoplasm of breast: Secondary | ICD-10-CM

## 2020-10-31 DIAGNOSIS — M19211 Secondary osteoarthritis, right shoulder: Secondary | ICD-10-CM

## 2020-10-31 DIAGNOSIS — F32A Depression, unspecified: Secondary | ICD-10-CM

## 2020-10-31 DIAGNOSIS — Z79899 Other long term (current) drug therapy: Secondary | ICD-10-CM

## 2020-10-31 DIAGNOSIS — F17200 Nicotine dependence, unspecified, uncomplicated: Secondary | ICD-10-CM

## 2020-10-31 DIAGNOSIS — M0579 Rheumatoid arthritis with rheumatoid factor of multiple sites without organ or systems involvement: Secondary | ICD-10-CM

## 2020-10-31 DIAGNOSIS — Z96642 Presence of left artificial hip joint: Secondary | ICD-10-CM

## 2020-10-31 DIAGNOSIS — D0512 Intraductal carcinoma in situ of left breast: Secondary | ICD-10-CM

## 2020-10-31 NOTE — Progress Notes (Addendum)
[facility]  Telephone:([phone]) [phone] Fax:([phone]) [phone]     ID: [name] DOB: 1960/09/20  MR#: [id number]  [id number]  Patient Care Team: Ladell Pier, MD as PCP - General (Internal Medicine) Jalasia [facility], Haley Dad, MD as Consulting Physician (Oncology) [name], MD as Consulting Physician (General Surgery) Bo Merino, MD as Consulting Physician (Rheumatology) Beverely Pace, LCSW as Social Worker ([place]) Aurea Graff OTHER MD:  CHIEF COMPLAINT: estrogen receptor positive breast cancer  CURRENT TREATMENT: tamoxifen   INTERVAL HISTORY: [name] [name] scheduled today for follow up of her invasive breast cancer, however she did not show..    She [name] started on tamoxifen on 01/30/2020.  She had a virtual survivorship visit 08/02/2020 reporting good tolerance.  Since her last visit, she underwent genetic counseling on 08/22/2020. She declined to proceed with testing at that time due to stress with her housing situation.  At that visit also she reported that she [name] going to be homeless as she could no longer stay where she had been living and her family could not take her.  She [name] given information on shelters by social work [name])   She is scheduled for annual mammogram on 11/21/2020.  Also I received this email from scheduling: "[name],[name], dob 1960-05-20 [name] scheduled for ct chest lung ca screen low/dose wo cm for today 10/31/20. no show and not rescheduled at this time."   REVIEW OF SYSTEMS: [name]    COVID 19 VACCINATION STATUS: [name] x2   HISTORY OF CURRENT ILLNESS: From the original intake note:  [name] had routine screening mammography on 05/10/2019 showing a possible abnormality in the left breast. She underwent left diagnostic mammography with tomography at [facility] on 05/27/2019 showing: breast density category C; indeterminate 0.9 cm left breast calcifications.  There [name] no axillary  ultrasonography.  Accordingly on 06/13/2019 she proceeded to biopsy of the left breast area in question. The pathology from this procedure ([id number]) showed: ductal carcinoma in situ, intermediate grade. Prognostic indicators significant for: estrogen receptor, 100% positive and progesterone receptor, 60% positive, both with strong staining intensity.   She underwent breast MRI on 06/24/2019 showing: breast composition D; suspicious 8 mm enhancing mass located approximately 2.5 cm superior and medial to biopsy site; no evidence of right breast malignancy; no suspicious lymphadenopathy within limits of the study.  MRI biopsy of the enhancing mass in the upper central left breast [name] performed today, with results pending.  The patient's subsequent history is as detailed below.   PAST MEDICAL HISTORY: Past Medical History:  Diagnosis Date   Anemia    during pregnancy   Anxiety    Asthma    Bronchitis age 77   Cancer ([place])    left breast ca   COPD (chronic obstructive pulmonary disease) (H[facility])    Depression    Hx of bladder infections    been over 5 years since last one    Rheumatoid arthritis ([place])    unc rheum    Tobacco abuse    Trouble in sleeping     PAST SURGICAL HISTORY: Past Surgical History:  Procedure Laterality Date   BREAST LUMPECTOMY WITH RADIOACTIVE SEED AND SENTINEL LYMPH NODE BIOPSY Left 08/30/2019   Procedure: LEFT BREAST LUMPECTOMY X 2 WITH RADIOACTIVE SEED AND SENTINEL LYMPH NODE BIOPSY;  Surgeon: [name], MD;  Location: [facility];  Service: General;  Laterality: Left;  PEC BLOCK   COLONOSCOPY WITH PROPOFOL N/A 03/15/2019   Procedure: COLONOSCOPY  WITH PROPOFOL;  Surgeon: Jonathon Bellows, MD;  Location: [facility];  Service: Gastroenterology;  Laterality: N/A;   HIP ARTHROPLASTY     JOINT REPLACEMENT     MULTIPLE TOOTH EXTRACTIONS     RE-EXCISION OF BREAST LUMPECTOMY Left 10/07/2019   Procedure: RE-EXCISION OF LEFT BREAST INFERIOR MARGIN;  Surgeon: [name],  MD;  Location: [facility];  Service: General;  Laterality: Left;   TOTAL HIP ARTHROPLASTY Left 02/15/2018   Procedure: TOTAL HIP ARTHROPLASTY ANTERIOR APPROACH;  Surgeon: [name], MD;  [facility];  Service: Orthopedics;  Laterality: Left;   WISDOM TOOTH EXTRACTION      FAMILY HISTORY: Family History  Problem Relation Age of Onset   Breast cancer Mother 53       metastatic   Cervical cancer Mother    Lung cancer Brother        maternal half brother; d. 69s   Lung cancer Maternal Uncle        d. 28s   Lung cancer Maternal Uncle        d. 52   Cancer Maternal Grandmother        unknown type; dx 60s   Healthy Son    Breast cancer Maternal Great-grandmother        dx unknown age  The patient's father died in his 61s.  The patient tells me she essentially has no information on him or his side of the family.  The patient's mother [name] diagnosed with breast cancer age 50 and died age 70.  She also had a history of uterine cancer.  The patient's mother's mother died at age 65 and developed cancer at age 55.  She does not know what kind of cancer this [name].  The patient's mother had 5 sisters and 5 brothers.  1 brother had lung cancer and died at age 71 from it, in the setting of tobacco abuse.  The patient tells me she has 24 Cousins and that the son of one of her cousins has died from cancer but she does not know the type. ADDENDUM: The patient tells me she has found out that her great aunt and and and on her mother side both had breast cancer.   GYNECOLOGIC HISTORY:  No LMP recorded. Patient is postmenopausal. Menarche: 60 years old Age at first live birth: 60 years old [place] P 1 LMP 49 Contraceptive intermittently, with no complications HRT no  Hysterectomy?  No BSO?  No   SOCIAL HISTORY: (updated 06/2019)  [name] describes herself as homeless.  She is currently staying with friends.  She is on disability secondary to her hip problems.  Her son [name] works in  Biomedical scientist and is also a Furniture conservator/restorer.  The patient has 2 step grandchildren aged 38 and 62, the 35 year old being special needs.  The patient describes herself as spiritual and nondenominational   ADVANCED DIRECTIVES: At the 11/25/2019 visit the patient [name] given the appropriate documents to complete and notarized at her discretion   HEALTH MAINTENANCE: Social History   Tobacco Use   Smoking status: Some Days    Packs/day: 0.25    Types: Cigarettes    Last attempt to quit: 09/08/2019    Years since quitting: 1.1   Smokeless tobacco: Never   Tobacco comments:    on nicotine patches, rarely smokes  Vaping Use   Vaping Use: Never used  Substance Use Topics   Alcohol use: Yes    Comment: rarely    Drug use:  Not Currently    Types: Cocaine    Comment: last used in 2004     Colonoscopy: 03/2019 (Dr. Vicente Males), repeat 2024  PAP: 03/2018, negative  Bone density: none on file   Allergies  Allergen Reactions   Other Rash    Unknown cleaner used in OR    Current Outpatient Medications  Medication Sig Dispense Refill   albuterol (VENTOLIN HFA) 108 (90 Base) MCG/ACT inhaler TAKE 2 PUFFS BY MOUTH EVERY 6 HOURS AS NEEDED FOR WHEEZE OR SHORTNESS OF BREATH 18 each 1   Cholecalciferol (VITAMIN D3) 75 MCG (3000 UT) TABS Take 3,000 Units by mouth daily.     cyanocobalamin 100 MCG tablet Take 100 mcg by mouth daily.     diazepam (VALIUM) 2 MG tablet Take 1/2 tablet (1 mg total) by mouth before procedure. 1 tablet 0   Etanercept (ENBREL MINI) 50 MG/ML SOCT INJECT 50 MG INTO THE SKIN ONCE A WEEK. 4 mL 0   folic acid (FOLVITE) 1 MG tablet TAKE 2 TABLETS BY MOUTH ONCE DAILY 180 tablet 3   Methotrexate, PF, (OTREXUP) 20 MG/0.4ML SOAJ INJECT 20 MG INTO THE SKIN ONCE A WEEK. 4.8 mL 0   Multiple Vitamin (MULTIVITAMIN WITH MINERALS) TABS tablet Take 1 tablet by mouth daily.     nicotine (NICODERM CQ - DOSED IN MG/24 HOURS) 14 mg/24hr patch PLACE 1 PATCH (14 MG TOTAL) ONTO THE SKIN DAILY. 28 patch 1    OTREXUP 20 MG/0.4ML SOAJ INJECT 20 MG INTO THE SKIN ONCE A WEEK. 4.8 mL 0   SYMBICORT 80-4.5 MCG/ACT inhaler TAKE 2 PUFFS BY MOUTH TWICE A DAY 30.6 each 0   tamoxifen (NOLVADEX) 20 MG tablet TAKE 1 TABLET BY MOUTH ONCE A DAY 90 tablet 3   No current facility-administered medications for this visit.    OBJECTIVE:   There were no vitals filed for this visit.    There is no height or weight on file to calculate BMI.   Wt Readings from Last 3 Encounters:  07/30/20 103 lb 6.4 oz (46.9 kg)  02/21/20 103 lb 9.6 oz (47 kg)  01/30/20 98 lb 14.4 oz (44.9 kg)  ECOG1       LAB RESULTS:  CMP     Component Value Date/Time   NA 143 10/24/2020 1210   NA 142 01/15/2018 1509   K 4.5 10/24/2020 1210   CL 105 10/24/2020 1210   CO2 28 10/24/2020 1210   GLUCOSE 76 10/24/2020 1210   BUN 16 10/24/2020 1210   BUN 16 01/15/2018 1509   CREATININE 0.84 10/24/2020 1210   CREATININE 0.74 07/30/2020 1310   CALCIUM 9.5 10/24/2020 1210   PROT 7.0 10/24/2020 1210   PROT 6.8 01/15/2018 1509   ALBUMIN 4.1 10/24/2020 1210   ALBUMIN 4.3 01/15/2018 1509   AST 18 10/24/2020 1210   ALT 19 10/24/2020 1210   ALKPHOS 78 10/24/2020 1210   BILITOT 0.8 10/24/2020 1210   GFRNONAA >60 10/24/2020 1210   GFRNONAA 87 07/30/2020 1310   GFRAA 101 07/30/2020 1310    Lab Results  Component Value Date   ALBUMINELP 4.1 05/11/2018   A1GS 0.4 (H) 05/11/2018   A2GS 0.8 05/11/2018   BETS 0.5 05/11/2018   BETA2SER 0.4 05/11/2018   GAMS 0.8 05/11/2018   SPEI  05/11/2018     Comment:     . A poorly-defined band of restricted protein mobility is detected in the gamma globulins. It is unlikely that this may represent a monoclonal protein; however, immunofixation  analysis is available if clinically indicated. .     Lab Results  Component Value Date   WBC 4.7 10/24/2020   NEUTROABS 2.6 10/24/2020   HGB 14.5 10/24/2020   HCT 43.1 10/24/2020   MCV 91.5 10/24/2020   PLT 167 10/24/2020    No results found  for: LABCA2  No components found for: [id number]  No results for input(s): INR in the last 168 hours.  No results found for: LABCA2  No results found for: [id number]  No results found for: [id number]  No results found for: [id number]  No results found for: CA2729  No components found for: HGQUANT  No results found for: CEA1 / No results found for: CEA1   No results found for: AFPTUMOR  No results found for: CHROMOGRNA  No results found for: KPAFRELGTCHN, LAMBDASER, KAPLAMBRATIO (kappa/lambda light chains)  No results found for: HGBA, HGBA2QUANT, HGBFQUANT, HGBSQUAN (Hemoglobinopathy evaluation)   No results found for: LDH  No results found for: IRON, TIBC, IRONPCTSAT (Iron and TIBC)  No results found for: FERRITIN  Urinalysis    Component Value Date/Time   COLORURINE YELLOW 05/11/2018 0957   APPEARANCEUR CLEAR 05/11/2018 0957   LABSPEC 1.015 05/11/2018 0957   PHURINE < OR = 5.0 05/11/2018 0957   GLUCOSEU NEGATIVE 05/11/2018 0957   HGBUR NEGATIVE 05/11/2018 0957   BILIRUBINUR NEGATIVE 02/08/2018 1545   [place] 05/11/2018 0957   PROTEINUR NEGATIVE 05/11/2018 0957   NITRITE NEGATIVE 05/11/2018 0957   LEUKOCYTESUR NEGATIVE 05/11/2018 0957    STUDIES: No results found.   ELIGIBLE FOR AVAILABLE RESEARCH PROTOCOL: Not a COMET candidate given presence of a mass  ASSESSMENT: 60 y.o. [name], [place] woman status post left breast biopsy 06/13/2019 for ductal carcinoma in situ, grade 2, estrogen and progesterone receptor positive.  (a) MRI biopsy of a 0.8 cm left breast mass 07/07/2019, results pending  (1) status post left lumpectomy and sentinel lymph node sampling 08/30/2019 for a pT1c pN0, stage IA invasive ductal carcinoma, grade 1, with DCIS focally involving the inferior margin  (a) a total of 4 left axillary lymph nodes were removed.   (b) inferior margin successfully cleared with additional surgery 10/07/2019  (2) Oncotype score of 15 predicts a risk of  breast cancer recurrence outside the breast in the next 9 years of 4% if the patient's only systemic therapy is antiestrogens.  It predicts no benefit from chemotherapy.  (3) adjuvant radiation, completed 10/20 treatments  (4) antiestrogens started 01/30/2020 with Tamoxifen  (5) genetics testing: Declined (see discussion 08/22/2020/ [name])   PLAN: [name] did not show for her 11/01/2020 visit.  I checked with our social worker and she has tried to contact the patient but there has been no answer and she has left messages which have not been returned  Follow-up letter has been sent.   Haley Dad. Marilyne Haseley, MD 10/31/20 10:47 PM Medical Oncology and Hematology [facility] [place], [place] [address] Tel. [phone]    Fax. [phone]  Addendum: Subsequent note from our social worker: Received return call from patient. She reports that she is staying in her previous housing until 11/08/2020 and will then be living in a camper on her son's property until she is able to get off of a waitlist for housing in [place]. She is happy with this arrangement as [place] [facility], she has her own space but is close to family, and feels more stable.     Patient  reports that she did not realize she had an appointment this morning with Dr. [name]. She plans to reschedule and continue follow-up care here at [facility].     Haley Douglas, LCSW  I, [name], am acting as scribe for Dr. Virgie Dad. [name].  I, Haley Del MD, have reviewed the above documentation for accuracy and completeness, and I agree with the above.    *Total Encounter Time as defined by the Centers for Medicare and Medicaid Services includes, in addition to the face-to-face time of a patient visit (documented in the note above) non-face-to-face time: obtaining and reviewing outside history, ordering and reviewing medications, tests or procedures, care  coordination (communications with other health care professionals or caregivers) and documentation in the medical record.

## 2020-10-31 NOTE — Progress Notes (Signed)
Office Visit Note  Patient: [name]             Date of Birth: 11/15/1960           MRN: [id number]             PCP: Ladell Pier, MD Referring: Ladell Pier, MD Visit Date: 11/14/2020 Occupation: '@GUAROCC'$ @  Subjective:  Medication monitoring   History of Present Illness: [name] is a 60 y.o. female with history of seropositive rheumatoid arthritis and osteoarthritis.  Patient is currently on Enbrel 50 mg subcu days injections once weekly, Otrexup 20 mg apiece injections once weekly, folic acid 2 mg by mouth daily.  Her most recent injections were on Monday.  She denies missing any doses recently.  She states that she has had some increased pain in the left shoulder as well as both hands and is unsure if it is due to weather changes or increased stress.  She is in the process of moving and is excited because she will be closer to the [facility] gym.  She would like to start going to water aerobics more frequently since it has helped with her range of motion and discomfort in the past.   Activities of Daily Living:  Patient reports morning stiffness for all day.  Patient Reports nocturnal pain.  Difficulty dressing/grooming: Reports Difficulty climbing stairs: Denies Difficulty getting out of chair: Denies Difficulty using hands for taps, buttons, cutlery, and/or writing: Reports  Review of Systems  Constitutional:  Positive for fatigue.  HENT:  Positive for mouth dryness. Negative for mouth sores and nose dryness.   Eyes:  Negative for pain, itching, visual disturbance and dryness.  Respiratory:  Positive for shortness of breath. Negative for cough and hemoptysis.   Cardiovascular:  Negative for chest pain, palpitations, hypertension and swelling in legs/feet.  Gastrointestinal:  Positive for constipation and diarrhea. Negative for blood in stool.  Endocrine: Negative for increased urination.  Genitourinary:  Negative for difficulty urinating and painful urination.   Musculoskeletal:  Positive for joint pain, joint pain and morning stiffness. Negative for joint swelling, myalgias, muscle weakness, muscle tenderness and myalgias.  Skin:  Negative for color change, pallor, rash, hair loss, nodules/bumps, redness, skin tightness, ulcers and sensitivity to sunlight.  Allergic/Immunologic: Negative for susceptible to infections.  Neurological:  Positive for memory loss. Negative for dizziness, numbness, headaches and weakness.  Hematological:  Positive for swollen glands.  Psychiatric/Behavioral:  Negative for depressed mood and sleep disturbance. The patient is not nervous/anxious.    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   Adenomatous colon polyp 03/16/2019   Degenerative joint disease of shoulder region 06/08/2018   Rheumatoid arthritis involving both hands with positive rheumatoid factor ([place]) 06/08/2018   Rheumatoid arthritis involving both feet with positive rheumatoid factor ([place]) 06/08/2018   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   Status post total hip replacement, 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   Smoker 06/08/2018   Anxiety and depression 06/08/2018   Family history of breast cancer 03/25/2018   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   Rheumatoid arthritis involving multiple sites ([place]) 01/15/2018   Tobacco dependence 01/15/2018   Opioid use agreement exists 01/15/2018   Positive depression screening 01/15/2018   Homeless 01/15/2018    Past Medical History:  Diagnosis Date   Anemia  during pregnancy   Anxiety    Asthma    Bronchitis age 75   Cancer ([place])    left breast ca   COPD (chronic obstructive pulmonary disease) (HCC)    Depression    Hx of bladder infections    been over 5 years since last one    Rheumatoid arthritis ([place])    unc rheum     Tobacco abuse    Trouble in sleeping     Family History  Problem Relation Age of Onset   Breast cancer Mother 16       metastatic   Cervical cancer Mother    Lung cancer Brother        maternal half brother; d. 5s   Lung cancer Maternal Uncle        d. 45s   Lung cancer Maternal Uncle        d. 9   Cancer Maternal Grandmother        unknown type; dx 72s   Healthy Son    Breast cancer Maternal Great-grandmother        dx unknown age   Past Surgical History:  Procedure Laterality Date   BREAST LUMPECTOMY WITH RADIOACTIVE SEED AND SENTINEL LYMPH NODE BIOPSY Left 08/30/2019   Procedure: LEFT BREAST LUMPECTOMY X 2 WITH RADIOACTIVE SEED AND SENTINEL LYMPH NODE BIOPSY;  Surgeon: [name], MD;  Location: [place];  Service: General;  Laterality: Left;  PEC BLOCK   COLONOSCOPY WITH PROPOFOL N/A 03/15/2019   Procedure: COLONOSCOPY WITH PROPOFOL;  Surgeon: Jonathon Bellows, MD;  Location[facility];  Service: Gastroenterology;  Laterality: N/A;   HIP ARTHROPLASTY     JOINT REPLACEMENT     MULTIPLE TOOTH EXTRACTIONS     RE-EXCISION OF BREAST LUMPECTOMY Left 10/07/2019   Procedure: RE-EXCISION OF LEFT BREAST INFERIOR MARGIN;  Surgeon: [name], MD;  Location: [place];  Service: General;  Laterality: Left;   TOTAL HIP ARTHROPLASTY Left 02/15/2018   Procedure: TOTAL HIP ARTHROPLASTY ANTERIOR APPROACH;  Surgeon: [name], MD;  [facility];  Service: Orthopedics;  Laterality: Left;   WISDOM TOOTH EXTRACTION     Social History   Social History Narrative   Not on file   Immunization History  Administered Date(s) Administered   PFIZER(Purple Top)SARS-COV-2 Vaccination 06/27/2019, 07/19/2019   Pneumococcal Conjugate-13 07/14/2018   Tdap 03/25/2018   Zoster Recombinat (Shingrix) 07/14/2018     Objective: Vital Signs: BP 105/69 (BP Location: Left Arm, Patient Position: Sitting, Cuff Size: Normal)   Pulse 80   Ht 5' 5.5" (1.664 m)   Wt 107 lb 9.6 oz (48.8  kg)   BMI 17.63 kg/m    Physical Exam Vitals and nursing note reviewed.  Constitutional:      Appearance: She is well-developed.  HENT:     Head: Normocephalic and atraumatic.  Eyes:     Conjunctiva/sclera: Conjunctivae normal.  Pulmonary:     Effort: Pulmonary effort is normal.  Abdominal:     Palpations: Abdomen is soft.  Musculoskeletal:     Cervical back: Normal range of motion.  Skin:    General: Skin is warm and dry.     Capillary Refill: Capillary refill takes less than 2 seconds.  Neurological:     Mental Status: She is alert and oriented to person, place, and time.  Psychiatric:        Behavior: Behavior normal.     Musculoskeletal Exam: C-spine is limited range of motion with lateral rotation.  Painful range of motion of both shoulder joints with crepitus.  Elbow joints have good range of motion with no tenderness or inflammation.  Rheumatoid nodule on the extensor surface of the right elbow noted.  Wrist joints have good range of motion with no tenderness or synovitis.  Synovial thickening over MCP joints but no tenderness or synovitis was noted.  PIP and DIP thickening consistent with osteoarthritis of both hands noted.  Right hip has good range of motion with no discomfort.  Left hip replacement has good range of motion with no discomfort.  Knee joints have good range of motion with no warmth or effusion.  Ankle joints have good range of motion with no tenderness or joint swelling.  CDAI Exam: CDAI Score: 0.4  Patient Global: 2 mm; Provider Global: 2 mm Swollen: 0 ; Tender: 0  Joint Exam 11/14/2020   No joint exam has been documented for this visit   There is currently no information documented on the homunculus. Go to the Rheumatology activity and complete the homunculus joint exam.  Investigation: No additional findings.  Imaging: No results found.  Recent Labs: Lab Results  Component Value Date   WBC 4.7 10/24/2020   HGB 14.5 10/24/2020   PLT 167  10/24/2020   NA 143 10/24/2020   K 4.5 10/24/2020   CL 105 10/24/2020   CO2 28 10/24/2020   GLUCOSE 76 10/24/2020   BUN 16 10/24/2020   CREATININE 0.84 10/24/2020   BILITOT 0.8 10/24/2020   ALKPHOS 78 10/24/2020   AST 18 10/24/2020   ALT 19 10/24/2020   PROT 7.0 10/24/2020   ALBUMIN 4.1 10/24/2020   CALCIUM 9.5 10/24/2020   GFRAA 101 07/30/2020   QFTBGOLDPLUS NEGATIVE 07/30/2020    Speciality Comments: No specialty comments available.  Procedures:  No procedures performed Allergies: Other   Assessment / Plan:     Visit Diagnoses: Rheumatoid arthritis involving multiple sites with positive rheumatoid factor ([place]): She has no synovitis on examination today.  She is clinically been doing well on Enbrel 50 mg subcutaneous injections once weekly, Otrexup 20 mg subcu injections once weekly, and folic acid 2 mg daily.  She experiences intermittent pain in both shoulder joints and both hands but has no inflammation on examination today.  She is currently in the process of moving and has been under increased stress which may be contributing to some of her increased arthralgias.  She plans on starting to go to water aerobics more frequently to try to improve her joint stiffness and arthralgias.  Overall her rheumatoid arthritis seems well controlled on the current treatment regimen.  No medication changes will be made at this time.  She was advised to notify us if she develops increased joint pain or joint swelling.  She will follow-up in the office in 5 months.  High risk medication use - Enbrel 50 mg subcutaneous injections once weekly (started on 05/10/2019), Otrexup 20 mg subcutaneous injections once weekly, and folic acid 2 mg by mouth daily.  CBC and CMP were within normal limits on 10/24/2020.  Her next lab work will be due in November and every 3 months to monitor for drug toxicity.  Standing orders for CBC and CMP remain in place.  TB Gold negative on 07/30/2020 and will continue to be  monitored yearly. Discussed the importance of holding Enbrel and Otrexup if she develops signs or symptoms of an infection and to resume once the infection has completely cleared.  She voiced understanding.  Other secondary osteoarthritis  of both shoulders: Chronic pain and stiffness.  Status post total hip replacement, left: Doing well.  She has good range of motion with no discomfort at this time.  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 Underwent lumpectomy 08/30/19 and radiation therapy-completed 12/28/19.  Anxiety and depression  Family history of breast cancer  Orders: No orders of the defined types were placed in this encounter.  No orders of the defined types were placed in this encounter.    Follow-Up Instructions: Return in about 5 months (around 04/16/2021) for Rheumatoid arthritis.   Ofilia Neas, PA-C  Note - This record has been created using Dragon software.  Chart creation errors have been sought, but may not always  have been located. Such creation errors do not reflect on  the standard of medical care.

## 2020-11-01 ENCOUNTER — Telehealth: Payer: Self-pay | Admitting: Licensed Clinical Social Worker

## 2020-11-01 ENCOUNTER — Encounter: Payer: Self-pay | Admitting: Oncology

## 2020-11-01 ENCOUNTER — Ambulatory Visit (HOSPITAL_BASED_OUTPATIENT_CLINIC_OR_DEPARTMENT_OTHER): Payer: Medicare Other | Admitting: Oncology

## 2020-11-01 DIAGNOSIS — Z170 Estrogen receptor positive status [ER+]: Secondary | ICD-10-CM

## 2020-11-01 DIAGNOSIS — C50412 Malignant neoplasm of upper-outer quadrant of left female breast: Secondary | ICD-10-CM

## 2020-11-01 NOTE — Telephone Encounter (Signed)
[facility]  CSW attempted to contact patient again to follow-up on resources for housing discussed in June. No answer. Left VM with direct contact information.  Patient has also missed appt with Dr. [name]. Pt can reschedule or, if she is living in another city, potentially have care arranged there or have her tamoxifen prescription called in to a local pharmacy per Dr. [name].   Christeen Douglas, LCSW

## 2020-11-01 NOTE — Telephone Encounter (Signed)
Received return call from patient. She reports that she is staying in her previous housing until 11/08/2020 and will then be living in a camper on her son's property until she is able to get off of a waitlist for housing in [place]. She is happy with this arrangement as [place] [facility], she has her own space but is close to family, and feels more stable.    Patient reports that she did not realize she had an appointment this morning with Dr. [name]. She plans to reschedule and continue follow-up care here at [facility].   Christeen Douglas, LCSW

## 2020-11-07 ENCOUNTER — Ambulatory Visit: Payer: Self-pay | Admitting: *Deleted

## 2020-11-07 NOTE — Telephone Encounter (Signed)
Reason for Disposition  [1] Single large node AND [2] size > 1 inch (2.5 cm) AND [3] no fever  Answer Assessment - Initial Assessment Questions 1. LOCATION: "Where is the swollen node located?" "Is the matching node on the other side of the body also swollen?"      R- neck 2. SIZE: "How big is the node?" (e.g., inches or centimeters; or compared to common objects such as pea, bean, marble, golf ball)      Today-pecan size 3. ONSET: "When did the swelling start?"      Saturday 4. NECK NODES: "Is there a sore throat, runny nose or other symptoms of a cold?"      no 5. GROIN OR ARMPIT NODES: "Is there a sore, scratch, cut or painful red area on that arm or leg?"      no 6. FEVER: "Do you have a fever?" If Yes, ask: "What is it, how was it measured, and when did it start?"      no 7. CAUSE: "What do you think is causing the swollen lymph nodes?"     unsure 8. OTHER SYMPTOMS: "Do you have any other symptoms?"     Sore to touch 9. PREGNANCY: "Is there any chance you are pregnant?" "When was your last menstrual period?"     N/a  Protocols used: Lymph Nodes - Swollen-A-AH

## 2020-11-07 NOTE — Telephone Encounter (Signed)
Patient is calling to report she has swollen lymph node- R jaw line- since Saturday. No fever,sore throat or cold symptoms- node is tender to touch- grape/pecan size. Advised be seen- UC - no office appointment available.

## 2020-11-07 NOTE — Telephone Encounter (Signed)
Please contact pt and schedule an appt

## 2020-11-08 ENCOUNTER — Other Ambulatory Visit: Payer: Self-pay

## 2020-11-08 ENCOUNTER — Encounter: Payer: Self-pay | Admitting: Emergency Medicine

## 2020-11-08 ENCOUNTER — Ambulatory Visit
Admission: EM | Admit: 2020-11-08 | Discharge: 2020-11-08 | Disposition: A | Discharge status: Home / Self Care | Payer: Medicare Other | Attending: Emergency Medicine | Admitting: Emergency Medicine

## 2020-11-08 DIAGNOSIS — R591 Generalized enlarged lymph nodes: Secondary | ICD-10-CM | POA: Diagnosis not present

## 2020-11-08 MED ORDER — AZITHROMYCIN 250 MG PO TABS: 250.0000 mg | ORAL_TABLET | Freq: Every day | ORAL | 0 refills | Status: DC

## 2020-11-08 NOTE — ED Provider Notes (Signed)
[facility]    CSN: [id number] Arrival date & time: 11/08/20  1054      History   Chief Complaint Chief Complaint  Patient presents with   Lymphadenopathy     HPI [name] is a 60 y.o. female.  Patient presents with painful swollen lymph node on the right side of her neck x1 week.  She states the pain radiates to her jaw and teeth.  She has new dentures.  She denies fever, chills, rash, sore throat, cough, or other symptoms.  OTC treatment attempted at home.  Her medical history includes rheumatoid arthritis, breast cancer, COPD.  The history is provided by the patient and medical records.   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   Adenomatous colon polyp 03/16/2019   Degenerative joint disease of shoulder region 06/08/2018   Rheumatoid arthritis involving both hands with positive rheumatoid factor ([place]) 06/08/2018   Rheumatoid arthritis involving both feet with positive rheumatoid factor ([place]) 06/08/2018   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   Status post total hip replacement, 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   Smoker 06/08/2018   Anxiety and depression 06/08/2018   Family history of breast cancer 03/25/2018   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   Rheumatoid arthritis involving multiple sites ([place]) 01/15/2018   Tobacco dependence 01/15/2018   Opioid use agreement exists 01/15/2018   Positive depression screening 01/15/2018    Homeless 01/15/2018    Past Surgical History:  Procedure Laterality Date   BREAST LUMPECTOMY WITH RADIOACTIVE SEED AND SENTINEL LYMPH NODE BIOPSY Left 08/30/2019   Procedure: LEFT BREAST LUMPECTOMY X 2 WITH RADIOACTIVE SEED AND SENTINEL LYMPH NODE BIOPSY;  Surgeon: [name], MD;  Location: [place];  Service: General;  Laterality: Left;  PEC BLOCK   COLONOSCOPY WITH PROPOFOL N/A 03/15/2019   Procedure: COLONOSCOPY WITH PROPOFOL;  Surgeon: Jonathon Bellows, MD;  Location: [facility] ENDOSCOPY;  Service: Gastroenterology;  Laterality: N/A;   HIP ARTHROPLASTY     JOINT REPLACEMENT     MULTIPLE TOOTH EXTRACTIONS     RE-EXCISION OF BREAST LUMPECTOMY Left 10/07/2019   Procedure: RE-EXCISION OF LEFT BREAST INFERIOR MARGIN;  Surgeon: [name], MD;  Location: [place];  Service: General;  Laterality: Left;   TOTAL HIP ARTHROPLASTY Left 02/15/2018   Procedure: TOTAL HIP ARTHROPLASTY ANTERIOR APPROACH;  Surgeon: [name], MD;  [facility];  Service: Orthopedics;  Laterality: Left;   WISDOM TOOTH EXTRACTION      OB History   No obstetric history on file.      Home Medications    Prior to Admission medications   Medication Sig Start Date End Date Taking? Authorizing Provider  azithromycin (ZITHROMAX) 250 MG tablet Take 1 tablet (250 mg total) by mouth daily. Take first 2 tablets together, then 1 every  day until finished. 11/08/20  Yes Sharion Balloon, NP  albuterol (VENTOLIN HFA) 108 (90 Base) MCG/ACT inhaler TAKE 2 PUFFS BY MOUTH EVERY 6 HOURS AS NEEDED FOR WHEEZE OR SHORTNESS OF BREATH 10/19/20   Ladell Pier, MD  Cholecalciferol (VITAMIN D3) 75 MCG (3000 UT) TABS Take 3,000 Units by mouth daily.    [provider]  cyanocobalamin 100 MCG tablet Take 100 mcg by mouth daily.    [provider]  diazepam (VALIUM) 2 MG tablet Take 1/2 tablet (1 mg total) by mouth before procedure. 08/22/20   Magrinat, Virgie Dad, MD  Etanercept (ENBREL MINI) 50 MG/ML SOCT INJECT  50 MG INTO THE SKIN ONCE A WEEK. 10/25/20 10/25/21  Bo Merino, MD  folic acid (FOLVITE) 1 MG tablet TAKE 2 TABLETS BY MOUTH ONCE DAILY 02/21/20 02/20/21  Ofilia Neas, PA-C  Methotrexate, PF, (OTREXUP) 20 MG/0.4ML SOAJ INJECT 20 MG INTO THE SKIN ONCE A WEEK. 10/03/20 10/03/21  Ofilia Neas, PA-C  Multiple Vitamin (MULTIVITAMIN WITH MINERALS) TABS tablet Take 1 tablet by mouth daily.    [provider]  nicotine (NICODERM CQ - DOSED IN MG/24 HOURS) 14 mg/24hr patch PLACE 1 PATCH (14 MG TOTAL) ONTO THE SKIN DAILY. 08/09/20   Ladell Pier, MD  OTREXUP 20 MG/0.4ML SOAJ INJECT 20 MG INTO THE SKIN ONCE A WEEK. 05/23/20   Bo Merino, MD  SYMBICORT 80-4.5 MCG/ACT inhaler TAKE 2 PUFFS BY MOUTH TWICE A DAY 04/11/20   Ladell Pier, MD  tamoxifen (NOLVADEX) 20 MG tablet TAKE 1 TABLET BY MOUTH ONCE A DAY 01/30/20 01/29/21  Causey, Charlestine Massed, NP    Family History Family History  Problem Relation Age of Onset   Breast cancer Mother 32       metastatic   Cervical cancer Mother    Lung cancer Brother        maternal half brother; d. 82s   Lung cancer Maternal Uncle        d. 59s   Lung cancer Maternal Uncle        d. 64   Cancer Maternal Grandmother        unknown type; dx 28s   Healthy Son    Breast cancer Maternal Great-grandmother        dx unknown age    Social History Social History   Tobacco Use   Smoking status: Some Days    Packs/day: 0.25    Types: Cigarettes    Last attempt to quit: 09/08/2019    Years since quitting: 1.1   Smokeless tobacco: Never   Tobacco comments:    on nicotine patches, rarely smokes  Vaping Use   Vaping Use: Never used  Substance Use Topics   Alcohol use: Yes    Comment: rarely    Drug use: Not Currently    Types: Cocaine    Comment: last used in 2004     Allergies   Other   Review of Systems Review of Systems  Constitutional:  Negative for chills and fever.  HENT:  Negative for ear pain and sore throat.         Painful enlarged lymph node.  Eyes:  Negative for pain and visual disturbance.  Respiratory:  Negative for cough and shortness of breath.   Cardiovascular:  Negative for chest pain and palpitations.  Gastrointestinal:  Negative for abdominal pain and vomiting.  Genitourinary:  Negative for dysuria and hematuria.  Musculoskeletal:  Negative for arthralgias and back pain.  Skin:  Negative for color change and rash.  Neurological:  Negative for seizures and syncope.  All other systems reviewed and are negative.   Physical Exam Triage Vital Signs ED Triage Vitals  Enc Vitals Group     BP      Pulse      Resp      Temp      Temp src      SpO2      Weight      Height      Head Circumference      Peak Flow      Pain Score      Pain Loc      Pain Edu?      Excl. in [place]?    No data found.  Updated Vital Signs BP 106/70 (BP Location: Left Arm)   Pulse 86   Temp 98.1 F (36.7 C) (Oral)   Resp 18   SpO2 94%   Visual Acuity Right Eye Distance:   Left Eye Distance:   Bilateral Distance:    Right Eye Near:   Left Eye Near:    Bilateral Near:     Physical Exam Vitals and nursing note reviewed.  Constitutional:      General: She is not in acute distress.    Appearance: She is well-developed.  HENT:     Head: Normocephalic and atraumatic.     Right Ear: Tympanic membrane normal.     Left Ear: Tympanic membrane normal.     Nose: Nose normal.     Mouth/Throat:     Mouth: Mucous membranes are moist.     Pharynx: Oropharynx is clear.  Eyes:     Conjunctiva/sclera: Conjunctivae normal.  Neck:     Comments: Tender, slightly enlarged right submandibular lymph node. Cardiovascular:     Rate and Rhythm: Normal rate and regular rhythm.     Heart sounds: Normal heart sounds.  Pulmonary:     Effort: Pulmonary effort is normal. No respiratory distress.     Breath sounds: Normal breath sounds.  Abdominal:     Palpations: Abdomen is soft.     Tenderness: There is no  abdominal tenderness.  Musculoskeletal:     Cervical back: Neck supple. No rigidity.  Lymphadenopathy:     Cervical: Cervical adenopathy present.  Skin:    General: Skin is warm and dry.  Neurological:     General: No focal deficit present.     Mental Status: She is alert and oriented to person, place, and time.     Gait: Gait normal.  Psychiatric:        Mood and Affect: Mood normal.        Behavior: Behavior normal.     UC Treatments / Results  Labs (all labs ordered are listed, but only abnormal results are displayed) Labs Reviewed - No data to display  EKG   Radiology No results found.  Procedures Procedures (including critical care time)  Medications Ordered in UC Medications - No data to display  Initial Impression / Assessment and Plan / UC Course  I have reviewed the triage vital signs and the nursing notes.  Pertinent labs & imaging results that were available during my care of the patient were reviewed by me and considered in my medical decision making (see chart for details).  Lymphadenopathy.  Treating with Zithromax (patient is on methotrexate so not prescribing amoxicillin).  Instructed patient to follow-up with her PCP in 1 week for a recheck.  Education provided on lymphadenopathy.  Patient agrees to plan of care.   Final Clinical Impressions(s) / UC Diagnoses   Final diagnoses:  Lymphadenopathy     Discharge Instructions      Take the Zithromax as directed.    Follow-up with your primary care provider in 1 week for recheck.         ED Prescriptions     Medication Sig Dispense Auth. Provider   azithromycin (ZITHROMAX) 250 MG tablet Take 1 tablet (250 mg total) by mouth daily. Take first 2 tablets together, then 1 every day until finished. 6 tablet Sharion Balloon, NP      PDMP not reviewed this encounter.   Sharion Balloon, NP 11/08/20 1128

## 2020-11-08 NOTE — ED Triage Notes (Signed)
Pt here with right jaw pain and swollen lymph node on that side x 1 week. Got new bottom partial dentures on the bottom that have been bothering her so unsure if that is contributing. No ear pain.

## 2020-11-08 NOTE — Discharge Instructions (Addendum)
Take the Zithromax as directed.    Follow-up with your primary care provider in 1 week for recheck.

## 2020-11-13 ENCOUNTER — Other Ambulatory Visit (HOSPITAL_COMMUNITY): Payer: Self-pay

## 2020-11-13 MED FILL — Tamoxifen Citrate Tab 20 MG (Base Equivalent): ORAL | 90 days supply | Qty: 90 | Fill #0 | Status: AC

## 2020-11-14 ENCOUNTER — Encounter: Payer: Self-pay | Admitting: Physician Assistant

## 2020-11-14 ENCOUNTER — Other Ambulatory Visit: Payer: Self-pay

## 2020-11-14 ENCOUNTER — Ambulatory Visit (INDEPENDENT_AMBULATORY_CARE_PROVIDER_SITE_OTHER): Payer: Medicare Other | Admitting: Physician Assistant

## 2020-11-14 VITALS — BP 105/69 | HR 80 | Ht 65.5 in | Wt 107.6 lb

## 2020-11-14 DIAGNOSIS — F17200 Nicotine dependence, unspecified, uncomplicated: Secondary | ICD-10-CM

## 2020-11-14 DIAGNOSIS — Z96642 Presence of left artificial hip joint: Secondary | ICD-10-CM

## 2020-11-14 DIAGNOSIS — M19211 Secondary osteoarthritis, right shoulder: Secondary | ICD-10-CM

## 2020-11-14 DIAGNOSIS — Z803 Family history of malignant neoplasm of breast: Secondary | ICD-10-CM

## 2020-11-14 DIAGNOSIS — Z79899 Other long term (current) drug therapy: Secondary | ICD-10-CM

## 2020-11-14 DIAGNOSIS — D0512 Intraductal carcinoma in situ of left breast: Secondary | ICD-10-CM

## 2020-11-14 DIAGNOSIS — M0579 Rheumatoid arthritis with rheumatoid factor of multiple sites without organ or systems involvement: Secondary | ICD-10-CM

## 2020-11-14 DIAGNOSIS — M19212 Secondary osteoarthritis, left shoulder: Secondary | ICD-10-CM

## 2020-11-14 DIAGNOSIS — F419 Anxiety disorder, unspecified: Secondary | ICD-10-CM

## 2020-11-14 DIAGNOSIS — F32A Depression, unspecified: Secondary | ICD-10-CM

## 2020-11-14 NOTE — Patient Instructions (Signed)

## 2020-11-21 ENCOUNTER — Ambulatory Visit
Admission: RE | Admit: 2020-11-21 | Discharge: 2020-11-21 | Disposition: A | Discharge status: Home / Self Care | Payer: Medicare Other | Source: Ambulatory Visit | Attending: Adult Health | Admitting: Adult Health

## 2020-11-21 ENCOUNTER — Other Ambulatory Visit: Payer: Self-pay

## 2020-11-21 ENCOUNTER — Telehealth: Payer: Self-pay

## 2020-11-21 DIAGNOSIS — C50412 Malignant neoplasm of upper-outer quadrant of left female breast: Secondary | ICD-10-CM

## 2020-11-21 DIAGNOSIS — Z170 Estrogen receptor positive status [ER+]: Secondary | ICD-10-CM

## 2020-11-21 DIAGNOSIS — D0512 Intraductal carcinoma in situ of left breast: Secondary | ICD-10-CM

## 2020-11-21 NOTE — Telephone Encounter (Signed)
Patient states she had a rash on the palm of her hands.  Patient states it is very itchy, red, and bumpy.  She has tried hydrocortisone cream which is not helping. Patient states she is on Otrexup and Enbrel. Patient states her last injection was 11/13/2020. Would you recommend patient take benadryl or have evaluation of rash?

## 2020-11-21 NOTE — Telephone Encounter (Signed)
Left message to advise patient she should see her PCP or a dermatologist for evaluation of the rash.

## 2020-11-21 NOTE — Telephone Encounter (Signed)
Patient should see her PCP or a dermatologist for evaluation of the rash.

## 2020-11-21 NOTE — Telephone Encounter (Signed)
Patient called stating she has a rash on the palm of her hands.  Patient states it is very itchy, red, and bumpy.  She has tried hydrocortisone cream, but it hasn't helped and is not sure if it is a reaction to her medications.  Patient requested a return call.

## 2020-11-22 IMAGING — MG MM PLC BREAST LOC DEV 1ST LESION INC MAMMO GUIDE*L*
8 of 27 series · 8 of 40 positions shown · non-contrast
Comparison: Previous exam(s).

CLINICAL DATA: Biopsy proven ductal carcinoma in situ with
calcifications (coil shaped clip) and biopsy proven grade l invasive
ductal carcinoma, atypical lobular hyperplasia and calcifications in
the upper-outer quadrant, posterior (X shaped clip) in the left
breast.

EXAM:
MAMMOGRAPHIC GUIDED RADIOACTIVE SEED LOCALIZATION OF THE LEFT BREAST

[L LM (1 of 3)]
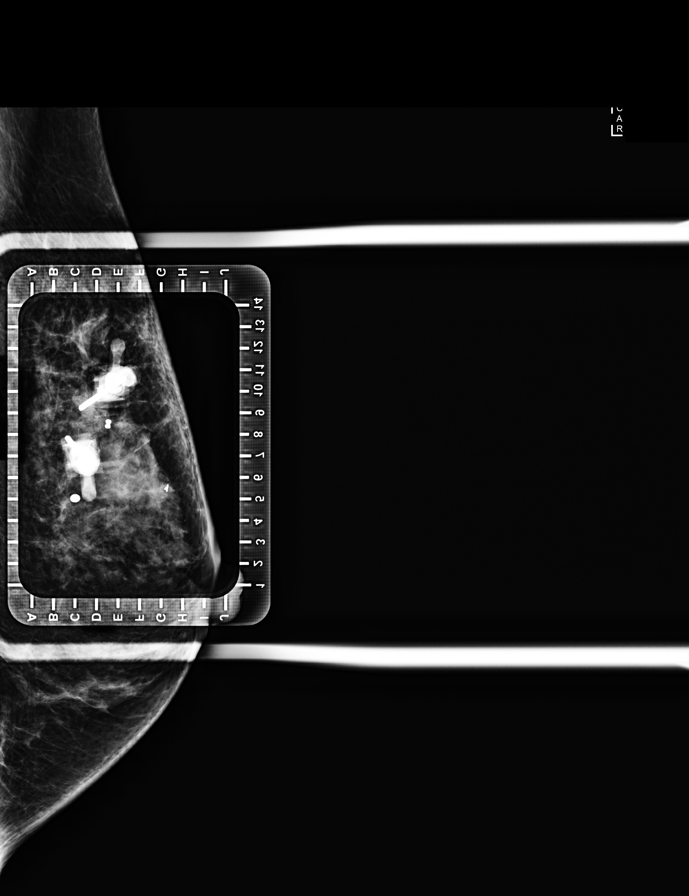

[L XCCL (1 of 5)]
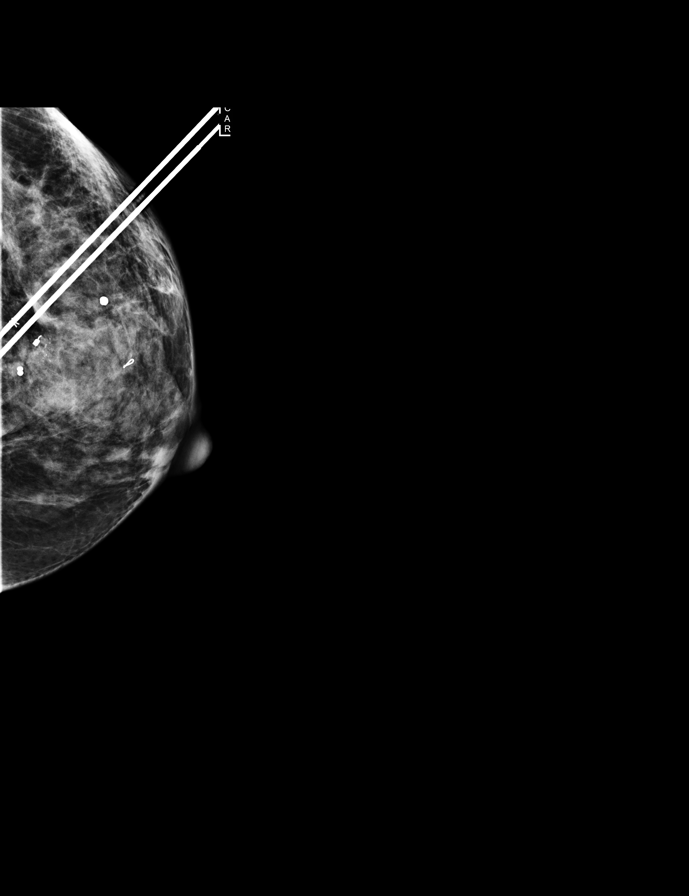

[L XCCL (2 of 5)]
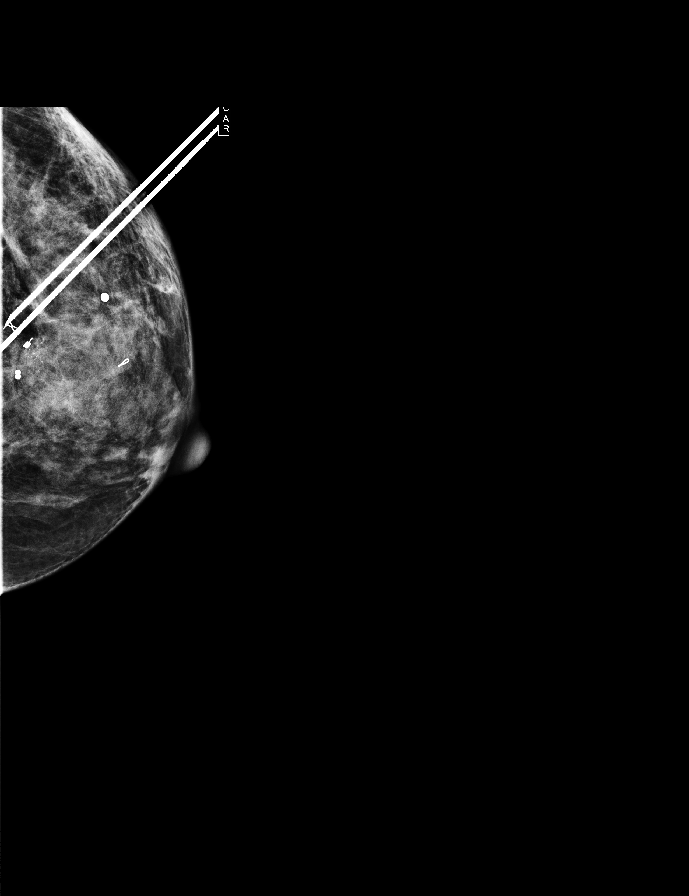

[L XCCL (3 of 5)]
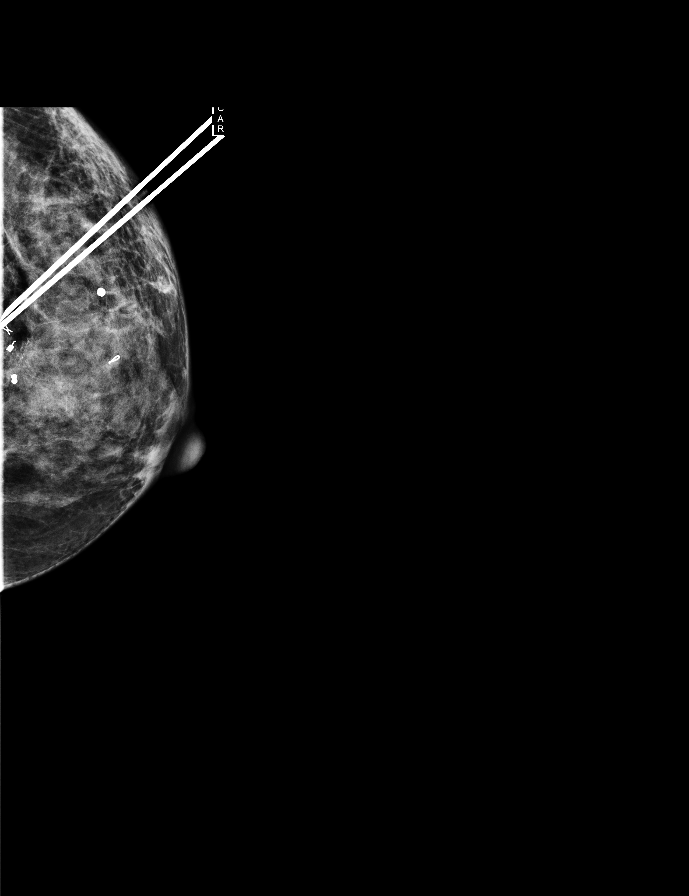

[L LM (2 of 3)]
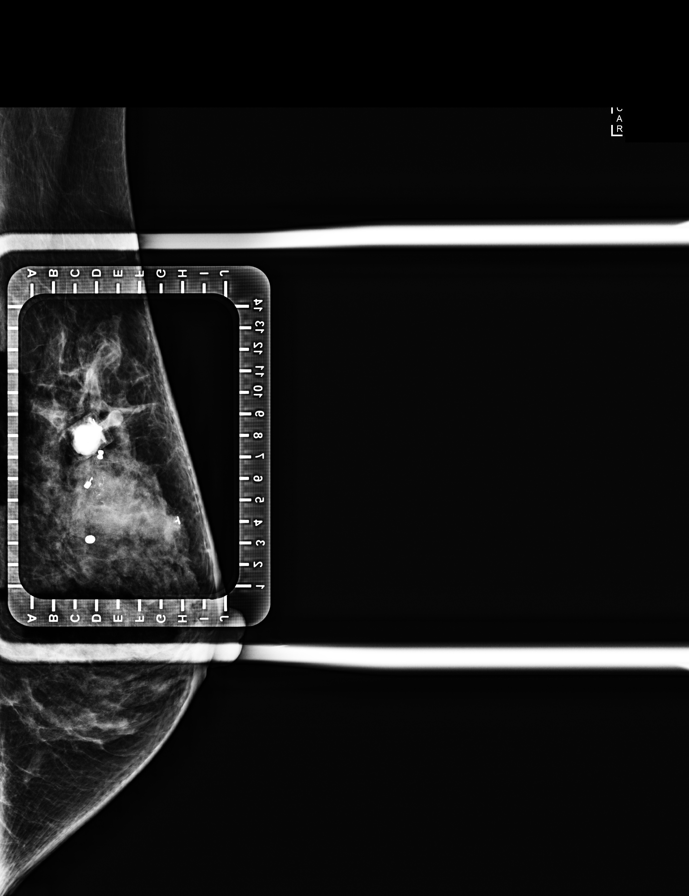

[L XCCL (4 of 5)]
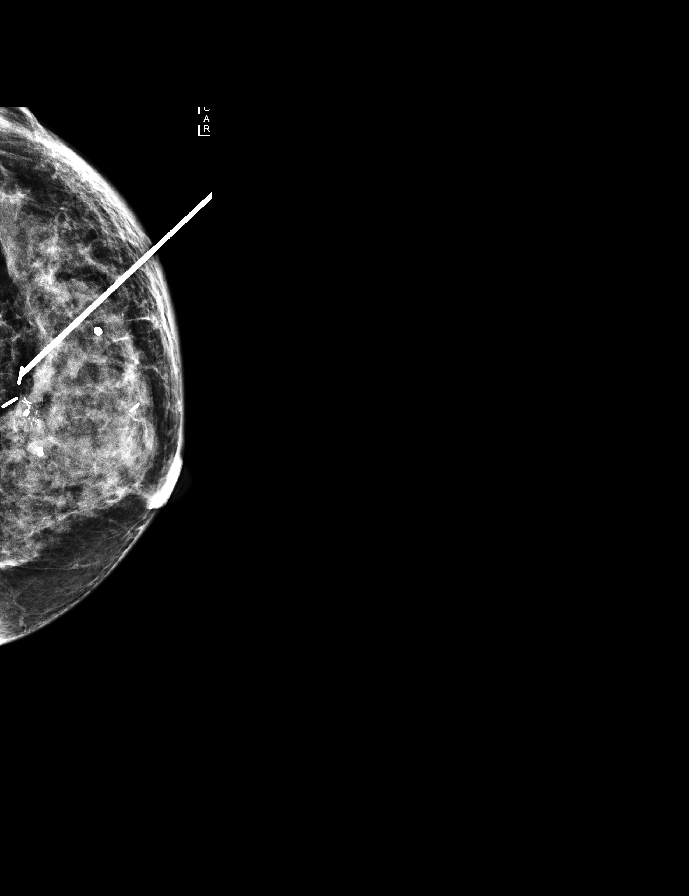

[L XCCL (5 of 5)]
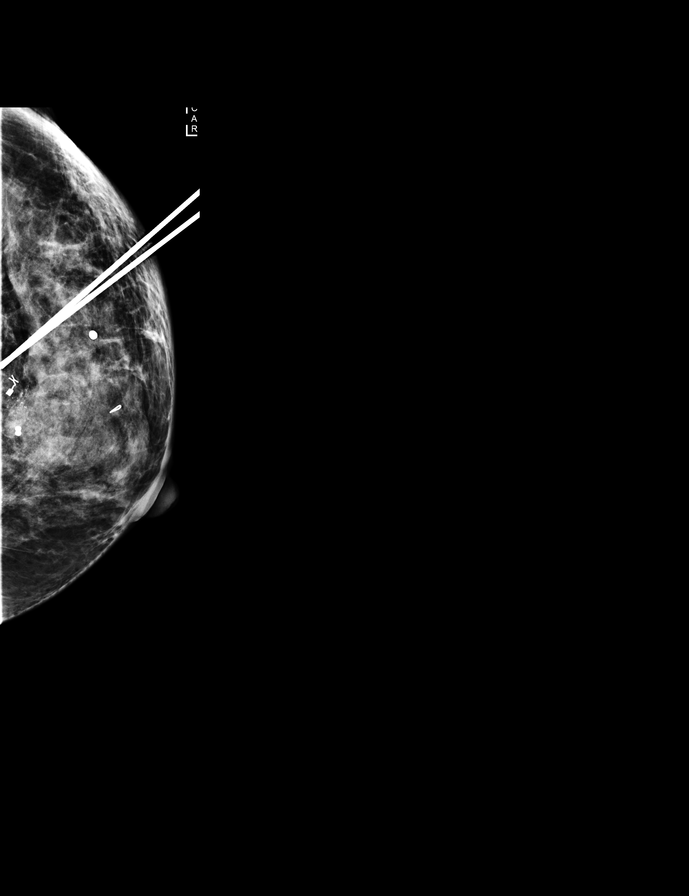

[L LM (3 of 3)]
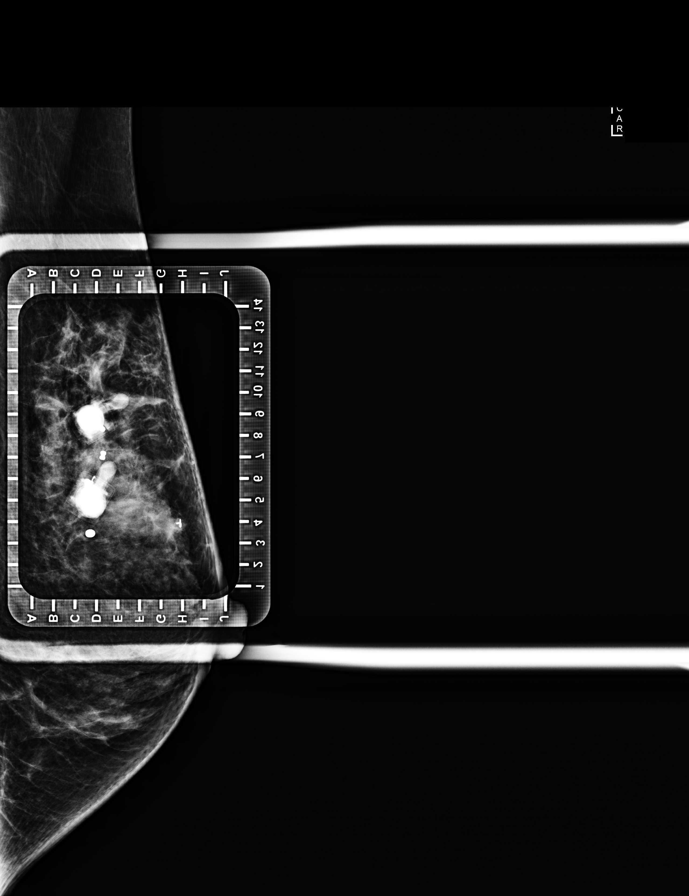

[8 of 40 positions shown; findings below may reference images not displayed]



The usual time-out protocol was performed immediately prior to the
procedure.

Using mammographic guidance, sterile technique, 1% lidocaine and an
T-JVS radioactive seed, the coil shaped clip was localized using a
lateral to medial approach. The follow-up mammogram images confirm
the seed in the expected location and were marked for Dr. Kardiatou.

Follow-up survey of the patient confirms presence of the radioactive
seed.

Order number of T-JVS seed:  [id number].

Total activity:  0.247 millicuries reference Date: 08/04/2019

The patient tolerated the procedure well and was released from the
[REDACTED]. She was given instructions regarding seed removal.

Patient presents for radioactive seed localization prior to surgery.
I met with the patient and we discussed the procedure of seed
localization including benefits and alternatives. We discussed the
high likelihood of a successful procedure. We discussed the risks of
the procedure including infection, bleeding, tissue injury and
further surgery. We discussed the low dose of radioactivity involved
in the procedure. Informed, written consent was given.

The usual time-out protocol was performed immediately prior to the
procedure.

Using mammographic guidance, sterile technique, 1% lidocaine and an
T-JVS radioactive seed, the X shaped clip was localized using a
lateral to medial approach. The follow-up mammogram images confirm
the seed in the expected location and were marked for Dr. Kardiatou.

Follow-up survey of the patient confirms presence of the radioactive
seed.

Order number of T-JVS seed:  [id number].

Total activity:  0.247 millicuries reference Date: 08/04/2019

The patient tolerated the procedure well and was released from the
[REDACTED]. She was given instructions regarding seed removal.
IMPRESSION: Radioactive seed localizations of coil and X shaped clips in the
left breast. The radioactive seed is located approximately 7 mm
inferior and lateral to the X shaped clip and the radioactive seed
is located 5 mm posterior and superior to the coil shaped clip.

## 2020-11-27 ENCOUNTER — Other Ambulatory Visit (HOSPITAL_COMMUNITY): Payer: Self-pay

## 2020-11-27 ENCOUNTER — Other Ambulatory Visit: Payer: Self-pay

## 2020-11-27 ENCOUNTER — Inpatient Hospital Stay: Payer: Medicare Other | Attending: Adult Health | Admitting: Oncology

## 2020-11-27 ENCOUNTER — Other Ambulatory Visit: Payer: Self-pay | Admitting: Rheumatology

## 2020-11-27 VITALS — BP 112/70 | HR 94 | Temp 97.5°F | Resp 20 | Ht 65.0 in | Wt 106.8 lb

## 2020-11-27 DIAGNOSIS — Z79810 Long term (current) use of selective estrogen receptor modulators (SERMs): Secondary | ICD-10-CM | POA: Diagnosis not present

## 2020-11-27 DIAGNOSIS — Z803 Family history of malignant neoplasm of breast: Secondary | ICD-10-CM | POA: Insufficient documentation

## 2020-11-27 DIAGNOSIS — F17210 Nicotine dependence, cigarettes, uncomplicated: Secondary | ICD-10-CM | POA: Insufficient documentation

## 2020-11-27 DIAGNOSIS — C50412 Malignant neoplasm of upper-outer quadrant of left female breast: Secondary | ICD-10-CM | POA: Insufficient documentation

## 2020-11-27 DIAGNOSIS — Z923 Personal history of irradiation: Secondary | ICD-10-CM | POA: Diagnosis not present

## 2020-11-27 DIAGNOSIS — Z170 Estrogen receptor positive status [ER+]: Secondary | ICD-10-CM | POA: Diagnosis not present

## 2020-11-27 DIAGNOSIS — D0512 Intraductal carcinoma in situ of left breast: Secondary | ICD-10-CM

## 2020-11-27 DIAGNOSIS — Z87891 Personal history of nicotine dependence: Secondary | ICD-10-CM | POA: Diagnosis not present

## 2020-11-27 DIAGNOSIS — M069 Rheumatoid arthritis, unspecified: Secondary | ICD-10-CM | POA: Diagnosis not present

## 2020-11-27 MED ORDER — ENBREL MINI 50 MG/ML ~~LOC~~ SOCT
50.0000 mg | SUBCUTANEOUS | 0 refills | Status: DC
  Filled 2020-11-27: qty 4, 28d supply, fill #0
  Filled 2020-12-26: qty 4, 28d supply, fill #1
  Filled 2021-01-24: qty 4, 28d supply, fill #2

## 2020-11-27 MED ORDER — TAMOXIFEN CITRATE 20 MG PO TABS: ORAL_TABLET | Freq: Every day | ORAL | 3 refills | Status: AC

## 2020-11-27 NOTE — Progress Notes (Signed)
[facility]  [phone] Fax:([phone]) [phone]     ID: [name] DOB: 08-16-1960  MR#: [id number]  KZL#:[id number]  Patient Care Team: Ladell Pier, MD as PCP - General (Internal Medicine) Emmie Frakes, Virgie Dad, MD as Consulting Physician (Oncology) [name], MD as Consulting Physician (General Surgery) Bo Merino, MD as Consulting Physician (Rheumatology) Beverely Pace, LCSW as Social Worker ([place]) Su Monks as Physician Assistant (Physician Assistant) Chauncey Cruel, MD OTHER MD:  CHIEF COMPLAINT: estrogen receptor positive breast cancer  CURRENT TREATMENT: tamoxifen   INTERVAL HISTORY: [name] returns today for follow up of her invasive breast cancer.  She had missed her appointment with [place] on 11/01/2020 but things are now "stabilized".  The social history is detailed further below  She was started on tamoxifen on 01/30/2020.  She is tolerating this well, with no significant hot flashes or vaginal wetness.  She has a little bit of vaginal dryness due to menopause.  She has gained 10 pounds since her last visit and she wondered if tamoxifen was the cause but also of course she is now eating a more regular diet.  She underwent genetic counseling on 08/22/2020. She declined to proceed with testing at that time due to stress with her housing situation.  At that visit also she reported that she was going to be homeless as she could no longer stay where she had been living and her family could not take her.  She was given information on shelters by social work [name])   She had bilateral diagnostic mammography at the [facility] 11/21/2020 showing breast density category C.  There was no evidence of malignancy.  REVIEW OF SYSTEMS: [name] is now greatly benefiting from counseling and family support.  She is staying at her son's home in [place].  She still keeps a foot in [place] which is where her counseling  support is, but getting here is difficult and expensive for her and she would like to start moving services to the [place] area.  She is working part-time through a Brewing technologist and this gives her a little spending money.  She tells me she has greatly improved her diet.  She is still a bit fatigued and she attributes this to her rheumatoid arthritis.  This is being treated aggressively by rheumatology.  Aside from these issues a detailed review of systems today was stable   COVID 19 VACCINATION STATUS: [place] x2, no booster as of September 2022   HISTORY OF CURRENT ILLNESS: From the original intake note:  [name] had routine screening mammography on 05/10/2019 showing a possible abnormality in the left breast. She underwent left diagnostic mammography with tomography at [facility] on 05/27/2019 showing: breast density category C; indeterminate 0.9 cm left breast calcifications.  There was no axillary ultrasonography.  Accordingly on 06/13/2019 she proceeded to biopsy of the left breast area in question. The pathology from this procedure ([id number]) showed: ductal carcinoma in situ, intermediate grade. Prognostic indicators significant for: estrogen receptor, 100% positive and progesterone receptor, 60% positive, both with strong staining intensity.   She underwent breast MRI on 06/24/2019 showing: breast composition D; suspicious 8 mm enhancing mass located approximately 2.5 cm superior and medial to biopsy site; no evidence of right breast malignancy; no suspicious lymphadenopathy within limits of the study.  MRI biopsy of the enhancing mass in the upper central left breast was performed today, with results pending.  The patient's subsequent history is  as detailed below.   PAST MEDICAL HISTORY: Past Medical History:  Diagnosis Date   Anemia    during pregnancy   Anxiety    Asthma    Bronchitis age 28   Cancer ([place])    left breast ca   COPD (chronic obstructive pulmonary  disease) (HCC)    Depression    Hx of bladder infections    been over 5 years since last one    Rheumatoid arthritis ([place])    unc rheum    Tobacco abuse    Trouble in sleeping     PAST SURGICAL HISTORY: Past Surgical History:  Procedure Laterality Date   BREAST LUMPECTOMY WITH RADIOACTIVE SEED AND SENTINEL LYMPH NODE BIOPSY Left 08/30/2019   Procedure: LEFT BREAST LUMPECTOMY X 2 WITH RADIOACTIVE SEED AND SENTINEL LYMPH NODE BIOPSY;  Surgeon: [name], MD;  Location: [facility];  Service: General;  Laterality: Left;  PEC BLOCK   COLONOSCOPY WITH PROPOFOL N/A 03/15/2019   Procedure: COLONOSCOPY WITH PROPOFOL;  Surgeon: Jonathon Bellows, MD;  Location[facility] END[facility]COPY;  Service: Gastroenterology;  Laterality: N/A;   HIP ARTHROPLASTY     JOINT REPLACEMENT     MULTIPLE TOOTH EXTRACTIONS     RE-EXCISION OF BREAST LUMPECTOMY Left 10/07/2019   Procedure: RE-EXCISION OF LEFT BREAST INFERIOR MARGIN;  Surgeon: [name], MD;  Location: [facility];  Service: General;  Laterality: Left;   TOTAL HIP ARTHROPLASTY Left 02/15/2018   Procedure: TOTAL HIP ARTHROPLASTY ANTERIOR APPROACH;  Surgeon: [name], MD;  Location: [facility];  Service: Orthopedics;  Laterality: Left;   WISDOM TOOTH EXTRACTION      FAMILY HISTORY: Family History  Problem Relation Age of Onset   Breast cancer Mother 39       metastatic   Cervical cancer Mother    Lung cancer Brother        maternal half brother; d. 70s   Lung cancer Maternal Uncle        d. 3s   Lung cancer Maternal Uncle        d. 63   Cancer Maternal Grandmother        unknown type; dx 17s   Healthy Son    Breast cancer Maternal Great-grandmother        dx unknown age  The patient's father died in his 57s.  The patient tells me she essentially has no information on him or his side of the family.  The patient's mother was diagnosed with breast cancer age 39 and died age 55.  She also had a history of uterine cancer.  The patient's mother's  mother died at age 69 and developed cancer at age 1.  She does not know what kind of cancer this was.  The patient's mother had 5 sisters and 5 brothers.  1 brother had lung cancer and died at age 69 from it, in the setting of tobacco abuse.  The patient tells me she has 66 Cousins and that the son of one of her cousins has died from cancer but she does not know the type. ADDENDUM: The patient tells me she has found out that her great aunt and and and on her mother side both had breast cancer.   GYNECOLOGIC HISTORY:  No LMP recorded. Patient is postmenopausal. Menarche: 60 years old Age at first live birth: 60 years old [place] P 1 LMP 8 Contraceptive intermittently, with no complications HRT no  Hysterectomy?  No BSO?  No   SOCIAL HISTORY: (  updated September 2020 [name] is currently staying with her son in [place].  She is on disability secondary to her hip problems.  Her son [name] is a Furniture conservator/restorer.  The patient has 2 step grandchildren aged 72 and 20, the 55 year old being special needs.  The patient describes herself as spiritual and nondenominational   ADVANCED DIRECTIVES: At the 11/25/2019 visit the patient was given the appropriate documents to complete and notarized at her discretion; she intends to name her son [name] as healthcare power of attorney   HEALTH MAINTENANCE: Social History   Tobacco Use   Smoking status: Some Days    Packs/day: 0.25    Types: Cigarettes    Last attempt to quit: 09/08/2019    Years since quitting: 1.2   Smokeless tobacco: Never   Tobacco comments:    on nicotine patches, rarely smokes  Vaping Use   Vaping Use: Never used  Substance Use Topics   Alcohol use: Yes    Comment: rarely    Drug use: Not Currently    Types: Cocaine    Comment: last used in 2004     Colonoscopy: 03/2019 (Dr. Vicente Males), repeat 2024  PAP: 03/2018, negative  Bone density: none on file   Allergies  Allergen Reactions   Other Rash    Unknown cleaner used in OR     Current Outpatient Medications  Medication Sig Dispense Refill   albuterol (VENTOLIN HFA) 108 (90 Base) MCG/ACT inhaler TAKE 2 PUFFS BY MOUTH EVERY 6 HOURS AS NEEDED FOR WHEEZE OR SHORTNESS OF BREATH 18 each 1   Cholecalciferol (VITAMIN D3) 75 MCG (3000 UT) TABS Take 3,000 Units by mouth daily.     cyanocobalamin 100 MCG tablet Take 100 mcg by mouth daily.     diazepam (VALIUM) 2 MG tablet Take 1/2 tablet (1 mg total) by mouth before procedure. (Patient not taking: Reported on 11/14/2020) 1 tablet 0   Etanercept (ENBREL MINI) 50 MG/ML SOCT INJECT 50 MG INTO THE SKIN ONCE A WEEK. 4 mL 0   folic acid (FOLVITE) 1 MG tablet TAKE 2 TABLETS BY MOUTH ONCE DAILY 180 tablet 3   Methotrexate, PF, (OTREXUP) 20 MG/0.4ML SOAJ INJECT 20 MG INTO THE SKIN ONCE A WEEK. 4.8 mL 0   Multiple Vitamin (MULTIVITAMIN WITH MINERALS) TABS tablet Take 1 tablet by mouth daily.     nicotine (NICODERM CQ - DOSED IN MG/24 HOURS) 14 mg/24hr patch PLACE 1 PATCH (14 MG TOTAL) ONTO THE SKIN DAILY. 28 patch 1   OTREXUP 20 MG/0.4ML SOAJ INJECT 20 MG INTO THE SKIN ONCE A WEEK. (Patient not taking: Reported on 11/14/2020) 4.8 mL 0   SYMBICORT 80-4.5 MCG/ACT inhaler TAKE 2 PUFFS BY MOUTH TWICE A DAY 30.6 each 0   tamoxifen (NOLVADEX) 20 MG tablet TAKE 1 TABLET BY MOUTH ONCE A DAY 90 tablet 3   No current facility-administered medications for this visit.    OBJECTIVE: White woman who appears stated age  71:   11/27/20 1553  BP: 112/70  Pulse: 94  Resp: 20  Temp: (!) 97.5 F (36.4 C)  SpO2: 96%      Body mass index is 17.77 kg/m.   Wt Readings from Last 3 Encounters:  11/27/20 106 lb 12.8 oz (48.4 kg)  11/14/20 107 lb 9.6 oz (48.8 kg)  07/30/20 103 lb 6.4 oz (46.9 kg)  ECOG. 1   Sclerae unicteric, EOMs intact Wearing a mask No cervical or supraclavicular adenopathy Lungs no rales or rhonchi Heart regular rate and rhythm  Abd soft, nontender, positive bowel sounds MSK no focal spinal tenderness, no upper  extremity lymphedema Neuro: nonfocal, well oriented, appropriate affect Breasts: The right breast is benign.  The left breast is status post lumpectomy and radiation.  There is no evidence of local recurrence.  Both axillae are benign.  LAB RESULTS:  CMP     Component Value Date/Time   NA 143 10/24/2020 1210   NA 142 01/15/2018 1509   K 4.5 10/24/2020 1210   CL 105 10/24/2020 1210   CO2 28 10/24/2020 1210   GLUCOSE 76 10/24/2020 1210   BUN 16 10/24/2020 1210   BUN 16 01/15/2018 1509   CREATININE 0.84 10/24/2020 1210   CREATININE 0.74 07/30/2020 1310   CALCIUM 9.5 10/24/2020 1210   PROT 7.0 10/24/2020 1210   PROT 6.8 01/15/2018 1509   ALBUMIN 4.1 10/24/2020 1210   ALBUMIN 4.3 01/15/2018 1509   AST 18 10/24/2020 1210   ALT 19 10/24/2020 1210   ALKPHOS 78 10/24/2020 1210   BILITOT 0.8 10/24/2020 1210   GFRNONAA >60 10/24/2020 1210   GFRNONAA 87 07/30/2020 1310   GFRAA 101 07/30/2020 1310    Lab Results  Component Value Date   ALBUMINELP 4.1 05/11/2018   A1GS 0.4 (H) 05/11/2018   A2GS 0.8 05/11/2018   BETS 0.5 05/11/2018   BETA2SER 0.4 05/11/2018   GAMS 0.8 05/11/2018   SPEI  05/11/2018     Comment:     . A poorly-defined band of restricted protein mobility is detected in the gamma globulins. It is unlikely that this may represent a monoclonal protein; however, immunofixation analysis is available if clinically indicated. .     Lab Results  Component Value Date   WBC 4.7 10/24/2020   NEUTROABS 2.6 10/24/2020   HGB 14.5 10/24/2020   HCT 43.1 10/24/2020   MCV 91.5 10/24/2020   PLT 167 10/24/2020    No results found for: LABCA2  No components found for: MGNOIB704  No results for input(s): INR in the last 168 hours.  No results found for: LABCA2  No results found for: UGQ916  No results found for: XIH038  No results found for: UEK800  No results found for: CA2729  No components found for: HGQUANT  No results found for: CEA1 / No results found  for: CEA1   No results found for: AFPTUMOR  No results found for: CHROMOGRNA  No results found for: KPAFRELGTCHN, LAMBDASER, KAPLAMBRATIO (kappa/lambda light chains)  No results found for: HGBA, HGBA2QUANT, HGBFQUANT, HGBSQUAN (Hemoglobinopathy evaluation)   No results found for: LDH  No results found for: IRON, TIBC, IRONPCTSAT (Iron and TIBC)  No results found for: FERRITIN  Urinalysis    Component Value Date/Time   COLORURINE YELLOW 05/11/2018 0957   APPEARANCEUR CLEAR 05/11/2018 0957   LABSPEC 1.015 05/11/2018 0957   PHURINE < OR = 5.0 05/11/2018 0957   GLUCOSEU NEGATIVE 05/11/2018 0957   HGBUR NEGATIVE 05/11/2018 0957   BILIRUBINUR NEGATIVE 02/08/2018 1545   [place] 05/11/2018 0957   PROTEINUR NEGATIVE 05/11/2018 0957   NITRITE NEGATIVE 05/11/2018 0957   LEUKOCYTESUR NEGATIVE 05/11/2018 0957    STUDIES: MM DIAG BREAST TOMO BILATERAL  Result Date: 11/21/2020 CLINICAL DATA:  Status post left lumpectomy for breast cancer in 2021, followed by radiation therapy. EXAM: DIGITAL DIAGNOSTIC BILATERAL MAMMOGRAM WITH TOMOSYNTHESIS AND CAD TECHNIQUE: Bilateral digital diagnostic mammography and breast tomosynthesis was performed. The images were evaluated with computer-aided detection. COMPARISON:  Previous exam(s). ACR Breast Density Category c: The breast tissue is  heterogeneously dense, which may obscure small masses. FINDINGS: Post lumpectomy changes on the left in the posterior upper outer quadrant. A ribbon shaped biopsy marker clip from a previous benign biopsy remains in that region of the breast. No residual biopsied calcifications remain. No interval findings suspicious for malignancy in either breast. IMPRESSION: No evidence of malignancy. RECOMMENDATION: Bilateral diagnostic mammogram in 1 year. I have discussed the findings and recommendations with the patient. If applicable, a reminder letter will be sent to the patient regarding the next appointment. BI-RADS  CATEGORY  2: Benign. Electronically Signed   By: Claudie Revering M.D.   On: 11/21/2020 14:04    ELIGIBLE FOR AVAILABLE RESEARCH PROTOCOL: Not a COMET candidate given presence of a mass  ASSESSMENT: 60 y.o. [place], [place] woman status post left breast biopsy 06/13/2019 for ductal carcinoma in situ, grade 2, estrogen and progesterone receptor positive.  (a) MRI biopsy of a 0.8 cm left breast mass 07/07/2019 showed no evidence of malignancy.  (b) biopsy of the left upper outer quadrant mass 07/22/2019 showed invasive ductal carcinoma, grade 1, estrogen and progesterone receptor positive, HER2 not amplified, with an MIB-1 of 1%  (1) status post left lumpectomy and sentinel lymph node sampling 08/30/2019 for a pT1c pN0, stage IA invasive ductal carcinoma, grade 1, with DCIS focally involving the inferior margin  (a) a total of 4 left axillary lymph nodes were removed.   (b) inferior margin successfully cleared with additional surgery 10/07/2019  (2) Oncotype score of 15 predicts a risk of breast cancer recurrence outside the breast in the next 9 years of 4% if the patient's only systemic therapy is antiestrogens.  It predicts no benefit from chemotherapy.  (3) adjuvant radiation, completed 10 of 20 planned treatments  (4) tamoxifen started 01/30/2020   (5) genetics testing: declined (see discussion 08/22/2020/ [place]); this should be readdressed once the patient is more stable   PLAN: Freja is now both psychologically and socially and more stable.  She is ready to face possible "bad news" such as the results of her screening CT for lung cancer (which I am hopeful of course will be fine).  The same with genetics.  She simply could not handle any more bad news when the world was collapsing around her but now that she has shelter and some security and even some pocket money she is willing to proceed.  Accordingly I am setting up for a screening CT [facility] the lungs at [facility] (getting here is  not only inconvenient but expensive for her).  She also requests that we move further oncology follow-up to our [place] group and I have arranged for that as well today.  Otherwise the plan is to continue tamoxifen a total of 5years.  She just had mammography and will need a repeat mammogram next September.  I have encouraged her to call us with any other issue that may develop.    Total encounter time 25 minutes.Sarajane Jews C. Jeran Hiltz, MD 11/27/20 4:29 PM Medical Oncology and Hematology [facility] [place], [place] [address] Tel. [phone]    Fax. [phone]  Addendum: Subsequent note from our social worker: Received return call from patient. She reports that she is staying in her previous housing until 11/08/2020 and will then be living in a camper on her son's property until she is able to get off of a waitlist for housing in [place]. She is happy with this arrangement as [place], she has her own space but is close to family, and feels more stable.     Patient reports that she did not realize she had an appointment this morning with Dr. [name]. She plans to reschedule and continue follow-up care here at [facility].     Christeen Douglas, LCSW  I, Wilburn Mylar, am acting as scribe for Dr. Virgie Dad. Patsy Varma.  I, Lurline Del MD, have reviewed the above documentation for accuracy and completeness, and I agree with the above.    *Total Encounter Time as defined by the Centers for Medicare and Medicaid Services includes, in addition to the face-to-face time of a patient visit (documented in the note above) non-face-to-face time: obtaining and reviewing outside history, ordering and reviewing medications, tests or procedures, care coordination (communications with other health care professionals or caregivers) and documentation in the medical record.

## 2020-11-27 NOTE — Telephone Encounter (Signed)
Next Visit: 04/17/2021  Last Visit: 11/14/2020  Last Fill:   YB:FXOVANVBTY arthritis involving multiple sites with positive rheumatoid factor   Current Dose per office note 11/14/2020:  Enbrel 50 mg subcutaneous injections once weekl  Labs: 10/24/2020 WNL  TB Gold: 07/30/2020 Neg    Okay to refill Enbrel?

## 2020-11-28 ENCOUNTER — Other Ambulatory Visit (HOSPITAL_COMMUNITY): Payer: Self-pay

## 2020-12-03 ENCOUNTER — Other Ambulatory Visit (HOSPITAL_COMMUNITY): Payer: Self-pay

## 2020-12-05 ENCOUNTER — Ambulatory Visit: Payer: Medicare Other | Admitting: Physician Assistant

## 2020-12-05 ENCOUNTER — Telehealth: Payer: Self-pay | Admitting: Internal Medicine

## 2020-12-05 NOTE — Telephone Encounter (Signed)
Copied from [place] [id number]. Topic: General - Other >> Dec 04, 2020  2:07 PM Valere Dross wrote: Reason for CRM: Pt called in wanting to speak with PCP about finding a eye doctors, please advise.

## 2020-12-05 NOTE — Telephone Encounter (Signed)
Returned pt call and made aware that eye resources will be mailed to her

## 2020-12-14 ENCOUNTER — Emergency Department
Admission: EM | Admit: 2020-12-14 | Discharge: 2020-12-16 | Disposition: A | Discharge status: Home / Self Care | Payer: Medicare Other | Attending: Emergency Medicine | Admitting: Emergency Medicine

## 2020-12-14 ENCOUNTER — Emergency Department (HOSPITAL_COMMUNITY)
Admission: EM | Admit: 2020-12-14 | Discharge: 2020-12-14 | Disposition: A | Discharge status: Home / Self Care | Payer: Medicare Other | Attending: Emergency Medicine | Admitting: Emergency Medicine

## 2020-12-14 ENCOUNTER — Ambulatory Visit: Payer: Self-pay | Admitting: *Deleted

## 2020-12-14 ENCOUNTER — Encounter: Payer: Self-pay | Admitting: Emergency Medicine

## 2020-12-14 ENCOUNTER — Other Ambulatory Visit: Payer: Self-pay

## 2020-12-14 DIAGNOSIS — J449 Chronic obstructive pulmonary disease, unspecified: Secondary | ICD-10-CM | POA: Insufficient documentation

## 2020-12-14 DIAGNOSIS — F1420 Cocaine dependence, uncomplicated: Secondary | ICD-10-CM | POA: Diagnosis not present

## 2020-12-14 DIAGNOSIS — Z853 Personal history of malignant neoplasm of breast: Secondary | ICD-10-CM | POA: Diagnosis not present

## 2020-12-14 DIAGNOSIS — Z5321 Procedure and treatment not carried out due to patient leaving prior to being seen by health care provider: Secondary | ICD-10-CM | POA: Insufficient documentation

## 2020-12-14 DIAGNOSIS — J45909 Unspecified asthma, uncomplicated: Secondary | ICD-10-CM | POA: Insufficient documentation

## 2020-12-14 DIAGNOSIS — Z046 Encounter for general psychiatric examination, requested by authority: Secondary | ICD-10-CM | POA: Insufficient documentation

## 2020-12-14 DIAGNOSIS — F17210 Nicotine dependence, cigarettes, uncomplicated: Secondary | ICD-10-CM | POA: Insufficient documentation

## 2020-12-14 DIAGNOSIS — Z7951 Long term (current) use of inhaled steroids: Secondary | ICD-10-CM | POA: Insufficient documentation

## 2020-12-14 DIAGNOSIS — Z79899 Other long term (current) drug therapy: Secondary | ICD-10-CM | POA: Insufficient documentation

## 2020-12-14 DIAGNOSIS — F29 Unspecified psychosis not due to a substance or known physiological condition: Secondary | ICD-10-CM | POA: Diagnosis not present

## 2020-12-14 DIAGNOSIS — Z96642 Presence of left artificial hip joint: Secondary | ICD-10-CM | POA: Diagnosis not present

## 2020-12-14 LAB — URINE DRUG SCREEN, QUALITATIVE (ARMC ONLY)
Amphetamines, Ur Screen: NOT DETECTED
Barbiturates, Ur Screen: NOT DETECTED
Benzodiazepine, Ur Scrn: NOT DETECTED
Cannabinoid 50 Ng, Ur ~~LOC~~: NOT DETECTED
Cocaine Metabolite,Ur ~~LOC~~: POSITIVE — AB
MDMA (Ecstasy)Ur Screen: NOT DETECTED
Methadone Scn, Ur: NOT DETECTED
Opiate, Ur Screen: NOT DETECTED
Phencyclidine (PCP) Ur S: NOT DETECTED
Tricyclic, Ur Screen: NOT DETECTED

## 2020-12-14 LAB — CBC
HCT: 42.3 % (ref 36.0–46.0)
Hemoglobin: 14.9 g/dL (ref 12.0–15.0)
MCH: 32.5 pg (ref 26.0–34.0)
MCHC: 35.2 g/dL (ref 30.0–36.0)
MCV: 92.2 fL (ref 80.0–100.0)
Platelets: 178 10*3/uL (ref 150–400)
RBC: 4.59 MIL/uL (ref 3.87–5.11)
RDW: 13.1 % (ref 11.5–15.5)
WBC: 5.1 10*3/uL (ref 4.0–10.5)
nRBC: 0 % (ref 0.0–0.2)

## 2020-12-14 LAB — COMPREHENSIVE METABOLIC PANEL
ALT: 18 U/L (ref 0–44)
AST: 23 U/L (ref 15–41)
Albumin: 4.4 g/dL (ref 3.5–5.0)
Alkaline Phosphatase: 66 U/L (ref 38–126)
Anion gap: 9 (ref 5–15)
BUN: 16 mg/dL (ref 8–23)
CO2: 29 mmol/L (ref 22–32)
Calcium: 9.2 mg/dL (ref 8.9–10.3)
Chloride: 100 mmol/L (ref 98–111)
Creatinine, Ser: 0.77 mg/dL (ref 0.44–1.00)
GFR, Estimated: >60 mL/min (ref >60)
Glucose, Bld: 136 mg/dL — ABNORMAL HIGH (ref 70–99)
Potassium: 3.9 mmol/L (ref 3.5–5.1)
Sodium: 138 mmol/L (ref 135–145)
Total Bilirubin: 1.3 mg/dL — ABNORMAL HIGH (ref 0.3–1.2)
Total Protein: 7.2 g/dL (ref 6.5–8.1)

## 2020-12-14 LAB — ETHANOL: Alcohol, Ethyl (B): <10 mg/dL (ref <10)

## 2020-12-14 NOTE — ED Notes (Signed)
One pt's belonging bag contains a light brown jacket, white long sleeve top, gray pants, white grey socks, gray shoes, earrings in plastic cup with hair tight., gray underwear.

## 2020-12-14 NOTE — Telephone Encounter (Signed)
Reason for Disposition  Requesting admission for substance use (drug use), addiction, or withdrawal  Answer Assessment - Initial Assessment Questions 1. DRUG: "What drug(s) are you using?"      Crack cocaine 2. ROUTE: "How are you using this drug?" (e.g., swallow, smoke, inject, huff, snort)     smoking 3. HOW OFTEN: "How many days per week do you typically use it?"     Started again 2 days ago 4. HOW MUCH: "How much do you use?"      1/2 gram/day 5. LAST 24 HOURS: "Have you used it in the last 24 hours?"     no 6. ALCOHOL USE PROBLEM: "Do you have or have you ever had a problem with drinking too much alcohol?"     yes 7. SYMPTOMS: "What symptoms are you currently experiencing?" (e.g., none, headache, chest pain, abdominal pain, vomiting)     no 8. DETOX PROGRAM: "Have you ever gone through a substance use treatment program (detox program)?"     Outpatient treatment 9. THERAPIST: "Do you have a counselor or therapist? Name?"     no 10. SUPPORT: "Who is with you now?" "Who do you live with?" "Do you have family or friends who you can talk to?"        No- patient has lost support due to relapse 11. PREGNANCY: "Is there any chance you are pregnant?"       N/a  Protocols used: Substance Use and Problems-A-AH

## 2020-12-14 NOTE — ED Triage Notes (Signed)
Pt reports her PCP encouraged pt to come to er for PSY Eval. Pt reports emotionally she is not in a good place. Pt reports she has a history of drug abuse, and had a relapse. Denies any SI or HI. Pt talks in complete sentences no respiratory distress noted.

## 2020-12-14 NOTE — Telephone Encounter (Signed)
Patient is calling to report she has had relapse in her sobriety. Patient states she started using crack cocaine again 3 days ago- she states she has lost everything and she is requesting help and detox program. Patient states she has not used drugs within 24 hours. Contacted office- advised patient needs to go to ED- patient advised go to ED for assistance- she will go.

## 2020-12-15 DIAGNOSIS — F1420 Cocaine dependence, uncomplicated: Secondary | ICD-10-CM | POA: Diagnosis not present

## 2020-12-15 MED ORDER — VITAMIN B-12 100 MCG PO TABS
100.0000 ug | ORAL_TABLET | Freq: Every day | ORAL | Status: DC
  Administered 2020-12-15 – 2020-12-16 (×2): 100 ug via ORAL
  Filled 2020-12-15 (×3): qty 1

## 2020-12-15 MED ORDER — MOMETASONE FURO-FORMOTEROL FUM 100-5 MCG/ACT IN AERO
2.0000 | INHALATION_SPRAY | Freq: Two times a day (BID) | RESPIRATORY_TRACT | Status: DC
  Administered 2020-12-15 – 2020-12-16 (×3): 2 via RESPIRATORY_TRACT
  Filled 2020-12-15: qty 8.8

## 2020-12-15 MED ORDER — ALBUTEROL SULFATE HFA 108 (90 BASE) MCG/ACT IN AERS
1.0000 | INHALATION_SPRAY | RESPIRATORY_TRACT | Status: DC | PRN
  Administered 2020-12-15 (×2): 2 via RESPIRATORY_TRACT
  Filled 2020-12-15 (×2): qty 6.7

## 2020-12-15 MED ORDER — ADULT MULTIVITAMIN W/MINERALS CH
1.0000 | ORAL_TABLET | Freq: Every day | ORAL | Status: DC
  Administered 2020-12-15 – 2020-12-16 (×2): 1 via ORAL
  Filled 2020-12-15 (×2): qty 1

## 2020-12-15 MED ORDER — FOLIC ACID 1 MG PO TABS
1.0000 mg | ORAL_TABLET | Freq: Every day | ORAL | Status: DC
  Administered 2020-12-15 – 2020-12-16 (×2): 1 mg via ORAL
  Filled 2020-12-15 (×2): qty 1

## 2020-12-15 MED ORDER — VITAMIN D3 25 MCG (1000 UNIT) PO TABS
3000.0000 [IU] | ORAL_TABLET | Freq: Every day | ORAL | Status: DC
  Administered 2020-12-15 – 2020-12-16 (×2): 3000 [IU] via ORAL
  Filled 2020-12-15 (×4): qty 3

## 2020-12-15 MED ORDER — TAMOXIFEN CITRATE 10 MG PO TABS
10.0000 mg | ORAL_TABLET | Freq: Two times a day (BID) | ORAL | Status: DC
  Administered 2020-12-15 – 2020-12-16 (×3): 10 mg via ORAL
  Filled 2020-12-15 (×5): qty 1

## 2020-12-15 NOTE — ED Notes (Signed)
Hospital meal provided, pt tolerated w/o complaints.  Waste discarded appropriately.  

## 2020-12-15 NOTE — ED Notes (Signed)
Patient given breakfast tray.

## 2020-12-15 NOTE — ED Provider Notes (Signed)
[facility] Emergency Department Provider Note  ____________________________________________   Event Date/Time   First MD Initiated Contact with Patient 12/14/20 2303     (approximate)  I have reviewed the triage vital signs and the nursing notes.   HISTORY  Chief Complaint Psychiatric Evaluation and Addiction Problem    HPI [name] is a 60 y.o. female who presents at the recommendation of her primary care provider for psychiatric evaluation.  She says that she has had issues with addiction to crack cocaine in the past and she has been sober for a long time but over the last month she has relapsed and has used crack multiple times.  She says she does not want to get back into an addictive pattern I feel like she needs help.  She said there is a lot of drama in her life the least to some anxiety and depression.  She has no thoughts of harming herself or anyone else but she knows that she needs help.  She is aware of some outpatient resources but was hoping there might be other options available to her.  Her addiction issues are severe, nothing in particular makes it better or worse.  She denies fever, sore throat, chest pain, shortness of breath, nausea, vomiting, diarrhea, and dysuria.     Past Medical History:  Diagnosis Date   Anemia    during pregnancy   Anxiety    Asthma    Bronchitis age 34   Cancer ([place])    left breast ca   COPD (chronic obstructive pulmonary disease) (HCC)    Depression    Hx of bladder infections    been over 5 years since last one    Rheumatoid arthritis ([place])    unc rheum    Tobacco abuse    Trouble in sleeping     Patient Active Problem List   Diagnosis Date Noted   Malignant neoplasm of upper-outer quadrant of left breast in female, estrogen receptor positive ([place]) 09/15/2019   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   Adenomatous colon polyp 03/16/2019   Degenerative joint disease of  shoulder region 06/08/2018   Rheumatoid arthritis involving both hands with positive rheumatoid factor ([place]) 06/08/2018   Rheumatoid arthritis involving both feet with positive rheumatoid factor ([place]) 06/08/2018   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   Status post total hip replacement, 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   Smoker 06/08/2018   Anxiety and depression 06/08/2018   Family history of breast cancer 03/25/2018   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   Rheumatoid arthritis involving multiple sites ([place]) 01/15/2018   Tobacco dependence 01/15/2018   Opioid use agreement exists 01/15/2018   Positive depression screening 01/15/2018   Homeless 01/15/2018    Past Surgical History:  Procedure Laterality Date   BREAST LUMPECTOMY WITH RADIOACTIVE SEED AND SENTINEL LYMPH NODE BIOPSY Left 08/30/2019   Procedure: LEFT BREAST LUMPECTOMY X 2 WITH RADIOACTIVE SEED AND SENTINEL LYMPH NODE BIOPSY;  Surgeon: [name], MD;  Location: [facility];  Service: General;  Laterality: Left;  PEC BLOCK   COLONOSCOPY WITH PROPOFOL N/A 03/15/2019   Procedure: COLONOSCOPY WITH PROPOFOL;  Surgeon: Jonathon Bellows, MD;  Location[facility] ENDOSCOPY;  Service: Gastroenterology;  Laterality: N/A;   HIP ARTHROPLASTY     JOINT REPLACEMENT     MULTIPLE TOOTH EXTRACTIONS     RE-EXCISION OF BREAST LUMPECTOMY Left 10/07/2019   Procedure: RE-EXCISION OF LEFT BREAST INFERIOR MARGIN;  Surgeon: [name], MD;  Location: [facility];  Service: General;  Laterality: Left;   TOTAL HIP ARTHROPLASTY Left 02/15/2018   Procedure: TOTAL HIP ARTHROPLASTY ANTERIOR APPROACH;  Surgeon: [name], MD;  Location: [facility];  Service: Orthopedics;  Laterality: Left;   WISDOM TOOTH EXTRACTION      Prior to Admission medications   Medication Sig Start Date End Date Taking? Authorizing Provider  albuterol (VENTOLIN HFA) 108 (90 Base) MCG/ACT inhaler TAKE 2 PUFFS BY MOUTH EVERY 6 HOURS AS NEEDED FOR WHEEZE OR SHORTNESS OF BREATH 10/19/20  Yes Ladell Pier, MD  Cholecalciferol (VITAMIN D3) 75 MCG (3000 UT) TABS Take 3,000 Units by mouth daily.   Yes [provider]  cyanocobalamin 100 MCG tablet Take 100 mcg by mouth daily.   Yes [provider]  Etanercept (ENBREL MINI) 50 MG/ML SOCT INJECT 50 MG INTO THE SKIN ONCE A WEEK. 11/27/20 11/27/21 Yes Ofilia Neas, PA-C  folic acid (FOLVITE) 1 MG tablet TAKE 2 TABLETS BY MOUTH ONCE DAILY 02/21/20 02/20/21 Yes Ofilia Neas, PA-C  Multiple Vitamin (MULTIVITAMIN WITH MINERALS) TABS tablet Take 1 tablet by mouth daily.   Yes [provider]  OTREXUP 20 MG/0.4ML SOAJ INJECT 20 MG INTO THE SKIN ONCE A WEEK. 05/23/20  Yes Deveshwar, Abel Presto, MD  SYMBICORT 80-4.5 MCG/ACT inhaler TAKE 2 PUFFS BY MOUTH TWICE A DAY 04/11/20  Yes Ladell Pier, MD  tamoxifen (NOLVADEX) 20 MG tablet TAKE 1 TABLET BY MOUTH ONCE A DAY 11/27/20 11/27/21 Yes Magrinat, Virgie Dad, MD  diazepam (VALIUM) 2 MG tablet Take 1/2 tablet (1 mg total) by mouth before procedure. Patient not taking: No sig reported 08/22/20   Magrinat, Virgie Dad, MD  nicotine (NICODERM CQ - DOSED IN MG/24 HOURS) 14 mg/24hr patch PLACE 1 PATCH (14 MG TOTAL) ONTO THE SKIN DAILY. Patient not taking: Reported on 12/14/2020 08/09/20   Ladell Pier, MD    Allergies Other  Family History  Problem Relation Age of Onset   Breast cancer Mother 54       metastatic   Cervical cancer Mother    Lung cancer Brother        maternal half brother; d. 57s   Lung cancer Maternal Uncle        d. 31s   Lung cancer Maternal Uncle        d. 26   Cancer Maternal Grandmother        unknown type; dx 61s   Healthy Son    Breast cancer Maternal Great-grandmother        dx unknown age    Social History Social History   Tobacco Use   Smoking status: Some Days    Packs/day: 0.25    Types: Cigarettes    Last attempt to quit: 09/08/2019    Years since quitting: 1.2   Smokeless tobacco: Never   Tobacco comments:    on nicotine patches,  rarely smokes  Vaping Use   Vaping Use: Never used  Substance Use Topics   Alcohol use: Yes    Comment: rarely    Drug use: Not Currently    Types: Cocaine    Comment: last used in 2004    Review of Systems Constitutional: No fever/chills Eyes: No visual changes. ENT: No sore throat. Cardiovascular: Denies chest pain. Respiratory: Denies shortness of breath. Gastrointestinal: No abdominal pain.  No nausea, no vomiting.  No diarrhea.  No constipation. Genitourinary: Negative for dysuria. Musculoskeletal: Negative for neck pain.  Negative for back pain. Integumentary:  Negative for rash. Neurological: Negative for headaches, focal weakness or numbness. Psychiatric: Crack cocaine addiction.  Denies SI and HI.  States she has some issues with depression and anxiety.  ____________________________________________   PHYSICAL EXAM:  VITAL SIGNS: ED Triage Vitals  Enc Vitals Group     BP 12/14/20 2143 113/85     Pulse Rate 12/14/20 2143 96     Resp 12/14/20 2143 20     Temp 12/14/20 2143 98.3 F (36.8 C)     Temp Source 12/14/20 2143 Oral     SpO2 12/14/20 2143 95 %     Weight 12/14/20 2147 49 kg (108 lb)     Height 12/14/20 2147 1.651 m (5\' 5" )     Head Circumference --      Peak Flow --      Pain Score 12/14/20 2147 0     Pain Loc --      Pain Edu? --      Excl. in [place]? --     Constitutional: Alert and oriented.  Eyes: Conjunctivae are normal.  Head: Atraumatic. Nose: No congestion/rhinnorhea. Mouth/Throat: Patient is wearing a mask. Neck: No stridor.  No meningeal signs.   Cardiovascular: Normal rate, regular rhythm. Good peripheral circulation. Respiratory: Normal respiratory effort.  No retractions. Gastrointestinal: Soft and nontender. No distention.  Musculoskeletal: No lower extremity tenderness nor edema. No gross deformities of extremities. Neurologic:  Normal speech and language. No gross focal neurologic deficits are appreciated.  Skin:  Skin is warm, dry  and intact. Psychiatric: Mood and affect are normal. Speech and behavior are normal.  Good insight and judgment in terms of recognizing her addiction and seeking help.  No warning signs or symptoms such as suicidal ideation or homicidal ideation.  Not responding to internal stimuli.  ____________________________________________   LABS (all labs ordered are listed, but only abnormal results are displayed)  Labs Reviewed  COMPREHENSIVE METABOLIC PANEL - Abnormal; Notable for the following components:      Result Value   Glucose, Bld 136 (*)    Total Bilirubin 1.3 (*)    All other components within normal limits  URINE DRUG SCREEN, QUALITATIVE ([facility] ONLY) - Abnormal; Notable for the following components:   Cocaine Metabolite,Ur [place] POSITIVE (*)    All other components within normal limits  ETHANOL  CBC   ____________________________________________   INITIAL IMPRESSION / MDM / ASSESSMENT AND PLAN / ED COURSE  As part of my medical decision making, I reviewed the following data within the [place] notes reviewed and incorporated, Labs reviewed , Old chart reviewed, A consult was requested and obtained from this/these consultant(s) Psychiatry, and Notes from prior ED visits   Differential diagnosis includes, but is not limited to, substance abuse, depression, adjustment disorder, mood disorder.  Vital signs are stable and within normal limits.  No indication of an acute medical issue at this time and she is medically cleared for psychiatric disposition based on their recommendations.  At this point I do not feel she meets criteria for involuntary commitment.  I reviewed her lab work and her comprehensive metabolic panel, ethanol level, and CBC are all essentially within normal limits.  Urine drug screen is positive for cocaine as she told us it would be.    Patient is in no distress currently and would like to speak with psychiatry and a consult has been ordered  for psychiatry and TTS. The patient has been placed in psychiatric observation due to the need to  provide a safe environment for the patient while obtaining psychiatric consultation and evaluation, as well as ongoing medical and medication management to treat the patient's condition.  The patient has not been placed under full IVC at this time.       Clinical Course as of 12/15/20 0515  Sat Dec 15, 2020  3154 The patient has been evaluated by psychiatry.  No indication for inpatient treatment. Jamila with TTS is looking for residential treatment options (like [facility] or [facility]). [CF]    Clinical Course User Index [CF] Hinda Kehr, MD     ____________________________________________  FINAL CLINICAL IMPRESSION(S) / ED DIAGNOSES  Final diagnoses:  Cocaine dependence without complication ([place])     MEDICATIONS GIVEN DURING THIS VISIT:  Medications - No data to display   ED Discharge Orders     None        Note:  This document was prepared using Dragon voice recognition software and may include unintentional dictation errors.   Hinda Kehr, MD 12/15/20 [phone]

## 2020-12-15 NOTE — ED Notes (Signed)
Pt asked for toothbrush and toothpaste to brush her teeth before bed. Pt was given toothpaste and toothbrush. NT retrieved those item.

## 2020-12-15 NOTE — ED Notes (Signed)
Patient in the shower

## 2020-12-15 NOTE — ED Notes (Signed)
Pt was given a cup of water and a cup of ice.

## 2020-12-15 NOTE — BH Assessment (Signed)
[facility] [phone]) pending review.   [facility] [phone] -or- [phone]) No female beds available   [facility] ([phone]), information refaxed.   [facility] ([phone]), can not go due to Medicare.  [facility] [phone])

## 2020-12-15 NOTE — BH Assessment (Addendum)
Referral information for Detox treatment faxed to;   [facility] [phone]) Called multiple times, staff currently having trouble with the new phone system, TTS keeps getting placed on hold with no answer from staff  [place] [phone] -or- [phone]) No female beds available  [facility] [phone])  [facility] [phone])

## 2020-12-15 NOTE — ED Notes (Signed)
Pt sitting in recliner in hallway; calm, cooperative, tearful. Pt states "I feel dirty, embarrassed, sad. I just need a shower." Pt c/o of rheumatoid arthritis pain, which she rates 5/10 on 0-10 pain scale. Pt states that she had her weekly shots for arthritic pain before coming to hospital. Pt denies SI/HI/AVH at this time. Pt states "I haven't slept very well for a while" and describes her appetite as fair. Pt reports having anxiety and feeling "really sad and depressed." Pt states "I messed everything up. I shouldn't have done what I done."

## 2020-12-15 NOTE — BH Assessment (Addendum)
[facility] [phone]) No beds available tonight, staff reports to try back in the morning 12/16/20   [facility] [phone] -or- [phone]) No female beds available   [facility] ([phone]), information refaxed again at 1:23am 12/16/20   [facility] ([phone]), can not go due to Medicare.   [facility] [phone]) No answer

## 2020-12-15 NOTE — ED Notes (Addendum)
Assumed care; pt report received from Arcelia Jew, RN

## 2020-12-15 NOTE — BH Assessment (Signed)
Comprehensive Clinical Assessment (CCA) Note  12/15/2020 [name] [id number]  Chief Complaint: Patient is a 60 year old female presenting to [facility] ED voluntarily seeking assistance for her substance use. Per triage note Pt reports her PCP encouraged pt to come to er for PSY Eval. Pt reports emotionally she is not in a good place. Pt reports she has a history of drug abuse, and had a relapse. Denies any SI or HI. Pt talks in complete sentences no respiratory distress noted. During assessment patient appears alert and oriented x4, calm and cooperative, mood appears depressed. Patient reports "I wanted to seek some treatment, I have a substance abuse problem, and a couple of months ago I started to feel depressed." Patient reports that she has been using Cocaine via smoking and reports using 1/2 gram. Patient reports having a recent relapse " a couple of months ago." Patient denies any current withdrawal symptoms. Patient currently denies SI/HI/AH/VH and does not appear to be responding to any internal or external stimuli.  Patient is recommended for detox treatment Chief Complaint  Patient presents with   Psychiatric Evaluation   Addiction Problem   Visit Diagnosis: Cocaine Abuse    CCA Screening, Triage and Referral (STR)  Patient Reported Information How did you hear about [place]? Self  Referral name: No data recorded Referral phone number: No data recorded  Whom do you see for routine medical problems? No data recorded Practice/Facility Name: No data recorded Practice/Facility Phone Number: No data recorded Name of Contact: No data recorded Contact Number: No data recorded Contact Fax Number: No data recorded Prescriber Name: No data recorded Prescriber Address (if known): No data recorded  What Is the Reason for Your Visit/Call Today? Patient presents voluntarily requesting assistance for her substance abuse  How Long Has This Been Causing You Problems? > than 6 months  What Do  You Feel Would Help You the Most Today? Alcohol or Drug Use Treatment   Have You Recently Been in Any Inpatient Treatment (Hospital/Detox/Crisis Center/28-Day Program)? No data recorded Name/Location of Program/Hospital:No data recorded How Long Were You There? No data recorded When Were You Discharged? No data recorded  Have You Ever Received Services From [facility] Before? No data recorded Who Do You See at [facility]? No data recorded  Have You Recently Had Any Thoughts About Hurting Yourself? No  Are You Planning to Commit Suicide/Harm Yourself At This time? No   Have you Recently Had Thoughts About [place]? No  Explanation: No data recorded  Have You Used Any Alcohol or Drugs in the Past 24 Hours? Yes  How Long Ago Did You Use Drugs or Alcohol? No data recorded What Did You Use and How Much? Cocaine   Do You Currently Have a Therapist/Psychiatrist? No  Name of Therapist/Psychiatrist: No data recorded  Have You Been Recently Discharged From Any Office Practice or Programs? No  Explanation of Discharge From Practice/Program: No data recorded    CCA Screening Triage Referral Assessment Type of Contact: Face-to-Face  Is this Initial or Reassessment? No data recorded Date Telepsych consult ordered in [facility]:  No data recorded Time Telepsych consult ordered in [facility]:  No data recorded  Patient Reported Information Reviewed? No data recorded Patient Left Without Being Seen? No data recorded Reason for Not Completing Assessment: No data recorded  Collateral Involvement: No data recorded  Does Patient Have a [place]? No data recorded Name and Contact of Legal Guardian: No data recorded If Minor and  Not Living with Parent(s), Who has Custody? No data recorded Is CPS involved or ever been involved? Never  Is APS involved or ever been involved? Never   Patient Determined To Be At Risk for Harm To Self or Others Based on Review of  Patient Reported Information or Presenting Complaint? No  Method: No data recorded Availability of Means: No data recorded Intent: No data recorded Notification Required: No data recorded Additional Information for Danger to Others Potential: No data recorded Additional Comments for Danger to Others Potential: No data recorded Are There Guns or Other Weapons in Your Home? No data recorded Types of Guns/Weapons: No data recorded Are These Weapons Safely Secured?                            No data recorded Who Could Verify You Are Able To Have These Secured: No data recorded Do You Have any Outstanding Charges, Pending Court Dates, Parole/Probation? No data recorded Contacted To Inform of Risk of Harm To Self or Others: No data recorded  Location of Assessment: [facility] ED   Does Patient Present under Involuntary Commitment? No  IVC Papers Initial File Date: No data recorded  [place] of Residence: [place]   Patient Currently Receiving the Following Services: No data recorded  Determination of Need: Emergent (2 hours)   Options For Referral: No data recorded    CCA Biopsychosocial Intake/Chief Complaint:  No data recorded Current Symptoms/Problems: No data recorded  Patient Reported Schizophrenia/Schizoaffective Diagnosis in Past: No   Strengths: Patient is able to communicate her needs  Preferences: No data recorded Abilities: No data recorded  Type of Services Patient Feels are Needed: No data recorded  Initial Clinical Notes/Concerns: No data recorded  Mental Health Symptoms Depression:   Change in energy/activity; Worthlessness   Duration of Depressive symptoms:  Greater than two weeks   Mania:   None   Anxiety:    None   Psychosis:   None   Duration of Psychotic symptoms: No data recorded  Trauma:   None   Obsessions:   None   Compulsions:   None   Inattention:   None   Hyperactivity/Impulsivity:   None   Oppositional/Defiant Behaviors:    None   Emotional Irregularity:   None   Other Mood/Personality Symptoms:  No data recorded   Mental Status Exam Appearance and self-care  Stature:   Average   Weight:   Average weight   Clothing:   Casual   Grooming:   Normal   Cosmetic use:   None   Posture/gait:   Normal   Motor activity:   Not Remarkable   Sensorium  Attention:   Normal   Concentration:   Normal   Orientation:   X5   Recall/memory:   Normal   Affect and Mood  Affect:   Appropriate   Mood:   Depressed   Relating  Eye contact:   Normal   Facial expression:   Depressed   Attitude toward examiner:   Cooperative   Thought and Language  Speech flow:  Clear and Coherent   Thought content:   Appropriate to Mood and Circumstances   Preoccupation:   None   Hallucinations:   None   Organization:  No data recorded  [employer] of Knowledge:   Fair   Intelligence:   Average   Abstraction:   Normal   Judgement:   Fair   Art therapist:  Realistic   Insight:   Fair   Decision Making:   Normal   Social Functioning  Social Maturity:   Responsible   Social Judgement:   Normal   Stress  Stressors:   Other (Comment)   Coping Ability:   Normal   Skill Deficits:   None   Supports:   Family     Religion: Religion/Spirituality Are You A Religious Person?: No  Leisure/Recreation: Leisure / Recreation Do You Have Hobbies?: No  Exercise/Diet: Exercise/Diet Do You Exercise?: No Have You Gained or Lost A Significant Amount of Weight in the Past Six Months?: No Do You Follow a Special Diet?: No Do You Have Any Trouble Sleeping?: No   CCA Employment/Education Employment/Work Situation: Employment / Work Situation Employment Situation:  (Unknown) Has Patient ever Been in Passenger transport manager?: No  Education: Education Did Physicist, medical?: No Did You Have An Individualized Education Program (IIEP): No Did You Have Any  Difficulty At [employer]?: No Patient's Education Has Been Impacted by Current Illness: No   CCA Family/Childhood History Family and Relationship History: Family history Marital status: Single Does patient have children?:  (Unknown)  Childhood History:  Childhood History Did patient suffer any verbal/emotional/physical/sexual abuse as a child?: No Did patient suffer from severe childhood neglect?: No Has patient ever been sexually abused/assaulted/raped as an adolescent or adult?: No Was the patient ever a victim of a crime or a disaster?: No Witnessed domestic violence?: No Has patient been affected by domestic violence as an adult?: No  Child/Adolescent Assessment:     CCA Substance Use Alcohol/Drug Use: Alcohol / Drug Use Pain Medications: See MAR Prescriptions: See MAR Over the Counter: See MAR History of alcohol / drug use?: Yes Substance #1 Name of Substance 1: Cocaine 1 - Amount (size/oz): 1/2 gram 1 - Frequency: "Every 2 days" 1- Route of Use: Smoking                       ASAM's:  Six Dimensions of Multidimensional Assessment  Dimension 1:  Acute Intoxication and/or Withdrawal Potential:      Dimension 2:  Biomedical Conditions and Complications:      Dimension 3:  Emotional, Behavioral, or Cognitive Conditions and Complications:     Dimension 4:  Readiness to Change:     Dimension 5:  Relapse, Continued use, or Continued Problem Potential:     Dimension 6:  Recovery/Living Environment:     ASAM Severity Score:    ASAM Recommended Level of Treatment: ASAM Recommended Level of Treatment: Level III Residential Treatment   Substance use Disorder (SUD) Substance Use Disorder (SUD)  Checklist Symptoms of Substance Use: Continued use despite having a persistent/recurrent physical/psychological problem caused/exacerbated by use, Continued use despite persistent or recurrent social, interpersonal problems, caused or exacerbated by use, Evidence of tolerance,  Persistent desire or unsuccessful efforts to cut down or control use, Presence of craving or strong urge to use, Social, occupational, recreational activities given up or reduced due to use  Recommendations for Services/Supports/Treatments: Recommendations for Services/Supports/Treatments Recommendations For Services/Supports/Treatments: Detox  DSM5 Diagnoses: Patient Active Problem List   Diagnosis Date Noted   Malignant neoplasm of upper-outer quadrant of left breast in female, estrogen receptor positive ([place]) 09/15/2019   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   Adenomatous colon polyp 03/16/2019   Degenerative joint disease of shoulder region 06/08/2018   Rheumatoid arthritis involving both hands with positive rheumatoid factor ([place]) 06/08/2018   Rheumatoid arthritis involving  both feet with positive rheumatoid factor ([place]) 06/08/2018   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   Status post total hip replacement, 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   Smoker 06/08/2018   Anxiety and depression 06/08/2018   Family history of breast cancer 03/25/2018   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   Rheumatoid arthritis involving multiple sites ([place]) 01/15/2018   Tobacco dependence 01/15/2018   Opioid use agreement exists 01/15/2018   Positive depression screening 01/15/2018   Homeless 01/15/2018    Patient Centered Plan: Patient is on the following Treatment Plan(s):  Substance Abuse   Referrals to Alternative Service(s): Referred to Alternative Service(s):   Place:   Date:   Time:    Referred to Alternative Service(s):   Place:   Date:   Time:    Referred to Alternative Service(s):   Place:   Date:   Time:    Referred to Alternative Service(s):   Place:   Date:   Time:     Erynn Vaca A Anik Wesch, LCAS-A

## 2020-12-15 NOTE — ED Notes (Signed)
NT disposed of pt trash. NT poured water in the sink in the room and water is leaking from the bottom of the sink.

## 2020-12-16 DIAGNOSIS — F1420 Cocaine dependence, uncomplicated: Secondary | ICD-10-CM | POA: Diagnosis not present

## 2020-12-16 NOTE — ED Notes (Signed)
Assumed care; pt report received from [place], [place].

## 2020-12-16 NOTE — Discharge Instructions (Signed)
Please follow up with the outpatient resources provided.

## 2020-12-16 NOTE — ED Notes (Signed)
Pt given lunch tray and water per request.

## 2020-12-16 NOTE — ED Notes (Signed)
Pt given breakfast tray

## 2020-12-16 NOTE — ED Notes (Signed)
VOL/pending placement 

## 2020-12-16 NOTE — BH Assessment (Signed)
Writer followed up with referrals  [phone]) No beds available tonight, staff reports to try back in the morning 12/16/20   [facility] [phone] -or- [phone]), Declined due to lack acuity.   [facility] ([place]one-[phone]), took the patient's information and stated she would have to return the phone call.   [facility] ([phone]), can not go due to Medicare.   [facility] [phone]) No answer

## 2020-12-16 NOTE — ED Notes (Signed)
Pt reviewed AVS and gave to patient.  Patient received all personal belongings.  Patient denies SI/HI/AVH and verbalized understanding of discharge plan.  Patient discharge to lobby where she will drive herself safely.

## 2020-12-16 NOTE — BH Assessment (Signed)
Per the request of the patient, writer spoke with her about outpatient options for SA Treatment. Patient denies SI/HI and AV/H.

## 2020-12-18 ENCOUNTER — Other Ambulatory Visit: Payer: Self-pay

## 2020-12-18 ENCOUNTER — Ambulatory Visit: Payer: Medicare Other | Attending: Internal Medicine | Admitting: Internal Medicine

## 2020-12-18 ENCOUNTER — Encounter: Payer: Self-pay | Admitting: Internal Medicine

## 2020-12-18 VITALS — BP 108/73 | HR 88 | Resp 16 | Wt 109.6 lb

## 2020-12-18 DIAGNOSIS — M069 Rheumatoid arthritis, unspecified: Secondary | ICD-10-CM | POA: Diagnosis not present

## 2020-12-18 DIAGNOSIS — Z853 Personal history of malignant neoplasm of breast: Secondary | ICD-10-CM | POA: Diagnosis not present

## 2020-12-18 DIAGNOSIS — F321 Major depressive disorder, single episode, moderate: Secondary | ICD-10-CM | POA: Diagnosis not present

## 2020-12-18 DIAGNOSIS — Z7951 Long term (current) use of inhaled steroids: Secondary | ICD-10-CM | POA: Diagnosis not present

## 2020-12-18 DIAGNOSIS — Z9109 Other allergy status, other than to drugs and biological substances: Secondary | ICD-10-CM | POA: Insufficient documentation

## 2020-12-18 DIAGNOSIS — F17200 Nicotine dependence, unspecified, uncomplicated: Secondary | ICD-10-CM | POA: Diagnosis not present

## 2020-12-18 DIAGNOSIS — N393 Stress incontinence (female) (male): Secondary | ICD-10-CM | POA: Diagnosis not present

## 2020-12-18 DIAGNOSIS — F1410 Cocaine abuse, uncomplicated: Secondary | ICD-10-CM | POA: Diagnosis present

## 2020-12-18 DIAGNOSIS — F17210 Nicotine dependence, cigarettes, uncomplicated: Secondary | ICD-10-CM | POA: Insufficient documentation

## 2020-12-18 DIAGNOSIS — F411 Generalized anxiety disorder: Secondary | ICD-10-CM | POA: Diagnosis not present

## 2020-12-18 DIAGNOSIS — Z23 Encounter for immunization: Secondary | ICD-10-CM | POA: Diagnosis not present

## 2020-12-18 DIAGNOSIS — Z79899 Other long term (current) drug therapy: Secondary | ICD-10-CM | POA: Diagnosis not present

## 2020-12-18 DIAGNOSIS — Z803 Family history of malignant neoplasm of breast: Secondary | ICD-10-CM | POA: Diagnosis not present

## 2020-12-18 DIAGNOSIS — Z96642 Presence of left artificial hip joint: Secondary | ICD-10-CM | POA: Diagnosis not present

## 2020-12-18 MED ORDER — ESCITALOPRAM OXALATE 10 MG PO TABS: ORAL_TABLET | ORAL | 1 refills | Status: DC

## 2020-12-18 NOTE — Progress Notes (Signed)
Patient ID: [name], female    DOB: 09-Nov-1960  MRN: [id number]  CC: Hospitalization Follow-up (ED)   Subjective: [name] is a 60 y.o. female who presents for hosp f/u.  Last eval by me 03/2019 Her concerns today include:  Patient with history of rheumatoid arthritis, arthritis RT shoulder, tob dep, THR, anxiety/depression, stress incontinence.   Had recent relapse with cocaine use after being clean for quite a number of years.  She went to the hospital seeking help and was admitted to the psych ward for 2 days last week.  She was not discharged on any psychiatric medications. Will doing counseling with a minister Clyda Greener); will meet him tomorrow.  Also plans to do get est with a behavioral health group in [place] who does virtual appts.  -Positive depression and GAD7 screen today.  Reports she has a lot going on.  No thoughts of wanting to hurt self. Never on antidepressant but feels she would benefit from 1. -just join the [facility]  Had relapse of cigarette smoking at the time that she relapsed with cocaine.  However she states that she is back using the nicotine patches again trying to quit.  HM:  agrees to flu shot today.  Had COVID 2 shots.  Plans to get booster.  Patient Active Problem List   Diagnosis Date Noted   Malignant neoplasm of upper-outer quadrant of left breast in female, estrogen receptor positive ([place]) 09/15/2019   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   Adenomatous colon polyp 03/16/2019   Degenerative joint disease of shoulder region 06/08/2018   Rheumatoid arthritis involving both hands with positive rheumatoid factor ([place]) 06/08/2018   Rheumatoid arthritis involving both feet with positive rheumatoid factor ([place]) 06/08/2018   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   Status post total hip replacement, 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   Smoker 06/08/2018   Anxiety and depression 06/08/2018   Family history of breast cancer 03/25/2018   Protrusio  acetabuli 02/11/2018   Rheumatoid arthritis involving multiple sites ([place]) 01/15/2018   Tobacco dependence 01/15/2018   Opioid use agreement exists 01/15/2018   Positive depression screening 01/15/2018   Homeless 01/15/2018     Current Outpatient Medications on File Prior to Visit  Medication Sig Dispense Refill   albuterol (VENTOLIN HFA) 108 (90 Base) MCG/ACT inhaler TAKE 2 PUFFS BY MOUTH EVERY 6 HOURS AS NEEDED FOR WHEEZE OR SHORTNESS OF BREATH 18 each 1   Cholecalciferol (VITAMIN D3) 75 MCG (3000 UT) TABS Take 3,000 Units by mouth daily.     cyanocobalamin 100 MCG tablet Take 100 mcg by mouth daily.     diazepam (VALIUM) 2 MG tablet Take 1/2 tablet (1 mg total) by mouth before procedure. (Patient not taking: No sig reported) 1 tablet 0   Etanercept (ENBREL MINI) 50 MG/ML SOCT INJECT 50 MG INTO THE SKIN ONCE A WEEK. 12 mL 0   folic acid (FOLVITE) 1 MG tablet TAKE 2 TABLETS BY MOUTH ONCE DAILY 180 tablet 3   Multiple Vitamin (MULTIVITAMIN WITH MINERALS) TABS tablet Take 1 tablet by mouth daily.     nicotine (NICODERM CQ - DOSED IN MG/24 HOURS) 14 mg/24hr patch PLACE 1 PATCH (14 MG TOTAL) ONTO THE SKIN DAILY. (Patient not taking: Reported on 12/14/2020) 28 patch 1   OTREXUP 20 MG/0.4ML SOAJ INJECT 20 MG INTO THE SKIN ONCE A WEEK. 4.8 mL 0   SYMBICORT 80-4.5 MCG/ACT inhaler TAKE 2 PUFFS BY MOUTH TWICE A DAY 30.6 each 0  tamoxifen (NOLVADEX) 20 MG tablet TAKE 1 TABLET BY MOUTH ONCE A DAY 90 tablet 3   No current facility-administered medications on file prior to visit.    Allergies  Allergen Reactions   Other Rash    Unknown cleaner used in OR    Social History   Socioeconomic History   Marital status: Single    Spouse name: Not on file   Number of children: 1   Years of education: Not on file   Highest education level: Not on file  Occupational History   Not on file  Tobacco Use   Smoking status: Some Days    Packs/day: 0.25    Types: Cigarettes    Last attempt to quit:  09/08/2019    Years since quitting: 1.2   Smokeless tobacco: Never   Tobacco comments:    on nicotine patches, rarely smokes  Vaping Use   Vaping Use: Never used  Substance and Sexual Activity   Alcohol use: Yes    Comment: rarely    Drug use: Not Currently    Types: Cocaine    Comment: last used in 2004   Sexual activity: Not on file  Other Topics Concern   Not on file  Social History Narrative   Not on file   Social Determinants of Health   Financial Resource Strain: Not on file  Food Insecurity: Not on file  Transportation Needs: Not on file  Physical Activity: Not on file  Stress: Not on file  Social Connections: Not on file  Intimate Partner Violence: Not on file    Family History  Problem Relation Age of Onset   Breast cancer Mother 80       metastatic   Cervical cancer Mother    Lung cancer Brother        maternal half brother; d. 21s   Lung cancer Maternal Uncle        d. 70s   Lung cancer Maternal Uncle        d. 85   Cancer Maternal Grandmother        unknown type; dx 44s   Healthy Son    Breast cancer Maternal Great-grandmother        dx unknown age    Past Surgical History:  Procedure Laterality Date   BREAST LUMPECTOMY WITH RADIOACTIVE SEED AND SENTINEL LYMPH NODE BIOPSY Left 08/30/2019   Procedure: LEFT BREAST LUMPECTOMY X 2 WITH RADIOACTIVE SEED AND SENTINEL LYMPH NODE BIOPSY;  Surgeon: [name], MD;  Location: [place];  Service: General;  Laterality: Left;  PEC BLOCK   COLONOSCOPY WITH PROPOFOL N/A 03/15/2019   Procedure: COLONOSCOPY WITH PROPOFOL;  Surgeon: Jonathon Bellows, MD;  Location: [facility];  Service: Gastroenterology;  Laterality: N/A;   HIP ARTHROPLASTY     JOINT REPLACEMENT     MULTIPLE TOOTH EXTRACTIONS     RE-EXCISION OF BREAST LUMPECTOMY Left 10/07/2019   Procedure: RE-EXCISION OF LEFT BREAST INFERIOR MARGIN;  Surgeon: [name], MD;  Location: [place];  Service: General;  Laterality: Left;   TOTAL HIP  ARTHROPLASTY Left 02/15/2018   Procedure: TOTAL HIP ARTHROPLASTY ANTERIOR APPROACH;  Surgeon: [name], MD;  Location: [facility];  Service: Orthopedics;  Laterality: Left;   WISDOM TOOTH EXTRACTION      ROS: Review of Systems Negative except as stated above  PHYSICAL EXAM: BP 108/73   Pulse 88   Resp 16   Wt 109 lb 9.6 oz (49.7 kg)   SpO2 95%  BMI 18.24 kg/m   Physical Exam  General appearance - alert, well appearing, older Caucasian female and in no distress Mental status - normal mood, behavior, speech, dress, motor activity, and thought processes Chest - clear to auscultation, no wheezes, rales or rhonchi, symmetric air entry Heart - normal rate, regular rhythm, normal S1, S2, no murmurs, rubs, clicks or gallops Extremities - peripheral pulses normal, no pedal edema, no clubbing or cyanosis  Depression screen [facility] 2/9 12/18/2020 04/01/2019 11/02/2018  Decreased Interest 2 0 1  Down, Depressed, Hopeless 3 0 1  PHQ - 2 Score 5 0 2  Altered sleeping 3 - 2  Tired, decreased energy 3 - 1  Change in appetite 1 - 0  Feeling bad or failure about yourself  3 - 1  Trouble concentrating 2 - 0  Moving slowly or fidgety/restless 3 - 1  Suicidal thoughts 0 - 0  PHQ-9 Score 20 - 7  Some recent data might be hidden   GAD 7 : Generalized Anxiety Score 12/18/2020 04/01/2019 06/22/2018 03/25/2018  Nervous, Anxious, on Edge 2 0 2 2  Control/stop worrying 3 0 0 2  Worry too much - different things 3 0 1 2  Trouble relaxing 2 0 1 2  Restless 2 0 0 2  Easily annoyed or irritable 1 0 0 1  Afraid - awful might happen 0 0 0 0  Total GAD 7 Score 13 0 4 11       CMP Latest Ref Rng & Units 12/14/2020 10/24/2020 07/30/2020  Glucose 70 - 99 mg/dL 136(H) 76 125(H)  BUN 8 - 23 mg/dL 16 16 13   Creatinine 0.44 - 1.00 mg/dL 0.77 0.84 0.74  Sodium 135 - 145 mmol/L 138 143 140  Potassium 3.5 - 5.1 mmol/L 3.9 4.5 4.3  Chloride 98 - 111 mmol/L 100 105 99  CO2 22 - 32 mmol/L 29 28 33(H)  Calcium 8.9 -  10.3 mg/dL 9.2 9.5 9.6  Total Protein 6.5 - 8.1 g/dL 7.2 7.0 6.8  Total Bilirubin 0.3 - 1.2 mg/dL 1.3(H) 0.8 0.5  Alkaline Phos 38 - 126 U/L 66 78 -  AST 15 - 41 U/L 23 18 14   ALT 0 - 44 U/L 18 19 13    Lipid Panel     Component Value Date/Time   CHOL 165 01/15/2018 1509   TRIG 103 01/15/2018 1509   HDL 77 01/15/2018 1509   CHOLHDL 2.1 01/15/2018 1509   LDLCALC 67 01/15/2018 1509    CBC    Component Value Date/Time   WBC 5.1 12/14/2020 2154   RBC 4.59 12/14/2020 2154   HGB 14.9 12/14/2020 2154   HGB 14.5 10/24/2020 1210   HGB 12.8 01/15/2018 1509   HCT 42.3 12/14/2020 2154   HCT 38.3 01/15/2018 1509   PLT 178 12/14/2020 2154   PLT 167 10/24/2020 1210   PLT 311 01/15/2018 1509   MCV 92.2 12/14/2020 2154   MCV 86 01/15/2018 1509   MCH 32.5 12/14/2020 2154   MCHC 35.2 12/14/2020 2154   RDW 13.1 12/14/2020 2154   RDW 14.2 01/15/2018 1509   LYMPHSABS 1.5 10/24/2020 1210   MONOABS 0.4 10/24/2020 1210   EOSABS 0.1 10/24/2020 1210   BASOSABS 0.0 10/24/2020 1210    ASSESSMENT AND PLAN: 1. Cocaine substance abuse ([place]) Commended her on seeking help.  Besides counseling and getting in with behavioral health, I recommend she consider going to NA meetings.  2. Tobacco dependence Advised to quit.  She has started using nicotine  patches again to try to help her quit.  We will reassess progress on next visit.  3. Major depressive disorder, single episode, moderate ([place]) Discuss starting medication to help with depression and anxiety.  Patient feels she would benefit from medication at this time.  Denies suicidal ideation.  We cannot use SSRI as it affects the level of tamoxifen so we decided on an SNRI Lexapro.  Advised patient to take the medication in the mornings.  We will start at a low dose and gradually increase depending on response.  If any worsening depression or anxiety on the medication, she should stop the medicine and let me know. Message sent to our LCSW to follow-up  with her just to make sure that she follows through with getting established with behavioral health. - escitalopram (LEXAPRO) 10 MG tablet; 1/2 tab PO daily x 4 wks then 1 tab daily  Dispense: 30 tablet; Refill: 1  4. GAD (generalized anxiety disorder) See #3 above. - escitalopram (LEXAPRO) 10 MG tablet; 1/2 tab PO daily x 4 wks then 1 tab daily  Dispense: 30 tablet; Refill: 1  5. Need for immunization against influenza - Flu Vaccine QUAD 10mo+IM (Fluarix, Fluzone & Alfiuria Quad PF)    Patient was given the opportunity to ask questions.  Patient verbalized understanding of the plan and was able to repeat key elements of the plan.   Orders Placed This Encounter  Procedures   Flu Vaccine QUAD 43mo+IM (Fluarix, Fluzone & Alfiuria Quad PF)     Requested Prescriptions   Signed Prescriptions Disp Refills   escitalopram (LEXAPRO) 10 MG tablet 30 tablet 1    Sig: 1/2 tab PO daily x 4 wks then 1 tab daily    Return in about 6 weeks (around 01/29/2021).  Karle Plumber, MD, FACP

## 2020-12-20 ENCOUNTER — Telehealth: Payer: Self-pay | Admitting: *Deleted

## 2020-12-20 NOTE — Telephone Encounter (Signed)
Copied from [place] [id number]. Topic: General - Other >> Dec 19, 2020 12:22 PM Celene Kras wrote: Reason for CRM: Pt called and is requesting to have a handicap placard form filled out for her. She states that her shoulder causes her a lot of pain and this would help with that. Please advise.

## 2020-12-21 NOTE — Telephone Encounter (Signed)
Will forward to provider  

## 2020-12-21 NOTE — Telephone Encounter (Signed)
Returned pt call and provided provider response pt doesn't have any questions or concerns

## 2020-12-24 ENCOUNTER — Other Ambulatory Visit (HOSPITAL_COMMUNITY): Payer: Self-pay

## 2020-12-26 ENCOUNTER — Other Ambulatory Visit (HOSPITAL_COMMUNITY): Payer: Self-pay

## 2020-12-28 ENCOUNTER — Telehealth: Payer: Self-pay | Admitting: Internal Medicine

## 2020-12-28 MED ORDER — ALBUTEROL SULFATE HFA 108 (90 BASE) MCG/ACT IN AERS: INHALATION_SPRAY | RESPIRATORY_TRACT | 2 refills | Status: DC

## 2020-12-28 NOTE — Telephone Encounter (Signed)
Copied from [place] [id number]. Topic: Quick Communication - Rx Refill/Question >> Dec 28, 2020 11:52 AM Leward Quan A wrote: Medication: albuterol (VENTOLIN HFA) 108 (90 Base) MCG/ACT inhaler, ibuprofen (ADVIL,MOTRIN) 600 MG tablet   Has the patient contacted their pharmacy? No. Will call but had not called yet  (Agent: If no, request that the patient contact the pharmacy for the refill.) (Agent: If yes, when and what did the pharmacy advise?)  Preferred Pharmacy (with phone number or street name[id number] [place] [place]  Phone:  [phone] Fax:  [phone]    Has the patient been seen for an appointment in the last year OR does the patient have an upcoming appointment? Yes.    Agent: Please be advised that RX refills may take up to 3 business days. We ask that you follow-up with your pharmacy.

## 2020-12-28 NOTE — Telephone Encounter (Signed)
Requested Prescriptions  Pending Prescriptions Disp Refills  . albuterol (VENTOLIN HFA) 108 (90 Base) MCG/ACT inhaler 18 each 2    Sig: TAKE 2 PUFFS BY MOUTH EVERY 6 HOURS AS NEEDED FOR WHEEZE OR SHORTNESS OF BREATH     Pulmonology:  Beta Agonists Failed - 12/28/2020 12:34 PM      Failed - One inhaler should last at least one month. If the patient is requesting refills earlier, contact the patient to check for uncontrolled symptoms.      Passed - Valid encounter within last 12 months    Recent Outpatient Visits          1 week ago Cocaine substance abuse [facility])   [place] Ladell Pier, MD   1 year ago Moderate persistent reactive airway disease without complication   [place] Ladell Pier, MD   1 year ago Other bursal cyst, right elbow   [place], MD   2 years ago Arthritis of shoulder region, right   [place], Deborah B, MD   2 years ago Tobacco dependence   [place], MD      Future Appointments            In 1 month Wynetta Emery, [name], MD [place]   In 3 months Bo Merino, MD [facility]   In 5 months [place], Ronalee Red, [facility], [facility]

## 2020-12-28 NOTE — Telephone Encounter (Signed)
Patient is requesting new refill for Ibuprofen 600 mg to be sent to pharmacy. Unable to reorder due to asking for diagnosis to be selected. Routing to provider for approval.  [id number] [place], [place] [address] Phone:  [phone]  Fax:  [phone]

## 2021-01-01 ENCOUNTER — Other Ambulatory Visit (HOSPITAL_COMMUNITY): Payer: Self-pay

## 2021-01-01 NOTE — Telephone Encounter (Signed)
No entry 

## 2021-01-04 ENCOUNTER — Telehealth: Payer: Self-pay | Admitting: Internal Medicine

## 2021-01-04 ENCOUNTER — Telehealth: Payer: Self-pay | Admitting: Clinical

## 2021-01-04 NOTE — Telephone Encounter (Signed)
Copied from [place] [phone]. Topic: General - Other >> Jan 02, 2021  9:11 AM [place] Lerner A wrote: Reason for CRM: The patient would like to be contacted by a member of staff  The patient has been told by the pharmacy that their prescription for  ibuprofen (ADVIL,MOTRIN) 600 MG tablet  has been denied by their PCP   The patient would like to discuss their denial when available

## 2021-01-04 NOTE — Telephone Encounter (Signed)
I spoke with pt per PCP request. Pt mentioned that she continues to receive counseling from a minister. She mentioned that she attends the [facility] 3-4 days/week and participates in water aerobics. Pt mentioned that she was unable to connect with [facility] in [place] due to time conflict. She mentioned that she is feeling better and believes going to the [facility] and counseling are helping. Pt mentioned that she did not pick up Lexapro due to feeling better and not wanting to take a medication.

## 2021-01-04 NOTE — Telephone Encounter (Signed)
Patient called in to request that Dr Wynetta Emery please sign off on the ibuprofen (ADVIL,MOTRIN) 800 MG tablet say she is back working and is in some pain

## 2021-01-06 ENCOUNTER — Other Ambulatory Visit: Payer: Self-pay | Admitting: Internal Medicine

## 2021-01-06 DIAGNOSIS — F17200 Nicotine dependence, unspecified, uncomplicated: Secondary | ICD-10-CM

## 2021-01-06 NOTE — Telephone Encounter (Signed)
Requested Prescriptions  Pending Prescriptions Disp Refills  . nicotine (NICODERM CQ - DOSED IN MG/24 HOURS) 14 mg/24hr patch [Pharmacy Med Name: NICOTINE 14 MG/24HR PATCH] 28 patch 1    Sig: PLACE 1 PATCH ONTO THE SKIN DAILY.     Psychiatry:  Drug Dependence Therapy Passed - 01/06/2021  1:17 PM      Passed - Valid encounter within last 12 months    Recent Outpatient Visits          2 weeks ago Cocaine substance abuse [facility])   [place] Ladell Pier, MD   1 year ago Moderate persistent reactive airway disease without complication   [place], MD   1 year ago Other bursal cyst, right elbow   [place], MD   2 years ago Arthritis of shoulder region, right   [place], Deborah B, MD   2 years ago Tobacco dependence   [place], MD      Future Appointments            In 1 month Wynetta Emery, [name], MD [place]   In 3 months Bo Merino, MD [facility]   In 4 months [place], Ronalee Red, [facility], [facility]

## 2021-01-07 ENCOUNTER — Telehealth: Payer: Self-pay

## 2021-01-07 ENCOUNTER — Ambulatory Visit: Payer: Self-pay | Admitting: *Deleted

## 2021-01-07 NOTE — Telephone Encounter (Signed)
Pt called stating that she is needing to have suggestions for medications that help with her pain. She states that she cannot have ibuprofen due to her current medications. Please advise.   Pt reports was prescribed IBU. States had forgotten she cannot take with Methotrexate. Was advised to call rheumatologist, referred back to Dr. Wynetta Emery. Pt states increased pain "All over but mostly shoulder" last week, "Better this week, just would like something available if needed."  Please advise: [phone]     Reason for Disposition  Prescription request for new medicine (not a refill)  Answer Assessment - Initial Assessment Questions 1. NAME of MEDICATION: "What medicine are you calling about?"     Appropriate pain med. 2. QUESTION: "What is your question?" (e.g., double dose of medicine, side effect)     *No Answer* 3. PRESCRIBING HCP: "Who prescribed it?" Reason: if prescribed by specialist, call should be referred to that group.     *No Answer* 4. SYMPTOMS: "Do you have any symptoms?"     *No Answer* 5. SEVERITY: If symptoms are present, ask "Are they mild, moderate or severe?"     *No Answer* 6. PREGNANCY:  "Is there any chance that you are pregnant?" "When was your last menstrual period?"     *No Answer*  Protocols used: Medication Question Call-A-AH

## 2021-01-07 NOTE — Telephone Encounter (Signed)
Contacted pt to go over provider message pt didn't answer left a detailed vm making pt aware and if she has any questions or concerns to give [place] a call

## 2021-01-07 NOTE — Telephone Encounter (Signed)
Patient advised she should avoid Ibuprofen while on MTX. Patient expressed understanding. Patient will speak with PCP about other options.

## 2021-01-07 NOTE — Telephone Encounter (Signed)
Per Dr. Wynetta Emery Response: Pt's message reviewed.  Let pt know that she should touch base with her rheumatologist to inquire whether she can take Ibuprofen while on methotrexate as there is a potential interaction.   Contacted pt to go over provider message pt didn't answer left a detailed vm making pt aware and if she has any questions or concerns to give [place] a call

## 2021-01-07 NOTE — Telephone Encounter (Signed)
Patient called stating she requested a prescription of Ibuprofen 800 mg from her PCP and was advised that she needed to contact her rheumatologist to make sure there are no interactions with her Methotrexate.

## 2021-01-10 ENCOUNTER — Other Ambulatory Visit: Payer: Self-pay | Admitting: Internal Medicine

## 2021-01-10 DIAGNOSIS — F411 Generalized anxiety disorder: Secondary | ICD-10-CM

## 2021-01-10 DIAGNOSIS — F321 Major depressive disorder, single episode, moderate: Secondary | ICD-10-CM

## 2021-01-10 NOTE — Telephone Encounter (Signed)
Requested Prescriptions  Pending Prescriptions Disp Refills  . escitalopram (LEXAPRO) 10 MG tablet [Pharmacy Med Name: ESCITALOPRAM 10 MG TABLET] 30 tablet 0    Sig: 1/2 TAB DAILY X 4 WEEKS, THEN 1 TAB DAILY     Psychiatry:  Antidepressants - SSRI Passed - 01/10/2021  2:32 PM      Passed - Completed PHQ-2 or PHQ-9 in the last 360 days      Passed - Valid encounter within last 6 months    Recent Outpatient Visits          3 weeks ago Cocaine substance abuse [facility])   [place] Karle Plumber B, MD   1 year ago Moderate persistent reactive airway disease without complication   [place], MD   1 year ago Other bursal cyst, right elbow   [place], MD   2 years ago Arthritis of shoulder region, right   [place], Deborah B, MD   2 years ago Tobacco dependence   [place], MD      Future Appointments            In 3 weeks Wynetta Emery [name], MD [place]   In 3 months Bo Merino, MD [facility]   In 4 months [place], Ronalee Red, [facility], [facility]

## 2021-01-12 ENCOUNTER — Other Ambulatory Visit: Payer: Self-pay | Admitting: Internal Medicine

## 2021-01-12 DIAGNOSIS — F321 Major depressive disorder, single episode, moderate: Secondary | ICD-10-CM

## 2021-01-12 DIAGNOSIS — F411 Generalized anxiety disorder: Secondary | ICD-10-CM

## 2021-01-12 NOTE — Telephone Encounter (Signed)
Pt already given enough refill to last until upcoming appt. This refill request is not appropriate.  Requested Prescriptions  Pending Prescriptions Disp Refills   escitalopram (LEXAPRO) 10 MG tablet [Pharmacy Med Name: ESCITALOPRAM 10 MG TABLET] 90 tablet 1    Sig: TAKE 1/2 TABLET BY MOUTH EVERY DAY FOR 4 WEEKS, THEN 1 TABLET DAILY     Psychiatry:  Antidepressants - SSRI Passed - 01/12/2021  3:49 PM      Passed - Completed PHQ-2 or PHQ-9 in the last 360 days      Passed - Valid encounter within last 6 months    Recent Outpatient Visits           3 weeks ago Cocaine substance abuse ([place])   [place] Karle Plumber B, MD   1 year ago Moderate persistent reactive airway disease without complication   [place], MD   1 year ago Other bursal cyst, right elbow   [place], MD   2 years ago Arthritis of shoulder region, right   [place], Deborah B, MD   2 years ago Tobacco dependence   [place], MD       Future Appointments             In 3 weeks Wynetta Emery [name], MD [place]   In 3 months Bo Merino, MD [facility]   In 4 months [place], Ronalee Red, [facility], [facility]

## 2021-01-23 ENCOUNTER — Other Ambulatory Visit (HOSPITAL_COMMUNITY): Payer: Self-pay

## 2021-01-24 ENCOUNTER — Other Ambulatory Visit (HOSPITAL_COMMUNITY): Payer: Self-pay

## 2021-01-24 ENCOUNTER — Other Ambulatory Visit: Payer: Self-pay | Admitting: Physician Assistant

## 2021-01-24 MED ORDER — OTREXUP 20 MG/0.4ML ~~LOC~~ SOAJ
SUBCUTANEOUS | 0 refills | Status: DC
  Filled 2021-01-24: qty 1.6, 28d supply, fill #0

## 2021-01-24 NOTE — Telephone Encounter (Signed)
Next Visit: 04/17/2021   Last Visit: 11/14/2020   Last Fill: 05/23/2020  [id number] arthritis involving multiple sites with positive rheumatoid factor    Current Dose per office note 11/14/2020:  Otrexup 20 mg subcutaneous injections once weekly   Labs: 10/24/2020 WNL  Patient advised she is due to update labs. Patient will try to update next week.   Okay to refill Otrexup?

## 2021-01-25 ENCOUNTER — Other Ambulatory Visit (HOSPITAL_COMMUNITY): Payer: Self-pay

## 2021-01-28 ENCOUNTER — Other Ambulatory Visit (HOSPITAL_COMMUNITY): Payer: Self-pay

## 2021-02-04 ENCOUNTER — Other Ambulatory Visit: Payer: Self-pay | Admitting: Internal Medicine

## 2021-02-04 DIAGNOSIS — J4540 Moderate persistent asthma, uncomplicated: Secondary | ICD-10-CM

## 2021-02-04 NOTE — Telephone Encounter (Signed)
Last rx was 04/11/20

## 2021-02-05 ENCOUNTER — Ambulatory Visit: Payer: Medicare Other | Admitting: Internal Medicine

## 2021-02-25 ENCOUNTER — Other Ambulatory Visit (HOSPITAL_COMMUNITY): Payer: Self-pay

## 2021-02-25 ENCOUNTER — Other Ambulatory Visit: Payer: Self-pay | Admitting: Physician Assistant

## 2021-03-24 ENCOUNTER — Other Ambulatory Visit: Payer: Self-pay | Admitting: Internal Medicine

## 2021-03-24 NOTE — Telephone Encounter (Signed)
Requested Prescriptions  Pending Prescriptions Disp Refills   albuterol (VENTOLIN HFA) 108 (90 Base) MCG/ACT inhaler [Pharmacy Med Name: ALBUTEROL HFA (VENTOLIN) INH] 18 each 2    Sig: TAKE 2 PUFFS BY MOUTH EVERY 6 HOURS AS NEEDED FOR WHEEZE OR SHORTNESS OF BREATH     Pulmonology:  Beta Agonists Failed - 03/24/2021  2:57 PM      Failed - One inhaler should last at least one month. If the patient is requesting refills earlier, contact the patient to check for uncontrolled symptoms.      Passed - Valid encounter within last 12 months    Recent Outpatient Visits          3 months ago Cocaine substance abuse [facility])   [place] Ladell Pier, MD   1 year ago Moderate persistent reactive airway disease without complication   [place], MD   2 years ago Other bursal cyst, right elbow   [place], MD   2 years ago Arthritis of shoulder region, right   [place], Deborah B, MD   2 years ago Tobacco dependence   [place], MD      Future Appointments            In 3 weeks Bo Merino, MD [facility]   In 2 months [place], Ronalee Red, [facility], [facility]

## 2021-03-25 ENCOUNTER — Telehealth: Payer: Self-pay

## 2021-03-25 ENCOUNTER — Other Ambulatory Visit (HOSPITAL_COMMUNITY): Payer: Self-pay

## 2021-03-25 NOTE — Telephone Encounter (Signed)
Per Rinaldo Ratel, current Prior Authorization is expiring.  Submitted a Prior Authorization request to [facility] for ENBREL via CoverMyMeds. Request canceled due to patient currently having access to the requested medication and a Prior Authorization is not needed at this time.   Previous CMM Key: [id number]

## 2021-03-25 NOTE — Telephone Encounter (Signed)
Per Rinaldo Ratel, current Prior Authorization is expiring.  Submitted a Prior Authorization request to [facility] for OTREXUP via CoverMyMeds. Will update once we receive a response.   Key: [id number]

## 2021-03-25 NOTE — Telephone Encounter (Signed)
Received notification from [facility] regarding a prior authorization for OTREXUP. Authorization has been APPROVED from 03/10/2021 to 03/25/2022. Approval letter sent to scan center. Updated [employer] with current Prior Authorization information.  Authorization # [id number]

## 2021-04-03 ENCOUNTER — Telehealth: Payer: Self-pay | Admitting: Rheumatology

## 2021-04-03 DIAGNOSIS — Z79899 Other long term (current) drug therapy: Secondary | ICD-10-CM

## 2021-04-03 NOTE — Telephone Encounter (Signed)
Lab Orders released.  

## 2021-04-03 NOTE — Telephone Encounter (Signed)
Patient called the office requesting lab orders be sent to the [place] on [facility] in [place]. Patient states she is going either tomorrow or Friday.

## 2021-04-05 NOTE — Progress Notes (Deleted)
Office Visit Note  Patient: [name]             Date of Birth: May 18, 1960           MRN: [id number]             PCP: Ladell Pier, MD Referring: Ladell Pier, MD Visit Date: 04/17/2021 Occupation: @GUAROCC @  Subjective:  No chief complaint on file.   History of Present Illness: [name] is a 61 y.o. female ***   Activities of Daily Living:  Patient reports morning stiffness for *** {minute/hour:19697}.   Patient {ACTIONS;DENIES/REPORTS:21021675::"Denies"} nocturnal pain.  Difficulty dressing/grooming: {ACTIONS;DENIES/REPORTS:21021675::"Denies"} Difficulty climbing stairs: {ACTIONS;DENIES/REPORTS:21021675::"Denies"} Difficulty getting out of chair: {ACTIONS;DENIES/REPORTS:21021675::"Denies"} Difficulty using hands for taps, buttons, cutlery, and/or writing: {ACTIONS;DENIES/REPORTS:21021675::"Denies"}  No Rheumatology ROS completed.   PMFS History:  Patient Active Problem List   Diagnosis Date Noted   Malignant neoplasm of upper-outer quadrant of left breast in female, estrogen receptor positive ([place]) 09/15/2019   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   Adenomatous colon polyp 03/16/2019   Degenerative joint disease of shoulder region 06/08/2018   Rheumatoid arthritis involving both hands with positive rheumatoid factor ([place]) 06/08/2018   Rheumatoid arthritis involving both feet with positive rheumatoid factor ([place]) 06/08/2018   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   Status post total hip replacement, 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   Smoker 06/08/2018   Anxiety and depression 06/08/2018   Family history of breast cancer 03/25/2018   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   Rheumatoid arthritis involving multiple sites ([place]) 01/15/2018   Tobacco dependence 01/15/2018   Opioid use agreement exists 01/15/2018   Positive depression screening 01/15/2018   Homeless 01/15/2018    Past Medical History:  Diagnosis Date   Anemia    during pregnancy   Anxiety     Asthma    Bronchitis age 6   Cancer ([place])    left breast ca   COPD (chronic obstructive pulmonary disease) ([place])    Depression    Hx of bladder infections    been over 5 years since last one    Rheumatoid arthritis ([place])    unc rheum    Tobacco abuse    Trouble in sleeping     Family History  Problem Relation Age of Onset   Breast cancer Mother 8       metastatic   Cervical cancer Mother    Lung cancer Brother        maternal half brother; d. 19s   Lung cancer Maternal Uncle        d. 49s   Lung cancer Maternal Uncle        d. 26   Cancer Maternal Grandmother        unknown type; dx 60s   Healthy Son    Breast cancer Maternal Great-grandmother        dx unknown age   Past Surgical History:  Procedure Laterality Date   BREAST LUMPECTOMY WITH RADIOACTIVE SEED AND SENTINEL LYMPH NODE BIOPSY Left 08/30/2019   Procedure: LEFT BREAST LUMPECTOMY X 2 WITH RADIOACTIVE SEED AND SENTINEL LYMPH NODE BIOPSY;  Surgeon: [name], MD;  Location: [facility];  Service: General;  Laterality: Left;  PEC BLOCK   COLONOSCOPY WITH PROPOFOL N/A 03/15/2019   Procedure: COLONOSCOPY WITH PROPOFOL;  Surgeon: Jonathon Bellows, MD;  Location: [facility];  Service: Gastroenterology;  Laterality: N/A;   HIP ARTHROPLASTY     JOINT REPLACEMENT     MULTIPLE TOOTH EXTRACTIONS  RE-EXCISION OF BREAST LUMPECTOMY Left 10/07/2019   Procedure: RE-EXCISION OF LEFT BREAST INFERIOR MARGIN;  Surgeon: [name], MD;  Location: [facility];  Service: General;  Laterality: Left;   TOTAL HIP ARTHROPLASTY Left 02/15/2018   Procedure: TOTAL HIP ARTHROPLASTY ANTERIOR APPROACH;  Surgeon: [name], MD;  Location: [facility];  Service: Orthopedics;  Laterality: Left;   WISDOM TOOTH EXTRACTION     Social History   Social History Narrative   Not on file   Immunization History  Administered Date(s) Administered   Influenza,inj,Quad PF,6+ Mos 12/18/2020   PFIZER(Purple Top)SARS-COV-2 Vaccination  06/27/2019, 07/19/2019   Pneumococcal Conjugate-13 07/14/2018   Tdap 03/25/2018   Zoster Recombinat (Shingrix) 07/14/2018     Objective: Vital Signs: There were no vitals taken for this visit.   Physical Exam   Musculoskeletal Exam: ***  CDAI Exam: CDAI Score: -- Patient Global: --; Provider Global: -- Swollen: --; Tender: -- Joint Exam 04/17/2021   No joint exam has been documented for this visit   There is currently no information documented on the homunculus. Go to the Rheumatology activity and complete the homunculus joint exam.  Investigation: No additional findings.  Imaging: No results found.  Recent Labs: Lab Results  Component Value Date   WBC 5.1 12/14/2020   HGB 14.9 12/14/2020   PLT 178 12/14/2020   NA 138 12/14/2020   K 3.9 12/14/2020   CL 100 12/14/2020   CO2 29 12/14/2020   GLUCOSE 136 (H) 12/14/2020   BUN 16 12/14/2020   CREATININE 0.77 12/14/2020   BILITOT 1.3 (H) 12/14/2020   ALKPHOS 66 12/14/2020   AST 23 12/14/2020   ALT 18 12/14/2020   PROT 7.2 12/14/2020   ALBUMIN 4.4 12/14/2020   CALCIUM 9.2 12/14/2020   GFRAA 101 07/30/2020   QFTBGOLDPLUS NEGATIVE 07/30/2020    Speciality Comments: No specialty comments available.  Procedures:  No procedures performed Allergies: Other   Assessment / Plan:     Visit Diagnoses: No diagnosis found.  Orders: No orders of the defined types were placed in this encounter.  No orders of the defined types were placed in this encounter.   Face-to-face time spent with patient was *** minutes. Greater than 50% of time was spent in counseling and coordination of care.  Follow-Up Instructions: No follow-ups on file.   Earnestine Mealing, CMA  Note - This record has been created using Editor, commissioning.  Chart creation errors have been sought, but may not always  have been located. Such creation errors do not reflect on  the standard of medical care.

## 2021-04-17 ENCOUNTER — Ambulatory Visit: Payer: Medicare Other | Admitting: Rheumatology

## 2021-04-17 DIAGNOSIS — Z79899 Other long term (current) drug therapy: Secondary | ICD-10-CM

## 2021-04-17 DIAGNOSIS — M0579 Rheumatoid arthritis with rheumatoid factor of multiple sites without organ or systems involvement: Secondary | ICD-10-CM

## 2021-04-17 DIAGNOSIS — F419 Anxiety disorder, unspecified: Secondary | ICD-10-CM

## 2021-04-17 DIAGNOSIS — M19211 Secondary osteoarthritis, right shoulder: Secondary | ICD-10-CM

## 2021-04-17 DIAGNOSIS — D0512 Intraductal carcinoma in situ of left breast: Secondary | ICD-10-CM

## 2021-04-17 DIAGNOSIS — F17200 Nicotine dependence, unspecified, uncomplicated: Secondary | ICD-10-CM

## 2021-04-17 DIAGNOSIS — Z803 Family history of malignant neoplasm of breast: Secondary | ICD-10-CM

## 2021-04-17 DIAGNOSIS — Z96642 Presence of left artificial hip joint: Secondary | ICD-10-CM

## 2021-04-25 ENCOUNTER — Ambulatory Visit: Payer: Medicare Other | Admitting: Adult Health

## 2021-04-25 ENCOUNTER — Other Ambulatory Visit: Payer: Medicare Other

## 2021-05-03 ENCOUNTER — Encounter: Payer: Self-pay | Admitting: *Deleted

## 2021-05-03 ENCOUNTER — Telehealth: Payer: Self-pay

## 2021-05-03 NOTE — Telephone Encounter (Signed)
-----   Message from Gloris Ham, RN sent at 05/03/2021 11:08 AM EST ----- Regarding: ?social work consult for NP -who is sch. on 05/08/21 Hi team and Teresita-  I am reviewing this lady's chart for next week. H/o breast ca. Looks like she is a transfer from [employer] and lives in [place].  I copied  Dr. Virgie Dad note from below. I think she could benefit from social worker consult by reading this.  I am not sure what her current home situation is like @ this time. Looks like SW was involved in her care. Also has h/o cocaine abuse.  Thanks- Heather H     PLAN: Faelynn is now both psychologically and socially and more stable.  She is ready to face possible "bad news" such as the results of her screening CT for lung cancer (which I am hopeful of course will be fine).  The same with genetics.  She simply could not handle any more bad news when the world was collapsing around her but now that she has shelter and some security and even some pocket money she is willing to proceed.  Accordingly I am setting up for a screening CT of the lungs at [facility] (getting here is not only inconvenient but expensive for her).  She also requests that we move further oncology follow-up to our [place] group and I have arranged for that as well today.  Otherwise the plan is to continue tamoxifen a total of 5years.  She just had mammography and will need a repeat mammogram next September.  I have encouraged her to call us with any other issue that may develop.      Addendum: Subsequent note from our social worker: Received return call from patient. She reports that she is staying in her previous housing until 11/08/2020 and will then be living in a camper on her son's property until she is able to get off of a waitlist for housing in [place]. She is happy with this arrangement as [place] [facility], she has her own space but is close to family, and feels more stable.   Patient  reports that she did not realize she had an appointment this morning with Dr. [name]. She plans to reschedule and continue follow-up care here at [facility].  Virgie Dad. Magrinat, MD 11/27/20

## 2021-05-03 NOTE — Telephone Encounter (Signed)
I copied this message forward from a staff message.

## 2021-05-08 ENCOUNTER — Inpatient Hospital Stay: Payer: Medicare Other

## 2021-05-08 ENCOUNTER — Inpatient Hospital Stay: Payer: Medicare Other | Attending: Internal Medicine | Admitting: Internal Medicine

## 2021-05-16 ENCOUNTER — Telehealth: Payer: Self-pay | Admitting: Rheumatology

## 2021-05-16 ENCOUNTER — Other Ambulatory Visit: Payer: Self-pay | Admitting: *Deleted

## 2021-05-16 DIAGNOSIS — Z79899 Other long term (current) drug therapy: Secondary | ICD-10-CM

## 2021-05-16 NOTE — Telephone Encounter (Signed)
Patient advised her lab orders have been released.  ?

## 2021-05-16 NOTE — Telephone Encounter (Signed)
Patient called the office stating she would like lab orders [place] in [place]. Patient states she is going today. Patient requests a call when they have been released. ?

## 2021-05-17 ENCOUNTER — Other Ambulatory Visit: Payer: Self-pay | Admitting: *Deleted

## 2021-05-17 DIAGNOSIS — C50412 Malignant neoplasm of upper-outer quadrant of left female breast: Secondary | ICD-10-CM

## 2021-05-20 ENCOUNTER — Encounter: Payer: Self-pay | Admitting: Licensed Clinical Social Worker

## 2021-05-20 NOTE — Progress Notes (Signed)
[place] ?Clinical Social Work ? ?Clinical Social Work was referred by nurse for assessment of psychosocial needs.  Clinical Social Worker contacted patient by phone  to offer support and assess for needs.  CSW left voicemail with contact information and request for return call. ? ? ? ?Nelida Mandarino, LCSW  ?Clinical Social Worker ?[place] ?      ?First Attempt ?

## 2021-05-21 ENCOUNTER — Other Ambulatory Visit (HOSPITAL_COMMUNITY): Payer: Self-pay

## 2021-05-22 ENCOUNTER — Ambulatory Visit (INDEPENDENT_AMBULATORY_CARE_PROVIDER_SITE_OTHER): Payer: Medicare Other | Admitting: Physician Assistant

## 2021-05-22 ENCOUNTER — Encounter: Payer: Self-pay | Admitting: Physician Assistant

## 2021-05-22 ENCOUNTER — Telehealth: Payer: Self-pay | Admitting: Rheumatology

## 2021-05-22 ENCOUNTER — Other Ambulatory Visit: Payer: Self-pay

## 2021-05-22 VITALS — BP 105/73 | HR 98 | Ht 65.5 in | Wt 107.4 lb

## 2021-05-22 DIAGNOSIS — Z111 Encounter for screening for respiratory tuberculosis: Secondary | ICD-10-CM | POA: Diagnosis not present

## 2021-05-22 DIAGNOSIS — D0512 Intraductal carcinoma in situ of left breast: Secondary | ICD-10-CM

## 2021-05-22 DIAGNOSIS — R0989 Other specified symptoms and signs involving the circulatory and respiratory systems: Secondary | ICD-10-CM

## 2021-05-22 DIAGNOSIS — M19211 Secondary osteoarthritis, right shoulder: Secondary | ICD-10-CM

## 2021-05-22 DIAGNOSIS — M0579 Rheumatoid arthritis with rheumatoid factor of multiple sites without organ or systems involvement: Secondary | ICD-10-CM | POA: Diagnosis not present

## 2021-05-22 DIAGNOSIS — M24522 Contracture, left elbow: Secondary | ICD-10-CM | POA: Diagnosis not present

## 2021-05-22 DIAGNOSIS — F17200 Nicotine dependence, unspecified, uncomplicated: Secondary | ICD-10-CM

## 2021-05-22 DIAGNOSIS — Z79899 Other long term (current) drug therapy: Secondary | ICD-10-CM | POA: Diagnosis not present

## 2021-05-22 DIAGNOSIS — F32A Depression, unspecified: Secondary | ICD-10-CM

## 2021-05-22 DIAGNOSIS — Z96642 Presence of left artificial hip joint: Secondary | ICD-10-CM

## 2021-05-22 DIAGNOSIS — Z803 Family history of malignant neoplasm of breast: Secondary | ICD-10-CM

## 2021-05-22 MED ORDER — PREDNISONE 5 MG PO TABS: 5.0000 mg | ORAL_TABLET | Freq: Every day | ORAL | 0 refills | Status: DC

## 2021-05-22 NOTE — Telephone Encounter (Signed)
Patient called the office stating she is being sent for a lung screening by Dr. Estanislado Pandy and would like to go somewhere in [place] if possible. Patient states her oncologist is in [place] so it would be easier to go somewhere out there. ?

## 2021-05-22 NOTE — Progress Notes (Signed)
? ?Office Visit Note ? ?Patient: [name]             ?Date of Birth: 04-19-1960           ?MRN: [id number]             ?PCP: Ladell Pier, MD ?Referring: Ladell Pier, MD ?Visit Date: 05/22/2021 ?Occupation: '@GUAROCC'$ @ ? ?Subjective:  ?Right shoulder pain ? ?History of Present Illness: [name] is a 61 y.o. female with history of rheumatoid arthritis and osteoarthritis.  The patient has been out of her prescriptions for Enbrel and otrexup for the past 1 month due to requiring a follow-up visit as well as updated lab work.  She reports that she is currently having significant discomfort in her right shoulder joint in both hands.  She has difficulty writing and performing her responsibilities at work due to the severity of pain in her right shoulder and hands.  She denies any visible joint swelling at this time.  She denies any recent infections. ?She continues to experience intermittent breathlessness, shortness of breath, and a cough at times.  She has not yet scheduled the chest CT previously ordered.  ? ? ?Activities of Daily Living:  ?Patient reports morning stiffness for 1-1.5 hours.   ?Patient Reports nocturnal pain.  ?Difficulty dressing/grooming: Reports ?Difficulty climbing stairs: Denies ?Difficulty getting out of chair: Denies ?Difficulty using hands for taps, buttons, cutlery, and/or writing: Reports ? ?Review of Systems  ?Constitutional:  Positive for fatigue.  ?HENT:  Positive for mouth dryness. Negative for mouth sores and nose dryness.   ?Eyes:  Negative for pain, itching and dryness.  ?Respiratory:  Positive for shortness of breath and difficulty breathing.   ?Cardiovascular:  Negative for chest pain and palpitations.  ?Gastrointestinal:  Negative for blood in stool, constipation and diarrhea.  ?Endocrine: Positive for increased urination.  ?Genitourinary:  Negative for difficulty urinating.  ?Musculoskeletal:  Positive for joint pain, joint pain, joint swelling, myalgias, morning  stiffness, muscle tenderness and myalgias.  ?Skin:  Negative for color change, rash and redness.  ?Allergic/Immunologic: Negative for susceptible to infections.  ?Neurological:  Positive for numbness and weakness. Negative for dizziness, headaches and memory loss.  ?Hematological:  Positive for bruising/bleeding tendency.  ?Psychiatric/Behavioral:  Negative for confusion.   ? ?PMFS History:  ?Patient Active Problem List  ? Diagnosis Date Noted  ? Malignant neoplasm of upper-outer quadrant of left breast in female, estrogen receptor positive ([place]) 09/15/2019  ? 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  ? Adenomatous colon polyp 03/16/2019  ? Degenerative joint disease of shoulder region 06/08/2018  ? Rheumatoid arthritis involving both hands with positive rheumatoid factor ([place]) 06/08/2018  ? Rheumatoid arthritis involving both feet with positive rheumatoid factor ([place]) 06/08/2018  ? 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  ? Status post total hip replacement, 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  ? Smoker 06/08/2018  ? Anxiety and depression 06/08/2018  ? Family history of breast cancer 03/25/2018  ? 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 Rheumatoid arthritis involving multiple sites ([place]) 01/15/2018  ? Tobacco dependence 01/15/2018  ? Opioid use agreement exists 01/15/2018  ? Positive depression screening 01/15/2018  ? Homeless 01/15/2018  ?  ?Past Medical History:  ?Diagnosis Date  ? Anemia   ? during pregnancy  ? Anxiety   ? Asthma   ? Bronchitis age 19  ? Cancer [facility])   ? left breast ca  ? COPD (chronic obstructive pulmonary disease) ([place])   ? Depression   ? Hx  of bladder infections   ? been over 5 years since last one   ? Malignant neoplasm of upper-outer quadrant of left breast in female, estrogen receptor positive ([place])   ? Rheumatoid arthritis ([place])   ? unc rheum   ? Tobacco abuse   ? Trouble in sleeping   ?  ?Family History  ?Problem Relation Age of Onset  ? Breast cancer Mother 43  ?     metastatic  ? Cervical cancer  Mother   ? Lung cancer Brother   ?     maternal half brother; d. 45s  ? Lung cancer Maternal Uncle   ?     d. 58s  ? Lung cancer Maternal Uncle   ?     d. 25  ? Cancer Maternal Grandmother   ?     unknown type; dx 90s  ? Healthy Son   ? Breast cancer Maternal Great-grandmother   ?     dx unknown age  ? ?Past Surgical History:  ?Procedure Laterality Date  ? BREAST LUMPECTOMY WITH RADIOACTIVE SEED AND SENTINEL LYMPH NODE BIOPSY Left 08/30/2019  ? Procedure: LEFT BREAST LUMPECTOMY X 2 WITH RADIOACTIVE SEED AND SENTINEL LYMPH NODE BIOPSY;  Surgeon: [name], MD;  Location: [facility];  Service: General;  Laterality: Left;  PEC BLOCK  ? COLONOSCOPY WITH PROPOFOL N/A 03/15/2019  ? Procedure: COLONOSCOPY WITH PROPOFOL;  Surgeon: Jonathon Bellows, MD;  Location: [facility] ENDOSCOPY;  Service: Gastroenterology;  Laterality: N/A;  ? HIP ARTHROPLASTY    ? JOINT REPLACEMENT    ? MULTIPLE TOOTH EXTRACTIONS    ? RE-EXCISION OF BREAST LUMPECTOMY Left 10/07/2019  ? Procedure: RE-EXCISION OF LEFT BREAST INFERIOR MARGIN;  Surgeon: [name], MD;  Location: [facility];  Service: General;  Laterality: Left;  ? TOTAL HIP ARTHROPLASTY Left 02/15/2018  ? Procedure: TOTAL HIP ARTHROPLASTY ANTERIOR APPROACH;  Surgeon: [name], MD;  Location: [facility];  Service: Orthopedics;  Laterality: Left;  ? WISDOM TOOTH EXTRACTION    ? ?Social History  ? ?Social History Narrative  ? Not on file  ? ?Immunization History  ?Administered Date(s) Administered  ? Influenza,inj,Quad PF,6+ Mos 12/18/2020  ? PFIZER(Purple Top)SARS-COV-2 Vaccination 06/27/2019, 07/19/2019  ? Pneumococcal Conjugate-13 07/14/2018  ? Tdap 03/25/2018  ? Zoster Recombinat (Shingrix) 07/14/2018  ?  ? ?Objective: ?Vital Signs: BP 105/73 (BP Location: Left Arm, Patient Position: Sitting, Cuff Size: Normal)   Pulse 98   Ht 5' 5.5" (1.664 m)   Wt 107 lb 6.4 oz (48.7 kg)   BMI 17.60 kg/m?   ? ?Physical Exam ?Vitals and nursing note reviewed.  ?Constitutional:   ?   Appearance:  She is well-developed.  ?HENT:  ?   Head: Normocephalic and atraumatic.  ?Eyes:  ?   Conjunctiva/sclera: Conjunctivae normal.  ?Cardiovascular:  ?   Rate and Rhythm: Normal rate and regular rhythm.  ?   Heart sounds: Normal heart sounds.  ?Pulmonary:  ?   Effort: Pulmonary effort is normal.  ?   Breath sounds: Normal breath sounds.  ?Abdominal:  ?   General: Bowel sounds are normal.  ?   Palpations: Abdomen is soft.  ?Musculoskeletal:  ?   Cervical back: Normal range of motion.  ?Skin: ?   General: Skin is warm and dry.  ?   Capillary Refill: Capillary refill takes less than 2 seconds.  ?Neurological:  ?   Mental Status: She is alert and oriented to person, place, and time.  ?Psychiatric:     ?  Behavior: Behavior normal.  ?  ? ?Musculoskeletal Exam: C-spine has limited range of motion without rotation.  Painful range of motion of both shoulder joints with limited abduction of the right shoulder to about 120 degrees.  Crepitus in the right shoulder was noted.  Elbow joints have good range of motion with no tenderness or inflammation.  Rheumatoid nodule palpable on extensor surface of the right elbow.  Wrist joints have good range of motion with no tenderness or synovitis.  Ulnar deviation and thickening of MCP joints noted.  No tenderness or synovitis over MCP or PIP joints.  PIP and DIP thickening consistent with osteoarthritis of both hands.  Right hip has good range of motion with no discomfort.  Left hip replacement has limited range of motion with discomfort.  Knee joints have good range of motion with no warmth or effusion.  Ankle joints have good range of motion with no tenderness or joint swelling.  No tenderness over MTP joints. ? ?CDAI Exam: ?CDAI Score: 1.2  ?Patient Global: 6 mm; Provider Global: 6 mm ?Swollen: 0 ; Tender: 0  ?Joint Exam 05/22/2021  ? ?No joint exam has been documented for this visit  ? ?There is currently no information documented on the homunculus. Go to the Rheumatology activity and  complete the homunculus joint exam. ? ?Investigation: ?No additional findings. ? ?Imaging: ?No results found. ? ?Recent Labs: ?Lab Results  ?Component Value Date  ? WBC 5.1 12/14/2020  ? HGB 14.9 10/07/2

## 2021-05-23 ENCOUNTER — Other Ambulatory Visit: Payer: Self-pay

## 2021-05-23 ENCOUNTER — Other Ambulatory Visit (HOSPITAL_COMMUNITY): Payer: Self-pay

## 2021-05-23 LAB — CBC WITH DIFFERENTIAL/PLATELET
Absolute Monocytes: 486 cells/uL (ref 200–950)
Basophils Absolute: 38 cells/uL (ref 0–200)
Basophils Relative: 0.7 %
Eosinophils Absolute: 259 cells/uL (ref 15–500)
Eosinophils Relative: 4.8 %
HCT: 45.3 % — ABNORMAL HIGH (ref 35.0–45.0)
Hemoglobin: 15 g/dL (ref 11.7–15.5)
Lymphs Abs: 1771 cells/uL (ref 850–3900)
MCH: 29.4 pg (ref 27.0–33.0)
MCHC: 33.1 g/dL (ref 32.0–36.0)
MCV: 88.6 fL (ref 80.0–100.0)
MPV: 10 fL (ref 7.5–12.5)
Monocytes Relative: 9 %
Neutro Abs: 2846 cells/uL (ref 1500–7800)
Neutrophils Relative %: 52.7 %
Platelets: 222 10*3/uL (ref 140–400)
RBC: 5.11 10*6/uL — ABNORMAL HIGH (ref 3.80–5.10)
RDW: 12.5 % (ref 11.0–15.0)
Total Lymphocyte: 32.8 %
WBC: 5.4 10*3/uL (ref 3.8–10.8)

## 2021-05-23 LAB — COMPLETE METABOLIC PANEL WITH GFR
AG Ratio: 1.6 (calc) (ref 1.0–2.5)
ALT: 13 U/L (ref 6–29)
AST: 15 U/L (ref 10–35)
Albumin: 4.2 g/dL (ref 3.6–5.1)
Alkaline phosphatase (APISO): 71 U/L (ref 37–153)
BUN: 19 mg/dL (ref 7–25)
CO2: 31 mmol/L (ref 20–32)
Calcium: 9.5 mg/dL (ref 8.6–10.4)
Chloride: 104 mmol/L (ref 98–110)
Creat: 0.85 mg/dL (ref 0.50–1.05)
Globulin: 2.7 g/dL (calc) (ref 1.9–3.7)
Glucose, Bld: 99 mg/dL (ref 65–99)
Potassium: 4.8 mmol/L (ref 3.5–5.3)
Sodium: 144 mmol/L (ref 135–146)
Total Bilirubin: 0.4 mg/dL (ref 0.2–1.2)
Total Protein: 6.9 g/dL (ref 6.1–8.1)
eGFR: 77 mL/min/{1.73_m2} (ref >=60)

## 2021-05-23 MED ORDER — OTREXUP 20 MG/0.4ML ~~LOC~~ SOAJ
20.0000 mg | SUBCUTANEOUS | 0 refills | Status: DC
  Filled 2021-05-23: qty 4.8, fill #0
  Filled 2021-05-23: qty 4.8, 84d supply, fill #0

## 2021-05-23 MED ORDER — ENBREL MINI 50 MG/ML ~~LOC~~ SOCT
50.0000 mg | SUBCUTANEOUS | 0 refills | Status: DC
  Filled 2021-05-23: qty 4, 28d supply, fill #0
  Filled 2021-05-23: qty 12, 84d supply, fill #0
  Filled 2021-07-19: qty 4, 28d supply, fill #1
  Filled 2021-08-16: qty 4, 28d supply, fill #2

## 2021-05-23 NOTE — Telephone Encounter (Signed)
RBC count and Hct are borderline elevated. Rest of CBC WNL.  ?

## 2021-05-23 NOTE — Telephone Encounter (Signed)
Pending lab results, please refill enbrel and otrexup per Hazel Sams, PA-C. Thanks!  ?

## 2021-05-23 NOTE — Progress Notes (Signed)
RBC count and Hct are borderline elevated. Rest of CBC WNL.

## 2021-05-24 ENCOUNTER — Other Ambulatory Visit (HOSPITAL_COMMUNITY): Payer: Self-pay

## 2021-05-27 ENCOUNTER — Encounter: Payer: Self-pay | Admitting: Licensed Clinical Social Worker

## 2021-05-27 ENCOUNTER — Other Ambulatory Visit (HOSPITAL_COMMUNITY): Payer: Self-pay

## 2021-05-27 NOTE — Progress Notes (Signed)
[facility] Clinical Social Work  ?Initial Assessment ? ? ?[name] is a 61 y.o. year old female contacted by phone. Clinical Social Work was referred by medical provider for assessment of psychosocial needs.  ? ?SDOH (Social Determinants of Health) assessments performed: Yes ?  ?Distress Screen completed: No ?ONCBCN DISTRESS SCREENING 11/21/2019  ?Screening Type Initial Screening  ?Distress experienced in past week (1-10) 5  ?Referral to clinical social work Yes  ?Other reports staying with a friend but is homeless, yes she is open to the social work referral  ? ? ? ? ?Family/Social Information:  ?Housing Arrangement: patient lives with roommate . ?Family members/support persons in your life? Family ?Transportation concerns: no, patient is concerned about the state of her vehicle.  CSW and patient discussed other transportation options like, [place]AT, Medicaid transportation and [facility] transportation assistance.  ?Employment: Disabled. Income source: Social Security Disability ?Financial concerns:  Patient does not have immediate concerns but does live on a fixed income. ?Type of concern: Transportation ?Food access concerns: no ?Religious or spiritual practice: no ?Services Currently in place:  Medicaid/Disability ? ?Coping/ Adjustment to diagnosis: ?Patient understands treatment plan and what happens next? yes ?Concerns about diagnosis and/or treatment:  Patient state she was concerned about living situation but is living with a new roommate and is on the waiting list for public housing. ?Patient reported stressors: Transportation ?Hopes and priorities: to purchase a new car in the future ?Patient enjoys time with family/ friends ?Current coping skills/ strengths: Average or above average intelligence , Capable of independent living , and Communication skills  ? ? ? SUMMARY: ?Current SDOH Barriers:  ?Financial constraints related to fixed income and Transportation ? ?Clinical Social Work Clinical Goal(s):  ?patient will  work with [employer] DSS [place] caseworker to address needs related to Medicaid transportation ? ?Interventions: ?Discussed common feeling and emotions when being diagnosed with cancer, and the importance of support during treatment ?Informed patient of the support team roles and support services at [facility] ?Provided CSW contact information and encouraged patient to call with any questions or concerns ?Referred patient to [employer] Medicaid case work to discuss transportation needs and [facility] transportation department and Provided patient with information about CSW role in patient care and other available resources. ? ? ?Follow Up Plan: Patient will contact CSW with any support or resource needs ?Patient verbalizes understanding of plan: Yes ? ?Patient stated she it "about 97% cured from breast cancer", but she will contact CSW if there are any new needs or concerns. Patient also stated she will schedule her lung screening soon and will contact CSW if there are any new concerns or needs. ? ?Payge Eppes, LCSW ?

## 2021-05-28 ENCOUNTER — Other Ambulatory Visit (HOSPITAL_COMMUNITY): Payer: Self-pay

## 2021-05-29 ENCOUNTER — Ambulatory Visit: Payer: Medicare Other | Admitting: Physician Assistant

## 2021-05-31 ENCOUNTER — Other Ambulatory Visit (HOSPITAL_COMMUNITY): Payer: Self-pay

## 2021-06-03 ENCOUNTER — Other Ambulatory Visit (HOSPITAL_COMMUNITY): Payer: Self-pay

## 2021-06-11 ENCOUNTER — Other Ambulatory Visit (HOSPITAL_COMMUNITY): Payer: Self-pay

## 2021-06-13 ENCOUNTER — Other Ambulatory Visit (HOSPITAL_COMMUNITY): Payer: Self-pay

## 2021-06-19 ENCOUNTER — Other Ambulatory Visit (HOSPITAL_COMMUNITY): Payer: Self-pay

## 2021-06-20 ENCOUNTER — Encounter: Payer: Self-pay | Admitting: Internal Medicine

## 2021-06-20 ENCOUNTER — Ambulatory Visit: Payer: Medicare Other | Attending: Internal Medicine | Admitting: Internal Medicine

## 2021-06-20 ENCOUNTER — Telehealth: Payer: Self-pay | Admitting: Rheumatology

## 2021-06-20 ENCOUNTER — Ambulatory Visit
Admission: RE | Admit: 2021-06-20 | Discharge: 2021-06-20 | Disposition: A | Discharge status: Home / Self Care | Payer: Medicare Other | Source: Ambulatory Visit | Attending: Internal Medicine | Admitting: Internal Medicine

## 2021-06-20 VITALS — BP 109/77 | HR 99 | Resp 16 | Wt 103.8 lb

## 2021-06-20 DIAGNOSIS — R0609 Other forms of dyspnea: Secondary | ICD-10-CM

## 2021-06-20 DIAGNOSIS — R053 Chronic cough: Secondary | ICD-10-CM | POA: Diagnosis not present

## 2021-06-20 DIAGNOSIS — Z96649 Presence of unspecified artificial hip joint: Secondary | ICD-10-CM | POA: Diagnosis not present

## 2021-06-20 DIAGNOSIS — F32A Depression, unspecified: Secondary | ICD-10-CM | POA: Diagnosis not present

## 2021-06-20 DIAGNOSIS — F419 Anxiety disorder, unspecified: Secondary | ICD-10-CM | POA: Diagnosis not present

## 2021-06-20 DIAGNOSIS — M069 Rheumatoid arthritis, unspecified: Secondary | ICD-10-CM | POA: Diagnosis not present

## 2021-06-20 DIAGNOSIS — Z01818 Encounter for other preprocedural examination: Secondary | ICD-10-CM | POA: Diagnosis not present

## 2021-06-20 DIAGNOSIS — Z87891 Personal history of nicotine dependence: Secondary | ICD-10-CM | POA: Diagnosis not present

## 2021-06-20 DIAGNOSIS — Z79899 Other long term (current) drug therapy: Secondary | ICD-10-CM | POA: Insufficient documentation

## 2021-06-20 DIAGNOSIS — Z853 Personal history of malignant neoplasm of breast: Secondary | ICD-10-CM | POA: Insufficient documentation

## 2021-06-20 DIAGNOSIS — J449 Chronic obstructive pulmonary disease, unspecified: Secondary | ICD-10-CM | POA: Diagnosis present

## 2021-06-20 MED ORDER — UMECLIDINIUM BROMIDE 62.5 MCG/ACT IN AEPB: 1.0000 | INHALATION_SPRAY | Freq: Every day | RESPIRATORY_TRACT | 4 refills | Status: DC

## 2021-06-20 NOTE — Telephone Encounter (Signed)
Patient called the office stating she received a few voicemail's from our office regarding scheduling a chest CT. Patient states called the facility where she would get it done and they told her they needed new orders. Patient states she knows she waited too long but is ready to schedule. Patient states if she does not answer to leave a message. ?

## 2021-06-20 NOTE — Progress Notes (Addendum)
? ? ?Patient ID: [name], female    DOB: 09/17/1960  MRN: [id number] ? ?CC: Medical Clearance (Dentist ) ? ? ?Subjective: ?[name] is a 61 y.o. female who presents for med clearance for dental extractions. ?Her concerns today include:  ?Patient with history of rheumatoid arthritis, arthritis RT shoulder, tob dep, THR, anxiety/depression, stress incontinence.   ? ?Pt will have multiple teeth extractions top and bottom in one setting.  Will get partial dentures afterwards.  Pt not sure if it will be done under general anesthesia. Question is whether she needs abx prior to extractions given hx of THR and being a breast cancer survivor.   ? ?She c/o more difficulty with breathing on exertion.  Present for over 1 year along with persistent cough.  Cough is sometimes productive of phlegm.  No blood in the mucus.  She endorses wheezing at times.  No chest pains or swelling in the legs.  I prescribed Symbicort for her 03/2019 after she reported increased use of albuterol inhaler after moving into a house that had a wood-burning stove.  She feels that the Symbicort has not helped. ?-She has a greater than 20-pack-year history of smoking.  She had quit for a while but then had a relapse in the fall of last year when she had short relapse of street drug use.  She now smokes a cigarette occasionally.  She is wearing her patches. ?-Low-dose CT of the chest for lung cancer screening was ordered by her oncologist 11/2020 but she never had it done. ?-On methotrexate through her rheumatologist for RA.  On her last visit with them 05/22/2021, she reported that shortness of breath.  She was noted to have bibasilar crackles.  High-resolution CT of the chest was ordered.  This is scheduled for 1 week from today.  Last CXR was done 02/2018.  This revealed hyperinflation of the lungs. ? ?Patient Active Problem List  ? Diagnosis Date Noted  ? Malignant neoplasm of upper-outer quadrant of left breast in female, estrogen receptor  positive ([place]) 09/15/2019  ? 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  ? Adenomatous colon polyp 03/16/2019  ? Degenerative joint disease of shoulder region 06/08/2018  ? Rheumatoid arthritis involving both hands with positive rheumatoid factor ([place]) 06/08/2018  ? Rheumatoid arthritis involving both feet with positive rheumatoid factor ([place]) 06/08/2018  ? 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  ? Status post total hip replacement, 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  ? Smoker 06/08/2018  ? Anxiety and depression 06/08/2018  ? Family history of breast cancer 03/25/2018  ? 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 Rheumatoid arthritis involving multiple sites ([place]) 01/15/2018  ? Tobacco dependence 01/15/2018  ? Opioid use agreement exists 01/15/2018  ? Positive depression screening 01/15/2018  ? Homeless 01/15/2018  ?  ? ?Current Outpatient Medications on File Prior to Visit  ?Medication Sig Dispense Refill  ? escitalopram (LEXAPRO) 10 MG tablet Take 1 tablet (10 mg total) by mouth daily. TAKE 1/2 TABLET BY MOUTH EVERY DAY FOR 4 WEEKS, THEN 1 TABLET DAILY (Patient not taking: Reported on 05/22/2021) 90 tablet 0  ? albuterol (VENTOLIN HFA) 108 (90 Base) MCG/ACT inhaler TAKE 2 PUFFS BY MOUTH EVERY 6 HOURS AS NEEDED FOR WHEEZE OR SHORTNESS OF BREATH 18 each 2  ? Cholecalciferol (VITAMIN D3) 75 MCG (3000 UT) TABS Take 3,000 Units by mouth daily. (Patient not taking: Reported on 05/22/2021)    ? cyanocobalamin 100 MCG tablet Take 100 mcg by mouth daily. (Patient not taking: Reported on  05/22/2021)    ? diazepam (VALIUM) 2 MG tablet Take 1/2 tablet (1 mg total) by mouth before procedure. (Patient not taking: Reported on 11/14/2020) 1 tablet 0  ? Etanercept (ENBREL MINI) 50 MG/ML SOCT INJECT 50 MG INTO THE SKIN ONCE A WEEK. 12 mL 0  ? Methotrexate, PF, (OTREXUP) 20 MG/0.4ML SOAJ INJECT 20 MG INTO THE SKIN ONCE A WEEK. (Patient not taking: Reported on 05/22/2021) 1.6 mL 0  ? Methotrexate, PF, (OTREXUP) 20 MG/0.4ML SOAJ Inject 20 mg into the  skin once a week. 4.8 mL 0  ? Multiple Vitamin (MULTIVITAMIN WITH MINERALS) TABS tablet Take 1 tablet by mouth daily.    ? nicotine (NICODERM CQ - DOSED IN MG/24 HOURS) 14 mg/24hr patch PLACE 1 PATCH ONTO THE SKIN DAILY. 28 patch 1  ? predniSONE (DELTASONE) 5 MG tablet Take 1 tablet (5 mg total) by mouth daily with breakfast. 30 tablet 0  ? SYMBICORT 80-4.5 MCG/ACT inhaler INHALE 2 PUFFS BY MOUTH TWICE A DAY 30.6 each 2  ? tamoxifen (NOLVADEX) 20 MG tablet TAKE 1 TABLET BY MOUTH ONCE A DAY 90 tablet 3  ? ?No current facility-administered medications on file prior to visit.  ? ? ?Allergies  ?Allergen Reactions  ? Other Rash  ?  Unknown cleaner used in OR  ? ? ?Social History  ? ?Socioeconomic History  ? Marital status: Single  ?  Spouse name: Not on file  ? Number of children: 1  ? Years of education: Not on file  ? Highest education level: Not on file  ?Occupational History  ? Not on file  ?Tobacco Use  ? Smoking status: Former  ?  Packs/day: 0.25  ?  Types: Cigarettes  ?  Quit date: 09/08/2019  ?  Years since quitting: 1.7  ?  Passive exposure: Past  ? Smokeless tobacco: Never  ? Tobacco comments:  ?  on nicotine patches  ?Vaping Use  ? Vaping Use: Never used  ?Substance and Sexual Activity  ? Alcohol use: Yes  ?  Comment: 2 glasses of wine weekly  ? Drug use: Not Currently  ?  Types: Cocaine  ? Sexual activity: Not on file  ?Other Topics Concern  ? Not on file  ?Social History Narrative  ? Not on file  ? ?Social Determinants of Health  ? ?Financial Resource Strain: Medium Risk  ? Difficulty of Paying Living Expenses: Somewhat hard  ?Food Insecurity: No Food Insecurity  ? Worried About Charity fundraiser in the Last Year: Never true  ? Ran Out of Food in the Last Year: Never true  ?Transportation Needs: Unmet Transportation Needs  ? Lack of Transportation (Medical): Yes  ? Lack of Transportation (Non-Medical): Yes  ?Physical Activity: Inactive  ? Days of Exercise per Week: 0 days  ? Minutes of Exercise per Session:  0 min  ?Stress: Stress Concern Present  ? Feeling of Stress : To some extent  ?Social Connections: Socially Isolated  ? Frequency of Communication with Friends and Family: Once a week  ? Frequency of Social Gatherings with Friends and Family: Twice a week  ? Attends Religious Services: Never  ? Active Member of Clubs or Organizations: No  ? Attends Archivist Meetings: Never  ? Marital Status: Divorced  ?Intimate Partner Violence: Not At Risk  ? Fear of Current or Ex-Partner: No  ? Emotionally Abused: No  ? Physically Abused: No  ? Sexually Abused: No  ? ? ?Family History  ?Problem Relation Age of Onset  ?  Breast cancer Mother 60  ?     metastatic  ? Cervical cancer Mother   ? Lung cancer Brother   ?     maternal half brother; d. 34s  ? Lung cancer Maternal Uncle   ?     d. 46s  ? Lung cancer Maternal Uncle   ?     d. 60  ? Cancer Maternal Grandmother   ?     unknown type; dx 90s  ? Healthy Son   ? Breast cancer Maternal Great-grandmother   ?     dx unknown age  ? ? ?Past Surgical History:  ?Procedure Laterality Date  ? BREAST LUMPECTOMY WITH RADIOACTIVE SEED AND SENTINEL LYMPH NODE BIOPSY Left 08/30/2019  ? Procedure: LEFT BREAST LUMPECTOMY X 2 WITH RADIOACTIVE SEED AND SENTINEL LYMPH NODE BIOPSY;  Surgeon: [name], MD;  Location: [facility];  Service: General;  Laterality: Left;  PEC BLOCK  ? COLONOSCOPY WITH PROPOFOL N/A 03/15/2019  ? Procedure: COLONOSCOPY WITH PROPOFOL;  Surgeon: Jonathon Bellows, MD;  Location: [facility];  Service: Gastroenterology;  Laterality: N/A;  ? HIP ARTHROPLASTY    ? JOINT REPLACEMENT    ? MULTIPLE TOOTH EXTRACTIONS    ? RE-EXCISION OF BREAST LUMPECTOMY Left 10/07/2019  ? Procedure: RE-EXCISION OF LEFT BREAST INFERIOR MARGIN;  Surgeon: [name], MD;  Location: [facility];  Service: General;  Laterality: Left;  ? TOTAL HIP ARTHROPLASTY Left 02/15/2018  ? Procedure: TOTAL HIP ARTHROPLASTY ANTERIOR APPROACH;  Surgeon: [name], MD;  [facility];   Service: Orthopedics;  Laterality: Left;  ? WISDOM TOOTH EXTRACTION    ? ? ?ROS: ?Review of Systems ?Negative except as stated above ? ?PHYSICAL EXAM: ?BP 109/77   Pulse 99   Resp 16   Wt 103 lb 12.8 oz (47.1 kg)

## 2021-06-20 NOTE — Telephone Encounter (Signed)
I called patient, CT chest ordered 05/23/2021, CT lung screening ordered from Dr. [name] 11/2020, patient will call to schedule centralized scheduling to schedule CT chest from 05/23/2021. ?

## 2021-06-21 ENCOUNTER — Telehealth: Payer: Self-pay | Admitting: Internal Medicine

## 2021-06-21 LAB — BRAIN NATRIURETIC PEPTIDE: BNP: 7.5 pg/mL (ref 0.0–100.0)

## 2021-06-21 NOTE — Telephone Encounter (Signed)
Copied from [place] [id number]. Topic: General - Other >> Jun 20, 2021 12:35 PM Wynetta Emery, [place] C wrote: Reason for CRM: pt called in to update provider. She said that she just called her dentist office and was told that she will be getting Local Anesthesia.

## 2021-06-25 NOTE — Telephone Encounter (Signed)
I have completed her dental form and it will be faxed to her dentist by the [facility]. ?

## 2021-06-27 ENCOUNTER — Other Ambulatory Visit: Payer: Self-pay

## 2021-06-27 ENCOUNTER — Ambulatory Visit
Admission: RE | Admit: 2021-06-27 | Discharge: 2021-06-27 | Disposition: A | Discharge status: Home / Self Care | Payer: Medicare Other | Source: Ambulatory Visit | Attending: Physician Assistant | Admitting: Physician Assistant

## 2021-06-27 DIAGNOSIS — R0989 Other specified symptoms and signs involving the circulatory and respiratory systems: Secondary | ICD-10-CM | POA: Diagnosis present

## 2021-06-27 DIAGNOSIS — F17200 Nicotine dependence, unspecified, uncomplicated: Secondary | ICD-10-CM | POA: Insufficient documentation

## 2021-06-28 NOTE — Progress Notes (Signed)
Left upper lobe nodule noted.  Repeat CT recommended in 6 months for further evaluation.   ? ?Asymmetric left axillary lymph nodes noted. An ultrasound is recommended for further evaluation.  ? ?Findings consistent with emphysema noted. ? ?No findings consistent with ILD.  ? ?Please notify the patient and forward results to her PCP and oncologist.  She will need follow up imaging and an ultrasound.

## 2021-07-01 ENCOUNTER — Telehealth: Payer: Self-pay | Admitting: Internal Medicine

## 2021-07-01 DIAGNOSIS — R590 Localized enlarged lymph nodes: Secondary | ICD-10-CM

## 2021-07-01 DIAGNOSIS — R911 Solitary pulmonary nodule: Secondary | ICD-10-CM

## 2021-07-01 DIAGNOSIS — I700 Atherosclerosis of aorta: Secondary | ICD-10-CM

## 2021-07-01 DIAGNOSIS — Z853 Personal history of malignant neoplasm of breast: Secondary | ICD-10-CM

## 2021-07-01 NOTE — Telephone Encounter (Signed)
Copied from [place]. Topic: General - Inquiry ?>> Jul 01, 2021  1:56 PM Loma [place] wrote: ?Reason for CRM: Pt has had lung x-R and scans done and was told while trying to FU with the results that her PCP would provide. Pt is very anxious and wants a FU call re results from Dr Lenna Sciara. FU at [phone] ?

## 2021-07-01 NOTE — Telephone Encounter (Signed)
Patient was notified of message from CT scan that was ordered by another provider.  ?The results were delivered to patient on 06/28/2021 by Francis Gaines  ? ?She states Dr. Wynetta Emery will need to f/u with her.  ?

## 2021-07-02 ENCOUNTER — Other Ambulatory Visit (HOSPITAL_COMMUNITY): Payer: Self-pay

## 2021-07-02 ENCOUNTER — Other Ambulatory Visit: Payer: Self-pay | Admitting: Rheumatology

## 2021-07-02 ENCOUNTER — Other Ambulatory Visit: Payer: Self-pay

## 2021-07-02 DIAGNOSIS — I700 Atherosclerosis of aorta: Secondary | ICD-10-CM

## 2021-07-02 MED ORDER — PREDNISONE 5 MG PO TABS
5.0000 mg | ORAL_TABLET | Freq: Every day | ORAL | 0 refills | Status: DC
  Filled 2021-07-02: qty 30, 30d supply, fill #0

## 2021-07-02 NOTE — Progress Notes (Signed)
Spoke with patient stated she had a call from her PCP and no further instructions needed. ?

## 2021-07-02 NOTE — Telephone Encounter (Signed)
Patient left a voicemail requesting a refill of prednisone '5mg'$ . Patient states she is not out of medication yet but she doesn't have any refills. [facility] [place]. ?

## 2021-07-02 NOTE — Telephone Encounter (Signed)
PC placed to pt this a.m to go over results of HRCT chest that was done through her rheumatologist.  Patient informed that this revealed several incidental findings. ?- Left upper lobe lung nodule measuring 7 x 6 mm in size. ?Patient advised that she is at high risk for lung cancer given history of cigarette smoking.  Patient also with known history of breast cancer.  Radiologist recommended repeat scan in 6 months.  Advised patient that we will plan to repeat this scan in 6 months to see if there is any changes in size.  I have referred her to the pulmonologist on last visit for which she is still awaiting an appointment.  Advised that she also brings this to the attention of her pulmonologist when she is seen.  Should also bring this to the attention of her oncologist. ? ?-Asymmetric left subcentimeter axillary lymph nodes.  Radiologist recommended left axillary ultrasound for further evaluation. ?Patient informed that this is concerning given her history of breast CA in the left breast.  She has had some lymph nodes removed from the left axilla during treatment of her breast cancer in 2021. ?Last saw her Dr. [name] 11/2020.  He has since retired.  He referred her to Dr. Fulton Reek but patient no showed her appointment 05/08/2021. ?Advised patient to call and reschedule her appointment with the oncologist as soon as possible.  She tells me she will do so today.  I will also send a message to this oncologist. ? ?Findings of emphysema and bronchitis: Patient reports significant improvement in her respiratory symptoms having started the Incruse inhaler.  States she is not as short of breath and not waking up at night coughing as much.  Awaits appointment with pulmonary. ? ?Finding of aortic atherosclerosis: Advised that this indicates plaque buildup in the main blood vessel that pumps blood from the heart to other parts of the body.  This may be indicative of plaque buildup around the arteries of the heart as  well.  Very important that she discontinue smoking.  Lipid profile ordered. ? ?Patient expressed understanding of the plan.  All questions were answered. ? ? ? ?

## 2021-07-02 NOTE — Telephone Encounter (Signed)
Next Visit: 08/28/2021 ? ?Last Visit: 05/22/2021 ? ?Last Fill: 05/22/2021 ? ?Dx: Rheumatoid arthritis involving multiple sites with positive rheumatoid factor  ? ?Current Dose per office note on 05/22/2021: prednisone 5 mg 1 tablet daily  ? ?Okay to refill Prednisone?   ?

## 2021-07-12 ENCOUNTER — Inpatient Hospital Stay: Payer: Medicare Other | Attending: Internal Medicine | Admitting: Internal Medicine

## 2021-07-12 ENCOUNTER — Encounter: Payer: Self-pay | Admitting: Internal Medicine

## 2021-07-12 ENCOUNTER — Inpatient Hospital Stay: Payer: Medicare Other

## 2021-07-12 DIAGNOSIS — R911 Solitary pulmonary nodule: Secondary | ICD-10-CM | POA: Diagnosis not present

## 2021-07-12 DIAGNOSIS — Z79810 Long term (current) use of selective estrogen receptor modulators (SERMs): Secondary | ICD-10-CM | POA: Insufficient documentation

## 2021-07-12 DIAGNOSIS — M069 Rheumatoid arthritis, unspecified: Secondary | ICD-10-CM | POA: Insufficient documentation

## 2021-07-12 DIAGNOSIS — Z87891 Personal history of nicotine dependence: Secondary | ICD-10-CM | POA: Insufficient documentation

## 2021-07-12 DIAGNOSIS — Z79899 Other long term (current) drug therapy: Secondary | ICD-10-CM | POA: Insufficient documentation

## 2021-07-12 DIAGNOSIS — F32A Depression, unspecified: Secondary | ICD-10-CM | POA: Insufficient documentation

## 2021-07-12 DIAGNOSIS — Z170 Estrogen receptor positive status [ER+]: Secondary | ICD-10-CM | POA: Insufficient documentation

## 2021-07-12 DIAGNOSIS — F419 Anxiety disorder, unspecified: Secondary | ICD-10-CM | POA: Insufficient documentation

## 2021-07-12 DIAGNOSIS — C50412 Malignant neoplasm of upper-outer quadrant of left female breast: Secondary | ICD-10-CM | POA: Insufficient documentation

## 2021-07-12 NOTE — Progress Notes (Signed)
New patient evaluation.   

## 2021-07-12 NOTE — Progress Notes (Signed)
[facility] ?CONSULT NOTE ? ?Patient Care Team: ?Ladell Pier, MD as PCP - General (Internal Medicine) ?[name], MD as Consulting Physician (General Surgery) ?Bo Merino, MD as Consulting Physician (Rheumatology) ?Beverely Pace, LCSW (Inactive) as Education officer, museum Hotel manager) ?Ofilia Neas, PA-C as Physician Assistant (Physician Assistant) ?Cammie Sickle, MD as Consulting Physician (Oncology) ? ?CHIEF COMPLAINTS/PURPOSE OF CONSULTATION: Breast cancer ? ?#  ?Oncology History Overview Note  ?2021- FINAL MICROSCOPIC DIAGNOSIS:  ? ?A. BREAST, LEFT, LUMPECTOMY:  ?- Invasive ductal carcinoma, 1.5 cm.  ?- Ductal carcinoma in situ with calcifications.  ?- Invasive carcinoma 0.4 cm from posterior margin.  ?- DCIS focally involves inferior margin.  ?- Fibrocystic changes with sclerosing adenosis and calcifications.  ?- See oncology table.  ? ?B. LYMPH NODE, LEFT AXILLARY, SENTINEL, BIOPSY:  ?- One lymph node with no metastatic carcinoma (0/1).  ? ?C. LYMPH NODE, LEFT AXILLARY, SENTINEL, BIOPSY:  ?- One lymph node with no metastatic carcinoma (0/1).  ? ?D. LYMPH NODE, LEFT AXILLARY, SENTINEL, BIOPSY:  ?- One lymph node with no metastatic carcinoma (0/1).  ? ?E. LYMPH NODE, LEFT AXILLARY, SENTINEL, BIOPSY:  ?- One lymph node with no metastatic carcinoma (0/1).  ? ?F. BREAST, LEFT ADDITIONAL MEDIAL MARGIN, EXCISION:  ?- Fibrocystic changes with sclerosing adenosis and calcifications.  ?- No malignancy identified.  ?- Final medial margin clear.  ? ?G. BREAST, LEFT ADDITIONAL SUPERIOR MARGIN, EXCISION:  ?- Fibrocystic changes with sclerosing adenosis and calcifications.  ?- No malignancy identified.  ?- Final superior margin clear.  ? ?ONCOLOGY TABLE:  ?INVASIVE CARCINOMA OF THE BREAST:  Resection  ?Procedure: Left breast lumpectomy, 4 left axillary sentinel lymph nodes  ?with additional medial margin and additional superior margin biopsies.  ?Specimen Laterality: Left breast.   ?Tumor Size: 1.5 cm.  ?Histologic Type: Ductal.  ?Histologic Grade:  ?     Glandular (Acinar)/Tubular Differentiation: 1.  ?     Nuclear Pleomorphism: 1.  ?     Mitotic Rate: 1.  ?     Overall Grade: 1  ?Ductal Carcinoma In Situ: Present.  ?Margins: Free of invasive carcinoma.  ?     Distance from closest margin (millimeters): 4 mm.  ?     Specify closest margin (required only if <82m): Posterior.  ?DCIS Margins: Focally involves inferior margin.  ?     Distance from closest margin (millimeters): 0 mm.  ?     Specify closest margin (required only if <161m: Inferior.  ?Regional Lymph Nodes:  ?     Number of Lymph Nodes Examined: 4  ?     Number of Sentinel Nodes Examined (if applicable): 4  ?     Number of Lymph Nodes with Macrometastases (>2 mm): 0  ?     Number of Lymph Nodes with Micrometastases: 0  ?     Number of Lymph Nodes with Isolated Tumor Cells (=0.2 mm or =200  ?cells): 0  ?Treatment Effect:  No known presurgical therapy  ?Breast Biomarker Testing Performed on Previous Biopsy:  ?      Testing Performed on Case Number: [id number].  ?           Estrogen Receptor: 95%, positive, strong staining.  ?           Progesterone Receptor: 2%, positive, strong staining.  ?           HER2: Negative with IHC.  ?  Ki-67: 1%.  ?  ?[name]  ?06/13/2019 Cancer Staging  ? Staging form: Breast, AJCC 8th Edition ?- Clinical stage from 06/13/2019: Stage 0 (cTis (DCIS), cN0, cM0, ER+, PR+) ?  ?06/15/2019 Initial Diagnosis  ? [name] ? ?  ?Malignant neoplasm of upper-outer quadrant of left breast in female, estrogen receptor positive ([place])  ?09/15/2019 Initial Diagnosis  ? Malignant neoplasm of upper-outer quadrant of left breast in female, estrogen receptor positive ([employer]) ?  ? ? ? ?HISTORY OF PRESENTING ILLNESS:  ?[name] 61 y.o.  female with a history of stage I breast cancer ER/PR positive HER2 negative; history of smoking; longstanding  anxiety/depression is here to establish care , since her oncologist retired in [place].   ? ?In the interim patient had a CT scan lung cancer screening that showed 6 mm lung nodule. ?  ?Also incidental imaging CT chest noted to have left-sided axillary first.  Patient is awaiting repeat ultrasound/possible biopsy of the left axillary lymphadenopathy at t breast center. ?  ?Patient states her rheumatoid arthritis is better controlled.  ? ? ?Review of Systems  ?Constitutional:  Positive for malaise/fatigue. Negative for chills, diaphoresis, fever and weight loss.  ?HENT:  Negative for nosebleeds and sore throat.   ?Eyes:  Negative for double vision.  ?Respiratory:  Negative for cough, hemoptysis, sputum production, shortness of breath and wheezing.   ?Cardiovascular:  Negative for chest pain, palpitations, orthopnea and leg swelling.  ?Gastrointestinal:  Negative for abdominal pain, blood in stool, constipation, diarrhea, heartburn, melena, nausea and vomiting.  ?Genitourinary:  Negative for dysuria, frequency and urgency.  ?Musculoskeletal:  Positive for back pain, joint pain and neck pain.  ?Skin: Negative.  Negative for itching and rash.  ?Neurological:  Negative for dizziness, tingling, focal weakness, weakness and headaches.  ?Endo/Heme/Allergies:  Does not bruise/bleed easily.  ?Psychiatric/Behavioral:  Negative for depression. The patient is nervous/anxious. The patient does not have insomnia.    ? ?MEDICAL HISTORY:  ?Past Medical History:  ?Diagnosis Date  ? Anemia   ? during pregnancy  ? Anxiety   ? Asthma   ? Bronchitis age 70  ? Cancer [facility])   ? left breast ca  ? COPD (chronic obstructive pulmonary disease) ([place])   ? Depression   ? Hx of bladder infections   ? been over 5 years since last one   ? Malignant neoplasm of upper-outer quadrant of left breast in female, estrogen receptor positive ([place])   ? Rheumatoid arthritis ([place])   ? unc rheum   ? Tobacco abuse   ? Trouble in sleeping   ? ? ?SURGICAL  HISTORY: ?Past Surgical History:  ?Procedure Laterality Date  ? BREAST LUMPECTOMY WITH RADIOACTIVE SEED AND SENTINEL LYMPH NODE BIOPSY Left 08/30/2019  ? Procedure: LEFT BREAST LUMPECTOMY X 2 WITH RADIOACTIVE SEED AND SENTINEL LYMPH NODE BIOPSY;  Surgeon: [name], MD;  Location: [facility];  Service: General;  Laterality: Left;  PEC BLOCK  ? COLONOSCOPY WITH PROPOFOL N/A 03/15/2019  ? Procedure: COLONOSCOPY WITH PROPOFOL;  Surgeon: Jonathon Bellows, MD;  Location[facility];  Service: Gastroenterology;  Laterality: N/A;  ? HIP ARTHROPLASTY    ? JOINT REPLACEMENT    ? MULTIPLE TOOTH EXTRACTIONS    ? RE-EXCISION OF BREAST LUMPECTOMY Left 10/07/2019  ? Procedure: RE-EXCISION OF LEFT BREAST INFERIOR MARGIN;  Surgeon: [name], MD;  Location: [facility];  Service: General;  Laterality: Left;  ?  TOTAL HIP ARTHROPLASTY Left 02/15/2018  ? Procedure: TOTAL HIP ARTHROPLASTY ANTERIOR APPROACH;  Surgeon: [name], MD;  Location: [facility];  Service: Orthopedics;  Laterality: Left;  ? WISDOM TOOTH EXTRACTION    ? ? ?SOCIAL HISTORY: ?Social History  ? ?Socioeconomic History  ? Marital status: Single  ?  Spouse name: Not on file  ? Number of children: 1  ? Years of education: Not on file  ? Highest education level: Not on file  ?Occupational History  ? Not on file  ?Tobacco Use  ? Smoking status: Former  ?  Packs/day: 0.25  ?  Types: Cigarettes  ?  Quit date: 09/08/2019  ?  Years since quitting: 1.8  ?  Passive exposure: Past  ? Smokeless tobacco: Never  ? Tobacco comments:  ?  on nicotine patches  ?Vaping Use  ? Vaping Use: Never used  ?Substance and Sexual Activity  ? Alcohol use: Yes  ?  Comment: 2 glasses of wine weekly  ? Drug use: Not Currently  ?  Types: Cocaine  ? Sexual activity: Not on file  ?Other Topics Concern  ? Not on file  ?Social History Narrative  ? Not on file  ? ?Social Determinants of Health  ? ?Financial Resource Strain: Medium Risk  ? Difficulty of Paying Living Expenses: Somewhat hard   ?Food Insecurity: No Food Insecurity  ? Worried About Charity fundraiser in the Last Year: Never true  ? Ran Out of Food in the Last Year: Never true  ?Transportation Needs: Unmet Transportation Needs  ? Lack of

## 2021-07-12 NOTE — Assessment & Plan Note (Addendum)
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  Status postlumpectomy followed by radiation.  No chemotherapy.  On adjuvant-tamoxifen 20 mg qhs [NOV, 01/2021].  Patient tolerated tamoxifen well.  Continue same. ? ?#Incidental left axillary lymphadenopathy [on lung cancer screening] -awaiting further work-up including an ultrasound of the left underarm at the breast center on May 17. ? ?#Lung cancer screening: Left upper lobe lung "nodule"-6 mm in size vs mucous plugging.  Agree with recommendation to repeat scan in 6 months.  ? ?#Rheumatoid arthritis: [Dr.Deweshwer; [place]]-methotrexate/; Biologics stable. ? ?#Anxiety/depression: Patient declines any worsening symptoms.  Does not want any psychiatric referral ? ?# screening PAP: will make a referral to [facility] re: PAP smear.  ? ?# DISPOSITION: ?# no labs ?# referral to [facility] [facility] re: PAP smear ?# follow up in 3 months; no labs-- Dr.B ? ?

## 2021-07-19 ENCOUNTER — Encounter: Payer: Self-pay | Admitting: Obstetrics and Gynecology

## 2021-07-19 ENCOUNTER — Other Ambulatory Visit (HOSPITAL_COMMUNITY)
Admission: RE | Admit: 2021-07-19 | Discharge: 2021-07-19 | Disposition: A | Discharge status: Home / Self Care | Payer: Medicare Other | Source: Ambulatory Visit | Attending: Obstetrics and Gynecology | Admitting: Obstetrics and Gynecology

## 2021-07-19 ENCOUNTER — Other Ambulatory Visit (HOSPITAL_COMMUNITY): Payer: Self-pay

## 2021-07-19 ENCOUNTER — Ambulatory Visit (INDEPENDENT_AMBULATORY_CARE_PROVIDER_SITE_OTHER): Payer: Medicare Other | Admitting: Obstetrics and Gynecology

## 2021-07-19 VITALS — BP 99/66 | HR 82 | Ht 65.5 in | Wt 106.8 lb

## 2021-07-19 DIAGNOSIS — Z1151 Encounter for screening for human papillomavirus (HPV): Secondary | ICD-10-CM | POA: Insufficient documentation

## 2021-07-19 DIAGNOSIS — Z01419 Encounter for gynecological examination (general) (routine) without abnormal findings: Secondary | ICD-10-CM

## 2021-07-19 DIAGNOSIS — Z0000 Encounter for general adult medical examination without abnormal findings: Secondary | ICD-10-CM

## 2021-07-19 DIAGNOSIS — Z124 Encounter for screening for malignant neoplasm of cervix: Secondary | ICD-10-CM

## 2021-07-19 NOTE — Progress Notes (Signed)
HPI: ?     [name] is a 61 y.o. [phone] who LMP was No LMP recorded. Patient is postmenopausal. ? ?Subjective:  ? ?She presents today for her annual examination.  She is a breast cancer survivor first diagnosed approximately 2 years ago.  She reports that her treatment is going well and has a high likelihood of successful cure.  She is currently using methotrexate.  She has follow-up breast imaging scheduled in the next 2 weeks. ?She reports no vaginal bleeding or GYN issues at this time. ?She does state that she has quit cigarettes and the last time she smoked a cigarette was greater than 5 months ago. ? ?  Hx: ?The following portions of the patient's history were reviewed and updated as appropriate: ?            She  has a past medical history of Anemia, Anxiety, Asthma, Bronchitis (age 39), Cancer ([place]), COPD (chronic obstructive pulmonary disease) ([place]), Depression, bladder infections, Malignant neoplasm of upper-outer quadrant of left breast in female, estrogen receptor positive ([place]), Rheumatoid arthritis ([place]), Tobacco abuse, and Trouble in sleeping. ?She does not have any pertinent problems on file. ?She  has a past surgical history that includes [place] tooth extraction; Total hip arthroplasty (Left, 02/15/2018); Joint replacement; Colonoscopy with propofol (N/A, 03/15/2019); Hip Arthroplasty; Breast lumpectomy with radioactive seed and sentinel lymph node biopsy (Left, 08/30/2019); Re-excision of breast lumpectomy (Left, 10/07/2019); and Multiple tooth extractions. ?Her family history includes Breast cancer in her maternal great-grandmother; Breast cancer (age of onset: 52) in her mother; Cancer in her maternal grandmother; Cervical cancer in her mother; Healthy in her son; Lung cancer in her brother, maternal uncle, and maternal uncle. ?She  reports that she quit smoking about 22 months ago. Her smoking use included cigarettes. She smoked an average of .25 packs per day. She has been exposed to tobacco  smoke. She has never used smokeless tobacco. She reports current alcohol use. She reports that she does not currently use drugs after having used the following drugs: Cocaine. ?She has a current medication list which includes the following prescription(s): albuterol, calcium carb-cholecalciferol, cyanocobalamin, enbrel mini, folic acid, otrexup, otrexup, multivitamin with minerals, nicotine, prednisone, tamoxifen, and umeclidinium bromide. ?She is allergic to other. ?      ?Review of Systems:  ?Review of Systems ? ?Constitutional: Denied constitutional symptoms, night sweats, recent illness, fatigue, fever, insomnia and weight loss.  ?Eyes: Denied eye symptoms, eye pain, photophobia, vision change and visual disturbance.  ?Ears/Nose/Throat/Neck: Denied ear, nose, throat or neck symptoms, hearing loss, nasal discharge, sinus congestion and sore throat.  ?Cardiovascular: Denied cardiovascular symptoms, arrhythmia, chest pain/pressure, edema, exercise intolerance, orthopnea and palpitations.  ?Respiratory: Denied pulmonary symptoms, asthma, pleuritic pain, productive sputum, cough, dyspnea and wheezing.  ?Gastrointestinal: Denied, gastro-esophageal reflux, melena, nausea and vomiting.  ?Genitourinary: Denied genitourinary symptoms including symptomatic vaginal discharge, pelvic relaxation issues, and urinary complaints.  ?Musculoskeletal: Denied musculoskeletal symptoms, stiffness, swelling, muscle weakness and myalgia.  ?Dermatologic: Denied dermatology symptoms, rash and scar.  ?Neurologic: Denied neurology symptoms, dizziness, headache, neck pain and syncope.  ?Psychiatric: Denied psychiatric symptoms, anxiety and depression.  ?Endocrine: Denied endocrine symptoms including hot flashes and night sweats.  ? ?Meds: ?  ?Current Outpatient Medications on File Prior to Visit  ?Medication Sig Dispense Refill  ? albuterol (VENTOLIN HFA) 108 (90 Base) MCG/ACT inhaler TAKE 2 PUFFS BY MOUTH EVERY 6 HOURS AS NEEDED FOR WHEEZE  OR SHORTNESS OF BREATH 18 each 2  ? Calcium Carb-Cholecalciferol (  CALCIUM 1000 + D PO) Take by mouth.    ? cyanocobalamin 100 MCG tablet Take 100 mcg by mouth daily.    ? Etanercept (ENBREL MINI) 50 MG/ML SOCT INJECT 50 MG INTO THE SKIN ONCE A WEEK. 12 mL 0  ? folic acid (FOLVITE) 456 MCG tablet Take 400 mcg by mouth daily.    ? Methotrexate, PF, (OTREXUP) 20 MG/0.4ML SOAJ INJECT 20 MG INTO THE SKIN ONCE A WEEK. 1.6 mL 0  ? Methotrexate, PF, (OTREXUP) 20 MG/0.4ML SOAJ Inject 20 mg into the skin once a week. 4.8 mL 0  ? Multiple Vitamin (MULTIVITAMIN WITH MINERALS) TABS tablet Take 1 tablet by mouth daily.    ? nicotine (NICODERM CQ - DOSED IN MG/24 HOURS) 14 mg/24hr patch PLACE 1 PATCH ONTO THE SKIN DAILY. 28 patch 1  ? predniSONE (DELTASONE) 5 MG tablet Take 1 tablet by mouth daily with breakfast. 30 tablet 0  ? tamoxifen (NOLVADEX) 20 MG tablet TAKE 1 TABLET BY MOUTH ONCE A DAY 90 tablet 3  ? umeclidinium bromide (INCRUSE ELLIPTA) 62.5 MCG/ACT AEPB Inhale 1 puff into the lungs daily. 30 each 4  ? ?No current facility-administered medications on file prior to visit.  ? ? ? ?Objective:  ?  ? ?Vitals:  ? 07/19/21 1100  ?BP: 99/66  ?Pulse: 82  ?  ?Filed Weights  ? 07/19/21 1100  ?Weight: 106 lb 12.8 oz (48.4 kg)  ? ?  ?         Physical examination ?General NAD, Conversant  ?HEENT Atraumatic; Op clear with mmm.  Normo-cephalic. Pupils reactive. Anicteric sclerae  ?Thyroid/Neck Smooth without nodularity or enlargement. Normal ROM.  Neck Supple.  ?Skin No rashes, lesions or ulceration. Normal palpated skin turgor. No nodularity.  ?Breasts: No masses or discharge.  Symmetric.  No axillary adenopathy.  ?Lungs: Clear to auscultation.No rales or wheezes. Normal Respiratory effort, no retractions.  ?Heart: NSR.  No murmurs or rubs appreciated. No periferal edema  ?Abdomen: Soft.  Non-tender.  No masses.  No HSM. No hernia  ?Extremities: Moves all appropriately.  Normal ROM for age. No lymphadenopathy.  ?Neuro: Oriented to  PPT.  Normal mood. Normal affect.  ? ?  Pelvic:   ?Vulva: Normal appearance.  No lesions.  ?Vagina: No lesions or abnormalities noted.  ?Support: Second-degree cystocele and second-degree uterine prolapse.  ?Urethra No masses tenderness or scarring.  ?Meatus Normal size without lesions or prolapse.  ?Cervix: Normal appearance.  No lesions.  Deviated to the right and anteriorly.  ?Anus: Normal exam.  No lesions.  ?Perineum: Normal exam.  No lesions.  ?      Bimanual   ?Uterus: Normal size.  Non-tender.  Mobile.  AV.  ?Adnexae: No masses.  Non-tender to palpation.  ?Cul-de-sac: Negative for abnormality.  ? ? ? ?Assessment:  ?  ?[id number] ?Patient Active Problem List  ? Diagnosis Date Noted  ? Malignant neoplasm of upper-outer quadrant of left breast in female, estrogen receptor positive ([place]) 09/15/2019  ? 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  ? Adenomatous colon polyp 03/16/2019  ? Degenerative joint disease of shoulder region 06/08/2018  ? Rheumatoid arthritis involving both hands with positive rheumatoid factor ([place]) 06/08/2018  ? Rheumatoid arthritis involving both feet with positive rheumatoid factor ([place]) 06/08/2018  ? 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  ? Status post total hip replacement, 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  ? Smoker 06/08/2018  ? Anxiety and depression 06/08/2018  ? Family history of breast cancer 03/25/2018  ? 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 Rheumatoid arthritis involving multiple sites ([place]) 01/15/2018  ? Tobacco dependence 01/15/2018  ? Opioid use agreement exists 01/15/2018  ? Positive depression screening 01/15/2018  ? Homeless 01/15/2018  ? ?  ?1. Well woman exam with routine gynecological exam   ?2. Screening for cervical cancer   ? ?  ? ? ?Plan:  ?  ?       ? 1. Basic Screening Recommendations ?The basic screening recommendations for asymptomatic women were discussed with the patient during her visit.  The age-appropriate recommendations were discussed with her and the rational for  the tests reviewed.  When I am informed by the patient that another primary care physician has previously obtained the age-appropriate tests and they are up-to-date, only outstanding tests are ordered

## 2021-07-19 NOTE — Progress Notes (Signed)
Patients presents for annual exam today. She states going through personal issues and breast cancer screenings which led her to postpone her cervical cancer screening. She states concerns of stress incontinence, typically when sneezing or coughing. Patient is due for pap smear, ordered. Patient is up to date on labs and mammograms through PCP. Patient states no other questions or concerns at this time.   ?

## 2021-07-23 ENCOUNTER — Other Ambulatory Visit: Payer: Self-pay | Admitting: Internal Medicine

## 2021-07-23 ENCOUNTER — Other Ambulatory Visit (HOSPITAL_COMMUNITY): Payer: Self-pay

## 2021-07-23 DIAGNOSIS — R590 Localized enlarged lymph nodes: Secondary | ICD-10-CM

## 2021-07-23 DIAGNOSIS — Z853 Personal history of malignant neoplasm of breast: Secondary | ICD-10-CM

## 2021-07-23 LAB — CYTOLOGY - PAP
Comment: NEGATIVE
Diagnosis: NEGATIVE
High risk HPV: NEGATIVE

## 2021-07-24 ENCOUNTER — Inpatient Hospital Stay (HOSPITAL_BASED_OUTPATIENT_CLINIC_OR_DEPARTMENT_OTHER): Payer: Medicare Other | Admitting: Hospice and Palliative Medicine

## 2021-07-24 ENCOUNTER — Other Ambulatory Visit: Payer: Medicare Other

## 2021-07-24 ENCOUNTER — Ambulatory Visit: Payer: Medicare Other

## 2021-07-24 DIAGNOSIS — C50412 Malignant neoplasm of upper-outer quadrant of left female breast: Secondary | ICD-10-CM

## 2021-07-24 DIAGNOSIS — Z170 Estrogen receptor positive status [ER+]: Secondary | ICD-10-CM

## 2021-07-24 NOTE — Progress Notes (Signed)
Multidisciplinary Oncology Council Documentation ? [name] was presented by our [facility] on 07/24/2021, which included representatives from:  ?Palliative Care ?Dietitian  ?Physical/Occupational Therapist ?Nurse Navigator ?Genetics ?Speech Therapist ?Social work ?Survivorship RN ?Financial Navigator ?Research RN ? ? ?[name] currently presents with history of breast cancer ? ?We reviewed previous medical and familial history, history of present illness, and recent lab results along with all available histopathologic and imaging studies. The [place] considered available treatment options and made the following recommendations/referrals: ? ?None currently  ? ?The MOC is a meeting of clinicians from various specialty areas who evaluate and discuss patients for whom a multidisciplinary approach is being considered. Final determinations in the plan of care are those of the provider(s).  ? ?Today's extended care, comprehensive team conference, [name] was not present for the discussion and was not examined.  ? ?

## 2021-07-31 ENCOUNTER — Telehealth: Payer: Self-pay | Admitting: Rheumatology

## 2021-07-31 ENCOUNTER — Ambulatory Visit (INDEPENDENT_AMBULATORY_CARE_PROVIDER_SITE_OTHER): Payer: Medicare Other | Admitting: Internal Medicine

## 2021-07-31 ENCOUNTER — Encounter: Payer: Self-pay | Admitting: Internal Medicine

## 2021-07-31 ENCOUNTER — Telehealth: Payer: Self-pay | Admitting: Internal Medicine

## 2021-07-31 DIAGNOSIS — J449 Chronic obstructive pulmonary disease, unspecified: Secondary | ICD-10-CM

## 2021-07-31 DIAGNOSIS — R911 Solitary pulmonary nodule: Secondary | ICD-10-CM | POA: Diagnosis not present

## 2021-07-31 LAB — CBC WITH DIFFERENTIAL/PLATELET
Basophils Absolute: 0.1 10*3/uL (ref 0.0–0.1)
Basophils Relative: 1.2 % (ref 0.0–3.0)
Eosinophils Absolute: 0.2 10*3/uL (ref 0.0–0.7)
Eosinophils Relative: 3.4 % (ref 0.0–5.0)
HCT: 44 % (ref 36.0–46.0)
Hemoglobin: 14.5 g/dL (ref 12.0–15.0)
Lymphocytes Relative: 26.4 % (ref 12.0–46.0)
Lymphs Abs: 1.4 10*3/uL (ref 0.7–4.0)
MCHC: 33 g/dL (ref 30.0–36.0)
MCV: 91.7 fl (ref 78.0–100.0)
Monocytes Absolute: 0.5 10*3/uL (ref 0.1–1.0)
Monocytes Relative: 8.8 % (ref 3.0–12.0)
Neutro Abs: 3.2 10*3/uL (ref 1.4–7.7)
Neutrophils Relative %: 60.2 % (ref 43.0–77.0)
Platelets: 212 10*3/uL (ref 150.0–400.0)
RBC: 4.8 Mil/uL (ref 3.87–5.11)
RDW: 14.7 % (ref 11.5–15.5)
WBC: 5.3 10*3/uL (ref 4.0–10.5)

## 2021-07-31 LAB — LIPID PANEL
Chol/HDL Ratio: 2.5 ratio (ref 0.0–4.4)
Cholesterol, Total: 176 mg/dL (ref 100–199)
HDL: 71 mg/dL (ref >39)
LDL Chol Calc (NIH): 84 mg/dL (ref 0–99)
Triglycerides: 122 mg/dL (ref 0–149)
VLDL Cholesterol Cal: 21 mg/dL (ref 5–40)

## 2021-07-31 NOTE — Assessment & Plan Note (Addendum)
Quit smoking around 03/2021  - HRCT 06/27/21  Mild centrolubular emphysema  -  Labs ordered 07/31/2021  :  allergy profile   alpha one AT phenotype   - 07/31/2021  After extensive coaching inhaler device,  effectiveness =  75% (short ti)   Pt is Group B in terms of symptom/risk and laba/lama therefore appropriate rx at this point >>>  Feels lama alone ok for now but would have a low threshold to change to laba/lama and prefer if cough is the main symptomatic concern then bevespi vs stiolto best choice for maint rx / advise/ plus approp saba   Re SABA :  I spent extra time with pt today reviewing appropriate use of albuterol for prn use on exertion with the following points: 1) saba is for relief of sob that does not improve by walking a slower pace or resting but rather if the pt does not improve after trying this first. 2) If the pt is convinced, as many are, that saba helps recover from activity faster then it's easy to tell if this is the case by re-challenging : ie stop, take the inhaler, then p 5 minutes try the exact same activity (intensity of workload) that just caused the symptoms and see if they are substantially diminished or not after saba 3) if there is an activity that reproducibly causes the symptoms, try the saba 15 min before the activity on alternate days   If in fact the saba really does help, then fine to continue to use it prn but advised may need to look closer at the maintenance regimen being used to achieve better control of airways disease with exertion.

## 2021-07-31 NOTE — Patient Instructions (Signed)
Plan A = Automatic = Always=    Incruse one click each am then rinse and gargle   Plan B = Backup (to supplement plan A, not to replace it) Only use your albuterol inhaler as a rescue medication to be used if you can't catch your breath by resting or doing a relaxed purse lip breathing pattern.  - The less you use it, the better it will work when you need it. - Ok to use the inhaler up to 2 puffs  every 4 hours if you must but call for appointment if use goes up over your usual need - Don't leave home without it !!  (think of it like the spare tire for your car)     Please remember to go to the lab department   for your tests - we will call you with the results when they are available.      PFT next availabe and I will call results and will set up follow up

## 2021-07-31 NOTE — Progress Notes (Signed)
[name], female    DOB: May 18, 1960   MRN: [id number]   Brief patient profile:  54  yowf  with RA Quit smoking around 03/2021 referred to pulmonary clinic in [facility]  07/31/2021 by Karle Plumber MD for  SPN in setting of recurrent bronchitis in her 53s and around 2018 more daily symptoms > started symbicort 1st around 20022 and then incruse 06/2021      History of Present Illness  07/31/2021  Pulmonary/ 1st office eval/ [name] / [place] [facility]  RA on Prednisone 5 mg  2-3/ weeks  Chief Complaint  Patient presents with   Consult    Had Lung Scan, sob, cough-clear, Incruse has helped  Dyspnea:  does water aerobics, trouble with steps, does ok flat nl pace  Cough: still having coughing fits  Sleep: bed is flat / sleeps on side  SABA use: once or twice a daily seem to help  02 none  Covid 19 x 2 vax  Feels like mtx/embrel helping arthritic symptoms  No obvious day to day or daytime variability or assoc excess/ purulent sputum or mucus plugs or hemoptysis or cp or chest tightness, subjective wheeze or overt sinus or hb symptoms.   Sleeping  without nocturnal  or early am exacerbation  of respiratory  c/o's or need for noct saba. Also denies any obvious fluctuation of symptoms with weather or environmental changes or other aggravating or alleviating factors except as outlined above   No unusual exposure hx or h/o childhood pna/ asthma or knowledge of premature birth.  Current Allergies, Complete Past Medical History, Past Surgical History, Family History, and Social History were reviewed in [employer] record.  ROS  The following are not active complaints unless bolded Hoarseness, sore throat, dysphagia, dental problems, itching, sneezing,  nasal congestion or discharge of excess mucus or purulent secretions, ear ache,   fever, chills, sweats, unintended wt loss or wt gain, classically pleuritic or exertional cp,  orthopnea pnd or arm/hand swelling  or leg  swelling, presyncope, palpitations, abdominal pain, anorexia, nausea, vomiting, diarrhea  or change in bowel habits or change in bladder habits, change in stools or change in urine, dysuria, hematuria,  rash, arthralgias, visual complaints, headache, numbness, weakness or ataxia or problems with walking or coordination,  change in mood or  memory.                Past Medical History:  Diagnosis Date   Anemia    during pregnancy   Anxiety    Asthma    Bronchitis age 85   Cancer ([place])    left breast ca   COPD (chronic obstructive pulmonary disease) (HCC)    Depression    Hx of bladder infections    been over 5 years since last one    Malignant neoplasm of upper-outer quadrant of left breast in female, estrogen receptor positive ([place])    Rheumatoid arthritis ([place])    unc rheum    Tobacco abuse    Trouble in sleeping     Outpatient Medications Prior to Visit  Medication Sig Dispense Refill   albuterol (VENTOLIN HFA) 108 (90 Base) MCG/ACT inhaler TAKE 2 PUFFS BY MOUTH EVERY 6 HOURS AS NEEDED FOR WHEEZE OR SHORTNESS OF BREATH 18 each 2   Calcium Carb-Cholecalciferol (CALCIUM 1000 + D PO) Take by mouth.     cyanocobalamin 100 MCG tablet Take 100 mcg by mouth daily.     Etanercept (ENBREL MINI) 50 MG/ML SOCT INJECT 50  MG INTO THE SKIN ONCE A WEEK. 12 mL 0   folic acid (FOLVITE) 716 MCG tablet Take 400 mcg by mouth daily.     Methotrexate, PF, (OTREXUP) 20 MG/0.4ML SOAJ Inject 20 mg into the skin once a week. 4.8 mL 0   Multiple Vitamin (MULTIVITAMIN WITH MINERALS) TABS tablet Take 1 tablet by mouth daily.     nicotine (NICODERM CQ - DOSED IN MG/24 HOURS) 14 mg/24hr patch PLACE 1 PATCH ONTO THE SKIN DAILY. 28 patch 1   predniSONE (DELTASONE) 5 MG tablet Take 1 tablet by mouth daily with breakfast. 30 tablet 0   tamoxifen (NOLVADEX) 20 MG tablet TAKE 1 TABLET BY MOUTH ONCE A DAY 90 tablet 3   umeclidinium bromide (INCRUSE ELLIPTA) 62.5 MCG/ACT AEPB Inhale 1 puff into the lungs daily.  30 each 4   Methotrexate, PF, (OTREXUP) 20 MG/0.4ML SOAJ INJECT 20 MG INTO THE SKIN ONCE A WEEK. 1.6 mL 0   No facility-administered medications prior to visit.     Objective:     BP 110/64 (BP Location: Left Arm, Cuff Size: Normal)   Pulse 82   Temp 99.1 F (37.3 C) (Temporal)   Ht 5' 5.5" (1.664 m)   Wt 107 lb 9.6 oz (48.8 kg)   BMI 17.63 kg/m   SpO2:  (ra)  Amb wf nad   HEENT : Oropharynx  clear/poor dentition   Nasal turbintes nl   NECK :  without  apparent JVD/ palpable Nodes/TM    LUNGS: no acc muscle use,  Min barrel  contour chest wall with bilateral  slightly decreased bs s audible wheeze and  without cough on insp or exp maneuvers and min  Hyperresonant  to  percussion bilaterally    CV:  RRR  no s3 or murmur or increase in P2, and no edema   ABD:  soft and nontender with pos end  insp Hoover's  in the supine position.  No bruits or organomegaly appreciated   MS:  Nl gait/ ext warm without deformities Or obvious joint restrictions  calf tenderness, cyanosis or clubbing     SKIN: warm and dry without lesions    NEURO:  alert, approp, nl sensorium with  no motor or cerebellar deficits apparent.            . I personally reviewed images and agree with radiology impression as follows:   Chest CT  06/27/21  1. Left upper lobe nodule measuring 6.5 mm in mean diameter with central lucency which is likely due to associated bronchus. Recommend follow-up chest CT in 6 months for further evaluation. 2. Mild bilateral bronchial wall thickening, findings can be seen in the setting of bronchitis, likely related to COPD. 3. Asymmetric left subcentimeter axillary lymph nodes. Recommend left axillary ultrasound for further evaluation. 4. Aortic Atherosclerosis (ICD10-I70.0) and Emphysema (ICD10-J43.9).    Assessment   COPD GOLD ?  Quit smoking around 03/2021  - HRCT 06/27/21  Mild centrolubular emphysema  -  Labs ordered 07/31/2021  :  allergy profile   alpha one  AT phenotype   - 07/31/2021  After extensive coaching inhaler device,  effectiveness =  75% (short ti)   Pt is Group B in terms of symptom/risk and laba/lama therefore appropriate rx at this point >>>  Feels lama alone ok for now but would have a low threshold to change to laba/lama and prefer if cough is the main symptomatic concern then bevespi vs stiolto best choice for maint rx / advise/ plus  approp saba   Re SABA :  I spent extra time with pt today reviewing appropriate use of albuterol for prn use on exertion with the following points: 1) saba is for relief of sob that does not improve by walking a slower pace or resting but rather if the pt does not improve after trying this first. 2) If the pt is convinced, as many are, that saba helps recover from activity faster then it's easy to tell if this is the case by re-challenging : ie stop, take the inhaler, then p 5 minutes try the exact same activity (intensity of workload) that just caused the symptoms and see if they are substantially diminished or not after saba 3) if there is an activity that reproducibly causes the symptoms, try the saba 15 min before the activity on alternate days   If in fact the saba really does help, then fine to continue to use it prn but advised may need to look closer at the maintenance regimen being used to achieve better control of airways disease with exertion.       Solitary pulmonary nodule on lung CT Chest CT  06/27/21  Left upper lobe nodule measuring 6.5 mm in mean diameter with central lucency which is likely due to associated bronchus. - rec CT chest 12/27/21 >>>  Note RA on immunosuppressives   with smoking hx so could be benign or early tumor or sign of MAI . In any case CT results reviewed with pt >>> Too small for PET or bx, not suspicious enough for excisional bx > really only option for now is follow the Fleischner society guidelines as rec by radiology.   >>  Discussed in detail all the   indications, usual  risks and alternatives  relative to the benefits with patient who agrees to proceed with w/u as outlined.            Each maintenance medication was reviewed in detail including emphasizing most importantly the difference between maintenance and prns and under what circumstances the prns are to be triggered using an action plan format where appropriate.  Total time for H and P, chart review, counseling, reviewing hfa/dpi device(s) and generating customized AVS unique to this office visit / same day charting  > 45 min with pt new to me           Christinia Gully, MD 07/31/2021

## 2021-07-31 NOTE — Telephone Encounter (Signed)
Gerald Stabs from [employer] called the office requesting a verbal for a new Enbrel auto-touch device that the patient reported as damaged. Reference# [id number]

## 2021-07-31 NOTE — Assessment & Plan Note (Addendum)
Chest CT  06/27/21  Left upper lobe nodule measuring 6.5 mm in mean diameter with central lucency which is likely due to associated bronchus. - rec CT chest 12/27/21 >>>  Note RA on immunosuppressives   with smoking hx so could be benign or early tumor or sign of MAI . In any case CT results reviewed with pt >>> Too small for PET or bx, not suspicious enough for excisional bx > really only option for now is follow the Fleischner society guidelines as rec by radiology.   >>  Discussed in detail all the  indications, usual  risks and alternatives  relative to the benefits with patient who agrees to proceed with w/u as outlined.            Each maintenance medication was reviewed in detail including emphasizing most importantly the difference between maintenance and prns and under what circumstances the prns are to be triggered using an action plan format where appropriate.  Total time for H and P, chart review, counseling, reviewing hfa/dpi device(s) and generating customized AVS unique to this office visit / same day charting  > 45 min with pt new to me

## 2021-07-31 NOTE — Telephone Encounter (Signed)
Noted  

## 2021-07-31 NOTE — Telephone Encounter (Signed)
Called the pharmacy and spoke with Denman George to give verbal authorization to replace the Enbrel Autotouch.

## 2021-07-31 NOTE — Telephone Encounter (Signed)
Copied from [place]. Topic: General - Call Back - No Documentation >> Jul 31, 2021  9:05 AM [name] wrote: Reason for CRM: pt wants Dr Wynetta Emery to know she finally went and had her labs done yesterday that the dr ordered.  Please call w/ results

## 2021-08-01 LAB — IGE: IgE (Immunoglobulin E), Serum: 85 kU/L (ref <=114)

## 2021-08-02 ENCOUNTER — Ambulatory Visit
Admission: RE | Admit: 2021-08-02 | Discharge: 2021-08-02 | Disposition: A | Discharge status: Home / Self Care | Payer: Medicare Other | Source: Ambulatory Visit | Attending: Internal Medicine | Admitting: Internal Medicine

## 2021-08-02 ENCOUNTER — Telehealth: Payer: Self-pay | Admitting: Internal Medicine

## 2021-08-02 ENCOUNTER — Telehealth: Payer: Self-pay

## 2021-08-02 DIAGNOSIS — Z853 Personal history of malignant neoplasm of breast: Secondary | ICD-10-CM

## 2021-08-02 DIAGNOSIS — R590 Localized enlarged lymph nodes: Secondary | ICD-10-CM

## 2021-08-02 NOTE — Telephone Encounter (Signed)
Contacted pt to go over lab results pt is aware and doesn't have any questions or concerns 

## 2021-08-02 NOTE — Telephone Encounter (Signed)
Phone call placed to patient today.  Patient advised that I reviewed the results of her mammogram and the ultrasound that was done in the left axilla.  Radiologist stated that "biopsy of the left axillary lymph node may be performed if clinically indicated.  Patient was advised to discuss this with her oncologist.  Patient tells me that she tried to call Dr. Renee Harder office today to discuss but could not get through.  She had seen him earlier this month.  I told her that I will send him a message but she should try calling his office again next week.  Patient was agreeable to this plan.  I subsequently sent him an inbox message requesting that he take a look at patient's recent mammogram report and advise whether biopsy is indicated.

## 2021-08-06 ENCOUNTER — Telehealth: Payer: Self-pay | Admitting: Internal Medicine

## 2021-08-06 NOTE — Telephone Encounter (Signed)
-----   Message from Cammie Sickle, MD sent at 08/05/2021  8:45 PM EDT ----- Regarding: RE: Thank you for reaching out to me, Dr. Wynetta Emery. I will reach out to the patient/ and her surgeon to discuss the next plan of care. GB ----- Message ----- From: Ladell Pier, MD Sent: 08/02/2021   5:23 PM EDT To: Cammie Sickle, MD  Good afternoon.  I am the PCP for this patient whom you saw recently.  I received the results of mammogram and ultrasound of the left axilla that was done today.  Please take a look at the report.  Radiologist recommended that patient discuss with you whether biopsy of the lymph nodes in LT axilla is indicated.

## 2021-08-07 ENCOUNTER — Telehealth: Payer: Self-pay | Admitting: Internal Medicine

## 2021-08-07 ENCOUNTER — Encounter: Payer: Self-pay | Admitting: Internal Medicine

## 2021-08-07 ENCOUNTER — Telehealth: Payer: Self-pay

## 2021-08-07 LAB — ALPHA-1-ANTITRYPSIN PHENOTYP: A-1 Antitrypsin: 186 mg/dL (ref 101–187)

## 2021-08-07 NOTE — Progress Notes (Signed)
Spoke to patient - waiting to speak to patient surgeon. I sent a message couple of days ago; awaiting a callback.  Low clinical suspicion for malignancy in the lymph nodes ; however lymph node biopsy is very reasonable. I'm waiting to speak to surgeon., Dr. [name] at this time.

## 2021-08-07 NOTE — Telephone Encounter (Signed)
Pt called to notify Dr. B that she has her scan complete, spoke with her Primary Dr and would like to see what Dr. B would like to do next. Req to be contacted.Marland KitchenKJ

## 2021-08-07 NOTE — Telephone Encounter (Signed)
LVM for Patient to return call  RE: Schedule AWV with [name] with Shalia Bartko CMA  Please transfer patient when if they return the call  

## 2021-08-07 NOTE — Telephone Encounter (Signed)
Message left for pt

## 2021-08-08 ENCOUNTER — Other Ambulatory Visit (HOSPITAL_COMMUNITY): Payer: Self-pay

## 2021-08-09 ENCOUNTER — Telehealth: Payer: Self-pay | Admitting: Internal Medicine

## 2021-08-09 ENCOUNTER — Ambulatory Visit (INDEPENDENT_AMBULATORY_CARE_PROVIDER_SITE_OTHER): Payer: Medicare Other

## 2021-08-09 DIAGNOSIS — Z0000 Encounter for general adult medical examination without abnormal findings: Secondary | ICD-10-CM | POA: Diagnosis not present

## 2021-08-09 NOTE — Progress Notes (Addendum)
Subjective:   [name] is a 61 y.o. female who presents for Medicare Annual (Subsequent) preventive examination. I discussed the limitations of evaluation and management by telemedicine and the availability of in person appointments. The patient expressed understanding and agreed to proceed.   Visit performed by audio   Patient location: Home  Provider location: Home  Review of Systems    N/A Cardiac Risk Factors include: none     Objective:    There were no vitals filed for this visit. There is no height or weight on file to calculate BMI.     08/09/2021   10:35 AM 07/12/2021   11:10 AM 12/14/2020    9:50 PM 11/21/2019    8:42 AM 10/07/2019    6:22 AM 09/19/2019    3:15 PM 08/26/2019   11:51 AM  Advanced Directives  Does Patient Have a Medical Advance Directive? No No No No No No No  Would patient like information on creating a medical advance directive?  No - Patient declined  No - Patient declined No - Patient declined No - Patient declined No - Patient declined    Current Medications (verified) Outpatient Encounter Medications as of 08/09/2021  Medication Sig   albuterol (VENTOLIN HFA) 108 (90 Base) MCG/ACT inhaler TAKE 2 PUFFS BY MOUTH EVERY 6 HOURS AS NEEDED FOR WHEEZE OR SHORTNESS OF BREATH   Calcium Carb-Cholecalciferol (CALCIUM 1000 + D PO) Take by mouth.   cyanocobalamin 100 MCG tablet Take 100 mcg by mouth daily.   Etanercept (ENBREL MINI) 50 MG/ML SOCT INJECT 50 MG INTO THE SKIN ONCE A WEEK.   Methotrexate, PF, (OTREXUP) 20 MG/0.4ML SOAJ INJECT 20 MG INTO THE SKIN ONCE A WEEK.   Multiple Vitamin (MULTIVITAMIN WITH MINERALS) TABS tablet Take 1 tablet by mouth daily.   nicotine (NICODERM CQ - DOSED IN MG/24 HOURS) 14 mg/24hr patch PLACE 1 PATCH ONTO THE SKIN DAILY.   predniSONE (DELTASONE) 5 MG tablet Take 1 tablet by mouth daily with breakfast. (Patient taking differently: Take 5 mg by mouth daily with breakfast. As needed)   tamoxifen (NOLVADEX) 20 MG tablet  TAKE 1 TABLET BY MOUTH ONCE A DAY   umeclidinium bromide (INCRUSE ELLIPTA) 62.5 MCG/ACT AEPB Inhale 1 puff into the lungs daily.   [DISCONTINUED] folic acid (FOLVITE) 557 MCG tablet Take 400 mcg by mouth daily. (Patient not taking: Reported on 08/09/2021)   [DISCONTINUED] Methotrexate, PF, (OTREXUP) 20 MG/0.4ML SOAJ Inject 20 mg into the skin once a week. (Patient not taking: Reported on 08/09/2021)   No facility-administered encounter medications on file as of 08/09/2021.    Allergies (verified) Other   History: Past Medical History:  Diagnosis Date   Anemia    during pregnancy   Anxiety    Asthma    Bronchitis age 22   Cancer ([place])    left breast ca   COPD (chronic obstructive pulmonary disease) (HCC)    Depression    Hx of bladder infections    been over 5 years since last one    Malignant neoplasm of upper-outer quadrant of left breast in female, estrogen receptor positive ([place])    Rheumatoid arthritis ([place])    unc rheum    Tobacco abuse    Trouble in sleeping    Past Surgical History:  Procedure Laterality Date   BREAST LUMPECTOMY WITH RADIOACTIVE SEED AND SENTINEL LYMPH NODE BIOPSY Left 08/30/2019   Procedure: LEFT BREAST LUMPECTOMY X 2 WITH RADIOACTIVE SEED AND SENTINEL LYMPH NODE BIOPSY;  Surgeon:  [name], MD;  Location: [place];  Service: General;  Laterality: Left;  PEC BLOCK   COLONOSCOPY WITH PROPOFOL N/A 03/15/2019   Procedure: COLONOSCOPY WITH PROPOFOL;  Surgeon: Jonathon Bellows, MD;  Location: [facility] ENDOSCOPY;  Service: Gastroenterology;  Laterality: N/A;   HIP ARTHROPLASTY     JOINT REPLACEMENT     MULTIPLE TOOTH EXTRACTIONS     RE-EXCISION OF BREAST LUMPECTOMY Left 10/07/2019   Procedure: RE-EXCISION OF LEFT BREAST INFERIOR MARGIN;  Surgeon: [name], MD;  Location: [place];  Service: General;  Laterality: Left;   TOTAL HIP ARTHROPLASTY Left 02/15/2018   Procedure: TOTAL HIP ARTHROPLASTY ANTERIOR APPROACH;  Surgeon: [name], MD;  Location:  [facility];  Service: Orthopedics;  Laterality: Left;   WISDOM TOOTH EXTRACTION     Family History  Problem Relation Age of Onset   Breast cancer Mother 61       metastatic   Cervical cancer Mother    Lung cancer Brother        maternal half brother; d. 4s   Lung cancer Maternal Uncle        d. 72s   Lung cancer Maternal Uncle        d. 64   Cancer Maternal Grandmother        unknown type; dx 48s   Healthy Son    Breast cancer Maternal Great-grandmother        dx unknown age   Social History   Socioeconomic History   Marital status: Single    Spouse name: Not on file   Number of children: 1   Years of education: 14 years   Highest education level: Some college, no degree  Occupational History   Not on file  Tobacco Use   Smoking status: Some Days    Packs/day: 0.25    Types: Cigarettes    Last attempt to quit: 09/08/2019    Years since quitting: 1.9    Passive exposure: Past   Smokeless tobacco: Never   Tobacco comments:    on nicotine patches  Vaping Use   Vaping Use: Never used  Substance and Sexual Activity   Alcohol use: Yes    Comment: 2 glasses of wine weekly   Drug use: Not Currently    Types: Cocaine   Sexual activity: Not Currently    Birth control/protection: Post-menopausal  Other Topics Concern   Not on file  Social History Narrative   Not on file   Social Determinants of Health   Financial Resource Strain: Medium Risk   Difficulty of Paying Living Expenses: Somewhat hard  Food Insecurity: No Food Insecurity   Worried About Charity fundraiser in the Last Year: Never true   Ran Out of Food in the Last Year: Never true  Transportation Needs: No Transportation Needs   Lack of Transportation (Medical): No   Lack of Transportation (Non-Medical): No  Physical Activity: Insufficiently Active   Days of Exercise per Week: 4 days   Minutes of Exercise per Session: 30 min  Stress: Stress Concern Present   Feeling of Stress : To some extent  Social  Connections: Socially Isolated   Frequency of Communication with Friends and Family: Once a week   Frequency of Social Gatherings with Friends and Family: Twice a week   Attends Religious Services: Never   Marine scientist or Organizations: No   Attends Archivist Meetings: Never   Marital Status: Divorced    Tobacco Counseling  Ready to quit: Not Answered Counseling given: Not Answered Tobacco comments: on nicotine patches   Clinical Intake:  Pre-visit preparation completed: Yes  Pain : No/denies pain     Diabetes: No  How often do you need to have someone help you when you read instructions, pamphlets, or other written materials from your doctor or pharmacy?: 1 - Never What is the last grade level you completed in school?: 2 years of college  Diabetic?No  Interpreter Needed?: No  Information entered by :: [facility] [place] CMA   Activities of Daily Living    08/09/2021   10:36 AM  In your present state of health, do you have any difficulty performing the following activities:  Hearing? 0  Vision? 1  Difficulty concentrating or making decisions? 0  Walking or climbing stairs? 0  Dressing or bathing? 0  Doing errands, shopping? 0  Preparing Food and eating ? N  Using the Toilet? N  In the past six months, have you accidently leaked urine? N  Do you have problems with loss of bowel control? N  Managing your Medications? N  Managing your Finances? N  Housekeeping or managing your Housekeeping? N    Patient Care Team: Ladell Pier, MD as PCP - General (Internal Medicine) [name], MD as Consulting Physician (General Surgery) Bo Merino, MD as Consulting Physician (Rheumatology) Beverely Pace, LCSW (Inactive) as Social Worker ([place]) Su Monks as Physician Assistant (Physician Assistant) Cammie Sickle, MD as Consulting Physician (Oncology)  Indicate any recent Medical Services you may have  received from other than [facility] providers in the past year (date may be approximate).     Assessment:   This is a routine wellness examination for Diva.  Hearing/Vision screen No results found.  Dietary issues and exercise activities discussed: Current Exercise Habits: Structured exercise class   Goals Addressed   None    Depression Screen    08/09/2021   10:32 AM 06/20/2021   10:50 AM 05/27/2021    9:29 AM 12/18/2020    2:25 PM 04/01/2019    2:45 PM 11/02/2018    2:35 PM 06/22/2018    3:13 PM  PHQ 2/9 Scores  PHQ - 2 Score 0 '2 1 5 '$ 0 2 3  PHQ- 9 Score  '10 3 20  7 9    '$ Fall Risk    08/09/2021   10:36 AM 06/20/2021   10:50 AM 06/22/2018    3:08 PM  [place] in the past year? 0 0 0  Number falls in past yr: 0 0   Injury with Fall? 0 0   Risk for fall due to : No Fall Risks No Fall Risks   Follow up Falls evaluation completed      FALL RISK PREVENTION PERTAINING TO THE HOME:  Any stairs in or around the home? Yes  If so, are there any without handrails? No  Home free of loose throw rugs in walkways, pet beds, electrical cords, etc? Yes  Adequate lighting in your home to reduce risk of falls? Yes   ASSISTIVE DEVICES UTILIZED TO PREVENT FALLS:  Life alert? No  Use of a cane, walker or w/c? No  Grab bars in the bathroom? No  Shower chair or bench in shower? No  Elevated toilet seat or a handicapped toilet? No   TIMED UP AND GO:  Was the test performed? No .  Length of time to ambulate 10 feet: 0 sec.  Cognitive Function:        08/09/2021   10:37 AM  6CIT Screen  What Year? 0 points  What month? 0 points  What time? 0 points  Count back from 20 0 points  Months in reverse 0 points  Repeat phrase 0 points  Total Score 0 points    Immunizations Immunization History  Administered Date(s) Administered   Influenza,inj,Quad PF,6+ Mos 12/18/2020   PFIZER(Purple Top)SARS-COV-2 Vaccination 06/27/2019, 07/19/2019   Pneumococcal Conjugate-13  07/14/2018   Tdap 03/25/2018   Zoster Recombinat (Shingrix) 07/14/2018    TDAP status: Up to date  Flu Vaccine status: Up to date  Pneumococcal vaccine status: Up to date  Covid-19 vaccine status: Information provided on how to obtain vaccines.   Qualifies for Shingles Vaccine? Yes   Zostavax completed No   Shingrix Completed?: No.    Education has been provided regarding the importance of this vaccine. Patient has been advised to call insurance company to determine out of pocket expense if they have not yet received this vaccine. Advised may also receive vaccine at local pharmacy or Health Dept. Verbalized acceptance and understanding.  Screening Tests Health Maintenance  Topic Date Due   Zoster Vaccines- Shingrix (2 of 2) 09/08/2018   COVID-19 Vaccine (3 - Pfizer risk series) 08/16/2019   INFLUENZA VACCINE  10/08/2021   COLONOSCOPY (Pts 45-61yr Insurance coverage will need to be confirmed)  03/14/2022   MAMMOGRAM  11/22/2022   PAP SMEAR-Modifier  07/19/2024   TETANUS/TDAP  03/25/2028   Hepatitis C Screening  Completed   HIV Screening  Completed   HPV VACCINES  Aged Out    Health Maintenance  Health Maintenance Due  Topic Date Due   Zoster Vaccines- Shingrix (2 of 2) 09/08/2018   COVID-19 Vaccine (3 - Pfizer risk series) 08/16/2019    Colorectal cancer screening: Type of screening: Colonoscopy. Completed 2021. Repeat every 3 years  Mammogram status: Completed 11/2020. Repeat every year   Lung Cancer Screening: (Low Dose CT Chest recommended if Age 63-80years, 30 pack-year currently smoking OR have quit w/in 15years.) does qualify.   Lung Cancer Screening Referral: No patient just had CT  Additional Screening:  Hepatitis C Screening: does not qualify; Completed No  Vision Screening: Recommended annual ophthalmology exams for early detection of glaucoma and other disorders of the eye. Is the patient up to date with their annual eye exam?  No  Who is the  provider or what is the name of the office in which the patient attends annual eye exams? N/a If pt is not established with a provider, would they like to be referred to a provider to establish care? No .   Dental Screening: Recommended annual dental exams for proper oral hygiene  Community Resource Referral / Chronic Care Management: CRR required this visit?  No   CCM required this visit?  No      Plan:     I have personally reviewed and noted the following in the patient's chart:   Medical and social history Use of alcohol, tobacco or illicit drugs  Current medications and supplements including opioid prescriptions.  Functional ability and status Nutritional status Physical activity Advanced directives List of other physicians Hospitalizations, surgeries, and ER visits in previous 12 months Vitals Screenings to include cognitive, depression, and falls Referrals and appointments  In addition, I have reviewed and discussed with patient certain preventive protocols, quality metrics, and best practice recommendations. A written personalized care plan for preventive services as well as  general preventive health recommendations were provided to patient.    Ms. Witman , Thank you for taking time to come for your Medicare Wellness Visit. I appreciate your ongoing commitment to your health goals. Please review the following plan we discussed and let me know if I can assist you in the future.   These are the goals we discussed:  Goals   None     This is a list of the screening recommended for you and due dates:  Health Maintenance  Topic Date Due   Zoster (Shingles) Vaccine (2 of 2) 09/08/2018   COVID-19 Vaccine (3 - Pfizer risk series) 08/16/2019   Flu Shot  10/08/2021   Colon Cancer Screening  03/14/2022   Mammogram  11/22/2022   Pap Smear  07/19/2024   Tetanus Vaccine  03/25/2028   Hepatitis C Screening: USPSTF Recommendation to screen - Ages 18-79 yo.  Completed   HIV  Screening  Completed   HPV Vaccine  Aged [address] [place]   08/09/2021     I have reviewed and agreed to the above documentation.   Theresia Lo, FNP

## 2021-08-09 NOTE — Telephone Encounter (Signed)
Tried calling the pt and there was no answer- LMTCB.  

## 2021-08-12 ENCOUNTER — Telehealth: Payer: Self-pay | Admitting: *Deleted

## 2021-08-12 ENCOUNTER — Other Ambulatory Visit: Payer: Self-pay | Admitting: Internal Medicine

## 2021-08-12 DIAGNOSIS — R590 Localized enlarged lymph nodes: Secondary | ICD-10-CM

## 2021-08-12 DIAGNOSIS — Z170 Estrogen receptor positive status [ER+]: Secondary | ICD-10-CM

## 2021-08-12 NOTE — Telephone Encounter (Signed)
Patient is returning phone call. Patient phone number is [phone].

## 2021-08-12 NOTE — Telephone Encounter (Signed)
Patient called asking if Dr B has spoken to Dr [name] regarding her abnormal lymph nodes

## 2021-08-13 ENCOUNTER — Other Ambulatory Visit: Payer: Self-pay | Admitting: Internal Medicine

## 2021-08-13 ENCOUNTER — Other Ambulatory Visit: Payer: Self-pay | Admitting: *Deleted

## 2021-08-13 ENCOUNTER — Encounter: Payer: Self-pay | Admitting: *Deleted

## 2021-08-13 ENCOUNTER — Other Ambulatory Visit (HOSPITAL_COMMUNITY): Payer: Self-pay

## 2021-08-13 DIAGNOSIS — Z170 Estrogen receptor positive status [ER+]: Secondary | ICD-10-CM

## 2021-08-13 DIAGNOSIS — C50412 Malignant neoplasm of upper-outer quadrant of left female breast: Secondary | ICD-10-CM

## 2021-08-13 DIAGNOSIS — R590 Localized enlarged lymph nodes: Secondary | ICD-10-CM

## 2021-08-13 MED ORDER — DIAZEPAM 2 MG PO TABS: ORAL_TABLET | ORAL | 0 refills | Status: DC

## 2021-08-13 NOTE — Telephone Encounter (Signed)
Dr. B has communicated with Dr. [name] and he does recommend patient proceed with biopsy of left axillary Lymph nodes.  Dr. B has entered the order and requested breast navigator Ana to inform patient and schedule bx with MD f/u 3-4 days after bx-no labs.

## 2021-08-13 NOTE — Telephone Encounter (Signed)
Spoke with pt who states pcp placed her on incruse for chronic cough but did state that if the cough did not get better it may not be the correct medication for her and to notify Dr. Melvyn Novas. Pt states cough has not gotten any better. Dr. Melvyn Novas please advise.

## 2021-08-14 MED ORDER — BEVESPI AEROSPHERE 9-4.8 MCG/ACT IN AERO: 2.0000 | INHALATION_SPRAY | Freq: Two times a day (BID) | RESPIRATORY_TRACT | 5 refills | Status: DC

## 2021-08-14 NOTE — Progress Notes (Signed)
Dr. Jacinto Reap and Dr. [name] recommended a biopsy of axillary lymph nodes.   Appt. Set up for biopsy 6/12 and follow up with Dr. Jacinto Reap on 6/16.   Patient is aware of appt. Information.

## 2021-08-14 NOTE — Telephone Encounter (Signed)
Called and spoke with pt letting her know recs per MW and she verbalized understanding. Rx for [name] has been sent to preferred pharmacy for pt. Nothing further needed.

## 2021-08-14 NOTE — Telephone Encounter (Signed)
Try stop incruse and rx Bevespi Take 2 puffs first thing in am and then another 2 puffs about 12 hours later and will need f/u next available in [place]   Can also use albuterol up to every 4 h prn in short run to see if helps if can't get the bevespi on her insurance

## 2021-08-16 ENCOUNTER — Other Ambulatory Visit: Payer: Self-pay | Admitting: Physician Assistant

## 2021-08-16 ENCOUNTER — Other Ambulatory Visit (HOSPITAL_COMMUNITY): Payer: Self-pay

## 2021-08-19 ENCOUNTER — Ambulatory Visit
Admission: RE | Admit: 2021-08-19 | Discharge: 2021-08-19 | Disposition: A | Discharge status: Home / Self Care | Payer: Medicare Other | Source: Ambulatory Visit | Attending: Internal Medicine | Admitting: Internal Medicine

## 2021-08-19 ENCOUNTER — Other Ambulatory Visit: Payer: Self-pay | Admitting: *Deleted

## 2021-08-19 DIAGNOSIS — Z170 Estrogen receptor positive status [ER+]: Secondary | ICD-10-CM

## 2021-08-19 DIAGNOSIS — R590 Localized enlarged lymph nodes: Secondary | ICD-10-CM

## 2021-08-19 DIAGNOSIS — Z111 Encounter for screening for respiratory tuberculosis: Secondary | ICD-10-CM

## 2021-08-19 DIAGNOSIS — Z79899 Other long term (current) drug therapy: Secondary | ICD-10-CM

## 2021-08-20 NOTE — Progress Notes (Signed)
CBC and CMP WNL

## 2021-08-21 ENCOUNTER — Telehealth: Payer: Self-pay | Admitting: Internal Medicine

## 2021-08-21 NOTE — Telephone Encounter (Signed)
Phone call placed to patient today x 2. I left a voicemail message informing her that I got results from the radiologist that they were not successful in biopsying the lymph node in the left axilla.  I see that she has an appointment coming up with her oncologist in 2 days.  I recommend that she discuss with the oncologist the next move.  He may wish to refer her to a breast surgeon to have that biopsy done.  If she would like for me to submit the referral, she can call back and let me know who her breast surgeon is.Marland Kitchen

## 2021-08-22 ENCOUNTER — Telehealth: Payer: Self-pay

## 2021-08-22 ENCOUNTER — Other Ambulatory Visit (HOSPITAL_COMMUNITY): Payer: Self-pay

## 2021-08-22 NOTE — Progress Notes (Unsigned)
Office Visit Note  Patient: [name]             Date of Birth: [birth year]           MRN: [id number]             PCP: Ladell Pier, MD Referring: Ladell Pier, MD Visit Date: 08/28/2021 Occupation: '@GUAROCC'$ @  Subjective:  No chief complaint on file.   History of Present Illness: [name] is a 61 y.o. female ***   Activities of Daily Living:  Patient reports morning stiffness for *** {minute/hour:19697}.   Patient {ACTIONS;DENIES/REPORTS:21021675::"Denies"} nocturnal pain.  Difficulty dressing/grooming: {ACTIONS;DENIES/REPORTS:21021675::"Denies"} Difficulty climbing stairs: {ACTIONS;DENIES/REPORTS:21021675::"Denies"} Difficulty getting out of chair: {ACTIONS;DENIES/REPORTS:21021675::"Denies"} Difficulty using hands for taps, buttons, cutlery, and/or writing: {ACTIONS;DENIES/REPORTS:21021675::"Denies"}  No Rheumatology ROS completed.   PMFS History:  Patient Active Problem List   Diagnosis Date Noted   COPD GOLD ?  07/31/2021   Solitary pulmonary nodule on lung CT 07/31/2021   Malignant neoplasm of upper-outer quadrant of left breast in female, estrogen receptor positive ([place]) 09/15/2019   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   Adenomatous colon polyp 03/16/2019   Degenerative joint disease of shoulder region 06/08/2018   Rheumatoid arthritis involving both hands with positive rheumatoid factor ([place]) 06/08/2018   Rheumatoid arthritis involving both feet with positive rheumatoid factor ([place]) 06/08/2018   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   Status post total hip replacement, 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   Smoker 06/08/2018   Anxiety and depression 06/08/2018   Family history of breast cancer 03/25/2018   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   Rheumatoid arthritis involving multiple sites ([place]) 01/15/2018   Tobacco dependence 01/15/2018   Opioid use agreement exists 01/15/2018   Positive depression screening 01/15/2018   Homeless 01/15/2018     Past Medical History:  Diagnosis Date   Anemia    during pregnancy   Anxiety    Asthma    Bronchitis age 45   Cancer ([place])    left breast ca   COPD (chronic obstructive pulmonary disease) ([place])    Depression    Hx of bladder infections    been over 5 years since last one    Malignant neoplasm of upper-outer quadrant of left breast in female, estrogen receptor positive ([name])    Rheumatoid arthritis ([name])    unc rheum    Tobacco abuse    Trouble in sleeping     Family History  Problem Relation Age of Onset   Breast cancer Mother 60       metastatic   Cervical cancer Mother    Lung cancer Brother        maternal half brother; d. 36s   Lung cancer Maternal Uncle        d. 54s   Lung cancer Maternal Uncle        d. 34   Cancer Maternal Grandmother        unknown type; dx 65s   Healthy Son    Breast cancer Maternal Great-grandmother        dx unknown age   Past Surgical History:  Procedure Laterality Date   BREAST LUMPECTOMY WITH RADIOACTIVE SEED AND SENTINEL LYMPH NODE BIOPSY Left 08/30/2019   Procedure: LEFT BREAST LUMPECTOMY X 2 WITH RADIOACTIVE SEED AND SENTINEL LYMPH NODE BIOPSY;  Surgeon: [name], MD;  Location: [facility];  Service: General;  Laterality: Left;  PEC BLOCK   COLONOSCOPY WITH PROPOFOL N/A 03/15/2019   Procedure: COLONOSCOPY WITH PROPOFOL;  Surgeon: Jonathon Bellows, MD;  Location: [facility];  Service: Gastroenterology;  Laterality: N/A;   HIP ARTHROPLASTY     JOINT REPLACEMENT     MULTIPLE TOOTH EXTRACTIONS     RE-EXCISION OF BREAST LUMPECTOMY Left 10/07/2019   Procedure: RE-EXCISION OF LEFT BREAST INFERIOR MARGIN;  Surgeon: [name], MD;  Location: [facility];  Service: General;  Laterality: Left;   TOTAL HIP ARTHROPLASTY Left 02/15/2018   Procedure: TOTAL HIP ARTHROPLASTY ANTERIOR APPROACH;  Surgeon: [name], MD;  Location: [facility];  Service: Orthopedics;  Laterality: Left;   WISDOM TOOTH EXTRACTION     Social History    Social History Narrative   Not on file   Immunization History  Administered Date(s) Administered   Influenza,inj,Quad PF,6+ Mos 12/18/2020   PFIZER(Purple Top)SARS-COV-2 Vaccination 06/27/2019, 07/19/2019   Pneumococcal Conjugate-13 07/14/2018   Tdap 03/25/2018   Zoster Recombinat (Shingrix) 07/14/2018     Objective: Vital Signs: There were no vitals taken for this visit.   Physical Exam   Musculoskeletal Exam: ***  CDAI Exam: CDAI Score: -- Patient Global: --; Provider Global: -- Swollen: --; Tender: -- Joint Exam 08/28/2021   No joint exam has been documented for this visit   There is currently no information documented on the homunculus. Go to the Rheumatology activity and complete the homunculus joint exam.  Investigation: No additional findings.  Imaging: MM DIAG BREAST TOMO UNI LEFT  Addendum Date: 08/19/2021   ADDENDUM REPORT: 08/19/2021 13:33 ADDENDUM: The patient returned for attempted ultrasound-guided biopsy of the left axilla on 08/19/2021. After multiple attempts of positioning the patient at the time of biopsy, the deep abnormal lymph nodes within the left axilla could not be safely accessed due to deep positioning adjacent to multiple large axillary vessels, and the patient's body habitus and left shoulder arthritis. Therefore, today's procedure was canceled. Options for follow-up include short-term ultrasound follow-up in 2-3 months versus attempted seed localization for excision, though again the latter may prove difficult. Electronically Signed   By: Kristopher Oppenheim M.D.   On: 08/19/2021 13:33   Result Date: 08/19/2021 CLINICAL DATA:  61 year old female presenting for evaluation of possible enlarged left axillary lymph nodes identified on recent chest CT. EXAM: DIGITAL DIAGNOSTIC UNILATERAL LEFT MAMMOGRAM WITH TOMOSYNTHESIS AND CAD; [place] AXILLARY LEFT TECHNIQUE: Left digital diagnostic mammography and breast tomosynthesis was performed. The images were  evaluated with computer-aided detection.; Targeted ultrasound examination of the left axilla was performed. COMPARISON:  Previous exam(s). ACR Breast Density Category c: The breast tissue is heterogeneously dense, which may obscure small masses. FINDINGS: Mammogram: Left breast: Full field tomosynthesis views of the left breast were performed. There are stable postsurgical changes in the posterior superior left breast. No suspicious mass, distortion, or microcalcifications are identified to suggest presence of malignancy. Ultrasound: Targeted ultrasound is performed in the left axilla demonstrating two deep lymph nodes with abnormal cortical thickness measuring up to 0.7 cm. IMPRESSION: Left axillary adenopathy. There are two indeterminate lymph nodes with increased cortical thickening. RECOMMENDATION: 1. Ultrasound-guided core needle biopsy of a left axillary lymph node may be performed if clinically indicated. Patient to discuss with her oncologist. Patient had abnormal left axillary lymph nodes in May 2021 which were not biopsied and subsequent sentinel node evaluation demonstrating no metastasis. 2. Return for routine diagnostic evaluation of the bilateral breasts in September 2023. I have discussed the findings and recommendations with the patient. If applicable, a reminder letter will be sent to the patient regarding the next appointment.  BI-RADS CATEGORY  4: Suspicious. Electronically Signed: By: Audie Pinto M.D. On: 08/02/2021 12:46  [place] AXILLA LEFT  Addendum Date: 08/19/2021   ADDENDUM REPORT: 08/19/2021 13:33 ADDENDUM: The patient returned for attempted ultrasound-guided biopsy of the left axilla on 08/19/2021. After multiple attempts of positioning the patient at the time of biopsy, the deep abnormal lymph nodes within the left axilla could not be safely accessed due to deep positioning adjacent to multiple large axillary vessels, and the patient's body habitus and left shoulder arthritis.  Therefore, today's procedure was canceled. Options for follow-up include short-term ultrasound follow-up in 2-3 months versus attempted seed localization for excision, though again the latter may prove difficult. Electronically Signed   By: Kristopher Oppenheim M.D.   On: 08/19/2021 13:33   Result Date: 08/19/2021 CLINICAL DATA:  61 year old female presenting for evaluation of possible enlarged left axillary lymph nodes identified on recent chest CT. EXAM: DIGITAL DIAGNOSTIC UNILATERAL LEFT MAMMOGRAM WITH TOMOSYNTHESIS AND CAD; [place] AXILLARY LEFT TECHNIQUE: Left digital diagnostic mammography and breast tomosynthesis was performed. The images were evaluated with computer-aided detection.; Targeted ultrasound examination of the left axilla was performed. COMPARISON:  Previous exam(s). ACR Breast Density Category c: The breast tissue is heterogeneously dense, which may obscure small masses. FINDINGS: Mammogram: Left breast: Full field tomosynthesis views of the left breast were performed. There are stable postsurgical changes in the posterior superior left breast. No suspicious mass, distortion, or microcalcifications are identified to suggest presence of malignancy. Ultrasound: Targeted ultrasound is performed in the left axilla demonstrating two deep lymph nodes with abnormal cortical thickness measuring up to 0.7 cm. IMPRESSION: Left axillary adenopathy. There are two indeterminate lymph nodes with increased cortical thickening. RECOMMENDATION: 1. Ultrasound-guided core needle biopsy of a left axillary lymph node may be performed if clinically indicated. Patient to discuss with her oncologist. Patient had abnormal left axillary lymph nodes in May 2021 which were not biopsied and subsequent sentinel node evaluation demonstrating no metastasis. 2. Return for routine diagnostic evaluation of the bilateral breasts in September 2023. I have discussed the findings and recommendations with the patient. If applicable, a reminder  letter will be sent to the patient regarding the next appointment. BI-RADS CATEGORY  4: Suspicious. Electronically Signed: By: Audie Pinto M.D. On: 08/02/2021 12:46    Recent Labs: Lab Results  Component Value Date   WBC 5.8 08/19/2021   HGB 14.9 08/19/2021   PLT 209 08/19/2021   NA 141 08/19/2021   K 4.9 08/19/2021   CL 102 08/19/2021   CO2 30 08/19/2021   GLUCOSE 92 08/19/2021   BUN 22 08/19/2021   CREATININE 0.85 08/19/2021   BILITOT 0.4 08/19/2021   ALKPHOS 66 12/14/2020   AST 14 08/19/2021   ALT 10 08/19/2021   PROT 6.3 08/19/2021   ALBUMIN 4.4 12/14/2020   CALCIUM 9.4 08/19/2021   GFRAA 101 07/30/2020   QFTBGOLDPLUS NEGATIVE 07/30/2020    Speciality Comments: No specialty comments available.  High-resolution CT June 28, 2021 IMPRESSION: 1. Left upper lobe nodule measuring 6.5 mm in mean diameter with central lucency which is likely due to associated bronchus. Recommend follow-up chest CT in 6 months for further evaluation. 2. Mild bilateral bronchial wall thickening, findings can be seen in the setting of bronchitis, likely related to COPD. 3. Asymmetric left subcentimeter axillary lymph nodes. Recommend left axillary ultrasound for further evaluation. 4. Aortic Atherosclerosis (ICD10-I70.0) and Emphysema (ICD10-J43.9).     Electronically Signed   By: Yetta Glassman M.D.   On: 06/28/2021  12:25  Procedures:  No procedures performed Allergies: Other   Assessment / Plan:     Visit Diagnoses: No diagnosis found.  Orders: No orders of the defined types were placed in this encounter.  No orders of the defined types were placed in this encounter.   Face-to-face time spent with patient was *** minutes. Greater than 50% of time was spent in counseling and coordination of care.  Follow-Up Instructions: No follow-ups on file.   Bo Merino, MD  Note - This record has been created using Editor, commissioning.  Chart creation errors have been sought,  but may not always  have been located. Such creation errors do not reflect on  the standard of medical care.

## 2021-08-22 NOTE — Telephone Encounter (Signed)
Received notification from [facility] regarding a prior authorization for ENBREL. Authorization has been APPROVED from 08/22/2021 to 02/21/2022. Approval letter sent to scan center.  Test claim could not be run as a transitional fill allowed by insurance, however pt should still have a $0.00 copay. It is also unclear at this time if pt will be locked in to [facility] or if she can continue to fill at [facility].  Authorization # [id number]

## 2021-08-22 NOTE — Telephone Encounter (Signed)
Per [facility], pt has acquired new insurance and a new PA will be needed for future fills.  Initiated a PA request for Otrexup, however Rasuvo appears to be the preferred product. Submitted a Prior Authorization request to [facility] for RASUVO via CoverMyMeds. Will update once we receive a response.   Key: [id number]   *NOTE--most recent chart notes state non-compliance (due to needing labs and f/u OV) as well as detailing worsening of RA symptoms. Despite being a continuation of therapy this is considered an initial PA with new insurance plan, therefore expect a higher chance than usual for denial.

## 2021-08-22 NOTE — Telephone Encounter (Signed)
Received notification from [facility] regarding a prior authorization for RASUVO. Authorization has been APPROVED from 08/22/2021 to 03/09/2022. Approval letter sent to scan center.  Per test claim, copay for 28 days supply is $0.00  Patient can continue to fill through [facility]: [phone]   Authorization # [id number]

## 2021-08-22 NOTE — Telephone Encounter (Signed)
Per [facility], pt has acquired new insurance and a new PA will be needed for future fills.  Submitted a Prior Authorization request to [facility] for ENBREL via CoverMyMeds. Will update once we receive a response.   Key: [id number]   *NOTE--most recent chart notes state non-compliance (due to needing labs and f/u OV) as well as detailing worsening of RA symptoms. Despite being a continuation of therapy this is considered an initial PA with new insurance plan, therefore expect a higher chance than usual for denial.

## 2021-08-23 ENCOUNTER — Other Ambulatory Visit: Payer: Self-pay | Admitting: Internal Medicine

## 2021-08-23 ENCOUNTER — Encounter: Payer: Self-pay | Admitting: Internal Medicine

## 2021-08-23 ENCOUNTER — Inpatient Hospital Stay: Payer: Medicare Other | Attending: Internal Medicine | Admitting: Internal Medicine

## 2021-08-23 ENCOUNTER — Inpatient Hospital Stay: Payer: Medicare Other | Admitting: Internal Medicine

## 2021-08-23 DIAGNOSIS — Z170 Estrogen receptor positive status [ER+]: Secondary | ICD-10-CM | POA: Insufficient documentation

## 2021-08-23 DIAGNOSIS — Z79899 Other long term (current) drug therapy: Secondary | ICD-10-CM | POA: Diagnosis not present

## 2021-08-23 DIAGNOSIS — M069 Rheumatoid arthritis, unspecified: Secondary | ICD-10-CM | POA: Diagnosis not present

## 2021-08-23 DIAGNOSIS — F32A Depression, unspecified: Secondary | ICD-10-CM | POA: Insufficient documentation

## 2021-08-23 DIAGNOSIS — C50412 Malignant neoplasm of upper-outer quadrant of left female breast: Secondary | ICD-10-CM | POA: Diagnosis not present

## 2021-08-23 DIAGNOSIS — F419 Anxiety disorder, unspecified: Secondary | ICD-10-CM | POA: Diagnosis not present

## 2021-08-23 LAB — COMPLETE METABOLIC PANEL WITH GFR
AG Ratio: 1.7 (calc) (ref 1.0–2.5)
ALT: 10 U/L (ref 6–29)
AST: 14 U/L (ref 10–35)
Albumin: 4 g/dL (ref 3.6–5.1)
Alkaline phosphatase (APISO): 58 U/L (ref 37–153)
BUN: 22 mg/dL (ref 7–25)
CO2: 30 mmol/L (ref 20–32)
Calcium: 9.4 mg/dL (ref 8.6–10.4)
Chloride: 102 mmol/L (ref 98–110)
Creat: 0.85 mg/dL (ref 0.50–1.05)
Globulin: 2.3 g/dL (calc) (ref 1.9–3.7)
Glucose, Bld: 92 mg/dL (ref 65–99)
Potassium: 4.9 mmol/L (ref 3.5–5.3)
Sodium: 141 mmol/L (ref 135–146)
Total Bilirubin: 0.4 mg/dL (ref 0.2–1.2)
Total Protein: 6.3 g/dL (ref 6.1–8.1)
eGFR: 77 mL/min/{1.73_m2} (ref >=60)

## 2021-08-23 LAB — CBC WITH DIFFERENTIAL/PLATELET
Absolute Monocytes: 429 cells/uL (ref 200–950)
Basophils Absolute: 58 cells/uL (ref 0–200)
Basophils Relative: 1 %
Eosinophils Absolute: 238 cells/uL (ref 15–500)
Eosinophils Relative: 4.1 %
HCT: 44.4 % (ref 35.0–45.0)
Hemoglobin: 14.9 g/dL (ref 11.7–15.5)
Lymphs Abs: 1641 cells/uL (ref 850–3900)
MCH: 30.7 pg (ref 27.0–33.0)
MCHC: 33.6 g/dL (ref 32.0–36.0)
MCV: 91.4 fL (ref 80.0–100.0)
MPV: 10.4 fL (ref 7.5–12.5)
Monocytes Relative: 7.4 %
Neutro Abs: 3434 cells/uL (ref 1500–7800)
Neutrophils Relative %: 59.2 %
Platelets: 209 10*3/uL (ref 140–400)
RBC: 4.86 10*6/uL (ref 3.80–5.10)
RDW: 13.8 % (ref 11.0–15.0)
Total Lymphocyte: 28.3 %
WBC: 5.8 10*3/uL (ref 3.8–10.8)

## 2021-08-23 LAB — QUANTIFERON-TB GOLD PLUS
Mitogen-NIL: 8.76 IU/mL
NIL: 0.1 IU/mL
QuantiFERON-TB Gold Plus: NEGATIVE
TB1-NIL: <0 IU/mL
TB2-NIL: <0 IU/mL

## 2021-08-23 MED ORDER — ALBUTEROL SULFATE HFA 108 (90 BASE) MCG/ACT IN AERS: INHALATION_SPRAY | RESPIRATORY_TRACT | 2 refills | Status: DC

## 2021-08-23 NOTE — Progress Notes (Unsigned)
[facility] [place] NOTE  Patient Care Team: Ladell Pier, MD as PCP - General (Internal Medicine) [name], MD as Consulting Physician (General Surgery) Bo Merino, MD as Consulting Physician (Rheumatology) Beverely Pace, LCSW (Inactive) as Social Worker ([place]) Su Monks as Physician Assistant (Physician Assistant) Cammie Sickle, MD as Consulting Physician (Oncology)  CHIEF COMPLAINTS/PURPOSE OF CONSULTATION: Breast cancer  #  Oncology History Overview Note  2021- FINAL MICROSCOPIC DIAGNOSIS:   A. BREAST, LEFT, LUMPECTOMY:  - Invasive ductal carcinoma, 1.5 cm.  - Ductal carcinoma in situ with calcifications.  - Invasive carcinoma 0.4 cm from posterior margin.  - DCIS focally involves inferior margin.  - Fibrocystic changes with sclerosing adenosis and calcifications.  - See oncology table.   B. LYMPH NODE, LEFT AXILLARY, SENTINEL, BIOPSY:  - One lymph node with no metastatic carcinoma (0/1).   C. LYMPH NODE, LEFT AXILLARY, SENTINEL, BIOPSY:  - One lymph node with no metastatic carcinoma (0/1).   D. LYMPH NODE, LEFT AXILLARY, SENTINEL, BIOPSY:  - One lymph node with no metastatic carcinoma (0/1).   E. LYMPH NODE, LEFT AXILLARY, SENTINEL, BIOPSY:  - One lymph node with no metastatic carcinoma (0/1).   F. BREAST, LEFT ADDITIONAL MEDIAL MARGIN, EXCISION:  - Fibrocystic changes with sclerosing adenosis and calcifications.  - No malignancy identified.  - Final medial margin clear.   G. BREAST, LEFT ADDITIONAL SUPERIOR MARGIN, EXCISION:  - Fibrocystic changes with sclerosing adenosis and calcifications.  - No malignancy identified.  - Final superior margin clear.   ONCOLOGY TABLE:  INVASIVE CARCINOMA OF THE BREAST:  Resection  Procedure: Left breast lumpectomy, 4 left axillary sentinel lymph nodes  with additional medial margin and additional superior margin biopsies.  Specimen Laterality: Left breast.   Tumor Size: 1.5 cm.  Histologic Type: Ductal.  Histologic Grade:       Glandular (Acinar)/Tubular Differentiation: 1.       Nuclear Pleomorphism: 1.       Mitotic Rate: 1.       Overall Grade: 1  Ductal Carcinoma In Situ: Present.  Margins: Free of invasive carcinoma.       Distance from closest margin (millimeters): 4 mm.       Specify closest margin (required only if <23m): Posterior.  DCIS Margins: Focally involves inferior margin.       Distance from closest margin (millimeters): 0 mm.       Specify closest margin (required only if <122m: Inferior.  Regional Lymph Nodes:       Number of Lymph Nodes Examined: 4       Number of Sentinel Nodes Examined (if applicable): 4       Number of Lymph Nodes with Macrometastases (>2 mm): 0       Number of Lymph Nodes with Micrometastases: 0       Number of Lymph Nodes with Isolated Tumor Cells (=0.2 mm or =200  cells): 0  Treatment Effect:  No known presurgical therapy  Breast Biomarker Testing Performed on Previous Biopsy:        Testing Performed on Case Number: [id number].             Estrogen Receptor: 95%, positive, strong staining.             Progesterone Receptor: 2%, positive, strong staining.             HER2: Negative with IHC.  Ki-67: 1%.    [name]  06/13/2019 Cancer Staging   Staging form: Breast, AJCC 8th Edition - Clinical stage from 06/13/2019: Stage 0 (cTis (DCIS), cN0, cM0, ER+, PR+)   06/15/2019 Initial Diagnosis   [name]   Malignant neoplasm of upper-outer quadrant of left breast in female, estrogen receptor positive ([place])  09/15/2019 Initial Diagnosis   Malignant neoplasm of upper-outer quadrant of left breast in female, estrogen receptor positive ([place])      HISTORY OF PRESENTING ILLNESS: Alone.  Ambulating independently. [name] 61 y.o.  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  Incidental imaging CT chest noted to have left-sided axillary.  This was followed by further evaluation with mammogram/left axillary ultrasound plan for biopsy.  However biopsy unsuccessful-because of patient positioning for because of rheumatoid arthritis/and also proximity to blood vessels.    Review of Systems  Constitutional:  Positive for malaise/fatigue. Negative for chills, diaphoresis, fever and weight loss.  HENT:  Negative for nosebleeds and sore throat.   Eyes:  Negative for double vision.  Respiratory:  Negative for cough, hemoptysis, sputum production, shortness of breath and wheezing.   Cardiovascular:  Negative for chest pain, palpitations, orthopnea and leg swelling.  Gastrointestinal:  Negative for abdominal pain, blood in stool, constipation, diarrhea, heartburn, melena, nausea and vomiting.  Genitourinary:  Negative for dysuria, frequency and urgency.  Musculoskeletal:  Positive for back pain, joint pain and neck pain.  Skin: Negative.  Negative for itching and rash.  Neurological:  Negative for dizziness, tingling, focal weakness, weakness and headaches.  Endo/Heme/Allergies:  Does not bruise/bleed easily.  Psychiatric/Behavioral:  Negative for depression. The patient is nervous/anxious. The patient does not have insomnia.      MEDICAL HISTORY:  Past Medical History:  Diagnosis Date   Anemia    during pregnancy   Anxiety    Asthma    Bronchitis age 19   Cancer ([place])    left breast ca   COPD (chronic obstructive pulmonary disease) (HCC)    Depression    Hx of bladder infections    been over 5 years since last one    Malignant neoplasm of upper-outer quadrant of left breast in female, estrogen receptor positive ([place])    Rheumatoid arthritis ([place])    unc rheum    Tobacco abuse    Trouble in sleeping     SURGICAL  HISTORY: Past Surgical History:  Procedure Laterality Date   BREAST LUMPECTOMY WITH RADIOACTIVE SEED AND SENTINEL LYMPH NODE BIOPSY Left 08/30/2019   Procedure: LEFT BREAST LUMPECTOMY X 2 WITH RADIOACTIVE SEED AND SENTINEL LYMPH NODE BIOPSY;  Surgeon: [name], MD;  Location: [facility];  Service: General;  Laterality: Left;  PEC BLOCK   COLONOSCOPY WITH PROPOFOL N/A 03/15/2019   Procedure: COLONOSCOPY WITH PROPOFOL;  Surgeon: Jonathon Bellows, MD;  Location: [facility] ENDOSCOPY;  Service: Gastroenterology;  Laterality: N/A;   HIP ARTHROPLASTY     JOINT REPLACEMENT     MULTIPLE TOOTH EXTRACTIONS     RE-EXCISION OF BREAST LUMPECTOMY Left 10/07/2019   Procedure: RE-EXCISION OF LEFT BREAST INFERIOR MARGIN;  Surgeon: [name], MD;  Location: [facility];  Service: General;  Laterality: Left;   TOTAL HIP ARTHROPLASTY Left 02/15/2018   Procedure: TOTAL HIP ARTHROPLASTY ANTERIOR  APPROACH;  Surgeon: [name], MD;  Location: [facility];  Service: Orthopedics;  Laterality: Left;   WISDOM TOOTH EXTRACTION      SOCIAL HISTORY: Social History   Socioeconomic History   Marital status: Single    Spouse name: Not on file   Number of children: 1   Years of education: 14 years   Highest education level: Some college, no degree  Occupational History   Not on file  Tobacco Use   Smoking status: Some Days    Packs/day: 0.25    Types: Cigarettes    Last attempt to quit: 09/08/2019    Years since quitting: 1.9    Passive exposure: Past   Smokeless tobacco: Never   Tobacco comments:    on nicotine patches  Vaping Use   Vaping Use: Never used  Substance and Sexual Activity   Alcohol use: Yes    Comment: 2 glasses of wine weekly   Drug use: Not Currently    Types: Cocaine   Sexual activity: Not Currently    Birth control/protection: Post-menopausal  Other Topics Concern   Not on file  Social History Narrative   Not on file   Social Determinants of Health   Financial Resource Strain:  Medium Risk (05/27/2021)   Overall Financial Resource Strain (CARDIA)    Difficulty of Paying Living Expenses: Somewhat hard  Food Insecurity: No Food Insecurity (08/09/2021)   Hunger Vital Sign    Worried About Running Out of Food in the Last Year: Never true    Ran Out of Food in the Last Year: Never true  Transportation Needs: No Transportation Needs (08/09/2021)   PRAPARE - Hydrologist (Medical): No    Lack of Transportation (Non-Medical): No  Recent Concern: Transportation Needs - Unmet Transportation Needs (05/27/2021)   PRAPARE - Transportation    Lack of Transportation (Medical): Yes    Lack of Transportation (Non-Medical): Yes  Physical Activity: Insufficiently Active (08/09/2021)   Exercise Vital Sign    Days of Exercise per Week: 4 days    Minutes of Exercise per Session: 30 min  Stress: Stress Concern Present (05/27/2021)   [place]    Feeling of Stress : To some extent  Social Connections: Socially Isolated (05/27/2021)   Social Connection and Isolation Panel [NHANES]    Frequency of Communication with Friends and Family: Once a week    Frequency of Social Gatherings with Friends and Family: Twice a week    Attends Religious Services: Never    Marine scientist or Organizations: No    Attends Archivist Meetings: Never    Marital Status: Divorced  Human resources officer Violence: Not At Risk (08/09/2021)   Humiliation, Afraid, Rape, and Kick questionnaire    Fear of Current or Ex-Partner: No    Emotionally Abused: No    Physically Abused: No    Sexually Abused: No    FAMILY HISTORY: Family History  Problem Relation Age of Onset   Breast cancer Mother 62       metastatic   Cervical cancer Mother    Lung cancer Brother        maternal half brother; d. 54s   Lung cancer Maternal Uncle        d. 104s   Lung cancer Maternal Uncle        d. 64   Cancer Maternal  Grandmother        unknown type;  dx 26s   Healthy Son    Breast cancer Maternal Great-grandmother        dx unknown age    ALLERGIES:  is allergic to other.  MEDICATIONS:  Current Outpatient Medications  Medication Sig Dispense Refill   Calcium Carb-Cholecalciferol (CALCIUM 1000 + D PO) Take by mouth.     cyanocobalamin 100 MCG tablet Take 100 mcg by mouth daily.     diazepam (VALIUM) 2 MG tablet One pill 45-60 mins prior to procedure; 1 pill 15 mins prior if needed. 3 tablet 0   Etanercept (ENBREL MINI) 50 MG/ML SOCT INJECT 50 MG INTO THE SKIN ONCE A WEEK. 12 mL 0   Glycopyrrolate-Formoterol (BEVESPI AEROSPHERE) 9-4.8 MCG/ACT AERO Inhale 2 puffs into the lungs 2 (two) times daily. 10.7 g 5   Methotrexate, PF, (OTREXUP) 20 MG/0.4ML SOAJ INJECT 20 MG INTO THE SKIN ONCE A WEEK. 1.6 mL 0   Multiple Vitamin (MULTIVITAMIN WITH MINERALS) TABS tablet Take 1 tablet by mouth daily.     nicotine (NICODERM CQ - DOSED IN MG/24 HOURS) 14 mg/24hr patch PLACE 1 PATCH ONTO THE SKIN DAILY. 28 patch 1   predniSONE (DELTASONE) 5 MG tablet Take 1 tablet by mouth daily with breakfast. (Patient taking differently: Take 5 mg by mouth daily with breakfast. As needed) 30 tablet 0   tamoxifen (NOLVADEX) 20 MG tablet TAKE 1 TABLET BY MOUTH ONCE A DAY 90 tablet 3   albuterol (VENTOLIN HFA) 108 (90 Base) MCG/ACT inhaler Take 2 puffs by mouth every 6 hours as needed for wheezing or shortness of breath 18 each 2   No current facility-administered medications for this visit.    PHYSICAL EXAMINATION:  Vitals:   08/23/21 0953  BP: 110/68  Pulse: 86  Temp: 98.4 F (36.9 C)  SpO2: 94%   Filed Weights   08/23/21 0953  Weight: 104 lb 9.6 oz (47.4 kg)    Physical Exam Vitals and nursing note reviewed.  HENT:     Head: Normocephalic and atraumatic.     Mouth/Throat:     Pharynx: Oropharynx is clear.  Eyes:     Extraocular Movements: Extraocular movements intact.     Pupils: Pupils are equal, round, and  reactive to light.  Cardiovascular:     Rate and Rhythm: Normal rate and regular rhythm.  Pulmonary:     Comments: Decreased breath sounds bilaterally.  Abdominal:     Palpations: Abdomen is soft.  Musculoskeletal:        General: Normal range of motion.     Cervical back: Normal range of motion.  Skin:    General: Skin is warm.  Neurological:     General: No focal deficit present.     Mental Status: She is alert and oriented to person, place, and time.  Psychiatric:        Behavior: Behavior normal.        Judgment: Judgment normal.      LABORATORY DATA:  I have reviewed the data as listed Lab Results  Component Value Date   WBC 5.8 08/19/2021   HGB 14.9 08/19/2021   HCT 44.4 08/19/2021   MCV 91.4 08/19/2021   PLT 209 08/19/2021   Recent Labs    10/24/20 1210 12/14/20 2154 05/22/21 1526 08/19/21 1354  NA 143 138 144 141  K 4.5 3.9 4.8 4.9  CL 105 100 104 102  CO2 $Re'28 29 31 30  'FMj$ GLUCOSE 76 136* 99 92  BUN $Re'16 16 19 22  'vHB$ CREATININE 0.84  0.77 0.85 0.85  CALCIUM 9.5 9.2 9.5 9.4  GFRNONAA >60 >60  --   --   PROT 7.0 7.2 6.9 6.3  ALBUMIN 4.1 4.4  --   --   AST $Re'18 23 15 14  'lRy$ ALT $R'19 18 13 10  'lJ$ ALKPHOS 78 66  --   --   BILITOT 0.8 1.3* 0.4 0.4    RADIOGRAPHIC STUDIES: I have personally reviewed the radiological images as listed and agreed with the findings in the report. MM DIAG BREAST TOMO UNI LEFT  Addendum Date: 08/19/2021   ADDENDUM REPORT: 08/19/2021 13:33 ADDENDUM: The patient returned for attempted ultrasound-guided biopsy of the left axilla on 08/19/2021. After multiple attempts of positioning the patient at the time of biopsy, the deep abnormal lymph nodes within the left axilla could not be safely accessed due to deep positioning adjacent to multiple large axillary vessels, and the patient's body habitus and left shoulder arthritis. Therefore, today's procedure was canceled. Options for follow-up include short-term ultrasound follow-up in 2-3 months versus  attempted seed localization for excision, though again the latter may prove difficult. Electronically Signed   By: Kristopher Oppenheim M.D.   On: 08/19/2021 13:33   Result Date: 08/19/2021 CLINICAL DATA:  61 year old female presenting for evaluation of possible enlarged left axillary lymph nodes identified on recent chest CT. EXAM: DIGITAL DIAGNOSTIC UNILATERAL LEFT MAMMOGRAM WITH TOMOSYNTHESIS AND CAD; [place] AXILLARY LEFT TECHNIQUE: Left digital diagnostic mammography and breast tomosynthesis was performed. The images were evaluated with computer-aided detection.; Targeted ultrasound examination of the left axilla was performed. COMPARISON:  Previous exam(s). ACR Breast Density Category c: The breast tissue is heterogeneously dense, which may obscure small masses. FINDINGS: Mammogram: Left breast: Full field tomosynthesis views of the left breast were performed. There are stable postsurgical changes in the posterior superior left breast. No suspicious mass, distortion, or microcalcifications are identified to suggest presence of malignancy. Ultrasound: Targeted ultrasound is performed in the left axilla demonstrating two deep lymph nodes with abnormal cortical thickness measuring up to 0.7 cm. IMPRESSION: Left axillary adenopathy. There are two indeterminate lymph nodes with increased cortical thickening. RECOMMENDATION: 1. Ultrasound-guided core needle biopsy of a left axillary lymph node may be performed if clinically indicated. Patient to discuss with her oncologist. Patient had abnormal left axillary lymph nodes in May 2021 which were not biopsied and subsequent sentinel node evaluation demonstrating no metastasis. 2. Return for routine diagnostic evaluation of the bilateral breasts in September 2023. I have discussed the findings and recommendations with the patient. If applicable, a reminder letter will be sent to the patient regarding the next appointment. BI-RADS CATEGORY  4: Suspicious. Electronically Signed:  By: Audie Pinto M.D. On: 08/02/2021 12:46  [place] AXILLA LEFT  Addendum Date: 08/19/2021   ADDENDUM REPORT: 08/19/2021 13:33 ADDENDUM: The patient returned for attempted ultrasound-guided biopsy of the left axilla on 08/19/2021. After multiple attempts of positioning the patient at the time of biopsy, the deep abnormal lymph nodes within the left axilla could not be safely accessed due to deep positioning adjacent to multiple large axillary vessels, and the patient's body habitus and left shoulder arthritis. Therefore, today's procedure was canceled. Options for follow-up include short-term ultrasound follow-up in 2-3 months versus attempted seed localization for excision, though again the latter may prove difficult. Electronically Signed   By: Kristopher Oppenheim M.D.   On: 08/19/2021 13:33   Result Date: 08/19/2021 CLINICAL DATA:  61 year old female presenting for evaluation of possible enlarged left axillary lymph nodes identified on recent  chest CT. EXAM: DIGITAL DIAGNOSTIC UNILATERAL LEFT MAMMOGRAM WITH TOMOSYNTHESIS AND CAD; [place] AXILLARY LEFT TECHNIQUE: Left digital diagnostic mammography and breast tomosynthesis was performed. The images were evaluated with computer-aided detection.; Targeted ultrasound examination of the left axilla was performed. COMPARISON:  Previous exam(s). ACR Breast Density Category c: The breast tissue is heterogeneously dense, which may obscure small masses. FINDINGS: Mammogram: Left breast: Full field tomosynthesis views of the left breast were performed. There are stable postsurgical changes in the posterior superior left breast. No suspicious mass, distortion, or microcalcifications are identified to suggest presence of malignancy. Ultrasound: Targeted ultrasound is performed in the left axilla demonstrating two deep lymph nodes with abnormal cortical thickness measuring up to 0.7 cm. IMPRESSION: Left axillary adenopathy. There are two indeterminate lymph nodes with increased  cortical thickening. RECOMMENDATION: 1. Ultrasound-guided core needle biopsy of a left axillary lymph node may be performed if clinically indicated. Patient to discuss with her oncologist. Patient had abnormal left axillary lymph nodes in May 2021 which were not biopsied and subsequent sentinel node evaluation demonstrating no metastasis. 2. Return for routine diagnostic evaluation of the bilateral breasts in September 2023. I have discussed the findings and recommendations with the patient. If applicable, a reminder letter will be sent to the patient regarding the next appointment. BI-RADS CATEGORY  4: Suspicious. Electronically Signed: By: Audie Pinto M.D. On: 08/02/2021 12:46    ASSESSMENT & PLAN:   Malignant neoplasm of upper-outer quadrant of left breast in female, estrogen receptor positive ([place]) #ER/PR positive HER2 negative stage I breast cancer-left breast.  Status postlumpectomy followed by radiation.  No chemotherapy.  On adjuvant-tamoxifen 20 mg qhs [NOV, 01/2021].  Patient tolerated tamoxifen well.  Continue same.  #Incidental left axillary lymphadenopathy [on lung cancer screening]; UNABLE-to perform biopsy left axilla lymph node [June 12th, 2023]-because of blood vessels.  We will plan to repeat ultrasound in 2 months.  Ordered today.  #Lung cancer screening: Left upper lobe lung "nodule"-6 mm in size vs mucous plugging.  Agree with recommendation to repeat scan in 6 months.   #Rheumatoid arthritis: [Dr.Deweshwer; [place]]-methotrexate/; Biologics stable.  #Anxiety/depression: Patient declines any worsening symptoms.  Does not want any psychiatric referral  # screening PAP: s/p [place] [facility] re: PAP smear   # DISPOSITION:  # follow up in 2 months; no labs# PRIOR Left axillary [place] at breast center/[place]- -- Dr.B    All questions were answered. The patient/family knows to call the clinic with any problems, questions or concerns.       Cammie Sickle, MD 08/26/2021  7:31 PM

## 2021-08-23 NOTE — Assessment & Plan Note (Addendum)
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  Status postlumpectomy followed by radiation.  No chemotherapy.  On adjuvant-tamoxifen 20 mg qhs [NOV, 01/2021].  Patient tolerated tamoxifen well.  Continue same.  #Incidental left axillary lymphadenopathy [on lung cancer screening]; UNBLE- status post node biopsy lymph on June 12th-  #Lung cancer screening: Left upper lobe lung "nodule"-6 mm in size vs mucous plugging.  Agree with recommendation to repeat scan in 6 months.   #Rheumatoid arthritis: [Dr.Deweshwer; [place]]-methotrexate/; Biologics stable.  #Anxiety/depression: Patient declines any worsening symptoms.  Does not want any psychiatric referral  # screening PAP: s/p [place] [facility] re: PAP smear   # DISPOSITION:  # follow up in 2 months; no labs# PRIOR Left axillary [place] at breast center/[place]- -- Dr.B

## 2021-08-23 NOTE — Telephone Encounter (Signed)
Requested Prescriptions  Pending Prescriptions Disp Refills  . albuterol (VENTOLIN HFA) 108 (90 Base) MCG/ACT inhaler 18 each 2    Sig: Take 2 puffs by mouth every 6 hours as needed for wheezing or shortness of breath     Pulmonology:  Beta Agonists 2 Passed - 08/23/2021  4:05 PM      Passed - Last BP in normal range    BP Readings from Last 1 Encounters:  08/23/21 110/68         Passed - Last Heart Rate in normal range    Pulse Readings from Last 1 Encounters:  08/23/21 86         Passed - Valid encounter within last 12 months    Recent Outpatient Visits          2 months ago Preoperative evaluation to rule out surgical contraindication   [place] Ladell Pier, MD   8 months ago Cocaine substance abuse [facility])   [place], MD   2 years ago Moderate persistent reactive airway disease without complication   [place], MD   2 years ago Other bursal cyst, right elbow   [place], MD   2 years ago Primary osteoarthritis of right shoulder   Primary Care at [facility], [name], MD      Future Appointments            In 5 days Bo Merino, MD [place] [facility]

## 2021-08-23 NOTE — Telephone Encounter (Signed)
Medication Refill - Medication: albuterol (VENTOLIN HFA) 108 (90 Base) MCG/ACT inhaler [[id number]]     Has the patient contacted their pharmacy? Yes.     Preferred Pharmacy (with phone number or street name):  [id number] [place] [address]  [id number] Phone: [phone]: [phone] Hours: Not open 24 hours      Has the patient been seen for an appointment in the last year OR does the patient have an upcoming appointment? Yes.    Agent: Please be advised that RX refills may take up to 3 business days. We ask that you follow-up with your pharmacy.

## 2021-08-26 NOTE — Progress Notes (Signed)
TB gold negative

## 2021-08-28 ENCOUNTER — Other Ambulatory Visit (HOSPITAL_COMMUNITY): Payer: Self-pay

## 2021-08-28 ENCOUNTER — Ambulatory Visit (INDEPENDENT_AMBULATORY_CARE_PROVIDER_SITE_OTHER): Payer: Medicare Other | Admitting: Rheumatology

## 2021-08-28 ENCOUNTER — Encounter: Payer: Self-pay | Admitting: Rheumatology

## 2021-08-28 VITALS — BP 118/78 | HR 91 | Resp 12 | Ht 65.5 in | Wt 103.6 lb

## 2021-08-28 DIAGNOSIS — M24522 Contracture, left elbow: Secondary | ICD-10-CM

## 2021-08-28 DIAGNOSIS — J438 Other emphysema: Secondary | ICD-10-CM

## 2021-08-28 DIAGNOSIS — Z79899 Other long term (current) drug therapy: Secondary | ICD-10-CM

## 2021-08-28 DIAGNOSIS — M19212 Secondary osteoarthritis, left shoulder: Secondary | ICD-10-CM | POA: Diagnosis not present

## 2021-08-28 DIAGNOSIS — Z803 Family history of malignant neoplasm of breast: Secondary | ICD-10-CM

## 2021-08-28 DIAGNOSIS — Z96642 Presence of left artificial hip joint: Secondary | ICD-10-CM

## 2021-08-28 DIAGNOSIS — F17200 Nicotine dependence, unspecified, uncomplicated: Secondary | ICD-10-CM | POA: Diagnosis not present

## 2021-08-28 DIAGNOSIS — F419 Anxiety disorder, unspecified: Secondary | ICD-10-CM

## 2021-08-28 DIAGNOSIS — F32A Depression, unspecified: Secondary | ICD-10-CM

## 2021-08-28 DIAGNOSIS — R911 Solitary pulmonary nodule: Secondary | ICD-10-CM | POA: Diagnosis not present

## 2021-08-28 DIAGNOSIS — M19211 Secondary osteoarthritis, right shoulder: Secondary | ICD-10-CM

## 2021-08-28 DIAGNOSIS — M0579 Rheumatoid arthritis with rheumatoid factor of multiple sites without organ or systems involvement: Secondary | ICD-10-CM

## 2021-08-28 DIAGNOSIS — D0512 Intraductal carcinoma in situ of left breast: Secondary | ICD-10-CM | POA: Diagnosis not present

## 2021-08-28 DIAGNOSIS — R0989 Other specified symptoms and signs involving the circulatory and respiratory systems: Secondary | ICD-10-CM

## 2021-08-28 MED ORDER — RASUVO 20 MG/0.4ML ~~LOC~~ SOAJ
20.0000 mg | SUBCUTANEOUS | 0 refills | Status: DC
  Filled 2021-08-28: qty 4.8, 84d supply, fill #0

## 2021-08-28 MED ORDER — ENBREL MINI 50 MG/ML ~~LOC~~ SOCT
50.0000 mg | SUBCUTANEOUS | 0 refills | Status: DC
Start: 2021-08-28 — End: 2022-01-02
  Filled 2021-08-28: qty 12, 84d supply, fill #0
  Filled 2021-09-23: qty 4, 28d supply, fill #0
  Filled 2021-10-22: qty 4, 28d supply, fill #1
  Filled 2021-12-05: qty 4, 28d supply, fill #2

## 2021-08-28 NOTE — Telephone Encounter (Signed)
Pended on patient's appointment for 08/28/2021

## 2021-08-28 NOTE — Patient Instructions (Addendum)
Standing Labs We placed an order today for your standing lab work.   Please have your standing labs drawn in September and every 3 months  If possible, please have your labs drawn 2 weeks prior to your appointment so that the provider can discuss your results at your appointment.  Please note that you may see your imaging and lab results in [facility] before we have reviewed them. We may be awaiting multiple results to interpret others before contacting you. Please allow our office up to 72 hours to thoroughly review all of the results before contacting the office for clarification of your results.  We have open lab daily: Monday through Thursday from 1:30-4:30 PM and Friday from 1:30-4:00 PM at the office of Dr. Bo Merino, [facility].   Please be advised, all patients with office appointments requiring lab work will take precedent over walk-in lab work.  If possible, please come for your lab work on Monday and Friday afternoons, as you may experience shorter wait times. The office is located at [address], [place], [place], [place] [address] No appointment is necessary.   Labs are drawn by [facility]. Please bring your co-pay at the time of your lab draw.  You may receive a bill from [facility] for your lab work.  Please note if you are on Hydroxychloroquine and and an order has been placed for a Hydroxychloroquine level, you will need to have it drawn 4 hours or more after your last dose.  If you wish to have your labs drawn at another location, please call the office 24 hours in advance to send orders.  If you have any questions regarding directions or hours of operation,  please call [phone].   As a reminder, please drink plenty of water prior to coming for your lab work. Thanks!   Vaccines You are taking a medication(s) that can suppress your immune system.  The following immunizations are recommended: Flu annually Covid-19  Td/Tdap (tetanus, diphtheria,  pertussis) every 10 years Pneumonia (Prevnar 15 then Pneumovax 23 at least 1 year apart.  Alternatively, can take Prevnar 20 without needing additional dose) Shingrix: 2 doses from 4 weeks to 6 months apart  Please check with your PCP to make sure you are up to date.   If you have signs or symptoms of an infection or start antibiotics: First, call your PCP for workup of your infection. Hold your medication through the infection, until you complete your antibiotics, and until symptoms resolve if you take the following: Injectable medication (Actemra, Benlysta, Cimzia, Cosentyx, Enbrel, Humira, Kevzara, Orencia, Remicade, Simponi, Stelara, Taltz, Tremfya) Methotrexate Leflunomide (Arava) Mycophenolate (Cellcept) Morrie Sheldon, Olumiant, or Rinvoq  Please get an annual skin examination to screen for skin cancer while you are on Enbrel.

## 2021-08-29 ENCOUNTER — Other Ambulatory Visit (HOSPITAL_COMMUNITY): Payer: Self-pay

## 2021-08-30 ENCOUNTER — Telehealth: Payer: Self-pay | Admitting: Pharmacist

## 2021-09-04 ENCOUNTER — Ambulatory Visit (INDEPENDENT_AMBULATORY_CARE_PROVIDER_SITE_OTHER): Payer: Medicare Other | Admitting: Internal Medicine

## 2021-09-04 DIAGNOSIS — J449 Chronic obstructive pulmonary disease, unspecified: Secondary | ICD-10-CM

## 2021-09-04 LAB — PULMONARY FUNCTION TEST
DL/VA % pred: 72 %
DL/VA: 3.01 ml/min/mmHg/L
DLCO cor % pred: 50 %
DLCO cor: 10.45 ml/min/mmHg
DLCO unc % pred: 51 %
DLCO unc: 10.7 ml/min/mmHg
FEF 25-75 Post: 0.33 L/sec
FEF 25-75 Pre: 0.34 L/sec
FEF2575-%Change-Post: 0 %
FEF2575-%Pred-Post: 14 %
FEF2575-%Pred-Pre: 14 %
FEV1-%Change-Post: 11 %
FEV1-%Pred-Post: 32 %
FEV1-%Pred-Pre: 29 %
FEV1-Post: 0.84 L
FEV1-Pre: 0.76 L
FEV1FVC-%Change-Post: 5 %
FEV1FVC-%Pred-Pre: 58 %
FEV6-%Change-Post: 3 %
FEV6-%Pred-Post: 52 %
FEV6-%Pred-Pre: 50 %
FEV6-Post: 1.7 L
FEV6-Pre: 1.63 L
FEV6FVC-%Change-Post: -1 %
FEV6FVC-%Pred-Post: 100 %
FEV6FVC-%Pred-Pre: 102 %
FVC-%Change-Post: 5 %
FVC-%Pred-Post: 51 %
FVC-%Pred-Pre: 49 %
FVC-Post: 1.75 L
FVC-Pre: 1.66 L
Post FEV1/FVC ratio: 48 %
Post FEV6/FVC ratio: 97 %
Pre FEV1/FVC ratio: 46 %
Pre FEV6/FVC Ratio: 98 %
RV % pred: 206 %
RV: 4.28 L
TLC % pred: 119 %
TLC: 6.24 L

## 2021-09-04 NOTE — Progress Notes (Signed)
PFT done today. 

## 2021-09-09 ENCOUNTER — Other Ambulatory Visit: Payer: Self-pay | Admitting: Internal Medicine

## 2021-09-09 ENCOUNTER — Telehealth: Payer: Self-pay | Admitting: Internal Medicine

## 2021-09-09 DIAGNOSIS — F17200 Nicotine dependence, unspecified, uncomplicated: Secondary | ICD-10-CM

## 2021-09-09 NOTE — Progress Notes (Signed)
Called pt and there was no answer- LMOM with results ok per DPR.

## 2021-09-09 NOTE — Telephone Encounter (Signed)
Called and spoke with patient. Read over results with patient. Patient verbalized understanding. Patient also wanted to get scheduled for a f/u with Dr. Melvyn Novas.   F/U appointment has been made for next week with MW. Nothing further needed.

## 2021-09-09 NOTE — Telephone Encounter (Signed)
Medication Refill - Medication: nicotine (NICODERM CQ - DOSED IN MG/24 HOURS) 14 mg/24hr patch [[id number]]   Has the patient contacted their pharmacy? Yes.   (Agent: If no, request that the patient contact the pharmacy for the refill. If patient does not wish to contact the pharmacy document the reason why and proceed with request.) (Agent: If yes, when and what did the pharmacy advise?)  Preferred Pharmacy (with phone number or street name):  [id number] [place] [id number] Phone: [phone]: [phone] Hours: Not open 24 hours   Has the patient been seen for an appointment in the last year OR does the patient have an upcoming appointment? Yes.    Agent: Please be advised that RX refills may take up to 3 business days. We ask that you follow-up with your pharmacy.

## 2021-09-11 MED ORDER — NICOTINE 14 MG/24HR TD PT24: MEDICATED_PATCH | TRANSDERMAL | 1 refills | Status: DC

## 2021-09-11 NOTE — Telephone Encounter (Signed)
Requested medication (s) are due for refill today: yes  Requested medication (s) are on the active medication list: yes  Last refill:  01/06/21 #28 1 refills  Future visit scheduled: no  Notes to clinic:   do you want to continue refills?     Requested Prescriptions  Pending Prescriptions Disp Refills   nicotine (NICODERM CQ - DOSED IN MG/24 HOURS) 14 mg/24hr patch 28 patch 1     Psychiatry:  Drug Dependence Therapy Passed - 09/11/2021  9:45 AM      Passed - Valid encounter within last 12 months    Recent Outpatient Visits           2 months ago Preoperative evaluation to rule out surgical contraindication   [place] Ladell Pier, MD   8 months ago Cocaine substance abuse [facility])   [place], MD   2 years ago Moderate persistent reactive airway disease without complication   [place], MD   2 years ago Other bursal cyst, right elbow   [place], MD   2 years ago Primary osteoarthritis of right shoulder   [facility], [name], MD       Future Appointments             In 1 week Melvyn Novas, Christena Deem, MD [facility]   In 2 months [place] [facility] [facility]

## 2021-09-13 ENCOUNTER — Ambulatory Visit
Admission: EM | Admit: 2021-09-13 | Discharge: 2021-09-13 | Disposition: A | Discharge status: Home / Self Care | Payer: Medicare Other | Attending: Urgent Care | Admitting: Urgent Care

## 2021-09-13 ENCOUNTER — Encounter: Payer: Self-pay | Admitting: Emergency Medicine

## 2021-09-13 ENCOUNTER — Ambulatory Visit (INDEPENDENT_AMBULATORY_CARE_PROVIDER_SITE_OTHER): Payer: Medicare Other

## 2021-09-13 DIAGNOSIS — R509 Fever, unspecified: Secondary | ICD-10-CM | POA: Diagnosis not present

## 2021-09-13 DIAGNOSIS — J439 Emphysema, unspecified: Secondary | ICD-10-CM | POA: Diagnosis not present

## 2021-09-13 DIAGNOSIS — R053 Chronic cough: Secondary | ICD-10-CM

## 2021-09-13 DIAGNOSIS — J029 Acute pharyngitis, unspecified: Secondary | ICD-10-CM | POA: Insufficient documentation

## 2021-09-13 DIAGNOSIS — R059 Cough, unspecified: Secondary | ICD-10-CM | POA: Diagnosis not present

## 2021-09-13 LAB — POCT RAPID STREP A (OFFICE): Rapid Strep A Screen: NEGATIVE

## 2021-09-13 MED ORDER — AZITHROMYCIN 250 MG PO TABS
ORAL_TABLET | ORAL | 0 refills | Status: DC
Start: 2021-09-13 — End: 2021-09-19

## 2021-09-13 NOTE — ED Triage Notes (Signed)
Pt presents with ST and fever x 2 days

## 2021-09-13 NOTE — ED Provider Notes (Signed)
Roderic Palau    CSN: [id number] Arrival date & time: 09/13/21  1540      History   Chief Complaint Chief Complaint  Patient presents with   Sore Throat   Fever    HPI LUCCIANA HEAD is a 61 y.o. female.   Pleasant 61yo female presents today with concern of a sore throat x 2-3 days. Also developed a fever of Tmax 100.1. Has hx of RA on DMARDs, but denies recurrent infections. Also reports chronic cough with recent worsening of it. Has hx of COPD. Follows with pulmonology. Endorses mild chest tightness. Pt states her throat feels like she is swallowing glass. Works part time in [employer], does not wear mask, but denies known sick contact. Has not tried any OTC meds for current symptoms. Denies GI sx, rash or ear pain. No headache.   Sore Throat Associated symptoms include shortness of breath.  Fever Associated symptoms: cough and sore throat     Past Medical History:  Diagnosis Date   Anemia    during pregnancy   Anxiety    Asthma    Bronchitis age 49   Cancer ([facility])    left breast ca   COPD (chronic obstructive pulmonary disease) (HCC)    Depression    Hx of bladder infections    been over 5 years since last one    Malignant neoplasm of upper-outer quadrant of left breast in female, estrogen receptor positive ([place])    Rheumatoid arthritis ([place])    unc rheum    Tobacco abuse    Trouble in sleeping     Patient Active Problem List   Diagnosis Date Noted   COPD GOLD  3 07/31/2021   Solitary pulmonary nodule on lung CT 07/31/2021   Malignant neoplasm of upper-outer quadrant of left breast in female, estrogen receptor positive ([place]) 09/15/2019   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   Adenomatous colon polyp 03/16/2019   Degenerative joint disease of shoulder region 06/08/2018   Rheumatoid arthritis involving both hands with positive rheumatoid factor ([place]) 06/08/2018   Rheumatoid arthritis involving both feet with positive rheumatoid  factor ([place]) 06/08/2018   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   Status post total hip replacement, 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   Smoker 06/08/2018   Anxiety and depression 06/08/2018   Family history of breast cancer 03/25/2018   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   Rheumatoid arthritis involving multiple sites ([place]) 01/15/2018   Tobacco dependence 01/15/2018   Opioid use agreement exists 01/15/2018   Positive depression screening 01/15/2018   Homeless 01/15/2018    Past Surgical History:  Procedure Laterality Date   BREAST LUMPECTOMY WITH RADIOACTIVE SEED AND SENTINEL LYMPH NODE BIOPSY Left 08/30/2019   Procedure: LEFT BREAST LUMPECTOMY X 2 WITH RADIOACTIVE SEED AND SENTINEL LYMPH NODE BIOPSY;  Surgeon: [name], MD;  Location: [facility];  Service: General;  Laterality: Left;  PEC BLOCK   COLONOSCOPY WITH PROPOFOL N/A 03/15/2019   Procedure: COLONOSCOPY WITH PROPOFOL;  Surgeon: Jonathon Bellows, MD;  Location[facility];  Service: Gastroenterology;  Laterality: N/A;   HIP ARTHROPLASTY     JOINT REPLACEMENT     MULTIPLE TOOTH EXTRACTIONS     RE-EXCISION OF BREAST LUMPECTOMY Left 10/07/2019   Procedure: RE-EXCISION OF LEFT BREAST INFERIOR MARGIN;  Surgeon: [name], MD;  Location: [facility];  Service: General;  Laterality: Left;   TOTAL HIP ARTHROPLASTY Left 02/15/2018   Procedure: TOTAL HIP ARTHROPLASTY ANTERIOR APPROACH;  Surgeon: [name], MD;  Location: [facility];  Service: Orthopedics;  Laterality: Left;   WISDOM TOOTH EXTRACTION      OB History     Gravida  3   Para  1   Term  1   Preterm      AB  2   Living  1      SAB      IAB  2   Ectopic      Multiple      Live Births  1            Home Medications    Prior to Admission medications   Medication Sig Start Date End Date Taking? Authorizing Provider  azithromycin (ZITHROMAX Z-PAK) 250 MG tablet Take two tabs by mouth today, followed by one tab PO day 2-5. 09/13/21  Yes Jayleon Mcfarlane L, PA   albuterol (VENTOLIN HFA) 108 (90 Base) MCG/ACT inhaler Take 2 puffs by mouth every 6 hours as needed for wheezing or shortness of breath 08/23/21   Ladell Pier, MD  Calcium Carb-Cholecalciferol (CALCIUM 1000 + D PO) Take by mouth.    [provider]  cyanocobalamin 100 MCG tablet Take 100 mcg by mouth daily.    [provider]  diazepam (VALIUM) 2 MG tablet One pill 45-60 mins prior to procedure; 1 pill 15 mins prior if needed. 08/13/21   Cammie Sickle, MD  Etanercept (ENBREL MINI) 50 MG/ML SOCT INJECT 50 MG INTO THE SKIN ONCE A WEEK. 08/28/21 08/28/22  Bo Merino, MD  Glycopyrrolate-Formoterol (BEVESPI AEROSPHERE) 9-4.8 MCG/ACT AERO Inhale 2 puffs into the lungs 2 (two) times daily. 08/14/21   Tanda Rockers, MD  Methotrexate, PF, (RASUVO) 20 MG/0.4ML SOAJ Inject 20 mg into the skin once a week. 08/28/21   Bo Merino, MD  Multiple Vitamin (MULTIVITAMIN WITH MINERALS) TABS tablet Take 1 tablet by mouth daily.    [provider]  nicotine (NICODERM CQ - DOSED IN MG/24 HOURS) 14 mg/24hr patch PLACE 1 PATCH ONTO THE SKIN DAILY. 09/11/21   Ladell Pier, MD  predniSONE (DELTASONE) 5 MG tablet Take 1 tablet by mouth daily with breakfast. Patient taking differently: Take 5 mg by mouth daily with breakfast. As needed 07/02/21   Bo Merino, MD  tamoxifen (NOLVADEX) 20 MG tablet TAKE 1 TABLET BY MOUTH ONCE A DAY 11/27/20 11/27/21  Magrinat, Virgie Dad, MD    Family History Family History  Problem Relation Age of Onset   Breast cancer Mother 80       metastatic   Cervical cancer Mother    Lung cancer Brother        maternal half brother; d. 53s   Lung cancer Maternal Uncle        d. 51s   Lung cancer Maternal Uncle        d. 62   Cancer Maternal Grandmother        unknown type; dx 4s   Healthy Son    Breast cancer Maternal Great-grandmother        dx unknown age    Social History Social History   Tobacco Use   Smoking status: Former     Packs/day: 0.25    Types: Cigarettes    Quit date: 09/08/2019    Years since quitting: 2.0    Passive exposure: Past   Smokeless tobacco: Never   Tobacco comments:    on nicotine patches  Vaping Use   Vaping Use: Never used  Substance Use  Topics   Alcohol use: Yes    Comment: Occassionally   Drug use: Not Currently    Types: Cocaine     Allergies   Other   Review of Systems Review of Systems  Constitutional:  Positive for fever.  HENT:  Positive for sore throat.   Respiratory:  Positive for cough and shortness of breath.   As per HPI   Physical Exam Triage Vital Signs ED Triage Vitals  Enc Vitals Group     BP 09/13/21 1551 112/75     Pulse Rate 09/13/21 1551 92     Resp 09/13/21 1551 16     Temp 09/13/21 1551 99.5 F (37.5 C)     Temp Source 09/13/21 1551 Oral     SpO2 09/13/21 1551 94 %     Weight --      Height --      Head Circumference --      Peak Flow --      Pain Score 09/13/21 1550 7     Pain Loc --      Pain Edu? --      Excl. in [place]? --    No data found.  Updated Vital Signs BP 112/75 (BP Location: Left Arm)   Pulse 92   Temp 99.5 F (37.5 C) (Oral)   Resp 16   SpO2 94%   Visual Acuity Right Eye Distance:   Left Eye Distance:   Bilateral Distance:    Right Eye Near:   Left Eye Near:    Bilateral Near:     Physical Exam Vitals and nursing note reviewed.  Constitutional:      General: She is not in acute distress.    Appearance: She is ill-appearing. She is not toxic-appearing or diaphoretic.  HENT:     Head: Normocephalic and atraumatic.     Right Ear: Tympanic membrane and ear canal normal. No drainage, swelling or tenderness. No middle ear effusion. Tympanic membrane is not erythematous.     Left Ear: Tympanic membrane and ear canal normal. No drainage, swelling or tenderness.  No middle ear effusion. Tympanic membrane is not erythematous.     Nose: No congestion or rhinorrhea.     Mouth/Throat:     Mouth: Mucous membranes  are moist. No oral lesions.     Pharynx: Oropharynx is clear. Uvula midline. Posterior oropharyngeal erythema (soft palate) present. No pharyngeal swelling, oropharyngeal exudate or uvula swelling.     Tonsils: No tonsillar exudate or tonsillar abscesses.  Eyes:     Extraocular Movements:     Right eye: Normal extraocular motion.     Left eye: Normal extraocular motion.     Conjunctiva/sclera: Conjunctivae normal.     Pupils: Pupils are equal, round, and reactive to light.  Neck:     Thyroid: No thyromegaly.  Cardiovascular:     Rate and Rhythm: Normal rate and regular rhythm.     Heart sounds: Normal heart sounds.  Pulmonary:     Effort: Pulmonary effort is normal.     Breath sounds: No wheezing or rhonchi.     Comments: Significantly decreased breath sounds on RUL, with positive egophony Musculoskeletal:     Cervical back: Normal range of motion and neck supple.  Lymphadenopathy:     Cervical: No cervical adenopathy.  Skin:    General: Skin is warm and dry.     Capillary Refill: Capillary refill takes less than 2 seconds.     Findings: No erythema or rash.  Neurological:     General: No focal deficit present.     Mental Status: She is alert and oriented to person, place, and time.  Psychiatric:        Mood and Affect: Mood normal.        Behavior: Behavior normal.      UC Treatments / Results  Labs (all labs ordered are listed, but only abnormal results are displayed) Labs Reviewed  CULTURE, GROUP A STREP [facility]))  POCT RAPID STREP A (OFFICE)    EKG   Radiology DG Chest 2 View  Result Date: 09/13/2021 CLINICAL DATA:  Cough and fever. EXAM: CHEST - 2 VIEW COMPARISON:  Chest x-ray 06/20/2021.  Chest CT 06/27/2021. FINDINGS: The heart size and mediastinal contours are within normal limits. Lungs are hyperinflated compatible with emphysema, unchanged. Surgical clips overlie the mid left chest, unchanged. There is no focal lung infiltrate, pleural effusion or pneumothorax.  Skeletal structures are unremarkable. IMPRESSION: 1. No acute cardiopulmonary process. 2.  Emphysema (ICD10-J43.9). Electronically Signed   By: Ronney Asters M.D.   On: 09/13/2021 16:56    Procedures Procedures (including critical care time)  Medications Ordered in UC Medications - No data to display  Initial Impression / Assessment and Plan / UC Course  I have reviewed the triage vital signs and the nursing notes.  Pertinent labs & imaging results that were available during my care of the patient were reviewed by me and considered in my medical decision making (see chart for details).     Acute lower respiratory tract infection - CXR read as negative, however compared to April 2023, there appear to be increased vascular markings. Concern for CAP due to mycoplasma developing given symptom presentation. Will start therapy with azithromycin. Acute pharyngitis - strep negative. Throat culture obtained, if positive pt already started on zpack. Supportive measures appropriate Fever - tylenol or ibuprofen as needed.   Final Clinical Impressions(s) / UC Diagnoses   Final diagnoses:  Acute pharyngitis, unspecified etiology  Chronic cough  Fever, unspecified     Discharge Instructions      Your xray does not show a consolidaiton, however I am concerned about developing mycoplasma pneumonia which commonly presents with sore throat and fever.  Start the azithromycin today - two pills today, followed by one pill for the next four days.  There is no interaction between your azithromycin and your methotrexate, you can take at the same time.  Please schedule a follow up with your PCP next week. Any worsening symptoms, head to the ER     ED Prescriptions     Medication Sig Dispense Auth. Provider   azithromycin (ZITHROMAX Z-PAK) 250 MG tablet Take two tabs by mouth today, followed by one tab PO day 2-5. 6 tablet Patriciaann Rabanal L, PA      PDMP not reviewed this encounter.   Chaney Malling, [place] 09/13/21 1730

## 2021-09-13 NOTE — Discharge Instructions (Addendum)
Your xray does not show a consolidaiton, however I am concerned about developing mycoplasma pneumonia which commonly presents with sore throat and fever.  Start the azithromycin today - two pills today, followed by one pill for the next four days.  There is no interaction between your azithromycin and your methotrexate, you can take at the same time.  Please schedule a follow up with your PCP next week. Any worsening symptoms, head to the ER

## 2021-09-16 LAB — CULTURE, GROUP A STREP (THRC)

## 2021-09-18 ENCOUNTER — Encounter: Payer: Self-pay | Admitting: Internal Medicine

## 2021-09-18 ENCOUNTER — Ambulatory Visit (INDEPENDENT_AMBULATORY_CARE_PROVIDER_SITE_OTHER): Payer: Medicare Other | Admitting: Internal Medicine

## 2021-09-18 DIAGNOSIS — J449 Chronic obstructive pulmonary disease, unspecified: Secondary | ICD-10-CM

## 2021-09-18 DIAGNOSIS — R911 Solitary pulmonary nodule: Secondary | ICD-10-CM

## 2021-09-18 MED ORDER — BREZTRI AEROSPHERE 160-9-4.8 MCG/ACT IN AERO: 2.0000 | INHALATION_SPRAY | Freq: Two times a day (BID) | RESPIRATORY_TRACT | 0 refills | Status: DC

## 2021-09-18 NOTE — Patient Instructions (Addendum)
Plan A = Automatic = Always=    Bevespi (or Breztri) Take 2 puffs first thing in am and then another 2 puffs about 12 hours later.   Work on inhaler technique:  relax and gently blow all the way out then take a nice smooth full deep breath back in, triggering the inhaler at same time you start breathing in.  Hold for up to 5 seconds if you can.  Rinse and gargle with water when done.  If mouth or throat bother you at all,  try brushing teeth/gums/tongue with arm and hammer toothpaste/ make a slurry and gargle and spit out.      Plan B = Backup (to supplement plan A, not to replace it) Only use your albuterol inhaler as a rescue medication to be used if you can't catch your breath by resting or doing a relaxed purse lip breathing pattern.  - The less you use it, the better it will work when you need it. - Ok to use the inhaler up to 2 puffs  every 4 hours if you must but call for appointment if use goes up over your usual need - Don't leave home without it !!  (think of it like the spare tire for your car)   If having more frequent flares I recommend you change to Breztri Take 2 puffs first thing in am and then another 2 puffs about 12 hours later.   Please schedule a follow up visit in 6  months but call sooner if needed  Add : if using pred daily for RA really no difference between bevespi/ breztri

## 2021-09-18 NOTE — Progress Notes (Unsigned)
[name], female    DOB: 07-Jun-1960   MRN: [id number]   Brief patient profile:  61  yowf  with RA Quit smoking around 03/2021 referred to pulmonary clinic in [facility]  07/31/2021 by Karle Plumber MD for  SPN in setting of recurrent bronchitis in her 61 and around 2018 more daily symptoms > started symbicort 1st around 20022 and then incruse 06/2021    History of Present Illness  07/31/2021  Pulmonary/ 1st office eval/ [name] / [place] [facility]  RA on Prednisone 5 mg  2-3/ weeks  Chief Complaint  Patient presents with   Consult    Had Lung Scan, sob, cough-clear, Incruse has helped  Dyspnea:  does water aerobics, trouble with steps, does ok flat nl pace  Cough: still having coughing fits  Sleep: bed is flat / sleeps on side  SABA use: once or twice a daily seem to help  02 none  Covid 19 x 2 vax  Feels like mtx/embrel helping arthritic symptoms Rec Plan A = Automatic = Always=    Incruse one click each am then rinse and gargle  Plan B = Backup (to supplement plan A, not to replace it) Only use your albuterol inhaler as a rescue medication Please remember to go to the lab department    allergy  Screen Eos 0.2 / IgE 85    alpha one AT phenotype  MM  Level 186         09/18/2021  f/u ov/[name] re: GOLD 3 copd  maint on bevespi 2 bid  / f/u spn  Chief Complaint  Patient presents with   Follow-up    Discuss PFT-sob-occass. cough-clear,Urgent Care 4-5 days ago with pneumonia  Dyspnea:  MMRC2 = can't walk a nl pace on a flat grade s sob but does fine slow and flat and does water aerobics too  Cough: cough better p rx for "pna" with abx/ steroids but cxr nl so likely just a viral ur/aecopd  Sleeping: flat bed/ on side one pillow fine  SABA use: up to twice daily  02: none    No obvious day to day or daytime variability or assoc excess/ purulent sputum or mucus plugs or hemoptysis or cp or chest tightness, subjective wheeze or overt sinus or hb symptoms.   Sleeping  without  nocturnal  or early am exacerbation  of respiratory  c/o's or need for noct saba. Also denies any obvious fluctuation of symptoms with weather or environmental changes or other aggravating or alleviating factors except as outlined above   No unusual exposure hx or h/o childhood pna/ asthma or knowledge of premature birth.  Current Allergies, Complete Past Medical History, Past Surgical History, Family History, and Social History were reviewed in [employer] record.  ROS  The following are not active complaints unless bolded Hoarseness, sore throat, dysphagia, dental problems, itching, sneezing,  nasal congestion or discharge of excess mucus or purulent secretions, ear ache,   fever, chills, sweats, unintended wt loss or wt gain, classically pleuritic or exertional cp,  orthopnea pnd or arm/hand swelling  or leg swelling, presyncope, palpitations, abdominal pain, anorexia, nausea, vomiting, diarrhea  or change in bowel habits or change in bladder habits, change in stools or change in urine, dysuria, hematuria,  rash, arthralgias, visual complaints, headache, numbness, weakness or ataxia or problems with walking or coordination,  change in mood or  memory.        Current Meds  Medication Sig   albuterol (  VENTOLIN HFA) 108 (90 Base) MCG/ACT inhaler Take 2 puffs by mouth every 6 hours as needed for wheezing or shortness of breath   Calcium Carb-Cholecalciferol (CALCIUM 1000 + D PO) Take by mouth.   cyanocobalamin 100 MCG tablet Take 100 mcg by mouth daily.   diazepam (VALIUM) 2 MG tablet One pill 45-60 mins prior to procedure; 1 pill 15 mins prior if needed.   Etanercept (ENBREL MINI) 50 MG/ML SOCT INJECT 50 MG INTO THE SKIN ONCE A WEEK.   Glycopyrrolate-Formoterol (BEVESPI AEROSPHERE) 9-4.8 MCG/ACT AERO Inhale 2 puffs into the lungs 2 (two) times daily.   Methotrexate, PF, (RASUVO) 20 MG/0.4ML SOAJ Inject 20 mg into the skin once a week.   Multiple Vitamin (MULTIVITAMIN WITH  MINERALS) TABS tablet Take 1 tablet by mouth daily.   nicotine (NICODERM CQ - DOSED IN MG/24 HOURS) 14 mg/24hr patch PLACE 1 PATCH ONTO THE SKIN DAILY.   predniSONE (DELTASONE) 5 MG tablet Take 1 tablet by mouth daily with breakfast. (Patient taking differently: Take 5 mg by mouth daily with breakfast. As needed)   tamoxifen (NOLVADEX) 20 MG tablet TAKE 1 TABLET BY MOUTH ONCE A DAY                        Past Medical History:  Diagnosis Date   Anemia    during pregnancy   Anxiety    Asthma    Bronchitis age 61   Cancer ([place])    left breast ca   COPD (chronic obstructive pulmonary disease) (HCC)    Depression    Hx of bladder infections    been over 5 years since last one    Malignant neoplasm of upper-outer quadrant of left breast in female, estrogen receptor positive ([place])    Rheumatoid arthritis ([place])    unc rheum    Tobacco abuse    Trouble in sleeping         Objective:       Wt Readings from Last 3 Encounters:  09/18/21 108 lb 9.6 oz (49.3 kg)  08/28/21 103 lb 9.6 oz (47 kg)  08/23/21 104 lb 9.6 oz (47.4 kg)      Vital signs reviewed  09/18/2021  - Note at rest 02 sats  97% on RA   General appearance:    still smoker's rattle     HEENT :  Oropharynx  clear/ poor dentition   Nasal turbinates nl    NECK :  without JVD/Nodes/TM/ nl carotid upstrokes bilaterally   LUNGS: no acc muscle use,  Mod barrel  contour chest wall with bilateral  Distant bs s audible wheeze and  without cough on insp or exp maneuvers and mod  Hyperresonant  to  percussion bilaterally     CV:  RRR  no s3 or murmur or increase in P2, and no edema   ABD:  soft and nontender with pos mid insp Hoover's  in the supine position. No bruits or organomegaly appreciated, bowel sounds nl  MS:   Ext warm without deformities or   obvious joint restrictions , calf tenderness, cyanosis or clubbing  SKIN: warm and dry without lesions    NEURO:  alert, approp, nl sensorium with  no motor or  cerebellar deficits apparent.           I personally reviewed images and agree with radiology impression as follows:  CXR:   pa and lateral 09/13/21  1. No acute cardiopulmonary process. 2.  Emphysema (ICD10-J43.9).  Assessment

## 2021-09-19 ENCOUNTER — Encounter: Payer: Self-pay | Admitting: Internal Medicine

## 2021-09-19 NOTE — Assessment & Plan Note (Signed)
Quit smoking 03/2021  Chest CT  06/27/21  Left upper lobe nodule measuring 6.5 mm in mean diameter with central lucency which is likely due to associated bronchus. - rec CT chest 12/27/21 planned   Discussed in detail all the  indications, usual  risks and alternatives  relative to the benefits with patient who agrees to proceed with w/u as outlined.            Each maintenance medication was reviewed in detail including emphasizing most importantly the difference between maintenance and prns and under what circumstances the prns are to be triggered using an action plan format where appropriate.  Total time for H and P, chart review, counseling, reviewing hfa device(s) and generating customized AVS unique to this office visit / same day charting = 34 min

## 2021-09-19 NOTE — Assessment & Plan Note (Addendum)
Quit smoking around 03/2021  - HRCT 06/27/21  Mild centrolubular emphysema  -  Labs ordered 07/31/2021  :  allergy  Screen Eos 0.2 / IgE 85    alpha one AT phenotype  MM  Level 186   - PFT's  09/05/21  FEV1 0.84 (32 % ) ratio 0.48  p 11 % improvement from saba p bevespi  prior to study with DLCO  10.7 (51%)  and FV curve classically concave   - .09/18/2021  After extensive coaching inhaler device,  effectiveness =  85% > trial of breztri 2bid due to ? aecopd 09/11/2021    Group D (now reclassified as E) in terms of symptom/risk and laba/lama/ICS  therefore appropriate rx at this point >>>  breztri 2 bid if insurance covers and she tolerates in terms of potential side effects  - goal is less saba need, fewer aecopd  Re SABA :  I spent extra time with pt today reviewing appropriate use of albuterol for prn use on exertion with the following points: 1) saba is for relief of sob that does not improve by walking a slower pace or resting but rather if the pt does not improve after trying this first. 2) If the pt is convinced, as many are, that saba helps recover from activity faster then it's easy to tell if this is the case by re-challenging : ie stop, take the inhaler, then p 5 minutes try the exact same activity (intensity of workload) that just caused the symptoms and see if they are substantially diminished or not after saba 3) if there is an activity that reproducibly causes the symptoms, try the saba 15 min before the activity on alternate days   If in fact the saba really does help, then fine to continue to use it prn but advised may need to look closer at the maintenance regimen being used to achieve better control of airways disease with exertion.   Late add: Add : if using pred daily for RA really no difference between bevespi/ breztri / will need to track pred rx with each ov

## 2021-09-20 ENCOUNTER — Other Ambulatory Visit (HOSPITAL_COMMUNITY): Payer: Self-pay

## 2021-09-23 ENCOUNTER — Other Ambulatory Visit (HOSPITAL_COMMUNITY): Payer: Self-pay

## 2021-10-01 ENCOUNTER — Other Ambulatory Visit (HOSPITAL_COMMUNITY): Payer: Self-pay

## 2021-10-03 ENCOUNTER — Other Ambulatory Visit: Payer: Medicare Other

## 2021-10-07 ENCOUNTER — Other Ambulatory Visit (HOSPITAL_COMMUNITY): Payer: Self-pay

## 2021-10-07 ENCOUNTER — Other Ambulatory Visit: Payer: Self-pay | Admitting: Rheumatology

## 2021-10-07 ENCOUNTER — Ambulatory Visit
Admission: RE | Admit: 2021-10-07 | Discharge: 2021-10-07 | Disposition: A | Discharge status: Home / Self Care | Payer: Medicare Other | Source: Ambulatory Visit | Attending: Internal Medicine | Admitting: Internal Medicine

## 2021-10-07 DIAGNOSIS — Z170 Estrogen receptor positive status [ER+]: Secondary | ICD-10-CM | POA: Insufficient documentation

## 2021-10-07 DIAGNOSIS — C50412 Malignant neoplasm of upper-outer quadrant of left female breast: Secondary | ICD-10-CM | POA: Insufficient documentation

## 2021-10-07 DIAGNOSIS — N6489 Other specified disorders of breast: Secondary | ICD-10-CM | POA: Diagnosis not present

## 2021-10-07 NOTE — Telephone Encounter (Signed)
We will call in a prescription for prednisone when patient develops a flare.

## 2021-10-07 NOTE — Telephone Encounter (Signed)
I spoke with patient. She states she injects in the thigh. She states that she has difficult initiating injections (doesn't have hand strength). I did advise that her insurance does not prefer Otrexup. We can appeal but it will require several steps and may be several weeks to month process.  She stats she will ask her daughter-in-law to help her administer. If no success in finding someone to help administer, she will call and request switching back to tablets. She was switched to injectable MTX due to efficacy not intolerable side effects.  Patient is requesting a refill on prednisone. States she is not having a flare but takes if she has flare. I did advise that Dr. Estanislado Pandy may not send unless she is having active flare. She states she is taking Enbrel as prescribed.  Knox Saliva, PharmD, MPH, BCPS, CPP Clinical Pharmacist (Rheumatology and Pulmonology)

## 2021-10-07 NOTE — Telephone Encounter (Signed)
Patient called the office stating because of an insurance change the injector she uses for MTX is difficult to use. Patient states she cannot use the injector. Patient requests something new because she cannot get it to click.

## 2021-10-08 NOTE — Telephone Encounter (Signed)
Left message to advise patient we will call in a prescription for prednisone when patient develops a flare.

## 2021-10-09 ENCOUNTER — Telehealth: Payer: Self-pay | Admitting: Pharmacist

## 2021-10-09 ENCOUNTER — Other Ambulatory Visit (HOSPITAL_COMMUNITY): Payer: Self-pay

## 2021-10-09 DIAGNOSIS — M0579 Rheumatoid arthritis with rheumatoid factor of multiple sites without organ or systems involvement: Secondary | ICD-10-CM

## 2021-10-09 MED ORDER — PREDNISONE 5 MG PO TABS: ORAL_TABLET | ORAL | 0 refills | Status: DC

## 2021-10-09 MED ORDER — METHOTREXATE 2.5 MG PO TABS: 20.0000 mg | ORAL_TABLET | ORAL | 2 refills | Status: DC

## 2021-10-09 NOTE — Addendum Note (Signed)
Addended by: Carole Binning on: 10/09/2021 01:01 PM   Modules accepted: Orders

## 2021-10-09 NOTE — Telephone Encounter (Signed)
Patient had previously stated that if she was unable to find anyone to assist with Rasuvo injections, she would be willing to switch back to MTX tablets. We can try for [facility] approval but it will be denied and likely require extended appeal process. While we work on this, patient should be switched back to tablets to prevent any missed MTX doses  Knox Saliva, PharmD, MPH, BCPS, CPP Clinical Pharmacist (Rheumatology and Pulmonology)

## 2021-10-09 NOTE — Telephone Encounter (Signed)
Patient advised prescriptions have been sent to the pharmacy.

## 2021-10-09 NOTE — Telephone Encounter (Signed)
Patient contacted the office. Patient advised we will only prescribe Prednisone when she is flaring and we will need her to contact us so we have that documentation. Patient states now states she is flaring, having pain in her shoulders and pain and swelling in her hands. Patient is requesting a prescription for Prednisone.   [facility]- Patient states she is not able to help with the Rasuvo injections.

## 2021-10-09 NOTE — Telephone Encounter (Signed)
It is okay to send prescription for methotrexate 8 tablets p.o. weekly total 30-day supply with 2 refills.

## 2021-10-09 NOTE — Telephone Encounter (Signed)
Patient previously on Otrexup but had to switch to Rasuvo due to formulary/insurance change. She is unable to administer Rasuvo due to limited hand strength in pushing activation button. Please run authorization for Otrexup - will have to appeal. Otrexup is activated by pushing against skin and does not require thumb to activate injection  Please include details noted in telephone encounter from 10/07/21  Knox Saliva, PharmD, MPH, BCPS, CPP Clinical Pharmacist (Rheumatology and Pulmonology)

## 2021-10-09 NOTE — Telephone Encounter (Signed)
Attempted to submit a Prior Authorization request to [facility] for OTREXUP via CoverMyMeds, request was cancelled due to previous approval from 08/22/2021 to 03/09/2022 (same date range of Rasuvo approval). Test claim rejects due to "refill too soon" rather than a "non-formulary product" rejection.   Key: [id number]

## 2021-10-09 NOTE — Telephone Encounter (Signed)
Okay to send a prescription for prednisone starting at 20 mg and taper by 5 mg every 4 days.

## 2021-10-10 ENCOUNTER — Other Ambulatory Visit (HOSPITAL_COMMUNITY): Payer: Self-pay

## 2021-10-10 MED ORDER — OTREXUP 20 MG/0.4ML ~~LOC~~ SOAJ
20.0000 mg | SUBCUTANEOUS | 1 refills | Status: DC
  Filled 2021-10-10 – 2021-12-05 (×2): qty 1.6, 28d supply, fill #0
  Filled 2022-01-02: qty 1.6, 28d supply, fill #1

## 2021-10-10 NOTE — Telephone Encounter (Signed)
Rx for Otrexup '20mg'$  SQ weekly sent to [facility] to be processed on 10/30/21  Will re-check rx on 10/30/21 and see if rx processes successfully.  Patient aware of refill too soon rejection. She will continue to take MTX tablets until 10/30/21  Knox Saliva, PharmD, MPH, BCPS, CPP Clinical Pharmacist (Rheumatology and Pulmonology)

## 2021-10-11 ENCOUNTER — Telehealth: Payer: Self-pay | Admitting: Internal Medicine

## 2021-10-11 ENCOUNTER — Inpatient Hospital Stay: Payer: Medicare Other | Attending: Internal Medicine | Admitting: Internal Medicine

## 2021-10-11 NOTE — Telephone Encounter (Signed)
Patient missed appointment. Called to see if she wanted to reschedule.

## 2021-10-11 NOTE — Assessment & Plan Note (Deleted)
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  Status postlumpectomy followed by radiation.  No chemotherapy.  On adjuvant-tamoxifen 20 mg qhs [NOV, 01/2021].  Patient tolerated tamoxifen well.  Continue same.  #Incidental left axillary lymphadenopathy [on lung cancer screening]; UNABLE-to perform biopsy left axilla lymph node [June 12th, 2023]-because of blood vessels.  We will plan to repeat ultrasound in 2 months.  Ordered today. RECOMMENDATION: 1. LEFT axillary ultrasound in 6 months. 2. Annual bilateral mammogram is due in late September of 2023. Patient is aware. The LEFT axilla can be re-evaluated at that time as well. 3. Chest CT report of 06/27/2021 described a LEFT upper lobe pulmonary nodule for which a follow-up chest CT was recommended 6 months after that exam (October/November of 2023). The axillary lymph nodes can be re-evaluated on that recommended follow-up chest CT as well.  I have discussed the findings and recommendations with the patient. If applicable, a reminder letter will be sent to the patient regarding the next appointment.  BI-RADS CATEGORY  3: Probably benign.   Electronically Signed   By: Franki Cabot M.D.   On: 10/07/2021 16:33 #Lung cancer screening: Left upper lobe lung "nodule"-6 mm in size vs mucous plugging.  Agree with recommendation to repeat scan in 6 months.   #Rheumatoid arthritis: [Dr.Deweshwer; [place]]-methotrexate/; Biologics stable.  #Anxiety/depression: Patient declines any worsening symptoms.  Does not want any psychiatric referral  # screening PAP: s/p [place] [facility] re: PAP smear   # DISPOSITION:  # follow up in 2 months; no labs# PRIOR Left axillary [place] at breast center/[place]- -- Dr.B

## 2021-10-11 NOTE — Progress Notes (Deleted)
[facility] [place] NOTE  Patient Care Team: Ladell Pier, MD as PCP - General (Internal Medicine) [name], MD as Consulting Physician (General Surgery) Bo Merino, MD as Consulting Physician (Rheumatology) Beverely Pace, LCSW (Inactive) as Social Worker ([place]) Su Monks as Physician Assistant (Physician Assistant) Cammie Sickle, MD as Consulting Physician (Oncology)  CHIEF COMPLAINTS/PURPOSE OF CONSULTATION: Breast cancer  #  Oncology History Overview Note  2021- FINAL MICROSCOPIC DIAGNOSIS:   A. BREAST, LEFT, LUMPECTOMY:  - Invasive ductal carcinoma, 1.5 cm.  - Ductal carcinoma in situ with calcifications.  - Invasive carcinoma 0.4 cm from posterior margin.  - DCIS focally involves inferior margin.  - Fibrocystic changes with sclerosing adenosis and calcifications.  - See oncology table.   B. LYMPH NODE, LEFT AXILLARY, SENTINEL, BIOPSY:  - One lymph node with no metastatic carcinoma (0/1).   C. LYMPH NODE, LEFT AXILLARY, SENTINEL, BIOPSY:  - One lymph node with no metastatic carcinoma (0/1).   D. LYMPH NODE, LEFT AXILLARY, SENTINEL, BIOPSY:  - One lymph node with no metastatic carcinoma (0/1).   E. LYMPH NODE, LEFT AXILLARY, SENTINEL, BIOPSY:  - One lymph node with no metastatic carcinoma (0/1).   F. BREAST, LEFT ADDITIONAL MEDIAL MARGIN, EXCISION:  - Fibrocystic changes with sclerosing adenosis and calcifications.  - No malignancy identified.  - Final medial margin clear.   G. BREAST, LEFT ADDITIONAL SUPERIOR MARGIN, EXCISION:  - Fibrocystic changes with sclerosing adenosis and calcifications.  - No malignancy identified.  - Final superior margin clear.   ONCOLOGY TABLE:  INVASIVE CARCINOMA OF THE BREAST:  Resection  Procedure: Left breast lumpectomy, 4 left axillary sentinel lymph nodes  with additional medial margin and additional superior margin biopsies.  Specimen Laterality: Left breast.   Tumor Size: 1.5 cm.  Histologic Type: Ductal.  Histologic Grade:       Glandular (Acinar)/Tubular Differentiation: 1.       Nuclear Pleomorphism: 1.       Mitotic Rate: 1.       Overall Grade: 1  Ductal Carcinoma In Situ: Present.  Margins: Free of invasive carcinoma.       Distance from closest margin (millimeters): 4 mm.       Specify closest margin (required only if <23m): Posterior.  DCIS Margins: Focally involves inferior margin.       Distance from closest margin (millimeters): 0 mm.       Specify closest margin (required only if <122m: Inferior.  Regional Lymph Nodes:       Number of Lymph Nodes Examined: 4       Number of Sentinel Nodes Examined (if applicable): 4       Number of Lymph Nodes with Macrometastases (>2 mm): 0       Number of Lymph Nodes with Micrometastases: 0       Number of Lymph Nodes with Isolated Tumor Cells (=0.2 mm or =200  cells): 0  Treatment Effect:  No known presurgical therapy  Breast Biomarker Testing Performed on Previous Biopsy:        Testing Performed on Case Number: [id number].             Estrogen Receptor: 95%, positive, strong staining.             Progesterone Receptor: 2%, positive, strong staining.             HER2: Negative with IHC.  Ki-67: 1%.    [name]  06/13/2019 Cancer Staging   Staging form: Breast, AJCC 8th Edition - Clinical stage from 06/13/2019: Stage 0 (cTis (DCIS), cN0, cM0, ER+, PR+)   06/15/2019 Initial Diagnosis   [name]   Malignant neoplasm of upper-outer quadrant of left breast in female, estrogen receptor positive ([place])  09/15/2019 Initial Diagnosis   Malignant neoplasm of upper-outer quadrant of left breast in female, estrogen receptor positive ([place])      HISTORY OF PRESENTING ILLNESS: Alone.  Ambulating independently. [name] 61 y.o.  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  Incidental imaging CT chest noted to have left-sided axillary.  This was followed by further evaluation with mammogram/left axillary ultrasound plan for biopsy.  However biopsy unsuccessful-because of patient positioning for because of rheumatoid arthritis/and also proximity to blood vessels.    Review of Systems  Constitutional:  Positive for malaise/fatigue. Negative for chills, diaphoresis, fever and weight loss.  HENT:  Negative for nosebleeds and sore throat.   Eyes:  Negative for double vision.  Respiratory:  Negative for cough, hemoptysis, sputum production, shortness of breath and wheezing.   Cardiovascular:  Negative for chest pain, palpitations, orthopnea and leg swelling.  Gastrointestinal:  Negative for abdominal pain, blood in stool, constipation, diarrhea, heartburn, melena, nausea and vomiting.  Genitourinary:  Negative for dysuria, frequency and urgency.  Musculoskeletal:  Positive for back pain, joint pain and neck pain.  Skin: Negative.  Negative for itching and rash.  Neurological:  Negative for dizziness, tingling, focal weakness, weakness and headaches.  Endo/Heme/Allergies:  Does not bruise/bleed easily.  Psychiatric/Behavioral:  Negative for depression. The patient is nervous/anxious. The patient does not have insomnia.      MEDICAL HISTORY:  Past Medical History:  Diagnosis Date  . Anemia    during pregnancy  . Anxiety   . Asthma   . Bronchitis age 20  . Cancer ([place])    left breast ca  . COPD (chronic obstructive pulmonary disease) ([place])   . Depression   . Hx of bladder infections    been over 5 years since last one   . Malignant neoplasm of upper-outer quadrant of left breast in female, estrogen receptor positive ([place])   . Rheumatoid arthritis ([place])    unc rheum   . Tobacco abuse   . Trouble in sleeping      SURGICAL HISTORY: Past Surgical History:  Procedure Laterality Date  . BREAST LUMPECTOMY WITH RADIOACTIVE SEED AND SENTINEL LYMPH NODE BIOPSY Left 08/30/2019   Procedure: LEFT BREAST LUMPECTOMY X 2 WITH RADIOACTIVE SEED AND SENTINEL LYMPH NODE BIOPSY;  Surgeon: [name], MD;  Location: [place];  Service: General;  Laterality: Left;  PEC BLOCK  . COLONOSCOPY WITH PROPOFOL N/A 03/15/2019   Procedure: COLONOSCOPY WITH PROPOFOL;  Surgeon: Jonathon Bellows, MD;  [facility];  Service: Gastroenterology;  Laterality: N/A;  . HIP ARTHROPLASTY    . JOINT REPLACEMENT    . MULTIPLE TOOTH EXTRACTIONS    . RE-EXCISION OF BREAST LUMPECTOMY Left 10/07/2019   Procedure: RE-EXCISION OF LEFT BREAST INFERIOR MARGIN;  Surgeon: [name], MD;  Location: [place];  Service: General;  Laterality: Left;  . TOTAL HIP ARTHROPLASTY Left 02/15/2018   Procedure: TOTAL HIP ARTHROPLASTY ANTERIOR  APPROACH;  Surgeon: [name], MD;  Location: [facility];  Service: Orthopedics;  Laterality: Left;  . WISDOM TOOTH EXTRACTION      SOCIAL HISTORY: Social History   Socioeconomic History  . Marital status: Single    Spouse name: Not on file  . Number of children: 1  . Years of education: 14 years  . Highest education level: Some college, no degree  Occupational History  . Not on file  Tobacco Use  . Smoking status: Former    Packs/day: 0.25    Types: Cigarettes    Quit date: 03/10/2021    Years since quitting: 0.5    Passive exposure: Past  . Smokeless tobacco: Never  . Tobacco comments:    on nicotine patches  Vaping Use  . Vaping Use: Never used  Substance and Sexual Activity  . Alcohol use: Yes    Comment: Occassionally  . Drug use: Not Currently    Types: Cocaine  . Sexual activity: Not Currently    Birth control/protection: Post-menopausal  Other Topics Concern  . Not on file  Social History Narrative  . Not on file   Social Determinants of Health   Financial  Resource Strain: Medium Risk (05/27/2021)   Overall Financial Resource Strain (CARDIA)   . Difficulty of Paying Living Expenses: Somewhat hard  Food Insecurity: No Food Insecurity (08/09/2021)   Hunger Vital Sign   . Worried About Charity fundraiser in the Last Year: Never true   . Ran Out of Food in the Last Year: Never true  Transportation Needs: No Transportation Needs (08/09/2021)   PRAPARE - Transportation   . Lack of Transportation (Medical): No   . Lack of Transportation (Non-Medical): No  Recent Concern: Transportation Needs - Unmet Transportation Needs (05/27/2021)   PRAPARE - Transportation   . Lack of Transportation (Medical): Yes   . Lack of Transportation (Non-Medical): Yes  Physical Activity: Insufficiently Active (08/09/2021)   Exercise Vital Sign   . Days of Exercise per Week: 4 days   . Minutes of Exercise per Session: 30 min  Stress: Stress Concern Present (05/27/2021)   [place]   . Feeling of Stress : To some extent  Social Connections: Socially Isolated (05/27/2021)   Social Connection and Isolation Panel [NHANES]   . Frequency of Communication with Friends and Family: Once a week   . Frequency of Social Gatherings with Friends and Family: Twice a week   . Attends Religious Services: Never   . Active Member of Clubs or Organizations: No   . Attends Archivist Meetings: Never   . Marital Status: Divorced  Human resources officer Violence: Not At Risk (08/09/2021)   Humiliation, Afraid, Rape, and Kick questionnaire   . Fear of Current or Ex-Partner: No   . Emotionally Abused: No   . Physically Abused: No   . Sexually Abused: No    FAMILY HISTORY: Family History  Problem Relation Age of Onset  . Breast cancer Mother 69       metastatic  . Cervical cancer Mother   . Lung cancer Brother        maternal half brother; d. 49s  . Lung cancer Maternal Uncle        d. 38s  . Lung cancer Maternal  Uncle        d. 15  . Cancer Maternal Grandmother        unknown type; dx 90s  . Healthy Son   .  Breast cancer Maternal Great-grandmother        dx unknown age    ALLERGIES:  is allergic to other.  MEDICATIONS:  Current Outpatient Medications  Medication Sig Dispense Refill  . albuterol (VENTOLIN HFA) 108 (90 Base) MCG/ACT inhaler Take 2 puffs by mouth every 6 hours as needed for wheezing or shortness of breath 18 each 2  . Budeson-Glycopyrrol-Formoterol (BREZTRI AEROSPHERE) 160-9-4.8 MCG/ACT AERO Inhale 2 puffs into the lungs in the morning and at bedtime. 5.9 g 0  . Calcium Carb-Cholecalciferol (CALCIUM 1000 + D PO) Take by mouth.    . cyanocobalamin 100 MCG tablet Take 100 mcg by mouth daily.    . diazepam (VALIUM) 2 MG tablet One pill 45-60 mins prior to procedure; 1 pill 15 mins prior if needed. 3 tablet 0  . Etanercept (ENBREL MINI) 50 MG/ML SOCT INJECT 50 MG INTO THE SKIN ONCE A WEEK. 12 mL 0  . Glycopyrrolate-Formoterol (BEVESPI AEROSPHERE) 9-4.8 MCG/ACT AERO Inhale 2 puffs into the lungs 2 (two) times daily. 10.7 g 5  . methotrexate (RHEUMATREX) 2.5 MG tablet Take 8 tablets (20 mg total) by mouth once a week. Caution:Chemotherapy. Protect from light. 32 tablet 2  . Methotrexate, PF, (OTREXUP) 20 MG/0.4ML SOAJ Inject 20 mg into the skin once a week. 1.6 mL 1  . Multiple Vitamin (MULTIVITAMIN WITH MINERALS) TABS tablet Take 1 tablet by mouth daily.    . nicotine (NICODERM CQ - DOSED IN MG/24 HOURS) 14 mg/24hr patch PLACE 1 PATCH ONTO THE SKIN DAILY. 28 patch 1  . predniSONE (DELTASONE) 5 MG tablet Take 1 tablet by mouth daily with breakfast. (Patient taking differently: Take 5 mg by mouth daily with breakfast. As needed) 30 tablet 0  . predniSONE (DELTASONE) 5 MG tablet Take 4 tabs po x 4 days, 3  tabs po x 4 days, 2  tabs po x 4 days, 1  tab po x 4 days 40 tablet 0  . tamoxifen (NOLVADEX) 20 MG tablet TAKE 1 TABLET BY MOUTH ONCE A DAY 90 tablet 3   No current  facility-administered medications for this visit.    PHYSICAL EXAMINATION:  There were no vitals filed for this visit.  There were no vitals filed for this visit.   Physical Exam Vitals and nursing note reviewed.  HENT:     Head: Normocephalic and atraumatic.     Mouth/Throat:     Pharynx: Oropharynx is clear.  Eyes:     Extraocular Movements: Extraocular movements intact.     Pupils: Pupils are equal, round, and reactive to light.  Cardiovascular:     Rate and Rhythm: Normal rate and regular rhythm.  Pulmonary:     Comments: Decreased breath sounds bilaterally.  Abdominal:     Palpations: Abdomen is soft.  Musculoskeletal:        General: Normal range of motion.     Cervical back: Normal range of motion.  Skin:    General: Skin is warm.  Neurological:     General: No focal deficit present.     Mental Status: She is alert and oriented to person, place, and time.  Psychiatric:        Behavior: Behavior normal.        Judgment: Judgment normal.     LABORATORY DATA:  I have reviewed the data as listed Lab Results  Component Value Date   WBC 5.8 08/19/2021   HGB 14.9 08/19/2021   HCT 44.4 08/19/2021   MCV 91.4 08/19/2021   PLT  209 08/19/2021   Recent Labs    10/24/20 1210 12/14/20 2154 05/22/21 1526 08/19/21 1354  NA 143 138 144 141  K 4.5 3.9 4.8 4.9  CL 105 100 104 102  CO2 $Re'28 29 31 30  'VWx$ GLUCOSE 76 136* 99 92  BUN $Re'16 16 19 22  'sTC$ CREATININE 0.84 0.77 0.85 0.85  CALCIUM 9.5 9.2 9.5 9.4  GFRNONAA >60 >60  --   --   PROT 7.0 7.2 6.9 6.3  ALBUMIN 4.1 4.4  --   --   AST $Re'18 23 15 14  'ukL$ ALT $R'19 18 13 10  'dq$ ALKPHOS 78 66  --   --   BILITOT 0.8 1.3* 0.4 0.4     RADIOGRAPHIC STUDIES: I have personally reviewed the radiological images as listed and agreed with the findings in the report. US Breast Limited Uni Left Inc Axilla  Result Date: 10/07/2021 CLINICAL DATA:  Chest CT of 06/27/2021 described LEFT axillary lymph nodes for which a LEFT axillary ultrasound  was performed on 08/02/2021. The LEFT axillary ultrasound showed 2 deep lymph nodes within the LEFT axilla which appeared morphologically abnormal with cortical thickness measurements of 7 mm and 4 mm respectively. Ultrasound-guided biopsy was recommended. Patient presented for ultrasound-guided biopsy of a LEFT axillary lymph node, however, the lymph nodes were not safely accessible due to their deep position in the axilla adjacent to multiple large axillary vessels and inability to properly position the patient for the biopsies due to LEFT shoulder arthritis. Patient presents today for a follow-up axillary ultrasound to assess for change. Patient has a history of rheumatoid arthritis. Patient has history of LEFT breast cancer status post lumpectomy in 2021. EXAM: ULTRASOUND OF THE LEFT AXILLA COMPARISON:  Previous exams including LEFT axillary ultrasound dated 08/02/2021 and chest CT dated 06/27/2021. Comparison is also made to images from MRI-guided breast biopsy dated 07/07/2019. FINDINGS: Targeted ultrasound is performed, again showing the 2 morphologically abnormal lymph nodes in the LEFT axilla, with cortical thickness measurement for the largest lymph node measuring 6 mm (previously 7 mm). Two similar-appearing lymph nodes are seen on the earlier MRI-guided breast biopsy of 07/07/2019, suggesting benignity for these axillary lymph nodes seen on ultrasound. IMPRESSION: Probably benign reactive lymph nodes within the LEFT axilla, largest with a cortical thickness measurement of 6 mm. The similar-appearing lymph nodes seen in the LEFT axilla on MRI-guided biopsy of 07/07/2019 is reassuring, suggesting that the lymph nodes seen by ultrasound are reactive in nature and compatible with patient's history of rheumatoid arthritis. However, recommend additional follow-up LEFT axillary ultrasound in 6 months to ensure continued stability. RECOMMENDATION: 1. LEFT axillary ultrasound in 6 months. 2. Annual bilateral  mammogram is due in late September of 2023. Patient is aware. The LEFT axilla can be re-evaluated at that time as well. 3. Chest CT report of 06/27/2021 described a LEFT upper lobe pulmonary nodule for which a follow-up chest CT was recommended 6 months after that exam (October/November of 2023). The axillary lymph nodes can be re-evaluated on that recommended follow-up chest CT as well. I have discussed the findings and recommendations with the patient. If applicable, a reminder letter will be sent to the patient regarding the next appointment. BI-RADS CATEGORY  3: Probably benign. Electronically Signed   By: Franki Cabot M.D.   On: 10/07/2021 16:33  DG Chest 2 View  Result Date: 09/13/2021 CLINICAL DATA:  Cough and fever. EXAM: CHEST - 2 VIEW COMPARISON:  Chest x-ray 06/20/2021.  Chest CT 06/27/2021. FINDINGS: The heart  size and mediastinal contours are within normal limits. Lungs are hyperinflated compatible with emphysema, unchanged. Surgical clips overlie the mid left chest, unchanged. There is no focal lung infiltrate, pleural effusion or pneumothorax. Skeletal structures are unremarkable. IMPRESSION: 1. No acute cardiopulmonary process. 2.  Emphysema (ICD10-J43.9). Electronically Signed   By: Ronney Asters M.D.   On: 09/13/2021 16:56    ASSESSMENT & PLAN:   No problem-specific Assessment & Plan notes found for this encounter.    All questions were answered. The patient/family knows to call the clinic with any problems, questions or concerns.       Cammie Sickle, MD 10/11/2021 9:37 AM

## 2021-10-15 ENCOUNTER — Other Ambulatory Visit: Payer: Self-pay | Admitting: Internal Medicine

## 2021-10-15 NOTE — Telephone Encounter (Signed)
Requested medication (s) are due for refill today -no  Requested medication (s) are on the active medication list -yes  Future visit scheduled -no  Last refill: 6/16/223 18 each 2RF  Notes to clinic: Pharmacy request change: [facility] preferred by insurance   Requested Prescriptions  Pending Prescriptions Disp Refills   albuterol (VENTOLIN HFA) 108 (90 Base) MCG/ACT inhaler [Pharmacy Med Name: ALBUTEROL HFA ([facility]) INHALER]  0    Sig: Take 2 puffs by mouth every 6 hours as needed for wheezing or shortness of breath Strength: 108 (90 Base) MCG/ACT     Pulmonology:  Beta Agonists 2 Passed - 10/15/2021  7:40 AM      Passed - Last BP in normal range    BP Readings from Last 1 Encounters:  09/18/21 92/60         Passed - Last Heart Rate in normal range    Pulse Readings from Last 1 Encounters:  09/18/21 91         Passed - Valid encounter within last 12 months    Recent Outpatient Visits           3 months ago Preoperative evaluation to rule out surgical contraindication   [place] Ladell Pier, MD   10 months ago Cocaine substance abuse [facility])   [place], [place]   2 years ago Moderate persistent reactive airway disease without complication   [place], MD   2 years ago Other bursal cyst, right elbow   [place], MD   2 years ago Primary osteoarthritis of right shoulder   [facility] at [facility], [name], MD       Future Appointments             In 1 month [place] Blinda [place]. [facility]               Requested Prescriptions  Pending Prescriptions Disp Refills   albuterol (VENTOLIN HFA) 108 (90 Base) MCG/ACT inhaler [Pharmacy Med Name: ALBUTEROL HFA (PROAIR) INHALER]  0    Sig: Take 2 puffs by mouth every 6 hours as  needed for wheezing or shortness of breath Strength: 108 (90 Base) MCG/ACT     Pulmonology:  Beta Agonists 2 Passed - 10/15/2021  7:40 AM      Passed - Last BP in normal range    BP Readings from Last 1 Encounters:  09/18/21 92/60         Passed - Last Heart Rate in normal range    Pulse Readings from Last 1 Encounters:  09/18/21 91         Passed - Valid encounter within last 12 months    Recent Outpatient Visits           3 months ago Preoperative evaluation to rule out surgical contraindication   [place] Ladell Pier, MD   10 months ago Cocaine substance abuse [facility])   [place] Kar[place] Plumber B, MD   2 years ago Moderate persistent reactive airway disease without complication   [place] Ladell Pier, MD   2 years ago Other bursal cyst, right elbow   [place] Ladell Pier, MD  2 years ago Primary osteoarthritis of right shoulder   [facility] at [facility], [name], MD       Future Appointments             In 1 month [place] Bl[place]. [place]

## 2021-10-16 MED ORDER — ALBUTEROL SULFATE HFA 108 (90 BASE) MCG/ACT IN AERS: 2.0000 | INHALATION_SPRAY | Freq: Four times a day (QID) | RESPIRATORY_TRACT | 2 refills | Status: DC | PRN

## 2021-10-22 ENCOUNTER — Other Ambulatory Visit (HOSPITAL_COMMUNITY): Payer: Self-pay

## 2021-10-30 ENCOUNTER — Telehealth: Payer: Self-pay | Admitting: Internal Medicine

## 2021-10-30 ENCOUNTER — Other Ambulatory Visit (HOSPITAL_COMMUNITY): Payer: Self-pay

## 2021-10-30 NOTE — Telephone Encounter (Signed)
Patient called to request a virtual visit. She did not show for her appointment 8/4 and would like to reschedule. Please advise. Thank you

## 2021-10-31 ENCOUNTER — Other Ambulatory Visit (HOSPITAL_COMMUNITY): Payer: Self-pay

## 2021-11-02 ENCOUNTER — Other Ambulatory Visit: Payer: Self-pay | Admitting: Rheumatology

## 2021-11-07 ENCOUNTER — Other Ambulatory Visit (HOSPITAL_COMMUNITY): Payer: Self-pay

## 2021-11-07 NOTE — Telephone Encounter (Signed)
Patient Advocate Encounter  Received notification that prior authorization for OTREXUP is required.   PA submitted on 8.31.23 Key [id number] Status is pending    Luciano Cutter, CPhT Patient Advocate Phone: [phone]

## 2021-11-12 ENCOUNTER — Other Ambulatory Visit (HOSPITAL_COMMUNITY): Payer: Self-pay

## 2021-11-12 NOTE — Telephone Encounter (Signed)
Received notification from [facility] regarding a prior authorization for [place]. Authorization has been APPROVED from 11/07/21 to 03/09/22.   Per test claim, copay for 28 days supply is $0  Patient can fill through [place]: [phone]   Authorization # [id number]  Knox Saliva, PharmD, MPH, BCPS, CPP Clinical Pharmacist (Rheumatology and Pulmonology)

## 2021-11-13 NOTE — Progress Notes (Deleted)
Office Visit Note  Patient: [name]             Date of Birth: 04-09-1960           MRN: [id number]             PCP: Ladell Pier, MD Referring: Ladell Pier, MD Visit Date: 11/27/2021 Occupation: '@GUAROCC'$ @  Subjective:  No chief complaint on file.   History of Present Illness: [name] is a 60 y.o. female ***   Activities of Daily Living:  Patient reports morning stiffness for *** {minute/hour:19697}.   Patient {ACTIONS;DENIES/REPORTS:21021675::"Denies"} nocturnal pain.  Difficulty dressing/grooming: {ACTIONS;DENIES/REPORTS:21021675::"Denies"} Difficulty climbing stairs: {ACTIONS;DENIES/REPORTS:21021675::"Denies"} Difficulty getting out of chair: {ACTIONS;DENIES/REPORTS:21021675::"Denies"} Difficulty using hands for taps, buttons, cutlery, and/or writing: {ACTIONS;DENIES/REPORTS:21021675::"Denies"}  No Rheumatology ROS completed.   PMFS History:  Patient Active Problem List   Diagnosis Date Noted  . COPD GOLD  3 07/31/2021  . Solitary pulmonary nodule on lung CT 07/31/2021  . Malignant neoplasm of upper-outer quadrant of left breast in female, estrogen receptor positive ([place]) 09/15/2019  . 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 Adenomatous colon polyp 03/16/2019  . Degenerative joint disease of shoulder region 06/08/2018  . Rheumatoid arthritis involving both hands with positive rheumatoid factor ([facility]) 06/08/2018  . Rheumatoid arthritis involving both feet with positive rheumatoid factor ([place]) 06/08/2018  . 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 Status post total hip replacement, 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  . Smoker 06/08/2018  . Anxiety and depression 06/08/2018  . Family history of breast cancer 03/25/2018  . 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 Rheumatoid arthritis involving multiple sites ([place]) 01/15/2018  . Tobacco dependence 01/15/2018  . Opioid use agreement exists 01/15/2018  . Positive depression screening 01/15/2018  . Homeless  01/15/2018    Past Medical History:  Diagnosis Date  . Anemia    during pregnancy  . Anxiety   . Asthma   . Bronchitis age 55  . Cancer ([place])    left breast ca  . COPD (chronic obstructive pulmonary disease) ([place])   . Depression   . Hx of bladder infections    been over 5 years since last one   . Malignant neoplasm of upper-outer quadrant of left breast in female, estrogen receptor positive ([place])   . Rheumatoid arthritis ([place])    unc rheum   . Tobacco abuse   . Trouble in sleeping     Family History  Problem Relation Age of Onset  . Breast cancer Mother 33       metastatic  . Cervical cancer Mother   . Lung cancer Brother        maternal half brother; d. 80s  . Lung cancer Maternal Uncle        d. 62s  . Lung cancer Maternal Uncle        d. 49  . Cancer Maternal Grandmother        unknown type; dx 90s  . Healthy Son   . Breast cancer Maternal Great-grandmother        dx unknown age   Past Surgical History:  Procedure Laterality Date  . BREAST LUMPECTOMY WITH RADIOACTIVE SEED AND SENTINEL LYMPH NODE BIOPSY Left 08/30/2019   Procedure: LEFT BREAST LUMPECTOMY X 2 WITH RADIOACTIVE SEED AND SENTINEL LYMPH NODE BIOPSY;  Surgeon: [name], MD;  Location: [facility];  Service: General;  Laterality: Left;  PEC BLOCK  . COLONOSCOPY WITH PROPOFOL N/A 03/15/2019   Procedure: COLONOSCOPY WITH PROPOFOL;  Surgeon: Jonathon Bellows, MD;  Location: [facility];  Service: Gastroenterology;  Laterality: N/A;  . HIP ARTHROPLASTY    . JOINT REPLACEMENT    . MULTIPLE TOOTH EXTRACTIONS    . RE-EXCISION OF BREAST LUMPECTOMY Left 10/07/2019   Procedure: RE-EXCISION OF LEFT BREAST INFERIOR MARGIN;  Surgeon: [name], MD;  Location: [facility];  Service: General;  Laterality: Left;  . TOTAL HIP ARTHROPLASTY Left 02/15/2018   Procedure: TOTAL HIP ARTHROPLASTY ANTERIOR APPROACH;  Surgeon: [name], MD;  [facility];  Service: Orthopedics;  Laterality: Left;  . WISDOM  TOOTH EXTRACTION     Social History   Social History Narrative  . Not on file   Immunization History  Administered Date(s) Administered  . Influenza,inj,Quad PF,6+ Mos 12/18/2020  . PFIZER(Purple Top)SARS-COV-2 Vaccination 06/27/2019, 07/19/2019  . Pneumococcal Conjugate-13 07/14/2018  . Tdap 03/25/2018  . Zoster Recombinat (Shingrix) 07/14/2018     Objective: Vital Signs: There were no vitals taken for this visit.   Physical Exam   Musculoskeletal Exam: ***  CDAI Exam: CDAI Score: -- Patient Global: --; Provider Global: -- Swollen: --; Tender: -- Joint Exam 11/27/2021   No joint exam has been documented for this visit   There is currently no information documented on the homunculus. Go to the Rheumatology activity and complete the homunculus joint exam.  Investigation: No additional findings.  Imaging: No results found.  Recent Labs: Lab Results  Component Value Date   WBC 5.8 08/19/2021   HGB 14.9 08/19/2021   PLT 209 08/19/2021   NA 141 08/19/2021   K 4.9 08/19/2021   CL 102 08/19/2021   CO2 30 08/19/2021   GLUCOSE 92 08/19/2021   BUN 22 08/19/2021   CREATININE 0.85 08/19/2021   BILITOT 0.4 08/19/2021   ALKPHOS 66 12/14/2020   AST 14 08/19/2021   ALT 10 08/19/2021   PROT 6.3 08/19/2021   ALBUMIN 4.4 12/14/2020   CALCIUM 9.4 08/19/2021   GFRAA 101 07/30/2020   QFTBGOLDPLUS NEGATIVE 08/19/2021    Speciality Comments: No specialty comments available.  Procedures:  No procedures performed Allergies: Other   Assessment / Plan:     Visit Diagnoses: No diagnosis found.  Orders: No orders of the defined types were placed in this encounter.  No orders of the defined types were placed in this encounter.   Face-to-face time spent with patient was *** minutes. Greater than 50% of time was spent in counseling and coordination of care.  Follow-Up Instructions: No follow-ups on file.   Earnestine Mealing, CMA  Note - This record has been created  using Editor, commissioning.  Chart creation errors have been sought, but may not always  have been located. Such creation errors do not reflect on  the standard of medical care.

## 2021-11-15 ENCOUNTER — Ambulatory Visit: Payer: Self-pay | Admitting: *Deleted

## 2021-11-15 ENCOUNTER — Other Ambulatory Visit (HOSPITAL_COMMUNITY): Payer: Self-pay

## 2021-11-15 NOTE — Telephone Encounter (Signed)
There isn't any onboarding tasks in [place] for the [facility]. There is only a note about it under Enbrel. Does the Call Center set that up?

## 2021-11-15 NOTE — Telephone Encounter (Signed)
Summary: Vaccine questions, has symptoms   Pt has symptoms; sneezing, coughing, congestion. Has questions about getting a pneumonia vaccine   Best contact: [phone]   She is going to test herself to see if she has covid.       Called patient to review sx and vaccine questions. Patient reports she does have sneezing , cough, congestion and will be taking a covid test today. Did not call regarding sx but would like to know from PCP , if she has received a pneumonia vaccine and when she can get one. Reports her pharmacist suggested pneumonia vaccine. In review of chart no pneumonia vaccine documented. Please advise .       Reason for Disposition  [1] Caller requesting NON-URGENT health information AND [2] PCP's office is the best resource  Answer Assessment - Initial Assessment Questions 1. REASON FOR CALL or QUESTION: "What is your reason for calling today?" or "How can I best help you?" or "What question do you have that I can help answer?"     Requesting if pneumonia vaccine is needed and when to get it?  Protocols used: Information Only Call - No Triage-A-AH

## 2021-11-15 NOTE — Telephone Encounter (Signed)
Spoke w pt and advised her to continue taking MTX tabs until Otrexup is sent out with her next Enbrel shipment . This will help minimize the number of shipments she receives and will synchronize her fills. She verbalized understanding and appreciation.  Knox Saliva, PharmD, MPH, BCPS, CPP Clinical Pharmacist (Rheumatology and Pulmonology)

## 2021-11-22 ENCOUNTER — Other Ambulatory Visit (HOSPITAL_COMMUNITY): Payer: Self-pay

## 2021-11-25 ENCOUNTER — Other Ambulatory Visit (HOSPITAL_COMMUNITY): Payer: Self-pay

## 2021-11-25 ENCOUNTER — Telehealth: Payer: Medicare Other | Admitting: Internal Medicine

## 2021-11-27 ENCOUNTER — Ambulatory Visit: Payer: Medicare Other | Attending: Physician Assistant | Admitting: Physician Assistant

## 2021-11-27 ENCOUNTER — Other Ambulatory Visit (HOSPITAL_COMMUNITY): Payer: Self-pay

## 2021-11-27 DIAGNOSIS — J438 Other emphysema: Secondary | ICD-10-CM

## 2021-11-27 DIAGNOSIS — Z96642 Presence of left artificial hip joint: Secondary | ICD-10-CM

## 2021-11-27 DIAGNOSIS — Z803 Family history of malignant neoplasm of breast: Secondary | ICD-10-CM

## 2021-11-27 DIAGNOSIS — R911 Solitary pulmonary nodule: Secondary | ICD-10-CM

## 2021-11-27 DIAGNOSIS — M24522 Contracture, left elbow: Secondary | ICD-10-CM

## 2021-11-27 DIAGNOSIS — F17200 Nicotine dependence, unspecified, uncomplicated: Secondary | ICD-10-CM

## 2021-11-27 DIAGNOSIS — F419 Anxiety disorder, unspecified: Secondary | ICD-10-CM

## 2021-11-27 DIAGNOSIS — D0512 Intraductal carcinoma in situ of left breast: Secondary | ICD-10-CM

## 2021-11-27 DIAGNOSIS — M0579 Rheumatoid arthritis with rheumatoid factor of multiple sites without organ or systems involvement: Secondary | ICD-10-CM

## 2021-11-27 DIAGNOSIS — Z79899 Other long term (current) drug therapy: Secondary | ICD-10-CM

## 2021-11-27 DIAGNOSIS — M19211 Secondary osteoarthritis, right shoulder: Secondary | ICD-10-CM

## 2021-12-04 NOTE — Progress Notes (Deleted)
Office Visit Note  Patient: [name]             Date of Birth: 1960-09-16           MRN: [id number]             PCP: Ladell Pier, MD Referring: Ladell Pier, MD Visit Date: 12/18/2021 Occupation: '@GUAROCC'$ @  Subjective:    History of Present Illness: [name] is a 61 y.o. female with history of seropositive rheumatoid arthritis and osteoarthritis. She is currently Enbrel 50 mg sq injections once weekly, Otrexup 20 mg sq injections once weekly, and folic acid 2 mg daily.  CBC and CMP updated on 08/19/21. Orders for CBC and CMP released today.  Her next lab work will be due in January and every 3 months.  TB gold negative 08/19/21.   Discussed the importance of holding enbrel and otrexup if she develops signs or symptoms of an infection and to resume once the infection has completely cleared.   Activities of Daily Living:  Patient reports morning stiffness for *** {minute/hour:19697}.   Patient {ACTIONS;DENIES/REPORTS:21021675::"Denies"} nocturnal pain.  Difficulty dressing/grooming: {ACTIONS;DENIES/REPORTS:21021675::"Denies"} Difficulty climbing stairs: {ACTIONS;DENIES/REPORTS:21021675::"Denies"} Difficulty getting out of chair: {ACTIONS;DENIES/REPORTS:21021675::"Denies"} Difficulty using hands for taps, buttons, cutlery, and/or writing: {ACTIONS;DENIES/REPORTS:21021675::"Denies"}  No Rheumatology ROS completed.   PMFS History:  Patient Active Problem List   Diagnosis Date Noted   COPD GOLD  3 07/31/2021   Solitary pulmonary nodule on lung CT 07/31/2021   Malignant neoplasm of upper-outer quadrant of left breast in female, estrogen receptor positive ([place]) 09/15/2019   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   Adenomatous colon polyp 03/16/2019   Degenerative joint disease of shoulder region 06/08/2018   Rheumatoid arthritis involving both hands with positive rheumatoid factor ([place]) 06/08/2018   Rheumatoid arthritis involving both feet with  positive rheumatoid factor ([place]) 06/08/2018   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   Status post total hip replacement, 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   Smoker 06/08/2018   Anxiety and depression 06/08/2018   Family history of breast cancer 03/25/2018   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   Rheumatoid arthritis involving multiple sites ([place]) 01/15/2018   Tobacco dependence 01/15/2018   Opioid use agreement exists 01/15/2018   Positive depression screening 01/15/2018   Homeless 01/15/2018    Past Medical History:  Diagnosis Date   Anemia    during pregnancy   Anxiety    Asthma    Bronchitis age 72   Cancer ([place])    left breast ca   COPD (chronic obstructive pulmonary disease) ([place])    Depression    Hx of bladder infections    been over 5 years since last one    Malignant neoplasm of upper-outer quadrant of left breast in female, estrogen receptor positive ([place])    Rheumatoid arthritis ([place])    unc rheum    Tobacco abuse    Trouble in sleeping     Family History  Problem Relation Age of Onset   Breast cancer Mother 55       metastatic   Cervical cancer Mother    Lung cancer Brother        maternal half brother; d. 24s   Lung cancer Maternal Uncle        d. 77s   Lung cancer Maternal Uncle        d. 73   Cancer Maternal Grandmother        unknown type; dx 80s   Healthy Son  Breast cancer Maternal Great-grandmother        dx unknown age   Past Surgical History:  Procedure Laterality Date   BREAST LUMPECTOMY WITH RADIOACTIVE SEED AND SENTINEL LYMPH NODE BIOPSY Left 08/30/2019   Procedure: LEFT BREAST LUMPECTOMY X 2 WITH RADIOACTIVE SEED AND SENTINEL LYMPH NODE BIOPSY;  Surgeon: [name], MD;  Location: [facility];  Service: General;  Laterality: Left;  PEC BLOCK   COLONOSCOPY WITH PROPOFOL N/A 03/15/2019   Procedure: COLONOSCOPY WITH PROPOFOL;  Surgeon: Jonathon Bellows, MD;  Location: [facility];  Service: Gastroenterology;  Laterality: N/A;   HIP ARTHROPLASTY     JOINT REPLACEMENT      MULTIPLE TOOTH EXTRACTIONS     RE-EXCISION OF BREAST LUMPECTOMY Left 10/07/2019   Procedure: RE-EXCISION OF LEFT BREAST INFERIOR MARGIN;  Surgeon: [name], MD;  Location: [facility];  Service: General;  Laterality: Left;   TOTAL HIP ARTHROPLASTY Left 02/15/2018   Procedure: TOTAL HIP ARTHROPLASTY ANTERIOR APPROACH;  Surgeon: [name], MD;  [facility];  Service: Orthopedics;  Laterality: Left;   WISDOM TOOTH EXTRACTION     Social History   Social History Narrative   Not on file   Immunization History  Administered Date(s) Administered   Influenza,inj,Quad PF,6+ Mos 12/18/2020   PFIZER(Purple Top)SARS-COV-2 Vaccination 06/27/2019, 07/19/2019   Pneumococcal Conjugate-13 07/14/2018   Tdap 03/25/2018   Zoster Recombinat (Shingrix) 07/14/2018     Objective: Vital Signs: There were no vitals taken for this visit.   Physical Exam Vitals and nursing note reviewed.  Constitutional:      Appearance: She is well-developed.  HENT:     Head: Normocephalic and atraumatic.  Eyes:     Conjunctiva/sclera: Conjunctivae normal.  Cardiovascular:     Rate and Rhythm: Normal rate and regular rhythm.     Heart sounds: Normal heart sounds.  Pulmonary:     Effort: Pulmonary effort is normal.     Breath sounds: Normal breath sounds.  Abdominal:     General: Bowel sounds are normal.     Palpations: Abdomen is soft.  Musculoskeletal:     Cervical back: Normal range of motion.  Skin:    General: Skin is warm and dry.     Capillary Refill: Capillary refill takes less than 2 seconds.  Neurological:     Mental Status: She is alert and oriented to person, place, and time.  Psychiatric:        Behavior: Behavior normal.      Musculoskeletal Exam: ***  CDAI Exam: CDAI Score: -- Patient Global: --; Provider Global: -- Swollen: --; Tender: -- Joint Exam 12/18/2021   No joint exam has been documented for this visit   There is currently no information  documented on the homunculus. Go to the Rheumatology activity and complete the homunculus joint exam.  Investigation: No additional findings.  Imaging: No results found.  Recent Labs: Lab Results  Component Value Date   WBC 5.8 08/19/2021   HGB 14.9 08/19/2021   PLT 209 08/19/2021   NA 141 08/19/2021   K 4.9 08/19/2021   CL 102 08/19/2021   CO2 30 08/19/2021   GLUCOSE 92 08/19/2021   BUN 22 08/19/2021   CREATININE 0.85 08/19/2021   BILITOT 0.4 08/19/2021   ALKPHOS 66 12/14/2020   AST 14 08/19/2021   ALT 10 08/19/2021   PROT 6.3 08/19/2021   ALBUMIN 4.4 12/14/2020   CALCIUM 9.4 08/19/2021   GFRAA 101 07/30/2020   QFTBGOLDPLUS NEGATIVE 08/19/2021  Speciality Comments: No specialty comments available.  Procedures:  No procedures performed Allergies: Other   Assessment / Plan:     Visit Diagnoses: No diagnosis found.  Orders: No orders of the defined types were placed in this encounter.  No orders of the defined types were placed in this encounter.   Face-to-face time spent with patient was *** minutes. Greater than 50% of time was spent in counseling and coordination of care.  Follow-Up Instructions: No follow-ups on file.   Earnestine Mealing, CMA  Note - This record has been created using Editor, commissioning.  Chart creation errors have been sought, but may not always  have been located. Such creation errors do not reflect on  the standard of medical care.

## 2021-12-05 ENCOUNTER — Other Ambulatory Visit (HOSPITAL_COMMUNITY): Payer: Self-pay

## 2021-12-09 ENCOUNTER — Other Ambulatory Visit (HOSPITAL_COMMUNITY): Payer: Self-pay

## 2021-12-12 ENCOUNTER — Other Ambulatory Visit (HOSPITAL_COMMUNITY): Payer: Self-pay

## 2021-12-12 ENCOUNTER — Telehealth: Payer: Self-pay | Admitting: *Deleted

## 2021-12-12 NOTE — Telephone Encounter (Signed)
Patient would like to change her upcoming appointment to in person instead of virtual.

## 2021-12-13 ENCOUNTER — Ambulatory Visit: Payer: Medicare Other | Admitting: Physician Assistant

## 2021-12-17 ENCOUNTER — Other Ambulatory Visit (HOSPITAL_COMMUNITY): Payer: Self-pay

## 2021-12-18 ENCOUNTER — Ambulatory Visit: Payer: Medicare Other | Attending: Physician Assistant | Admitting: Physician Assistant

## 2021-12-18 DIAGNOSIS — Z96642 Presence of left artificial hip joint: Secondary | ICD-10-CM

## 2021-12-18 DIAGNOSIS — M19211 Secondary osteoarthritis, right shoulder: Secondary | ICD-10-CM

## 2021-12-18 DIAGNOSIS — F32A Anxiety disorder, unspecified: Secondary | ICD-10-CM

## 2021-12-18 DIAGNOSIS — J438 Other emphysema: Secondary | ICD-10-CM

## 2021-12-18 DIAGNOSIS — M0579 Rheumatoid arthritis with rheumatoid factor of multiple sites without organ or systems involvement: Secondary | ICD-10-CM

## 2021-12-18 DIAGNOSIS — M24522 Contracture, left elbow: Secondary | ICD-10-CM

## 2021-12-18 DIAGNOSIS — Z79899 Other long term (current) drug therapy: Secondary | ICD-10-CM

## 2021-12-18 DIAGNOSIS — Z803 Family history of malignant neoplasm of breast: Secondary | ICD-10-CM

## 2021-12-18 DIAGNOSIS — R911 Solitary pulmonary nodule: Secondary | ICD-10-CM

## 2021-12-18 DIAGNOSIS — F17200 Nicotine dependence, unspecified, uncomplicated: Secondary | ICD-10-CM

## 2021-12-18 DIAGNOSIS — D0512 Intraductal carcinoma in situ of left breast: Secondary | ICD-10-CM

## 2021-12-20 ENCOUNTER — Inpatient Hospital Stay: Payer: Medicare Other | Admitting: Internal Medicine

## 2021-12-20 ENCOUNTER — Telehealth: Payer: Medicare Other | Admitting: Internal Medicine

## 2021-12-23 ENCOUNTER — Inpatient Hospital Stay: Payer: Medicare Other | Attending: Internal Medicine | Admitting: Internal Medicine

## 2021-12-23 NOTE — Assessment & Plan Note (Deleted)
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  Status postlumpectomy followed by radiation.  No chemotherapy.  On adjuvant-tamoxifen 20 mg qhs [NOV, 01/2021].  Patient tolerated tamoxifen well.  Continue same.  #Incidental left axillary lymphadenopathy [on lung cancer screening]; UNBLE- status post node biopsy lymph on June 12th-  #Lung cancer screening: Left upper lobe lung "nodule"-6 mm in size vs mucous plugging.  Agree with recommendation to repeat scan in 6 months.   #Rheumatoid arthritis: [Dr.Deweshwer; [place]]-methotrexate/; Biologics stable.  #Anxiety/depression: Patient declines any worsening symptoms.  Does not want any psychiatric referral  # screening PAP: s/p [facility] re: PAP smear   # DISPOSITION:  # follow up in 2 months; no labs# PRIOR Left axillary US at breast center/[place]- -- Dr.B  

## 2021-12-25 ENCOUNTER — Other Ambulatory Visit (HOSPITAL_COMMUNITY): Payer: Self-pay

## 2021-12-30 ENCOUNTER — Other Ambulatory Visit (HOSPITAL_COMMUNITY): Payer: Self-pay

## 2022-01-01 ENCOUNTER — Telehealth: Payer: Self-pay | Admitting: Internal Medicine

## 2022-01-01 DIAGNOSIS — R911 Solitary pulmonary nodule: Secondary | ICD-10-CM

## 2022-01-02 ENCOUNTER — Other Ambulatory Visit (HOSPITAL_COMMUNITY): Payer: Self-pay

## 2022-01-02 ENCOUNTER — Other Ambulatory Visit: Payer: Self-pay | Admitting: Rheumatology

## 2022-01-02 MED ORDER — ENBREL MINI 50 MG/ML ~~LOC~~ SOCT
50.0000 mg | SUBCUTANEOUS | 0 refills | Status: DC
  Filled 2022-01-02: qty 4, 28d supply, fill #0

## 2022-01-02 NOTE — Telephone Encounter (Signed)
Please call patient to schedule appt. Thank you.  Return in about 3 months (around 11/28/2021) for Rheumatoid arthritis, Osteoarthritis.

## 2022-01-02 NOTE — Telephone Encounter (Signed)
LMOM for patient to call and schedule follow-up appointment.   °

## 2022-01-02 NOTE — Telephone Encounter (Signed)
Next Visit: message sent to front desk to schedule appt, Return in about 3 months (around 11/28/2021) for Rheumatoid arthritis, Osteoarthritis.  Last Visit: 08/28/2021  Last Fill: 08/28/2021  WN:PIOPPUGGPC arthritis involving multiple sites with positive rheumatoid factor   Current Dose per office note 08/28/2021: Enbrel 50 mg sq injections once weekly  Labs: 08/19/2021, CBC and CMP WNL LMOM labs are due.  TB Gold: 08/19/2021 TB gold negative.  Okay to refill Enbrel?

## 2022-01-03 ENCOUNTER — Encounter: Payer: Self-pay | Admitting: Internal Medicine

## 2022-01-03 ENCOUNTER — Inpatient Hospital Stay: Payer: Medicare Other | Attending: Internal Medicine | Admitting: Internal Medicine

## 2022-01-03 DIAGNOSIS — Z170 Estrogen receptor positive status [ER+]: Secondary | ICD-10-CM

## 2022-01-03 DIAGNOSIS — C50412 Malignant neoplasm of upper-outer quadrant of left female breast: Secondary | ICD-10-CM

## 2022-01-03 MED ORDER — TAMOXIFEN CITRATE 20 MG PO TABS: 20.0000 mg | ORAL_TABLET | Freq: Every day | ORAL | 1 refills | Status: DC

## 2022-01-03 NOTE — Assessment & Plan Note (Addendum)
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  Status postlumpectomy followed by radiation.  No chemotherapy.  On adjuvant-tamoxifen 20 mg qhs [NOV, 01/2021].  Patient tolerated tamoxifen well.  Continue same-refill sent.  See discussion below  # Incidental left axillary lymphadenopathy [on lung cancer screening]; UNABLE-to perform biopsy left axilla lymph node [MAY/ June 12th, 2023]-because of blood vessels.  JULY 2023- Abnormal LN-await annual bilateral mammogram is due in late September of 2023. The LEFT axilla can be re-evaluated at that time as well.  We will consider repeating ultrasound in 6 months from the mammogram.  #Lung cancer screening: Left upper lobe lung "nodule"-6 mm in size vs mucous plugging.  Agree with recommendation to repeat scan in 6 months; awaiting CT scan chest with PCP.  Patient agrees to call PCP to have it scheduled.  #Rheumatoid arthritis: [Dr.Deweshwer; [place]]-methotrexate/; Biologics stable.  # DISPOSITION:  # Mammogram BIL Diagnostic in 1-2 weeks-  #  follow up in 6 months; MD;  no labs- Dr.B

## 2022-01-03 NOTE — Progress Notes (Deleted)
Office Visit Note  Patient: [name]             Date of Birth: 1960/10/07           MRN: [id number]             PCP: Ladell Pier, MD Referring: Ladell Pier, MD Visit Date: 01/06/2022 Occupation: '@GUAROCC'$ @  Subjective:  No chief complaint on file.   History of Present Illness: [name] is a 61 y.o. female ***   Activities of Daily Living:  Patient reports morning stiffness for *** {minute/hour:19697}.   Patient {ACTIONS;DENIES/REPORTS:21021675::"Denies"} nocturnal pain.  Difficulty dressing/grooming: {ACTIONS;DENIES/REPORTS:21021675::"Denies"} Difficulty climbing stairs: {ACTIONS;DENIES/REPORTS:21021675::"Denies"} Difficulty getting out of chair: {ACTIONS;DENIES/REPORTS:21021675::"Denies"} Difficulty using hands for taps, buttons, cutlery, and/or writing: {ACTIONS;DENIES/REPORTS:21021675::"Denies"}  No Rheumatology ROS completed.   PMFS History:  Patient Active Problem List   Diagnosis Date Noted  . COPD GOLD  3 07/31/2021  . Solitary pulmonary nodule on lung CT 07/31/2021  . Malignant neoplasm of upper-outer quadrant of left breast in female, estrogen receptor positive ([place]) 09/15/2019  . 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 Adenomatous colon polyp 03/16/2019  . Degenerative joint disease of shoulder region 06/08/2018  . Rheumatoid arthritis involving both hands with positive rheumatoid factor ([place]) 06/08/2018  . Rheumatoid arthritis involving both feet with positive rheumatoid factor ([place]) 06/08/2018  . 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 Status post total hip replacement, 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  . Smoker 06/08/2018  . Anxiety and depression 06/08/2018  . Family history of breast cancer 03/25/2018  . 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 Rheumatoid arthritis involving multiple sites ([place]) 01/15/2018  . Tobacco dependence 01/15/2018  . Opioid use agreement exists 01/15/2018  . Positive depression screening 01/15/2018  . Homeless  01/15/2018    Past Medical History:  Diagnosis Date  . Anemia    during pregnancy  . Anxiety   . Asthma   . Bronchitis age 19  . Cancer ([place])    left breast ca  . COPD (chronic obstructive pulmonary disease) ([place])   . Depression   . Hx of bladder infections    been over 5 years since last one   . Malignant neoplasm of upper-outer quadrant of left breast in female, estrogen receptor positive ([place])   . Rheumatoid arthritis ([place])    unc rheum   . Tobacco abuse   . Trouble in sleeping     Family History  Problem Relation Age of Onset  . Breast cancer Mother 80       metastatic  . Cervical cancer Mother   . Lung cancer Brother        maternal half brother; d. 71s  . Lung cancer Maternal Uncle        d. 38s  . Lung cancer Maternal Uncle        d. 79  . Cancer Maternal Grandmother        unknown type; dx 90s  . Healthy Son   . Breast cancer Maternal Great-grandmother        dx unknown age   Past Surgical History:  Procedure Laterality Date  . BREAST LUMPECTOMY WITH RADIOACTIVE SEED AND SENTINEL LYMPH NODE BIOPSY Left 08/30/2019   Procedure: LEFT BREAST LUMPECTOMY X 2 WITH RADIOACTIVE SEED AND SENTINEL LYMPH NODE BIOPSY;  Surgeon: [name], MD;  Location: [facility];  Service: General;  Laterality: Left;  PEC BLOCK  . COLONOSCOPY WITH PROPOFOL N/A 03/15/2019   Procedure: COLONOSCOPY WITH PROPOFOL;  Surgeon: Jonathon Bellows, MD;  Location: [facility] END[facility]COPY;  Service: Gastroenterology;  Laterality: N/A;  . HIP ARTHROPLASTY    . JOINT REPLACEMENT    . MULTIPLE TOOTH EXTRACTIONS    . RE-EXCISION OF BREAST LUMPECTOMY Left 10/07/2019   Procedure: RE-EXCISION OF LEFT BREAST INFERIOR MARGIN;  Surgeon: [name], MD;  Location: [facility];  Service: General;  Laterality: Left;  . TOTAL HIP ARTHROPLASTY Left 02/15/2018   Procedure: TOTAL HIP ARTHROPLASTY ANTERIOR APPROACH;  Surgeon: [name], MD;  [facility];  Service: Orthopedics;  Laterality: Left;  . WISDOM  TOOTH EXTRACTION     Social History   Social History Narrative  . Not on file   Immunization History  Administered Date(s) Administered  . Influenza,inj,Quad PF,6+ Mos 12/18/2020  . PFIZER(Purple Top)SARS-COV-2 Vaccination 06/27/2019, 07/19/2019  . Pneumococcal Conjugate-13 07/14/2018  . Tdap 03/25/2018  . Zoster Recombinat (Shingrix) 07/14/2018     Objective: Vital Signs: There were no vitals taken for this visit.   Physical Exam   Musculoskeletal Exam: ***  CDAI Exam: CDAI Score: -- Patient Global: --; Provider Global: -- Swollen: --; Tender: -- Joint Exam 01/06/2022   No joint exam has been documented for this visit   There is currently no information documented on the homunculus. Go to the Rheumatology activity and complete the homunculus joint exam.  Investigation: No additional findings.  Imaging: No results found.  Recent Labs: Lab Results  Component Value Date   WBC 5.8 08/19/2021   HGB 14.9 08/19/2021   PLT 209 08/19/2021   NA 141 08/19/2021   K 4.9 08/19/2021   CL 102 08/19/2021   CO2 30 08/19/2021   GLUCOSE 92 08/19/2021   BUN 22 08/19/2021   CREATININE 0.85 08/19/2021   BILITOT 0.4 08/19/2021   ALKPHOS 66 12/14/2020   AST 14 08/19/2021   ALT 10 08/19/2021   PROT 6.3 08/19/2021   ALBUMIN 4.4 12/14/2020   CALCIUM 9.4 08/19/2021   GFRAA 101 07/30/2020   QFTBGOLDPLUS NEGATIVE 08/19/2021    Speciality Comments: No specialty comments available.  Procedures:  No procedures performed Allergies: Other   Assessment / Plan:     Visit Diagnoses: No diagnosis found.  Orders: No orders of the defined types were placed in this encounter.  No orders of the defined types were placed in this encounter.   Face-to-face time spent with patient was *** minutes. Greater than 50% of time was spent in counseling and coordination of care.  Follow-Up Instructions: No follow-ups on file.   Earnestine Mealing, CMA  Note - This record has been created  using Editor, commissioning.  Chart creation errors have been sought, but may not always  have been located. Such creation errors do not reflect on  the standard of medical care.

## 2022-01-03 NOTE — Progress Notes (Signed)
Patient states no concerns. 

## 2022-01-03 NOTE — Progress Notes (Signed)
[facility] [place] NOTE  Patient Care Team: Ladell Pier, MD as PCP - General (Internal Medicine) [name], MD as Consulting Physician (General Surgery) Bo Merino, MD as Consulting Physician (Rheumatology) Beverely Pace, LCSW (Inactive) as Social Worker ([place]) Su Monks as Physician Assistant (Physician Assistant) Cammie Sickle, MD as Consulting Physician (Oncology)  CHIEF COMPLAINTS/PURPOSE OF CONSULTATION: Breast cancer  #  Oncology History Overview Note  2021- FINAL MICROSCOPIC DIAGNOSIS:   A. BREAST, LEFT, LUMPECTOMY:  - Invasive ductal carcinoma, 1.5 cm.  - Ductal carcinoma in situ with calcifications.  - Invasive carcinoma 0.4 cm from posterior margin.  - DCIS focally involves inferior margin.  - Fibrocystic changes with sclerosing adenosis and calcifications.  - See oncology table.   B. LYMPH NODE, LEFT AXILLARY, SENTINEL, BIOPSY:  - One lymph node with no metastatic carcinoma (0/1).   C. LYMPH NODE, LEFT AXILLARY, SENTINEL, BIOPSY:  - One lymph node with no metastatic carcinoma (0/1).   D. LYMPH NODE, LEFT AXILLARY, SENTINEL, BIOPSY:  - One lymph node with no metastatic carcinoma (0/1).   E. LYMPH NODE, LEFT AXILLARY, SENTINEL, BIOPSY:  - One lymph node with no metastatic carcinoma (0/1).   F. BREAST, LEFT ADDITIONAL MEDIAL MARGIN, EXCISION:  - Fibrocystic changes with sclerosing adenosis and calcifications.  - No malignancy identified.  - Final medial margin clear.   G. BREAST, LEFT ADDITIONAL SUPERIOR MARGIN, EXCISION:  - Fibrocystic changes with sclerosing adenosis and calcifications.  - No malignancy identified.  - Final superior margin clear.   ONCOLOGY TABLE:  INVASIVE CARCINOMA OF THE BREAST:  Resection  Procedure: Left breast lumpectomy, 4 left axillary sentinel lymph nodes  with additional medial margin and additional superior margin biopsies.  Specimen Laterality: Left breast.   Tumor Size: 1.5 cm.  Histologic Type: Ductal.  Histologic Grade:       Glandular (Acinar)/Tubular Differentiation: 1.       Nuclear Pleomorphism: 1.       Mitotic Rate: 1.       Overall Grade: 1  Ductal Carcinoma In Situ: Present.  Margins: Free of invasive carcinoma.       Distance from closest margin (millimeters): 4 mm.       Specify closest margin (required only if <23m): Posterior.  DCIS Margins: Focally involves inferior margin.       Distance from closest margin (millimeters): 0 mm.       Specify closest margin (required only if <122m: Inferior.  Regional Lymph Nodes:       Number of Lymph Nodes Examined: 4       Number of Sentinel Nodes Examined (if applicable): 4       Number of Lymph Nodes with Macrometastases (>2 mm): 0       Number of Lymph Nodes with Micrometastases: 0       Number of Lymph Nodes with Isolated Tumor Cells (=0.2 mm or =200  cells): 0  Treatment Effect:  No known presurgical therapy  Breast Biomarker Testing Performed on Previous Biopsy:        Testing Performed on Case Number: [id number].             Estrogen Receptor: 95%, positive, strong staining.             Progesterone Receptor: 2%, positive, strong staining.             HER2: Negative with IHC.  Ki-67: 1%.    [name]  06/13/2019 Cancer Staging   Staging form: Breast, AJCC 8th Edition - Clinical stage from 06/13/2019: Stage 0 (cTis (DCIS), cN0, cM0, ER+, PR+)   06/15/2019 Initial Diagnosis   [name]   Malignant neoplasm of upper-outer quadrant of left breast in female, estrogen receptor positive ([place])  09/15/2019 Initial Diagnosis   Malignant neoplasm of upper-outer quadrant of left breast in female, estrogen receptor positive ([place])      HISTORY OF PRESENTING ILLNESS: Alone.  Ambulating independently. [name] 61 y.o.  female with a history of LEFT breast cancer stage I  ER/PR positive HER2  negative; history of smoking; longstanding anxiety/depression here for follow-up/review her lymph node left axillary is here for follow-up/review results of the ultrasound of the left underarm.  Patient states no concerns.  Patient denies any new onset of any lumps or bumps.  Denies any unusual shortness of breath or cough.  She is gaining weight.  She seems to be tolerating tamoxifen fairly well.  Review of Systems  Constitutional:  Positive for malaise/fatigue. Negative for chills, diaphoresis, fever and weight loss.  HENT:  Negative for nosebleeds and sore throat.   Eyes:  Negative for double vision.  Respiratory:  Negative for cough, hemoptysis, sputum production, shortness of breath and wheezing.   Cardiovascular:  Negative for chest pain, palpitations, orthopnea and leg swelling.  Gastrointestinal:  Negative for abdominal pain, blood in stool, constipation, diarrhea, heartburn, melena, nausea and vomiting.  Genitourinary:  Negative for dysuria, frequency and urgency.  Musculoskeletal:  Positive for back pain, joint pain and neck pain.  Skin: Negative.  Negative for itching and rash.  Neurological:  Negative for dizziness, tingling, focal weakness, weakness and headaches.  Endo/Heme/Allergies:  Does not bruise/bleed easily.  Psychiatric/Behavioral:  Negative for depression. The patient is nervous/anxious. The patient does not have insomnia.      MEDICAL HISTORY:  Past Medical History:  Diagnosis Date   Anemia    during pregnancy   Anxiety    Asthma    Bronchitis age 60   Cancer ([place])    left breast ca   COPD (chronic obstructive pulmonary disease) (HCC)    Depression    Hx of bladder infections    been over 5 years since last one    Malignant neoplasm of upper-outer quadrant of left breast in female, estrogen receptor positive ([place])    Rheumatoid arthritis ([place])    unc rheum    Tobacco abuse    Trouble in sleeping     SURGICAL HISTORY: Past Surgical History:   Procedure Laterality Date   BREAST LUMPECTOMY WITH RADIOACTIVE SEED AND SENTINEL LYMPH NODE BIOPSY Left 08/30/2019   Procedure: LEFT BREAST LUMPECTOMY X 2 WITH RADIOACTIVE SEED AND SENTINEL LYMPH NODE BIOPSY;  Surgeon: [name], MD;  Location: [facility];  Service: General;  Laterality: Left;  PEC BLOCK   COLONOSCOPY WITH PROPOFOL N/A 03/15/2019   Procedure: COLONOSCOPY WITH PROPOFOL;  Surgeon: Jonathon Bellows, MD;  Location[facility] ENDOSCOPY;  Service: Gastroenterology;  Laterality: N/A;   HIP ARTHROPLASTY     JOINT REPLACEMENT     MULTIPLE TOOTH EXTRACTIONS     RE-EXCISION OF BREAST LUMPECTOMY Left 10/07/2019   Procedure: RE-EXCISION OF LEFT BREAST INFERIOR MARGIN;  Surgeon: [name], MD;  Location: [facility];  Service: General;  Laterality: Left;   TOTAL HIP ARTHROPLASTY Left 02/15/2018   Procedure:  TOTAL HIP ARTHROPLASTY ANTERIOR APPROACH;  Surgeon: [name], MD;  Location: [facility];  Service: Orthopedics;  Laterality: Left;   WISDOM TOOTH EXTRACTION      SOCIAL HISTORY: Social History   Socioeconomic History   Marital status: Single    Spouse name: Not on file   Number of children: 1   Years of education: 14 years   Highest education level: Some college, no degree  Occupational History   Not on file  Tobacco Use   Smoking status: Former    Packs/day: 0.25    Types: Cigarettes    Quit date: 03/10/2021    Years since quitting: 0.8    Passive exposure: Past   Smokeless tobacco: Never   Tobacco comments:    on nicotine patches  Vaping Use   Vaping Use: Never used  Substance and Sexual Activity   Alcohol use: Yes    Comment: Occassionally   Drug use: Not Currently    Types: Cocaine   Sexual activity: Not Currently    Birth control/protection: Post-menopausal  Other Topics Concern   Not on file  Social History Narrative   Not on file   Social Determinants of Health   Financial Resource Strain: Medium Risk (05/27/2021)   Overall Financial Resource  Strain (CARDIA)    Difficulty of Paying Living Expenses: Somewhat hard  Food Insecurity: No Food Insecurity (08/09/2021)   Hunger Vital Sign    Worried About Running Out of Food in the Last Year: Never true    Ran Out of Food in the Last Year: Never true  Transportation Needs: No Transportation Needs (08/09/2021)   PRAPARE - Hydrologist (Medical): No    Lack of Transportation (Non-Medical): No  Recent Concern: Transportation Needs - Unmet Transportation Needs (05/27/2021)   PRAPARE - Transportation    Lack of Transportation (Medical): Yes    Lack of Transportation (Non-Medical): Yes  Physical Activity: Insufficiently Active (08/09/2021)   Exercise Vital Sign    Days of Exercise per Week: 4 days    Minutes of Exercise per Session: 30 min  Stress: Stress Concern Present (05/27/2021)   [place]    Feeling of Stress : To some extent  Social Connections: Socially Isolated (05/27/2021)   Social Connection and Isolation Panel [NHANES]    Frequency of Communication with Friends and Family: Once a week    Frequency of Social Gatherings with Friends and Family: Twice a week    Attends Religious Services: Never    Marine scientist or Organizations: No    Attends Archivist Meetings: Never    Marital Status: Divorced  Human resources officer Violence: Not At Risk (08/09/2021)   Humiliation, Afraid, Rape, and Kick questionnaire    Fear of Current or Ex-Partner: No    Emotionally Abused: No    Physically Abused: No    Sexually Abused: No    FAMILY HISTORY: Family History  Problem Relation Age of Onset   Breast cancer Mother 87       metastatic   Cervical cancer Mother    Lung cancer Brother        maternal half brother; d. 63s   Lung cancer Maternal Uncle        d. 48s   Lung cancer Maternal Uncle        d. 31   Cancer Maternal Grandmother        unknown type; dx 90s  Healthy Son     Breast cancer Maternal Great-grandmother        dx unknown age    ALLERGIES:  is allergic to other.  MEDICATIONS:  Current Outpatient Medications  Medication Sig Dispense Refill   albuterol (PROAIR HFA) 108 (90 Base) MCG/ACT inhaler Inhale 2 puffs into the lungs every 6 (six) hours as needed for wheezing or shortness of breath. 8.5 g 2   Budeson-Glycopyrrol-Formoterol (BREZTRI AEROSPHERE) 160-9-4.8 MCG/ACT AERO Inhale 2 puffs into the lungs in the morning and at bedtime. 5.9 g 0   Calcium Carb-Cholecalciferol (CALCIUM 1000 + D PO) Take by mouth.     cyanocobalamin 100 MCG tablet Take 100 mcg by mouth daily.     diazepam (VALIUM) 2 MG tablet One pill 45-60 mins prior to procedure; 1 pill 15 mins prior if needed. 3 tablet 0   Etanercept (ENBREL MINI) 50 MG/ML SOCT INJECT 50 MG INTO THE SKIN ONCE A WEEK. 4 mL 0   Glycopyrrolate-Formoterol (BEVESPI AEROSPHERE) 9-4.8 MCG/ACT AERO Inhale 2 puffs into the lungs 2 (two) times daily. 10.7 g 5   Methotrexate, PF, (OTREXUP) 20 MG/0.4ML SOAJ Inject 20 mg into the skin once a week. 1.6 mL 1   Multiple Vitamin (MULTIVITAMIN WITH MINERALS) TABS tablet Take 1 tablet by mouth daily.     nicotine (NICODERM CQ - DOSED IN MG/24 HOURS) 14 mg/24hr patch PLACE 1 PATCH ONTO THE SKIN DAILY. 28 patch 1   predniSONE (DELTASONE) 5 MG tablet Take 1 tablet by mouth daily with breakfast. 30 tablet 0   tamoxifen (NOLVADEX) 20 MG tablet Take 1 tablet (20 mg total) by mouth daily. 90 tablet 1   methotrexate (RHEUMATREX) 2.5 MG tablet Take 8 tablets (20 mg total) by mouth once a week. Caution:Chemotherapy. Protect from light. (Patient not taking: Reported on 01/03/2022) 32 tablet 2   predniSONE (DELTASONE) 5 MG tablet Take 4 tabs po x 4 days, 3  tabs po x 4 days, 2  tabs po x 4 days, 1  tab po x 4 days (Patient not taking: Reported on 01/03/2022) 40 tablet 0   No current facility-administered medications for this visit.    PHYSICAL EXAMINATION:  Vitals:   01/03/22 1353   BP: (!) 89/68  Pulse: 74  Resp: 20  Temp: 98.7 F (37.1 C)  SpO2: (!) 10%   Filed Weights   01/03/22 1353  Weight: 113 lb 12.8 oz (51.6 kg)    Physical Exam Vitals and nursing note reviewed.  HENT:     Head: Normocephalic and atraumatic.     Mouth/Throat:     Pharynx: Oropharynx is clear.  Eyes:     Extraocular Movements: Extraocular movements intact.     Pupils: Pupils are equal, round, and reactive to light.  Cardiovascular:     Rate and Rhythm: Normal rate and regular rhythm.  Pulmonary:     Comments: Decreased breath sounds bilaterally.  Abdominal:     Palpations: Abdomen is soft.  Musculoskeletal:        General: Normal range of motion.     Cervical back: Normal range of motion.  Skin:    General: Skin is warm.  Neurological:     General: No focal deficit present.     Mental Status: She is alert and oriented to person, place, and time.  Psychiatric:        Behavior: Behavior normal.        Judgment: Judgment normal.      LABORATORY DATA:  I have  reviewed the data as listed Lab Results  Component Value Date   WBC 5.8 08/19/2021   HGB 14.9 08/19/2021   HCT 44.4 08/19/2021   MCV 91.4 08/19/2021   PLT 209 08/19/2021   Recent Labs    05/22/21 1526 08/19/21 1354  NA 144 141  K 4.8 4.9  CL 104 102  CO2 31 30  GLUCOSE 99 92  BUN 19 22  CREATININE 0.85 0.85  CALCIUM 9.5 9.4  PROT 6.9 6.3  AST 15 14  ALT 13 10  BILITOT 0.4 0.4    RADIOGRAPHIC STUDIES: I have personally reviewed the radiological images as listed and agreed with the findings in the report. No results found.  ASSESSMENT & PLAN:   Malignant neoplasm of upper-outer quadrant of left breast in female, estrogen receptor positive ([place]) #ER/PR positive HER2 negative stage I breast cancer-left breast.  Status postlumpectomy followed by radiation.  No chemotherapy.  On adjuvant-tamoxifen 20 mg qhs [NOV, 01/2021].  Patient tolerated tamoxifen well.  Continue same-refill sent.  See  discussion below  # Incidental left axillary lymphadenopathy [on lung cancer screening]; UNABLE-to perform biopsy left axilla lymph node [MAY/ June 12th, 2023]-because of blood vessels.  JULY 2023- Abnormal LN-await annual bilateral mammogram is due in late September of 2023. The LEFT axilla can be re-evaluated at that time as well.  We will consider repeating ultrasound in 6 months from the mammogram.  #Lung cancer screening: Left upper lobe lung "nodule"-6 mm in size vs mucous plugging.  Agree with recommendation to repeat scan in 6 months; order CT scan.   #Rheumatoid arthritis: [Dr.Deweshwer; [place]]-methotrexate/; Biologics stable.  # DISPOSITION:  # Mammogram BIL Diagnostic in 1-2 weeks-  #  follow up in 6 months; MD;  no labs- Dr.B     All questions were answered. The patient/family knows to call the clinic with any problems, questions or concerns.       Cammie Sickle, MD 01/03/2022 7:42 PM

## 2022-01-06 ENCOUNTER — Ambulatory Visit: Payer: Medicare Other | Admitting: Physician Assistant

## 2022-01-06 DIAGNOSIS — Z79899 Other long term (current) drug therapy: Secondary | ICD-10-CM

## 2022-01-06 DIAGNOSIS — M0579 Rheumatoid arthritis with rheumatoid factor of multiple sites without organ or systems involvement: Secondary | ICD-10-CM

## 2022-01-06 DIAGNOSIS — R911 Solitary pulmonary nodule: Secondary | ICD-10-CM

## 2022-01-06 DIAGNOSIS — Z803 Family history of malignant neoplasm of breast: Secondary | ICD-10-CM

## 2022-01-06 DIAGNOSIS — Z96642 Presence of left artificial hip joint: Secondary | ICD-10-CM

## 2022-01-06 DIAGNOSIS — M19211 Secondary osteoarthritis, right shoulder: Secondary | ICD-10-CM

## 2022-01-06 DIAGNOSIS — J438 Other emphysema: Secondary | ICD-10-CM

## 2022-01-06 DIAGNOSIS — F32A Depression, unspecified: Secondary | ICD-10-CM

## 2022-01-06 DIAGNOSIS — D0512 Intraductal carcinoma in situ of left breast: Secondary | ICD-10-CM

## 2022-01-06 DIAGNOSIS — F17200 Nicotine dependence, unspecified, uncomplicated: Secondary | ICD-10-CM

## 2022-01-06 DIAGNOSIS — M24522 Contracture, left elbow: Secondary | ICD-10-CM

## 2022-01-06 NOTE — Telephone Encounter (Signed)
Pt was called and a Vm was left informing pt of appointment details.

## 2022-01-09 ENCOUNTER — Ambulatory Visit: Payer: Medicare Other | Admitting: Physician Assistant

## 2022-01-14 ENCOUNTER — Other Ambulatory Visit (HOSPITAL_COMMUNITY): Payer: Self-pay

## 2022-01-15 ENCOUNTER — Other Ambulatory Visit (HOSPITAL_COMMUNITY): Payer: Self-pay

## 2022-01-16 ENCOUNTER — Other Ambulatory Visit: Payer: Self-pay | Admitting: Internal Medicine

## 2022-01-16 DIAGNOSIS — R928 Other abnormal and inconclusive findings on diagnostic imaging of breast: Secondary | ICD-10-CM

## 2022-01-16 DIAGNOSIS — R599 Enlarged lymph nodes, unspecified: Secondary | ICD-10-CM

## 2022-01-20 ENCOUNTER — Inpatient Hospital Stay: Admission: RE | Admit: 2022-01-20 | Payer: Medicare Other | Source: Ambulatory Visit

## 2022-01-21 ENCOUNTER — Ambulatory Visit: Payer: Self-pay

## 2022-01-21 ENCOUNTER — Ambulatory Visit
Admission: RE | Admit: 2022-01-21 | Discharge: 2022-01-21 | Disposition: A | Discharge status: Home / Self Care | Payer: Medicare Other | Source: Ambulatory Visit | Attending: Internal Medicine | Admitting: Internal Medicine

## 2022-01-21 DIAGNOSIS — R911 Solitary pulmonary nodule: Secondary | ICD-10-CM | POA: Insufficient documentation

## 2022-01-21 DIAGNOSIS — J432 Centrilobular emphysema: Secondary | ICD-10-CM | POA: Diagnosis not present

## 2022-01-21 NOTE — Telephone Encounter (Signed)
  Chief Complaint: insomnia Symptoms: difficulty sleeping, staying asleep, getting 2-3 hrs of sleep per night. Fatigue  Frequency: 1-2 months Pertinent Negatives: NA Disposition: '[]'$ ED /'[x]'$ Urgent Care (no appt availability in office) / '[]'$ Appointment(In office/virtual)/ '[]'$  [place] [facility]/ '[]'$ Home Care/ '[]'$ Refused Recommended Disposition /'[]'$ [place] [facility]/ '[]'$  Follow-up with PCP Additional Notes: pt states that she is so fatigued during the day d/t unable to sleep at night. Only getting 2-3 hrs of sleep at night. Pt also reports RA so that doesn't help with fatigue. Advised no sooner appts than 02/20/22. Offered virtual UC but pt doesn't have access to Mychart and asked if she can go to UC. Scheduled appt at [facility] for tomorrow at 1315.   Summary: trouble sleeping   Pt states she is having trouble sleeping  Pts next appt is 12-14  Please fu w/ pt and advise on otc medication     Reason for Disposition  [1] Insomnia persists > 1 week AND [2] no improvement after using Care Advice  Answer Assessment - Initial Assessment Questions 1. DESCRIPTION: "Tell me about your sleeping problem."      Trouble going to sleep and staying asleep  2. ONSET: "How long have you been having trouble sleeping?" (e.g., days, weeks, months)     1-2 months  3. RECURRENT: "Have you had sleeping problems before?"  If Yes, ask: "What happened that time?" "What helped your sleeping problem go away in the past?"      Ongoing fatigue  4. STRESS: "Is there anything in your life that is making you feel stressed or tense?"     yes 5. PAIN: "Do you have any pain that is keeping you awake?" (e.g., back pain, headache, abdomen pain)     Has RA  Protocols used: Insomnia-A-AH

## 2022-01-22 ENCOUNTER — Ambulatory Visit: Payer: Self-pay

## 2022-01-24 ENCOUNTER — Telehealth: Payer: Self-pay | Admitting: Pharmacist

## 2022-01-24 NOTE — Telephone Encounter (Signed)
Submitted a Prior Authorization RENEWAL request to [facility] for ENBREL via CoverMyMeds. Will update once we receive a response.  Key: [id number]  Of note, patient has not been seen since May 2023 and no show to multiple appointments. Renewing Enbrel auth to prevent delay in therapy in case she follows-up in the next few months  Knox Saliva, PharmD, MPH, BCPS, CPP Clinical Pharmacist (Rheumatology and Pulmonology)

## 2022-01-24 NOTE — Telephone Encounter (Signed)
Received notification from [facility] regarding a prior authorization for ENBREL. Authorization has been APPROVED from 01/24/22 to 03/10/23.   Authorization # [id number]  Knox Saliva, PharmD, MPH, BCPS, CPP Clinical Pharmacist (Rheumatology and Pulmonology)

## 2022-02-06 ENCOUNTER — Other Ambulatory Visit (HOSPITAL_COMMUNITY): Payer: Self-pay

## 2022-02-10 ENCOUNTER — Other Ambulatory Visit (HOSPITAL_COMMUNITY): Payer: Self-pay

## 2022-02-10 ENCOUNTER — Ambulatory Visit
Admission: RE | Admit: 2022-02-10 | Discharge: 2022-02-10 | Disposition: A | Discharge status: Home / Self Care | Payer: Medicare Other | Source: Ambulatory Visit | Attending: Internal Medicine | Admitting: Internal Medicine

## 2022-02-10 ENCOUNTER — Ambulatory Visit
Admission: RE | Admit: 2022-02-10 | Discharge: 2022-02-10 | Disposition: A | Discharge status: Home / Self Care | Payer: Medicare Other | Source: Ambulatory Visit

## 2022-02-10 ENCOUNTER — Other Ambulatory Visit: Payer: Self-pay | Admitting: Internal Medicine

## 2022-02-10 DIAGNOSIS — Z170 Estrogen receptor positive status [ER+]: Secondary | ICD-10-CM | POA: Insufficient documentation

## 2022-02-10 DIAGNOSIS — Z853 Personal history of malignant neoplasm of breast: Secondary | ICD-10-CM | POA: Diagnosis not present

## 2022-02-10 DIAGNOSIS — R599 Enlarged lymph nodes, unspecified: Secondary | ICD-10-CM | POA: Insufficient documentation

## 2022-02-10 DIAGNOSIS — R928 Other abnormal and inconclusive findings on diagnostic imaging of breast: Secondary | ICD-10-CM

## 2022-02-10 DIAGNOSIS — C50412 Malignant neoplasm of upper-outer quadrant of left female breast: Secondary | ICD-10-CM | POA: Diagnosis not present

## 2022-02-10 DIAGNOSIS — C50912 Malignant neoplasm of unspecified site of left female breast: Secondary | ICD-10-CM | POA: Diagnosis not present

## 2022-02-10 NOTE — Progress Notes (Unsigned)
Office Visit Note  Patient: [name]             Date of Birth: [birth year]           MRN: [id number]             PCP: Ladell Pier, MD Referring: Ladell Pier, MD Visit Date: 02/11/2022 Occupation: '@GUAROCC'$ @  Subjective:  Fatigue   History of Present Illness: [name] is a 61 y.o. female with history of seropositive rheumatoid arthritis.  She is currently on Enbrel 50 mg sq injections once weekly, Otrexup 20 mg subcutaneous injections once weekly and folic acid 2 mg daily.  She has not missed any doses of Enbrel or Otrexup recently.  She denies any recent side effects or injection site reactions.  Patient reports that she has been experiencing some increased discomfort in her right shoulder joint especially at night.  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  She requested to have updated x-rays of her right shoulder to assess for any radiographic progression.  She is a previous patient of Dr. [name] but has not yet decided if she would like to proceed with a shoulder replacement in the future.  Patient reports she continues to experience intermittent pain and inflammation in her hands.  Her discomfort is typically exacerbated by weather changes.  Overall her rheumatoid arthritis is better controlled on the current treatment regimen compared to when she initially presented. She continues to have ongoing fatigue.  She denies any recent or recurrent infections.      Activities of Daily Living:  Patient reports morning stiffness for 1 hour.   Patient Reports nocturnal pain.  Difficulty dressing/grooming: Reports Difficulty climbing stairs: Denies Difficulty getting out of chair: Denies Difficulty using hands for taps, buttons, cutlery, and/or writing: Reports  Review of Systems  Constitutional:  Positive for fatigue.  HENT:  Positive for mouth dryness. Negative for mouth sores and nose  dryness.   Eyes:  Negative for pain, visual disturbance and dryness.  Respiratory:  Negative for cough, hemoptysis and difficulty breathing.   Cardiovascular:  Negative for chest pain, palpitations, hypertension and swelling in legs/feet.  Gastrointestinal:  Negative for blood in stool, constipation and diarrhea.  Endocrine: Negative for increased urination.  Genitourinary:  Positive for involuntary urination. Negative for painful urination.  Musculoskeletal:  Positive for joint pain, joint pain, joint swelling, myalgias, muscle weakness, morning stiffness, muscle tenderness and myalgias. Negative for gait problem.  Skin:  Negative for color change, pallor, rash, hair loss, nodules/bumps, skin tightness, ulcers and sensitivity to sunlight.  Allergic/Immunologic: Negative for susceptible to infections.  Neurological:  Negative for dizziness, numbness, headaches and weakness.  Hematological:  Negative for swollen glands.  Psychiatric/Behavioral:  Positive for depressed mood and sleep disturbance. The patient is nervous/anxious.     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   Adenomatous colon polyp 03/16/2019   Degenerative joint disease of shoulder region 06/08/2018   Rheumatoid arthritis involving both hands with positive rheumatoid factor ([place]) 06/08/2018   Rheumatoid arthritis involving both feet with positive rheumatoid factor ([place]) 06/08/2018   Contracture of  left elbow 06/08/2018   Status post total hip replacement, 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   Smoker 06/08/2018   Anxiety and depression 06/08/2018   Family history of breast cancer 03/25/2018   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   Rheumatoid arthritis involving multiple sites  ([place]) 01/15/2018   Tobacco dependence 01/15/2018   Opioid use agreement exists 01/15/2018   Positive depression screening 01/15/2018   Homeless 01/15/2018    Past Medical History:  Diagnosis Date   Anemia    during pregnancy   Anxiety    Asthma    Bronchitis age 60   Cancer ([place])    left breast ca   COPD (chronic obstructive pulmonary disease) (H[facility])    Depression    Hx of bladder infections    been over 5 years since last one    Malignant neoplasm of upper-outer quadrant of left breast in female, estrogen receptor positive ([place])    Rheumatoid arthritis ([place])    unc rheum    Tobacco abuse    Trouble in sleeping     Family History  Problem Relation Age of Onset   Breast cancer Mother 54       metastatic   Cervical cancer Mother    Lung cancer Brother        maternal half brother; d. 37s   Lung cancer Maternal Uncle        d. 76s   Lung cancer Maternal Uncle        d. 72   Cancer Maternal Grandmother        unknown type; dx 39s   Healthy Son    Breast cancer Maternal Great-grandmother        dx unknown age   Past Surgical History:  Procedure Laterality Date   BREAST LUMPECTOMY WITH RADIOACTIVE SEED AND SENTINEL LYMPH NODE BIOPSY Left 08/30/2019   Procedure: LEFT BREAST LUMPECTOMY X 2 WITH RADIOACTIVE SEED AND SENTINEL LYMPH NODE BIOPSY;  Surgeon: [name], MD;  Location: [facility];  Service: General;  Laterality: Left;  PEC BLOCK   COLONOSCOPY WITH PROPOFOL N/A 03/15/2019   Procedure: COLONOSCOPY WITH PROPOFOL;  Surgeon: Jonathon Bellows, MD;  Location: [facility] END[facility]COPY;  Service: Gastroenterology;  Laterality: N/A;   HIP ARTHROPLASTY     JOINT REPLACEMENT     MULTIPLE TOOTH EXTRACTIONS     RE-EXCISION OF BREAST LUMPECTOMY Left 10/07/2019   Procedure: RE-EXCISION OF LEFT BREAST INFERIOR MARGIN;  Surgeon: [name], MD;  Location: [facility];  Service: General;  Laterality: Left;   TOTAL HIP ARTHROPLASTY Left 02/15/2018   Procedure: TOTAL HIP ARTHROPLASTY  ANTERIOR APPROACH;  Surgeon: [name], MD;  Location: [facility];  Service: Orthopedics;  Laterality: Left;   WISDOM TOOTH EXTRACTION     Social History   Social History Narrative   Not on file   Immunization History  Administered Date(s) Administered   Influenza,inj,Quad PF,6+ Mos 12/18/2020   PFIZER(Purple Top)SARS-COV-2 Vaccination 06/27/2019, 07/19/2019   Pneumococcal Conjugate-13 07/14/2018   Tdap 03/25/2018   Zoster Recombinat (Shingrix) 07/14/2018     Objective: Vital Signs: BP 103/70 (BP Location: Right Arm, Patient Position: Sitting, Cuff Size: Normal)   Pulse (!) 108   Resp 17   Ht '5\' 5"'$  (1.651 m)   Wt 109 lb (49.4 kg)   BMI 18.14 kg/m    Physical Exam Vitals and nursing note reviewed.  Constitutional:      Appearance: She is well-developed.  HENT:     Head: Normocephalic and atraumatic.  Eyes:  Conjunctiva/sclera: Conjunctivae normal.  Cardiovascular:     Rate and Haley: Normal rate and regular Haley.     Heart sounds: Normal heart sounds.  Pulmonary:     Effort: Pulmonary effort is normal.     Breath sounds: Normal breath sounds.  Abdominal:     General: Bowel sounds are normal.     Palpations: Abdomen is soft.  Musculoskeletal:     Cervical back: Normal range of motion.  Skin:    General: Skin is warm and dry.     Capillary Refill: Capillary refill takes less than 2 seconds.  Neurological:     Mental Status: She is alert and oriented to person, place, and time.  Psychiatric:        Behavior: Behavior normal.      Musculoskeletal Exam: C-spine, thoracic spine, lumbar spine have good range of motion.  Painful limited range of motion of both shoulder joints especially the right shoulder with internal rotation.  Left elbow joint has a flexion contracture which is unchanged.  Wrist joints have good range of motion with no tenderness or synovitis.  Synovial thickening over MCP joints.  PIP and DIP thickening noted. Left hip replacement has mild  discomfort with ROM.  Right hip joint has good ROM with no groin pain. Knee joints have good ROM with no warmth or effusion.  Ankle joints have good ROM with no tenderness or joint swelling.   CDAI Exam: CDAI Score: 2  Patient Global: 5 mm; Provider Global: 5 mm Swollen: 0 ; Tender: 1  Joint Exam 02/11/2022      Right  Left  Glenohumeral   Tender        Investigation: No additional findings.  Imaging: MM DIAG BREAST TOMO BILATERAL  Result Date: 02/10/2022 CLINICAL DATA:  History of LEFT breast cancer status post lumpectomy in 2021. Also, patient had a chest CT on 06/27/2021 which identified morphologically abnormal lymph nodes within the deep LEFT axilla which were recommended for an attempted ultrasound-guided biopsy. Patient presented for the ultrasound-guided biopsy, however, the lymph nodes were not safely accessible due to the deep position in the LEFT axilla adjacent to axillary blood vessels and inability to properly position the patient for the biopsies due to LEFT shoulder arthritis. Patient has a history of rheumatoid arthritis. Given the inaccessibility of the lymph nodes at that time for ultrasound-guided biopsy, and as 2 similar-appearing lymph nodes were identified on an earlier MRI guided breast biopsy of 07/07/2019 suggesting benignity for these axillary lymph nodes, a six-month follow-up LEFT axillary ultrasound was recommended. Patient presents today for the recommended follow-up LEFT axillary ultrasound in addition to the bilateral diagnostic mammogram. EXAM: DIGITAL DIAGNOSTIC BILATERAL MAMMOGRAM WITH TOMOSYNTHESIS; [place] AXILLARY LEFT TECHNIQUE: Bilateral digital diagnostic mammography and breast tomosynthesis was performed.; Targeted ultrasound examination of the left axilla was performed. COMPARISON:  Previous exams including LEFT axillary ultrasound dated 08/02/2021 and bilateral diagnostic mammogram dated 11/21/2020. ACR Breast Density Category c: The breast tissue is  heterogeneously dense, which may obscure small masses. FINDINGS: There are stable postsurgical changes within the LEFT breast. There are no new dominant masses, suspicious calcifications or secondary signs of malignancy within either breast. Targeted ultrasound is performed, evaluating the LEFT axilla, showing stable appearance of the 2 mildly prominent lymph nodes in the LEFT axilla. These are favored to be reactive in nature, related to patient's history of chronic rheumatoid arthritis. IMPRESSION: 1. No evidence of malignancy within either breast. Stable postsurgical changes within the LEFT breast. 2. Stable probably  benign reactive lymph nodes in the LEFT axilla. Recommend additional follow-up LEFT axillary ultrasound in 6 months to ensure continued stability. RECOMMENDATION: LEFT axillary ultrasound in 6 months. I have discussed the findings and recommendations with the patient. If applicable, a reminder letter will be sent to the patient regarding the next appointment. BI-RADS CATEGORY  3: Probably benign. Electronically Signed   By: Franki Cabot M.D.   On: 02/10/2022 16:14  [place] AXILLA LEFT  Result Date: 02/10/2022 CLINICAL DATA:  History of LEFT breast cancer status post lumpectomy in 2021. Also, patient had a chest CT on 06/27/2021 which identified morphologically abnormal lymph nodes within the deep LEFT axilla which were recommended for an attempted ultrasound-guided biopsy. Patient presented for the ultrasound-guided biopsy, however, the lymph nodes were not safely accessible due to the deep position in the LEFT axilla adjacent to axillary blood vessels and inability to properly position the patient for the biopsies due to LEFT shoulder arthritis. Patient has a history of rheumatoid arthritis. Given the inaccessibility of the lymph nodes at that time for ultrasound-guided biopsy, and as 2 similar-appearing lymph nodes were identified on an earlier MRI guided breast biopsy of 07/07/2019 suggesting  benignity for these axillary lymph nodes, a six-month follow-up LEFT axillary ultrasound was recommended. Patient presents today for the recommended follow-up LEFT axillary ultrasound in addition to the bilateral diagnostic mammogram. EXAM: DIGITAL DIAGNOSTIC BILATERAL MAMMOGRAM WITH TOMOSYNTHESIS; [place] AXILLARY LEFT TECHNIQUE: Bilateral digital diagnostic mammography and breast tomosynthesis was performed.; Targeted ultrasound examination of the left axilla was performed. COMPARISON:  Previous exams including LEFT axillary ultrasound dated 08/02/2021 and bilateral diagnostic mammogram dated 11/21/2020. ACR Breast Density Category c: The breast tissue is heterogeneously dense, which may obscure small masses. FINDINGS: There are stable postsurgical changes within the LEFT breast. There are no new dominant masses, suspicious calcifications or secondary signs of malignancy within either breast. Targeted ultrasound is performed, evaluating the LEFT axilla, showing stable appearance of the 2 mildly prominent lymph nodes in the LEFT axilla. These are favored to be reactive in nature, related to patient's history of chronic rheumatoid arthritis. IMPRESSION: 1. No evidence of malignancy within either breast. Stable postsurgical changes within the LEFT breast. 2. Stable probably benign reactive lymph nodes in the LEFT axilla. Recommend additional follow-up LEFT axillary ultrasound in 6 months to ensure continued stability. RECOMMENDATION: LEFT axillary ultrasound in 6 months. I have discussed the findings and recommendations with the patient. If applicable, a reminder letter will be sent to the patient regarding the next appointment. BI-RADS CATEGORY  3: Probably benign. Electronically Signed   By: Franki Cabot M.D.   On: 02/10/2022 16:14  CT CHEST NODULE FOLLOW UP LOW DOSE W/O  Result Date: 01/22/2022 CLINICAL DATA:  Lung nodule, lung crackles, shortness of breath, history of breast cancer. * Tracking Code: BO * EXAM:  CT CHEST WITHOUT CONTRAST TECHNIQUE: Multidetector CT imaging of the chest was performed following the standard protocol without IV contrast. RADIATION DOSE REDUCTION: This exam was performed according to the departmental dose-optimization program which includes automated exposure control, adjustment of the mA and/or kV according to patient size and/or use of iterative reconstruction technique. COMPARISON:  06/27/2021. FINDINGS: Cardiovascular: Heart size normal.  No pericardial effusion. Mediastinum/Nodes: Subcentimeter low-attenuation lesion in the left thyroid. No follow-up recommended. (Ref: J Am Coll Radiol. 2015 Feb;12(2): 143-50).No pathologically enlarged mediastinal or axillary lymph nodes. Hilar regions are difficult to definitively evaluate without IV contrast. Surgical clips in the left axilla and left breast. Esophagus is grossly unremarkable.  Lungs/Pleura: Biapical pleuroparenchymal scarring. Centrilobular emphysema. 2 mm posterior right lower lobe nodule (3/117), unchanged. Focal peribronchial thickening/scarring in the left upper lobe (3/38). Calcified granulomas. No new pulmonary nodules. No pleural fluid. Debris is seen in the airway. Upper Abdomen: Low-attenuation lesions in the liver measure up to 1.5 cm and are likely cysts. Visualized portions of the liver, adrenal glands and right kidney are unremarkable. Punctate stone in the left kidney. Visualized portions of the spleen, pancreas and stomach are grossly unremarkable. Musculoskeletal: Degenerative changes in the spine and shoulders. No worrisome lytic or sclerotic lesions. IMPRESSION: 1. Lung-RADS 2, benign appearance or behavior. Continue annual screening with low-dose chest CT without contrast in 12 months. 2. No evidence of metastatic disease. 3. Emphysema (ICD10-J43.9). Electronically Signed   By: Lorin Picket M.D.   On: 01/22/2022 15:10    Recent Labs: Lab Results  Component Value Date   WBC 5.8 08/19/2021   HGB 14.9 08/19/2021    PLT 209 08/19/2021   NA 141 08/19/2021   K 4.9 08/19/2021   CL 102 08/19/2021   CO2 30 08/19/2021   GLUCOSE 92 08/19/2021   BUN 22 08/19/2021   CREATININE 0.85 08/19/2021   BILITOT 0.4 08/19/2021   ALKPHOS 66 12/14/2020   AST 14 08/19/2021   ALT 10 08/19/2021   PROT 6.3 08/19/2021   ALBUMIN 4.4 12/14/2020   CALCIUM 9.4 08/19/2021   GFRAA 101 07/30/2020   QFTBGOLDPLUS NEGATIVE 08/19/2021    Speciality Comments: No specialty comments available.  Procedures:  No procedures performed Allergies: Other    Assessment / Plan:     Visit Diagnoses: Rheumatoid arthritis involving multiple sites with positive rheumatoid factor ([place]): She has no active synovitis on examination today.  She experiences intermittent pain and stiffness in both hands as well as her right shoulder joint.  Patient requested to have updated x-rays of her right shoulder today to assess for radiographic progression.  Overall her RA has been clinically stable on the current treatment regimen: Enbrel 50 mg subcutaneous injections once weekly and Otrexup 20 mg sq injections once weekly along with folic acid 2 mg daily.  She will remain on the current treatment regimen.  She was advised to notify us if she develops increased joint pain or joint swelling.  She will follow-up in the office in 5 months or sooner if needed.  High risk medication use - Enbrel 50 mg sq injections once weekly, Otrexup 20 mg sq injections once weekly, folic acid 2 mg daily.  CBC and CMP WNL on 08/19/21. Orders for CBC and CMP released today.  Her next lab work will be due in march and every 3 months.   TB gold negative on 08/19/21.  She has not had any recent or recurrent infections. Discussed the importance of holding Enbrel and Otrexup if she develops signs or symptoms of infection and to resume once the infection is completely cleared. - Plan: CBC with Differential/Platelet, COMPLETE METABOLIC PANEL WITH GFR  Contracture of left elbow:  Unchanged.  No tenderness or inflammation noted.   Chronic right shoulder pain - She has been experiencing increased pain and stiffness in the right shoulder joint.  She has been having increased nocturnal discomfort which has started to concern her.  In the past she was evaluated by Dr. [name] but decided against proceeding with a right shoulder replacement at that time.  Patient requested to have updated x-rays of the right shoulder today to assess for any radiographic progression.  Plan: XR Shoulder Right  Other secondary osteoarthritis of both shoulders: She has been experiencing increased discomfort in her right shoulder joint especially with at night.  She feels that her right shoulder joint discomfort has been exacerbated by working at [employer] and performing repetitive activities.  She also experiences increased discomfort with weather changes.  She decided against proceeding with a right shoulder replacement in the past.  She plans on following up with Dr. [name] if her symptoms persist or worsen or if there has been radiographic progression.  Status post total hip replacement, left - 02/2018 doing well.  Mild discomfort with ROM.   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  No chemotherapy. Patient had an updated mammogram and axillary ultrasound performed yesterday--reviewed report-no evidence of malignancy within either breast.  Stable probably benign reactive lymph nodes in left axilla-planning to repeat u/s in 6 months.   Other emphysema ([place]) - HRCT obtained on June 28, 2021 was consistent with mild bilateral bronchial wall thickening consistent with COPD and and a pulmonary nodule was noted.  Solitary pulmonary nodule on lung CT - Patient was evaluated by Dr. Melvyn Novas in May 2023  Smoker  Anxiety and depression  Family history of breast cancer  History of anemia -  Patient has been experiencing increased fatigue recently.  Patient requested to have iron panel checked today. Plan: Fe+TIBC+Fer  Orders: Orders Placed This Encounter  Procedures   XR Shoulder Right   CBC with Differential/Platelet   COMPLETE METABOLIC PANEL WITH GFR   Fe+TIBC+Fer   No orders of the defined types were placed in this encounter.   Follow-Up Instructions: Return in about 5 months (around 07/13/2022) for Rheumatoid arthritis, Osteoarthritis.   Ofilia Neas, PA-C  Note - This record has been created using Dragon software.  Chart creation errors have been sought, but may not always  have been located. Such creation errors do not reflect on  the standard of medical care.

## 2022-02-11 ENCOUNTER — Ambulatory Visit (INDEPENDENT_AMBULATORY_CARE_PROVIDER_SITE_OTHER): Payer: Medicare Other

## 2022-02-11 ENCOUNTER — Telehealth: Payer: Self-pay | Admitting: Physician Assistant

## 2022-02-11 ENCOUNTER — Ambulatory Visit: Payer: Medicare Other | Attending: Physician Assistant | Admitting: Physician Assistant

## 2022-02-11 ENCOUNTER — Encounter: Payer: Self-pay | Admitting: Physician Assistant

## 2022-02-11 VITALS — BP 103/70 | HR 108 | Resp 17 | Ht 65.0 in | Wt 109.0 lb

## 2022-02-11 DIAGNOSIS — Z96642 Presence of left artificial hip joint: Secondary | ICD-10-CM | POA: Diagnosis not present

## 2022-02-11 DIAGNOSIS — M24522 Contracture, left elbow: Secondary | ICD-10-CM | POA: Diagnosis not present

## 2022-02-11 DIAGNOSIS — M25511 Pain in right shoulder: Secondary | ICD-10-CM

## 2022-02-11 DIAGNOSIS — D0512 Intraductal carcinoma in situ of left breast: Secondary | ICD-10-CM | POA: Diagnosis not present

## 2022-02-11 DIAGNOSIS — Z803 Family history of malignant neoplasm of breast: Secondary | ICD-10-CM

## 2022-02-11 DIAGNOSIS — M19211 Secondary osteoarthritis, right shoulder: Secondary | ICD-10-CM | POA: Diagnosis not present

## 2022-02-11 DIAGNOSIS — G8929 Other chronic pain: Secondary | ICD-10-CM

## 2022-02-11 DIAGNOSIS — J438 Other emphysema: Secondary | ICD-10-CM | POA: Diagnosis not present

## 2022-02-11 DIAGNOSIS — F419 Anxiety disorder, unspecified: Secondary | ICD-10-CM

## 2022-02-11 DIAGNOSIS — Z79899 Other long term (current) drug therapy: Secondary | ICD-10-CM

## 2022-02-11 DIAGNOSIS — F17200 Nicotine dependence, unspecified, uncomplicated: Secondary | ICD-10-CM

## 2022-02-11 DIAGNOSIS — M0579 Rheumatoid arthritis with rheumatoid factor of multiple sites without organ or systems involvement: Secondary | ICD-10-CM

## 2022-02-11 DIAGNOSIS — R911 Solitary pulmonary nodule: Secondary | ICD-10-CM | POA: Diagnosis not present

## 2022-02-11 DIAGNOSIS — Z862 Personal history of diseases of the blood and blood-forming organs and certain disorders involving the immune mechanism: Secondary | ICD-10-CM | POA: Diagnosis not present

## 2022-02-11 DIAGNOSIS — M19212 Secondary osteoarthritis, left shoulder: Secondary | ICD-10-CM

## 2022-02-11 DIAGNOSIS — F32A Depression, unspecified: Secondary | ICD-10-CM

## 2022-02-11 NOTE — Telephone Encounter (Signed)
I attempted to call the patient to review x-ray results.  There is a possible fracture and chronic erosive changes.  Dr. Estanislado Pandy recommends seeing Dr. [name] this week for further evaluation.

## 2022-02-11 NOTE — Patient Instructions (Signed)
Standing Labs We placed an order today for your standing lab work.   Please have your standing labs drawn in March and every 3 months   Please have your labs drawn 2 weeks prior to your appointment so that the provider can discuss your lab results at your appointment.  Please note that you may see your imaging and lab results in MyChart before we have reviewed them. We will contact you once all results are reviewed. Please allow our office up to 72 hours to thoroughly review all of the results before contacting the office for clarification of your results.  Lab hours are:   Monday through Thursday from 8:00 am -12:30 pm and 1:00 pm-5:00 pm and Friday from 8:00 am-12:00 pm.  Please be advised, all patients with office appointments requiring lab work will take precedent over walk-in lab work.   Labs are drawn by [facility]. Please bring your co-pay at the time of your lab draw.  You may receive a bill from [facility] for your lab work.  Please note if you are on Hydroxychloroquine and and an order has been placed for a Hydroxychloroquine level, you will need to have it drawn 4 hours or more after your last dose.  If you wish to have your labs drawn at another location, please call the office 24 hours in advance so we can fax the orders.  The office is located at [address], [address], [place], [place] [address] No appointment is necessary.    If you have any questions regarding directions or hours of operation,  please call [phone].   As a reminder, please drink plenty of water prior to coming for your lab work. Thanks!  If you have signs or symptoms of an infection or start antibiotics: First, call your PCP for workup of your infection. Hold your medication through the infection, until you complete your antibiotics, and until symptoms resolve if you take the following: Injectable medication (Actemra, Benlysta, Cimzia, Cosentyx, Enbrel, Humira, Kevzara, Orencia, Remicade, Simponi,  Stelara, Taltz, Tremfya) Methotrexate Leflunomide (Arava) Mycophenolate (Cellcept) Xeljanz, Olumiant, or Rinvoq   Vaccines You are taking a medication(s) that can suppress your immune system.  The following immunizations are recommended: Flu annually Covid-19  Td/Tdap (tetanus, diphtheria, pertussis) every 10 years Pneumonia (Prevnar 15 then Pneumovax 23 at least 1 year apart.  Alternatively, can take Prevnar 20 without needing additional dose) Shingrix: 2 doses from 4 weeks to 6 months apart  Please check with your PCP to make sure you are up to date.   

## 2022-02-12 ENCOUNTER — Other Ambulatory Visit: Payer: Self-pay | Admitting: *Deleted

## 2022-02-12 ENCOUNTER — Telehealth: Payer: Self-pay | Admitting: Internal Medicine

## 2022-02-12 ENCOUNTER — Other Ambulatory Visit: Payer: Self-pay | Admitting: Internal Medicine

## 2022-02-12 DIAGNOSIS — Z79899 Other long term (current) drug therapy: Secondary | ICD-10-CM

## 2022-02-12 DIAGNOSIS — M0579 Rheumatoid arthritis with rheumatoid factor of multiple sites without organ or systems involvement: Secondary | ICD-10-CM

## 2022-02-12 LAB — CBC WITH DIFFERENTIAL/PLATELET
Absolute Monocytes: 307 cells/uL (ref 200–950)
Basophils Absolute: 9 cells/uL (ref 0–200)
Basophils Relative: 0.3 %
Eosinophils Absolute: 99 cells/uL (ref 15–500)
Eosinophils Relative: 3.2 %
HCT: 42.5 % (ref 35.0–45.0)
Hemoglobin: 14.6 g/dL (ref 11.7–15.5)
Lymphs Abs: 1308 cells/uL (ref 850–3900)
MCH: 30.5 pg (ref 27.0–33.0)
MCHC: 34.4 g/dL (ref 32.0–36.0)
MCV: 88.9 fL (ref 80.0–100.0)
MPV: 10.2 fL (ref 7.5–12.5)
Monocytes Relative: 9.9 %
Neutro Abs: 1376 cells/uL — ABNORMAL LOW (ref 1500–7800)
Neutrophils Relative %: 44.4 %
Platelets: 144 10*3/uL (ref 140–400)
RBC: 4.78 10*6/uL (ref 3.80–5.10)
RDW: 13.2 % (ref 11.0–15.0)
Total Lymphocyte: 42.2 %
WBC: 3.1 10*3/uL — ABNORMAL LOW (ref 3.8–10.8)

## 2022-02-12 LAB — COMPLETE METABOLIC PANEL WITH GFR
AG Ratio: 1.7 (calc) (ref 1.0–2.5)
ALT: 13 U/L (ref 6–29)
AST: 18 U/L (ref 10–35)
Albumin: 4.2 g/dL (ref 3.6–5.1)
Alkaline phosphatase (APISO): 59 U/L (ref 37–153)
BUN: 12 mg/dL (ref 7–25)
CO2: 31 mmol/L (ref 20–32)
Calcium: 9.3 mg/dL (ref 8.6–10.4)
Chloride: 104 mmol/L (ref 98–110)
Creat: 0.88 mg/dL (ref 0.50–1.05)
Globulin: 2.5 g/dL (calc) (ref 1.9–3.7)
Glucose, Bld: 80 mg/dL (ref 65–99)
Potassium: 4.8 mmol/L (ref 3.5–5.3)
Sodium: 145 mmol/L (ref 135–146)
Total Bilirubin: 0.4 mg/dL (ref 0.2–1.2)
Total Protein: 6.7 g/dL (ref 6.1–8.1)
eGFR: 74 mL/min/{1.73_m2} (ref >=60)

## 2022-02-12 LAB — IRON,TIBC AND FERRITIN PANEL
%SAT: 25 % (calc) (ref 16–45)
Ferritin: 89 ng/mL (ref 16–288)
Iron: 73 ug/dL (ref 45–160)
TIBC: 287 mcg/dL (calc) (ref 250–450)

## 2022-02-12 NOTE — Telephone Encounter (Signed)
Called and spoke with [name]. Patient stated that the breztri isn't working too well for her. Patient stated that she has been having to use her albuterol inhaler a lot more lately. Patient wants to know if there is anything else she can try and get called in to the pharmacy.   MW, please advise.

## 2022-02-12 NOTE — Telephone Encounter (Signed)
Prednisone 10 mg take  4 each am x 2 days,   2 each am x 2 days,  1 each am x 2 days and then resume previous dose she was on and if on zero then leave at 10 mg daily until next ov   Needs ov next available with me to regroup - should bring all active meds then

## 2022-02-12 NOTE — Telephone Encounter (Signed)
Attempted to call pt but unable to reach. Left message to return call.  

## 2022-02-12 NOTE — Progress Notes (Signed)
White blood cell count is low at 3.1.  Absolute neutrophils are low.  Recommend rechecking CBC with differential in 1 month. CMP WNL.  Iron panel WNL.   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  No fracture or acute abnormality was noted.  Patient should follow up with Dr. [name] if she continues to have discomfort.

## 2022-02-13 ENCOUNTER — Telehealth: Payer: Self-pay | Admitting: Internal Medicine

## 2022-02-13 MED ORDER — PREDNISONE 10 MG PO TABS: ORAL_TABLET | ORAL | 0 refills | Status: DC

## 2022-02-13 NOTE — Telephone Encounter (Signed)
Patient is returning phone call. Patient phone number is [phone]. May leave detailed message on voicemail.

## 2022-02-13 NOTE — Telephone Encounter (Signed)
Called and spoke with the pt and notified of response per MW  She verbalized understanding  She states she is not on maint dose of pred  I sent taper to pharm  She was unable to schedule appt at this time bc she is walking into work  She will call back  Nothing further needed

## 2022-02-13 NOTE — Telephone Encounter (Signed)
Duplicate msg.

## 2022-02-20 ENCOUNTER — Ambulatory Visit: Payer: Medicare Other | Admitting: Physician Assistant

## 2022-02-21 ENCOUNTER — Ambulatory Visit: Payer: Medicare Other | Admitting: Internal Medicine

## 2022-02-21 NOTE — Progress Notes (Deleted)
[name], female    DOB: Mar 29, 1960   MRN: [id number]   Brief patient profile:  61  yowf  with RA Quit smoking around 03/2021 referred to pulmonary clinic in [facility]  07/31/2021 by Karle Plumber MD for  SPN in setting of recurrent bronchitis in her 61s and around 2018 more daily symptoms > started symbicort 1st around 20022 and then incruse 06/2021    History of Present Illness  07/31/2021  Pulmonary/ 1st office eval/ Donelda Mailhot / [place] [facility]  RA on Prednisone 5 mg  2-3/ weeks  Chief Complaint  Patient presents with   Consult    Had Lung Scan, sob, cough-clear, Incruse has helped  Dyspnea:  does water aerobics, trouble with steps, does ok flat nl pace  Cough: still having coughing fits  Sleep: bed is flat / sleeps on side  SABA use: once or twice a daily seem to help  02 none  Covid 19 x 2 vax  Feels like mtx/embrel helping arthritic symptoms Rec Plan A = Automatic = Always=    Incruse one click each am then rinse and gargle  Plan B = Backup (to supplement plan A, not to replace it) Only use your albuterol inhaler as a rescue medication Please remember to go to the lab department    allergy  Screen Eos 0.2 / IgE 85    alpha one AT phenotype  MM  Level 186         09/18/2021  f/u ov/Tanayah Squitieri re: GOLD 3 copd  maint on bevespi 2 bid  / f/u spn  Chief Complaint  Patient presents with   Follow-up    Discuss PFT-sob-occass. cough-clear,Urgent Care 4-5 days ago with pneumonia  Dyspnea:  MMRC2 = can't walk a nl pace on a flat grade s sob but does fine slow and flat and does water aerobics too  Cough: cough better p rx for "pna" with abx/ steroids but cxr nl so likely just a viral ur/aecopd  Sleeping: flat bed/ on side one pillow fine  SABA use: up to twice daily  02: none  Rec Plan A = Automatic = Always=    Bevespi (or Breztri) Take 2 puffs first thing in am and then another 2 puffs about 12 hours later.  Work on inhaler technique:  Plan B = Backup (to supplement plan A, not to  replace it) Only use your albuterol inhaler as a rescue medication If having more frequent flares I recommend you change to Breztri Take 2 puffs first thing in am and then another 2 puffs about 12 hours later.  Add : if using pred daily for RA really no difference between bevespi/ breztri    02/21/2022  f/u ov/Idalis Hoelting/ [facility] re: ***   maint on ***  No chief complaint on file.   Dyspnea:  *** Cough: *** Sleeping: *** SABA use: *** 02: *** Covid status:   ***   No obvious day to day or daytime variability or assoc excess/ purulent sputum or mucus plugs or hemoptysis or cp or chest tightness, subjective wheeze or overt sinus or hb symptoms.   *** without nocturnal  or early am exacerbation  of respiratory  c/o's or need for noct saba. Also denies any obvious fluctuation of symptoms with weather or environmental changes or other aggravating or alleviating factors except as outlined above   No unusual exposure hx or h/o childhood pna/ asthma or knowledge of premature birth.  Current Allergies, Complete Past Medical History, Past Surgical  History, Family History, and Social History were reviewed in [employer] record.  ROS  The following are not active complaints unless bolded Hoarseness, sore throat, dysphagia, dental problems, itching, sneezing,  nasal congestion or discharge of excess mucus or purulent secretions, ear ache,   fever, chills, sweats, unintended wt loss or wt gain, classically pleuritic or exertional cp,  orthopnea pnd or arm/hand swelling  or leg swelling, presyncope, palpitations, abdominal pain, anorexia, nausea, vomiting, diarrhea  or change in bowel habits or change in bladder habits, change in stools or change in urine, dysuria, hematuria,  rash, arthralgias, visual complaints, headache, numbness, weakness or ataxia or problems with walking or coordination,  change in mood or  memory.        No outpatient medications have been marked as  taking for the 02/21/22 encounter (Appointment) with Tanda Rockers, MD.                            Past Medical History:  Diagnosis Date   Anemia    during pregnancy   Anxiety    Asthma    Bronchitis age 61   Cancer ([place])    left breast ca   COPD (chronic obstructive pulmonary disease) ([place])    Depression    Hx of bladder infections    been over 5 years since last one    Malignant neoplasm of upper-outer quadrant of left breast in female, estrogen receptor positive ([place])    Rheumatoid arthritis ([place])    unc rheum    Tobacco abuse    Trouble in sleeping         Objective:       02/21/2022       ***   09/18/21 108 lb 9.6 oz (49.3 kg)  08/28/21 103 lb 9.6 oz (47 kg)  08/23/21 104 lb 9.6 oz (47.4 kg)    Vital signs reviewed  02/21/2022  - Note at rest 02 sats  ***% on ***   General appearance:    ***      Mod bar***               Assessment

## 2022-02-24 ENCOUNTER — Other Ambulatory Visit: Payer: Self-pay | Admitting: Physician Assistant

## 2022-02-24 ENCOUNTER — Other Ambulatory Visit: Payer: Self-pay | Admitting: Rheumatology

## 2022-02-24 ENCOUNTER — Other Ambulatory Visit (HOSPITAL_COMMUNITY): Payer: Self-pay

## 2022-02-24 ENCOUNTER — Other Ambulatory Visit: Payer: Self-pay

## 2022-02-24 DIAGNOSIS — M0579 Rheumatoid arthritis with rheumatoid factor of multiple sites without organ or systems involvement: Secondary | ICD-10-CM

## 2022-02-24 MED ORDER — OTREXUP 20 MG/0.4ML ~~LOC~~ SOAJ
20.0000 mg | SUBCUTANEOUS | 2 refills | Status: DC
  Filled 2022-02-24: qty 1.6, 28d supply, fill #0
  Filled 2022-03-21: qty 1.6, 28d supply, fill #1
  Filled 2022-05-06: qty 1.6, 28d supply, fill #2

## 2022-02-24 MED ORDER — FOLIC ACID 1 MG PO TABS
2.0000 mg | ORAL_TABLET | Freq: Every day | ORAL | 3 refills | Status: DC
  Filled 2022-02-24 – 2022-03-11 (×3): qty 180, 90d supply, fill #0
  Filled 2022-07-07: qty 180, 90d supply, fill #1
  Filled 2023-01-12: qty 180, 90d supply, fill #2

## 2022-02-24 NOTE — Telephone Encounter (Signed)
Next Visit: 07/15/2022  Last Visit: 02/11/2022  Dx: Rheumatoid arthritis involving multiple sites with positive rheumatoid factor   Current Dose per office note on 15/0/5697: folic acid 2 mg daily   Okay to refill Folic Acid?

## 2022-02-24 NOTE — Telephone Encounter (Signed)
Next Visit: 07/15/2022  Last Visit: 02/11/2022  Last Fill: 10/10/2021  DX: Rheumatoid arthritis involving multiple sites with positive rheumatoid factor   Current Dose per office note 02/11/2022: Otrexup 20 mg sq injections once weekly   Labs: 02/11/2022 White blood cell count is low at 3.1.  Absolute neutrophils are low. CMP WNL.   Okay to refill Otrexup?

## 2022-02-25 ENCOUNTER — Other Ambulatory Visit: Payer: Self-pay

## 2022-02-25 ENCOUNTER — Other Ambulatory Visit (HOSPITAL_COMMUNITY): Payer: Self-pay

## 2022-02-25 ENCOUNTER — Encounter (HOSPITAL_COMMUNITY): Payer: Self-pay

## 2022-02-26 ENCOUNTER — Other Ambulatory Visit: Payer: Self-pay

## 2022-02-26 ENCOUNTER — Other Ambulatory Visit (HOSPITAL_COMMUNITY): Payer: Self-pay

## 2022-02-27 ENCOUNTER — Other Ambulatory Visit (HOSPITAL_COMMUNITY): Payer: Self-pay

## 2022-02-28 ENCOUNTER — Ambulatory Visit (INDEPENDENT_AMBULATORY_CARE_PROVIDER_SITE_OTHER): Payer: Medicare Other | Admitting: Internal Medicine

## 2022-02-28 ENCOUNTER — Encounter: Payer: Self-pay | Admitting: Internal Medicine

## 2022-02-28 ENCOUNTER — Other Ambulatory Visit: Payer: Self-pay

## 2022-02-28 ENCOUNTER — Other Ambulatory Visit (HOSPITAL_COMMUNITY): Payer: Self-pay

## 2022-02-28 VITALS — BP 124/60 | HR 76 | Temp 97.8°F | Ht 65.0 in | Wt 111.2 lb

## 2022-02-28 DIAGNOSIS — J449 Chronic obstructive pulmonary disease, unspecified: Secondary | ICD-10-CM | POA: Diagnosis not present

## 2022-02-28 MED ORDER — BREZTRI AEROSPHERE 160-9-4.8 MCG/ACT IN AERO
2.0000 | INHALATION_SPRAY | Freq: Two times a day (BID) | RESPIRATORY_TRACT | 11 refills | Status: DC
  Filled 2022-02-28: qty 10.7, 30d supply, fill #0
  Filled 2022-03-21: qty 10.7, 30d supply, fill #1
  Filled 2022-05-06: qty 10.7, 30d supply, fill #2
  Filled 2022-06-17: qty 10.7, 30d supply, fill #3
  Filled 2022-07-10: qty 10.7, 30d supply, fill #4
  Filled 2022-08-06: qty 10.7, 30d supply, fill #5
  Filled 2022-09-15: qty 10.7, 30d supply, fill #6
  Filled 2022-10-08: qty 10.7, 30d supply, fill #7
  Filled 2022-11-12: qty 10.7, 30d supply, fill #8
  Filled 2022-12-03 – 2022-12-05 (×2): qty 10.7, 30d supply, fill #9
  Filled 2022-12-25: qty 10.7, 30d supply, fill #10
  Filled 2023-02-11: qty 10.7, 30d supply, fill #11

## 2022-02-28 MED ORDER — BREZTRI AEROSPHERE 160-9-4.8 MCG/ACT IN AERO: 2.0000 | INHALATION_SPRAY | Freq: Two times a day (BID) | RESPIRATORY_TRACT | 0 refills | Status: DC

## 2022-02-28 NOTE — Progress Notes (Signed)
[name], female    DOB: 27-Oct-1960   MRN: [id number]   Brief patient profile:  61  yowf  with RA Quit smoking around 03/2021 referred to pulmonary clinic in [facility]  07/31/2021 by Karle Plumber MD for  SPN in setting of recurrent bronchitis in her 74s and around 2018 more daily symptoms > started symbicort 1st around 20022 and then incruse 06/2021    History of Present Illness  07/31/2021  Pulmonary/ 1st office eval/ Brynna Dobos / [place] [facility]  RA on Prednisone 5 mg  2-3/ weeks  Chief Complaint  Patient presents with   Consult    Had Lung Scan, sob, cough-clear, Incruse has helped  Dyspnea:  does water aerobics, trouble with steps, does ok flat nl pace  Cough: still having coughing fits  Sleep: bed is flat / sleeps on side  SABA use: once or twice a daily seem to help  02 none  Covid 19 x 2 vax  Feels like mtx/embrel helping arthritic symptoms Rec Plan A = Automatic = Always=    Incruse one click each am then rinse and gargle  Plan B = Backup (to supplement plan A, not to replace it) Only use your albuterol inhaler as a rescue medication Please remember to go to the lab department    allergy  Screen Eos 0.2 / IgE 85    alpha one AT phenotype  MM  Level 186         09/18/2021  f/u ov/Emberlyn Burlison re: GOLD 3 copd  maint on bevespi 2 bid  / f/u spn  Chief Complaint  Patient presents with   Follow-up    Discuss PFT-sob-occass. cough-clear,Urgent Care 4-5 days ago with pneumonia  Dyspnea:  MMRC2 = can't walk a nl pace on a flat grade s sob but does fine slow and flat and does water aerobics too  Cough: cough better p rx for "pna" with abx/ steroids but cxr nl so likely just a viral ur/aecopd  Sleeping: flat bed/ on side one pillow fine  SABA use: up to twice daily  02: none  Rec Plan A = Automatic = Always=    Bevespi (or Breztri) Take 2 puffs first thing in am and then another 2 puffs about 12 hours later.  Work on inhaler technique:  Plan B = Backup (to supplement plan A, not to  replace it) Only use your albuterol inhaler as a rescue medication If having more frequent flares I recommend you change to Breztri Take 2 puffs first thing in am and then another 2 puffs about 12 hours later.  Add : if using pred daily for RA really no difference between bevespi/ breztri    02/28/2022  f/u ov/Sondi Desch/ [facility] re: GOLD 3 copd maint on bevespi  and no pred as rheum wants to keep her off if possible   Chief Complaint  Patient presents with   Follow-up    Cold like sx 2wk ago- prescribed prednisone. Sx have improved. SOB with exertion and dry cough  Dyspnea:  slow and flat fine/ water aerobics fine  Cough: better  Sleeping: flat bed/ one pillow on side  SABA use: much less  02: none  Covid status: vax x 2    No obvious patterns in day to day or daytime variability or assoc excess/ purulent sputum or mucus plugs or hemoptysis or cp or chest tightness, subjective wheeze or overt sinus or hb symptoms.   Sleeping as above without nocturnal  or early am  exacerbation  of respiratory  c/o's or need for noct saba. Also denies any obvious fluctuation of symptoms with weather or environmental changes or other aggravating or alleviating factors except as outlined above   No unusual exposure hx or h/o childhood pna/ asthma or knowledge of premature birth.  Current Allergies, Complete Past Medical History, Past Surgical History, Family History, and Social History were reviewed in [employer] record.  ROS  The following are not active complaints unless bolded Hoarseness, sore throat, dysphagia, dental problems, itching, sneezing,  nasal congestion or discharge of excess mucus or purulent secretions, ear ache,   fever, chills, sweats, unintended wt loss or wt gain, classically pleuritic or exertional cp,  orthopnea pnd or arm/hand swelling  or leg swelling, presyncope, palpitations, abdominal pain, anorexia, nausea, vomiting, diarrhea  or change in bowel  habits or change in bladder habits, change in stools or change in urine, dysuria, hematuria,  rash, arthralgias, visual complaints, headache, numbness, weakness or ataxia or problems with walking or coordination,  change in mood or  memory.        Current Meds  Medication Sig   albuterol (PROAIR HFA) 108 (90 Base) MCG/ACT inhaler Inhale 2 puffs into the lungs every 6 (six) hours as needed for wheezing or shortness of breath.   Calcium Carb-Cholecalciferol (CALCIUM 1000 + D PO) Take by mouth.   cyanocobalamin 100 MCG tablet Take 100 mcg by mouth daily.   diazepam (VALIUM) 2 MG tablet One pill 45-60 mins prior to procedure; 1 pill 15 mins prior if needed.   Etanercept (ENBREL MINI) 50 MG/ML SOCT INJECT 50 MG INTO THE SKIN ONCE A WEEK.   folic acid (FOLVITE) 1 MG tablet Take 2 tablets (2 mg total) by mouth daily.   Glycopyrrolate-Formoterol (BEVESPI AEROSPHERE) 9-4.8 MCG/ACT AERO INHALE 2 PUFFS INTO THE LUNGS TWICE A DAY   methotrexate (RHEUMATREX) 2.5 MG tablet Take 8 tablets (20 mg total) by mouth once a week. Caution:Chemotherapy. Protect from light.   Methotrexate, PF, (OTREXUP) 20 MG/0.4ML SOAJ Inject 20 mg into the skin once a week.   Multiple Vitamin (MULTIVITAMIN WITH MINERALS) TABS tablet Take 1 tablet by mouth daily.   nicotine (NICODERM CQ - DOSED IN MG/24 HOURS) 14 mg/24hr patch PLACE 1 PATCH ONTO THE SKIN DAILY.   predniSONE (DELTASONE) 5 MG tablet Take 4 tabs po x 4 days, 3  tabs po x 4 days, 2  tabs po x 4 days, 1  tab po x 4 days   tamoxifen (NOLVADEX) 20 MG tablet Take 1 tablet (20 mg total) by mouth daily.   [DISCONTINUED] Budeson-Glycopyrrol-Formoterol (BREZTRI AEROSPHERE) 160-9-4.8 MCG/ACT AERO Inhale 2 puffs into the lungs in the morning and at bedtime.   [DISCONTINUED] predniSONE (DELTASONE) 10 MG tablet 4 x 2 days, 2 x 2 days, 1 x 2 days, then stop   [DISCONTINUED] predniSONE (DELTASONE) 5 MG tablet Take 1 tablet by mouth daily with breakfast.                             Past Medical History:  Diagnosis Date   Anemia    during pregnancy   Anxiety    Asthma    Bronchitis age 42   Cancer ([place])    left breast ca   COPD (chronic obstructive pulmonary disease) ([facility])    Depression    Hx of bladder infections    been over 5 years since last one    Malignant  neoplasm of upper-outer quadrant of left breast in female, estrogen receptor positive ([place])    Rheumatoid arthritis ([place])    unc rheum    Tobacco abuse    Trouble in sleeping         Objective:       02/28/2022      111   09/18/21 108 lb 9.6 oz (49.3 kg)  08/28/21 103 lb 9.6 oz (47 kg)  08/23/21 104 lb 9.6 oz (47.4 kg)    Vital signs reviewed  02/28/2022  - Note at rest 02 sats  94% on RA   General appearance:    amb cheerful wf nad    HEENT :  wearing mask   NECK :  without JVD/Nodes/TM/ nl carotid upstrokes bilaterally   LUNGS: no acc muscle use,  Mod barrel  contour chest wall with bilateral  Distant bs s audible wheeze and  without cough on insp or exp maneuvers and mod  Hyperresonant  to  percussion bilaterally     CV:  RRR  no s3 or murmur or increase in P2, and no edema   ABD:  soft and nontender    MS:   Ext warm without deformities or   obvious joint restrictions , calf tenderness, cyanosis or clubbing  SKIN: warm and dry without lesions    NEURO:  alert, approp, nl sensorium with  no motor or cerebellar deficits apparent.                       I personally reviewed images and agree with radiology impression as follows:   Chest CT 01/21/22    s contrast 1. Lung-RADS 2, benign appearance or behavior. Continue annual screening with low-dose chest CT without contrast in 12 months. 2. No evidence of metastatic disease. 3. Emphysema (ICD10-J43.9)   Assessment

## 2022-02-28 NOTE — Patient Instructions (Signed)
Plan A = Automatic = Always=    Breztri Take 2 puffs first thing in am and then another 2 puffs about 12 hours later.     Plan B = Backup (to supplement plan A, not to replace it) Only use your albuterol inhaler as a rescue medication to be used if you can't catch your breath by resting or doing a relaxed purse lip breathing pattern.  - The less you use it, the better it will work when you need it. - Ok to use the inhaler up to 2 puffs  every 4 hours if you must but call for appointment if use goes up over your usual need - Don't leave home without it !!  (think of it like the spare tire for your car)      Please schedule a follow up visit in 6 months but call sooner if needed

## 2022-02-28 NOTE — Assessment & Plan Note (Signed)
Quit smoking around 03/2021  - HRCT 06/27/21  Mild centrolubular emphysema  -  Labs ordered 07/31/2021  :  allergy  Screen Eos 0.2 / IgE 85    alpha one AT phenotype  MM  Level 186   - PFT's  09/05/21  FEV1 0.84 (32 % ) ratio 0.48  p 11 % improvement from saba p bevespi  prior to study with DLCO  10.7 (51%)  and FV curve classically concave   - .09/18/2021    > trial of breztri 2bid due to ? aecopd 09/11/2021  -  02/28/2022  After extensive coaching inhaler device,  effectiveness =    85%>>>  continue breztri     Group D (now reclassified as E) in terms of symptom/risk and laba/lama/ICS  therefore appropriate rx at this point >>>  breztri 2 bid since no longer using systemic steroids per rheum  F/u can be q 6 m, sooner prn          Each maintenance medication was reviewed in detail including emphasizing most importantly the difference between maintenance and prns and under what circumstances the prns are to be triggered using an action plan format where appropriate.  Total time for H and P, chart review, counseling, reviewing hfa  device(s) and generating customized AVS unique to this office visit / same day charting = 25 min

## 2022-03-04 ENCOUNTER — Other Ambulatory Visit (HOSPITAL_COMMUNITY): Payer: Self-pay

## 2022-03-05 ENCOUNTER — Other Ambulatory Visit (HOSPITAL_COMMUNITY): Payer: Self-pay

## 2022-03-11 ENCOUNTER — Other Ambulatory Visit: Payer: Self-pay

## 2022-03-11 ENCOUNTER — Other Ambulatory Visit (HOSPITAL_COMMUNITY): Payer: Self-pay

## 2022-03-17 ENCOUNTER — Other Ambulatory Visit: Payer: Self-pay | Admitting: *Deleted

## 2022-03-17 ENCOUNTER — Other Ambulatory Visit (HOSPITAL_COMMUNITY): Payer: Self-pay

## 2022-03-17 ENCOUNTER — Ambulatory Visit: Payer: Medicare Other | Attending: Physician Assistant | Admitting: Physician Assistant

## 2022-03-17 ENCOUNTER — Encounter: Payer: Self-pay | Admitting: Physician Assistant

## 2022-03-17 VITALS — BP 108/71 | HR 74 | Wt 114.8 lb

## 2022-03-17 DIAGNOSIS — Z23 Encounter for immunization: Secondary | ICD-10-CM | POA: Diagnosis not present

## 2022-03-17 DIAGNOSIS — G4700 Insomnia, unspecified: Secondary | ICD-10-CM | POA: Diagnosis not present

## 2022-03-17 DIAGNOSIS — J449 Chronic obstructive pulmonary disease, unspecified: Secondary | ICD-10-CM

## 2022-03-17 DIAGNOSIS — M0579 Rheumatoid arthritis with rheumatoid factor of multiple sites without organ or systems involvement: Secondary | ICD-10-CM | POA: Diagnosis not present

## 2022-03-17 DIAGNOSIS — Z79899 Other long term (current) drug therapy: Secondary | ICD-10-CM

## 2022-03-17 LAB — CBC WITH DIFFERENTIAL/PLATELET
Absolute Monocytes: 440 cells/uL (ref 200–950)
Basophils Absolute: 31 cells/uL (ref 0–200)
Basophils Relative: 0.7 %
Eosinophils Absolute: 128 cells/uL (ref 15–500)
Eosinophils Relative: 2.9 %
HCT: 40.6 % (ref 35.0–45.0)
Hemoglobin: 13.7 g/dL (ref 11.7–15.5)
Lymphs Abs: 1694 cells/uL (ref 850–3900)
MCH: 30.3 pg (ref 27.0–33.0)
MCHC: 33.7 g/dL (ref 32.0–36.0)
MCV: 89.8 fL (ref 80.0–100.0)
MPV: 10.6 fL (ref 7.5–12.5)
Monocytes Relative: 10 %
Neutro Abs: 2108 cells/uL (ref 1500–7800)
Neutrophils Relative %: 47.9 %
Platelets: 197 10*3/uL (ref 140–400)
RBC: 4.52 10*6/uL (ref 3.80–5.10)
RDW: 13.9 % (ref 11.0–15.0)
Total Lymphocyte: 38.5 %
WBC: 4.4 10*3/uL (ref 3.8–10.8)

## 2022-03-17 MED ORDER — ZOSTER VAC RECOMB ADJUVANTED 50 MCG/0.5ML IM SUSR: 0.5000 mL | Freq: Once | INTRAMUSCULAR | 0 refills | Status: AC

## 2022-03-17 MED ORDER — TRAZODONE HCL 50 MG PO TABS
25.0000 mg | ORAL_TABLET | Freq: Every evening | ORAL | 5 refills | Status: DC | PRN
  Filled 2022-03-17: qty 30, 30d supply, fill #0

## 2022-03-17 NOTE — Patient Instructions (Signed)
Insomnia Insomnia is a sleep disorder that makes it difficult to fall asleep or stay asleep. Insomnia can cause fatigue, low energy, difficulty concentrating, mood swings, and poor performance at work or school. There are three different ways to classify insomnia: Difficulty falling asleep. Difficulty staying asleep. Waking up too early in the morning. Any type of insomnia can be long-term (chronic) or short-term (acute). Both are common. Short-term insomnia usually lasts for 3 months or less. Chronic insomnia occurs at least three times a week for longer than 3 months. What are the causes? Insomnia may be caused by another condition, situation, or substance, such as: Having certain mental health conditions, such as anxiety and depression. Using caffeine, alcohol, tobacco, or drugs. Having gastrointestinal conditions, such as gastroesophageal reflux disease (GERD). Having certain medical conditions. These include: Asthma. Alzheimer's disease. Stroke. Chronic pain. An overactive thyroid gland (hyperthyroidism). Other sleep disorders, such as restless legs syndrome and sleep apnea. Menopause. Sometimes, the cause of insomnia may not be known. What increases the risk? Risk factors for insomnia include: Gender. Females are affected more often than males. Age. Insomnia is more common as people get older. Stress and certain medical and mental health conditions. Lack of exercise. Having an irregular work schedule. This may include working night shifts and traveling between different time zones. What are the signs or symptoms? If you have insomnia, the main symptom is having trouble falling asleep or having trouble staying asleep. This may lead to other symptoms, such as: Feeling tired or having low energy. Feeling nervous about going to sleep. Not feeling rested in the morning. Having trouble concentrating. Feeling irritable, anxious, or depressed. How is this diagnosed? This condition  may be diagnosed based on: Your symptoms and medical history. Your health care provider may ask about: Your sleep habits. Any medical conditions you have. Your mental health. A physical exam. How is this treated? Treatment for insomnia depends on the cause. Treatment may focus on treating an underlying condition that is causing the insomnia. Treatment may also include: Medicines to help you sleep. Counseling or therapy. Lifestyle adjustments to help you sleep better. Follow these instructions at home: Eating and drinking  Limit or avoid alcohol, caffeinated beverages, and products that contain nicotine and tobacco, especially close to bedtime. These can disrupt your sleep. Do not eat a large meal or eat spicy foods right before bedtime. This can lead to digestive discomfort that can make it hard for you to sleep. Sleep habits  Keep a sleep diary to help you and your health care provider figure out what could be causing your insomnia. Write down: When you sleep. When you wake up during the night. How well you sleep and how rested you feel the next day. Any side effects of medicines you are taking. What you eat and drink. Make your bedroom a dark, comfortable place where it is easy to fall asleep. Put up shades or blackout curtains to block light from outside. Use a white noise machine to block noise. Keep the temperature cool. Limit screen use before bedtime. This includes: Not watching TV. Not using your smartphone, tablet, or computer. Stick to a routine that includes going to bed and waking up at the same times every day and night. This can help you fall asleep faster. Consider making a quiet activity, such as reading, part of your nighttime routine. Try to avoid taking naps during the day so that you sleep better at night. Get out of bed if you are still awake after   15 minutes of trying to sleep. Keep the lights down, but try reading or doing a quiet activity. When you feel  sleepy, go back to bed. General instructions Take over-the-counter and prescription medicines only as told by your health care provider. Exercise regularly as told by your health care provider. However, avoid exercising in the hours right before bedtime. Use relaxation techniques to manage stress. Ask your health care provider to suggest some techniques that may work well for you. These may include: Breathing exercises. Routines to release muscle tension. Visualizing peaceful scenes. Make sure that you drive carefully. Do not drive if you feel very sleepy. Keep all follow-up visits. This is important. Contact a health care provider if: You are tired throughout the day. You have trouble in your daily routine due to sleepiness. You continue to have sleep problems, or your sleep problems get worse. Get help right away if: You have thoughts about hurting yourself or someone else. Get help right away if you feel like you may hurt yourself or others, or have thoughts about taking your own life. Go to your nearest emergency room or: Call 911. Call the National Suicide Prevention Lifeline at 1-800-273-8255 or 988. This is open 24 hours a day. Text the Crisis Text Line at 741741. Summary Insomnia is a sleep disorder that makes it difficult to fall asleep or stay asleep. Insomnia can be long-term (chronic) or short-term (acute). Treatment for insomnia depends on the cause. Treatment may focus on treating an underlying condition that is causing the insomnia. Keep a sleep diary to help you and your health care provider figure out what could be causing your insomnia. This information is not intended to replace advice given to you by your health care provider. Make sure you discuss any questions you have with your health care provider. Document Revised: 02/04/2021 Document Reviewed: 02/04/2021 Elsevier Patient Education  2023 [employer]  

## 2022-03-17 NOTE — Progress Notes (Signed)
Patient ID: [name], female   DOB: 27-Apr-1960, 62 y.o.   MRN: [id number]   [name], is a 62 y.o. female  DPO:[id number]  ERX:[id number]  DOB - [birth year]  Chief Complaint  Patient presents with   Insomnia       Subjective:   [name] is a 62 y.o. female here today for insomnia.  She usually can take melatonin and fall asleep.  But, sometimes it is not strong enough.  She has trouble falling and staying asleep.    She is requesting a handicapped placard for when her COPD is acting up.   No problems updated.  ALLERGIES: Allergies  Allergen Reactions   Other Rash    Unknown cleaner used in OR    PAST MEDICAL HISTORY: Past Medical History:  Diagnosis Date   Anemia    during pregnancy   Anxiety    Asthma    Bronchitis age 3   Cancer ([place])    left breast ca   COPD (chronic obstructive pulmonary disease) (HCC)    Depression    Hx of bladder infections    been over 5 years since last one    Malignant neoplasm of upper-outer quadrant of left breast in female, estrogen receptor positive ([place])    Rheumatoid arthritis ([place])    unc rheum    Tobacco abuse    Trouble in sleeping     MEDICATIONS AT HOME: Prior to Admission medications   Medication Sig Start Date End Date Taking? Authorizing Provider  albuterol (PROAIR HFA) 108 (90 Base) MCG/ACT inhaler Inhale 2 puffs into the lungs every 6 (six) hours as needed for wheezing or shortness of breath. 10/16/21  Yes Ladell Pier, MD  Budeson-Glycopyrrol-Formoterol (BREZTRI AEROSPHERE) 160-9-4.8 MCG/ACT AERO Inhale 2 puffs into the lungs 2 (two) times daily. 02/28/22  Yes Tanda Rockers, MD  Budeson-Glycopyrrol-Formoterol (BREZTRI AEROSPHERE) 160-9-4.8 MCG/ACT AERO Inhale 2 puffs into the lungs in the morning and at bedtime. 02/28/22  Yes Tanda Rockers, MD  Calcium Carb-Cholecalciferol (CALCIUM 1000 + D PO) Take by mouth.   Yes [provider]  cyanocobalamin 100 MCG tablet Take 100 mcg by mouth daily.   Yes  [provider]  Etanercept (ENBREL MINI) 50 MG/ML SOCT INJECT 50 MG INTO THE SKIN ONCE A WEEK. 01/02/22 01/02/23 Yes Ofilia Neas, PA-C  folic acid (FOLVITE) 1 MG tablet Take 2 tablets (2 mg total) by mouth daily. 02/24/22  Yes Ofilia Neas, PA-C  methotrexate (RHEUMATREX) 2.5 MG tablet Take 8 tablets (20 mg total) by mouth once a week. Caution:Chemotherapy. Protect from light. 10/09/21  Yes Deveshwar, Abel Presto, MD  Methotrexate, PF, (OTREXUP) 20 MG/0.4ML SOAJ Inject 20 mg into the skin once a week. 02/24/22  Yes Ofilia Neas, PA-C  Multiple Vitamin (MULTIVITAMIN WITH MINERALS) TABS tablet Take 1 tablet by mouth daily.   Yes [provider]  nicotine (NICODERM CQ - DOSED IN MG/24 HOURS) 14 mg/24hr patch PLACE 1 PATCH ONTO THE SKIN DAILY. 09/11/21  Yes Ladell Pier, MD  tamoxifen (NOLVADEX) 20 MG tablet Take 1 tablet (20 mg total) by mouth daily. 01/03/22  Yes Cammie Sickle, MD  traZODone (DESYREL) 50 MG tablet Take 0.5-1 tablets (25-50 mg total) by mouth at bedtime as needed for sleep. 03/17/22  Yes Argentina Donovan, PA-C  Zoster Vaccine Adjuvanted [facility]) injection Inject 0.5 mLs into the muscle once for 1 dose. 03/17/22 03/17/22 Yes Piccola Arico, Dionne Bucy, PA-C    ROS: Neg HEENT Neg  resp Neg cardiac Neg GI Neg GU Neg MS Neg neuro  Objective:   Vitals:   03/17/22 1401  BP: 108/71  Pulse: 74  SpO2: 95%  Weight: 114 lb 12.8 oz (52.1 kg)   Exam General appearance : Awake, alert, not in any distress. Speech Clear. Not toxic looking HEENT: Atraumatic and Normocephalic Neck: Supple, no JVD. No cervical lymphadenopathy.  Chest: Good air entry bilaterally, CTAB.  No rales/rhonchi.  There is mild wheezing CVS: S1 S2 regular, no murmurs.  Extremities: B/L Lower Ext shows no edema, both legs are warm to touch Neurology: Awake alert, and oriented X 3, CN II-XII intact, Non focal Skin: No Rash  Data Review No results found for: "HGBA1C"  Assessment & Plan    1. Insomnia, unspecified type Can try - traZODone (DESYREL) 50 MG tablet; Take 0.5-1 tablets (25-50 mg total) by mouth at bedtime as needed for sleep.  Dispense: 30 tablet; Refill: 5  2. COPD GOLD  3 Handicap placard filled out    Return in about 3 months (around 06/16/2022) for Dr Wynetta Emery for chronic conditions.  The patient was given clear instructions to go to ER or return to medical center if symptoms don't improve, worsen or new problems develop. The patient verbalized understanding. The patient was told to call to get lab results if they haven't heard anything in the next week.      Freeman Caldron, PA-C [facility] and [place], [place]   03/17/2022, 2:18 PM

## 2022-03-18 NOTE — Progress Notes (Signed)
CBC is normal.

## 2022-03-19 ENCOUNTER — Other Ambulatory Visit (HOSPITAL_COMMUNITY): Payer: Self-pay

## 2022-03-21 ENCOUNTER — Other Ambulatory Visit (HOSPITAL_COMMUNITY): Payer: Self-pay

## 2022-03-26 ENCOUNTER — Other Ambulatory Visit (HOSPITAL_COMMUNITY): Payer: Self-pay

## 2022-04-09 ENCOUNTER — Other Ambulatory Visit (HOSPITAL_COMMUNITY): Payer: Self-pay

## 2022-04-10 ENCOUNTER — Other Ambulatory Visit (HOSPITAL_COMMUNITY): Payer: Self-pay

## 2022-04-10 ENCOUNTER — Other Ambulatory Visit: Payer: Self-pay | Admitting: Physician Assistant

## 2022-04-10 MED ORDER — ENBREL MINI 50 MG/ML ~~LOC~~ SOCT
50.0000 mg | SUBCUTANEOUS | 2 refills | Status: DC
  Filled 2022-04-10: qty 4, 28d supply, fill #0
  Filled 2022-05-06: qty 4, 28d supply, fill #1
  Filled 2022-06-10: qty 4, 28d supply, fill #2

## 2022-04-10 NOTE — Telephone Encounter (Signed)
Next Visit: 07/15/2022   Last Visit: 02/11/2022   Last Fill: 01/02/2022  DX: Rheumatoid arthritis involving multiple sites with positive rheumatoid factor    Current Dose per office note 02/11/2022: Enbrel 50 mg sq injections once weekly   Labs: 02/11/2022 White blood cell count is low at 3.1.  Absolute neutrophils are low. CMP WNL.    TB Gold: 08/19/2021 Neg   Okay to refill Enbrel?

## 2022-04-11 ENCOUNTER — Other Ambulatory Visit (HOSPITAL_COMMUNITY): Payer: Self-pay

## 2022-05-02 ENCOUNTER — Other Ambulatory Visit: Payer: Self-pay | Admitting: *Deleted

## 2022-05-02 MED ORDER — PREDNISONE 5 MG PO TABS: ORAL_TABLET | ORAL | 0 refills | Status: DC

## 2022-05-02 NOTE — Telephone Encounter (Signed)
Okay to send a prednisone taper starting at 20 mg tapering by 5 mg every 4 days.  Take prednisone in the morning with food and to avoid the use of NSAIDs.

## 2022-05-02 NOTE — Telephone Encounter (Signed)
Left message to advise patient a prednisone taper has been sent to the pharmacy for her. Advised patient to take the prednisone in the morning with food and the avoid NSAIDS.

## 2022-05-02 NOTE — Telephone Encounter (Signed)
Patient states she is having a flare which started yesterday. Patient states she is having trouble with her hands and her right shoulder. Patient states she also has a knot on her right elbow which she has had before. Patient states she is having increased pain. Patient states it is interfering with her activities. Patient states she is having swelling in her hands. Patient states she has taken both her Enbrel and MTX for this week and denies missing any doses. Patient is requesting a prescription for Prednisone to be sent to [facility]. Please advise.

## 2022-05-02 NOTE — Addendum Note (Signed)
Addended by: Carole Binning on: 05/02/2022 12:59 PM   Modules accepted: Orders

## 2022-05-06 ENCOUNTER — Telehealth: Payer: Self-pay

## 2022-05-06 ENCOUNTER — Other Ambulatory Visit (HOSPITAL_COMMUNITY): Payer: Self-pay

## 2022-05-06 NOTE — Telephone Encounter (Signed)
Per Rinaldo Ratel, current Prior Authorization is expiring.  Attempted to submitt a Prior Authorization request to [facility] for OTREXUP via CoverMyMeds, however I received a response that the request was cancelled due to being previously approved from 03/10/2022-03/10/2023 under authorization [id number].  Key: Alinda Dooms updated with new info.

## 2022-05-07 ENCOUNTER — Other Ambulatory Visit (HOSPITAL_COMMUNITY): Payer: Self-pay

## 2022-05-14 ENCOUNTER — Other Ambulatory Visit (HOSPITAL_COMMUNITY): Payer: Self-pay

## 2022-06-10 ENCOUNTER — Other Ambulatory Visit (HOSPITAL_COMMUNITY): Payer: Self-pay

## 2022-06-10 ENCOUNTER — Other Ambulatory Visit: Payer: Self-pay | Admitting: Physician Assistant

## 2022-06-10 DIAGNOSIS — M0579 Rheumatoid arthritis with rheumatoid factor of multiple sites without organ or systems involvement: Secondary | ICD-10-CM

## 2022-06-11 ENCOUNTER — Other Ambulatory Visit (HOSPITAL_COMMUNITY): Payer: Self-pay

## 2022-06-11 ENCOUNTER — Other Ambulatory Visit: Payer: Self-pay

## 2022-06-11 MED ORDER — OTREXUP 20 MG/0.4ML ~~LOC~~ SOAJ
20.0000 mg | SUBCUTANEOUS | 0 refills | Status: DC
Start: 2022-06-11 — End: 2022-07-16
  Filled 2022-06-12 (×2): qty 1.6, 28d supply, fill #0

## 2022-06-11 NOTE — Telephone Encounter (Signed)
Last Fill: 02/24/2022  Labs: 02/11/2022 White blood cell count is low at 3.1.  Absolute neutrophils are low. CMP WNL. 03/17/2022 CBC is normal.   Next Visit: 07/15/2022   Last Visit: 02/11/2022   DX: Rheumatoid arthritis involving multiple sites with positive rheumatoid factor   Current Dose per office note 02/11/2022:  Otrexup 20 mg sq injections once weekly   Left message to advise patient she is due to update her labs.   Okay to refill Otrexup?

## 2022-06-12 ENCOUNTER — Other Ambulatory Visit: Payer: Self-pay

## 2022-06-17 ENCOUNTER — Ambulatory Visit: Payer: No Typology Code available for payment source | Attending: Internal Medicine | Admitting: Internal Medicine

## 2022-06-17 ENCOUNTER — Encounter: Payer: Self-pay | Admitting: Internal Medicine

## 2022-06-17 ENCOUNTER — Other Ambulatory Visit (HOSPITAL_COMMUNITY): Payer: Self-pay

## 2022-06-17 ENCOUNTER — Other Ambulatory Visit: Payer: Self-pay

## 2022-06-17 ENCOUNTER — Other Ambulatory Visit: Payer: Self-pay | Admitting: *Deleted

## 2022-06-17 VITALS — BP 104/68 | HR 96 | Temp 98.0°F | Wt 113.0 lb

## 2022-06-17 DIAGNOSIS — C50412 Malignant neoplasm of upper-outer quadrant of left female breast: Secondary | ICD-10-CM | POA: Diagnosis not present

## 2022-06-17 DIAGNOSIS — Z23 Encounter for immunization: Secondary | ICD-10-CM

## 2022-06-17 DIAGNOSIS — F17210 Nicotine dependence, cigarettes, uncomplicated: Secondary | ICD-10-CM

## 2022-06-17 DIAGNOSIS — M0579 Rheumatoid arthritis with rheumatoid factor of multiple sites without organ or systems involvement: Secondary | ICD-10-CM

## 2022-06-17 DIAGNOSIS — Z1211 Encounter for screening for malignant neoplasm of colon: Secondary | ICD-10-CM

## 2022-06-17 DIAGNOSIS — F321 Major depressive disorder, single episode, moderate: Secondary | ICD-10-CM | POA: Insufficient documentation

## 2022-06-17 DIAGNOSIS — G4700 Insomnia, unspecified: Secondary | ICD-10-CM | POA: Diagnosis not present

## 2022-06-17 DIAGNOSIS — N393 Stress incontinence (female) (male): Secondary | ICD-10-CM

## 2022-06-17 DIAGNOSIS — Z170 Estrogen receptor positive status [ER+]: Secondary | ICD-10-CM

## 2022-06-17 DIAGNOSIS — Z79899 Other long term (current) drug therapy: Secondary | ICD-10-CM | POA: Diagnosis not present

## 2022-06-17 DIAGNOSIS — F1411 Cocaine abuse, in remission: Secondary | ICD-10-CM | POA: Diagnosis not present

## 2022-06-17 MED ORDER — NICOTINE 7 MG/24HR TD PT24
7.0000 mg | MEDICATED_PATCH | Freq: Every day | TRANSDERMAL | 0 refills | Status: DC
Start: 2022-06-17 — End: 2023-03-25
  Filled 2022-06-17: qty 28, 28d supply, fill #0

## 2022-06-17 MED ORDER — HYDROXYZINE PAMOATE 25 MG PO CAPS
25.0000 mg | ORAL_CAPSULE | Freq: Every day | ORAL | 3 refills | Status: DC
Start: 2022-06-17 — End: 2023-10-27
  Filled 2022-06-17: qty 30, 30d supply, fill #0

## 2022-06-17 NOTE — Progress Notes (Signed)
Patient ID: [name], female    DOB: 1960/05/19  MRN: [id number]  CC: Insomnia (Insomnia f/u. Med refills - nicotine patches./Requesting med for sleep - trazadone did not help, caused jitters. Melatonin only helps to sleep for 2 hrs at a time. )   Subjective: [name] is a 62 y.o. female who presents for chronic ds management Her concerns today include:  Patient with history of COPD, pulmonary nodules (needs repeat CT 01/2023 ), rheumatoid arthritis, arthritis RT shoulder, tob dep, THR, anxiety/depression, stress incontinence. 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  Status postlumpectomy followed by radiation.  No chemotherapy.  On adjuvant-tamoxifen 20 mg qhs [NOV, 01/2021].    C/O ongoing issues with insomnia.   Comes and goes in bouts; more days then not. Trazodone makes her gittery. Melatonin helps but does not maintain sleep In bed by 10 and all lights/TV off by 11 p.m Drinks a cup of coffee only in a.m; occasionally wine or beer about once a wk. Feeling well mentally and staying focus. Depression is in remission; no longer on med.  She has remained free of cocaine for over a yr  Tob dep:  "I am still a cheater every now and then about 4x/yr." Would like patches to keep cravings down.  RA:  on Embrel, Folic Acid and MTX through Dr. Lorella Nimrod office.  Doing well on the meds  BMI is on the low level of normal.  However patient states that overall she eats very healthy but would not mind gaining a little weight.  Hx of LT breast CA;  on Tamoxifen Had remaining LN in axilla.  Radiologist thought it was due to inflammation from RA per pt. Will have a f/u again on this in 11/2022 with imaging and with oncologist Dr. Holly Bodily  COPD:  followed by Dr.Wert. On Breztri BID.  Usually does not have to use Albuterol but has used Albuterol  a little more past wk due to allergies  Stress incontinence is aggravating. Saw GYN in Sept and did not want surgerical  option, doing kegels exercises  HM:  due for c-scope repeat and Shingles 2.  Just got Medicare A and B Patient Active Problem List   Diagnosis Date Noted   Major depressive disorder, single episode, moderate 06/17/2022   COPD GOLD  3 07/31/2021   Solitary pulmonary nodule on lung CT 07/31/2021   Malignant neoplasm of upper-outer quadrant of left breast in female, estrogen receptor positive 09/15/2019   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   Adenomatous colon polyp 03/16/2019   Degenerative joint disease of shoulder region 06/08/2018   Rheumatoid arthritis involving both hands with positive rheumatoid factor 06/08/2018   Rheumatoid arthritis involving both feet with positive rheumatoid factor 06/08/2018   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   Status post total hip replacement, 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   Smoker 06/08/2018   Anxiety and depression 06/08/2018   Family history of breast cancer 03/25/2018   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 8.5 g 2   Budeson-Glycopyrrol-Formoterol (BREZTRI AEROSPHERE) 160-9-4.8 MCG/ACT  AERO Inhale 2 puffs into the lungs 2 (two) times daily. 10.7 g 11   Budeson-Glycopyrrol-Formoterol (BREZTRI AEROSPHERE) 160-9-4.8 MCG/ACT AERO Inhale 2 puffs into the lungs in the morning and at bedtime. 10.7 g 0   Calcium Carb-Cholecalciferol (CALCIUM 1000 + D PO) Take by mouth.     cyanocobalamin 100 MCG tablet Take 100 mcg by mouth daily.     Etanercept (ENBREL MINI) 50 MG/ML SOCT INJECT 50 MG INTO THE SKIN ONCE A WEEK. 4 mL 2    folic acid (FOLVITE) 1 MG tablet Take 2 tablets (2 mg total) by mouth daily. 180 tablet 3   methotrexate (RHEUMATREX) 2.5 MG tablet Take 8 tablets (20 mg total) by mouth once a week. Caution:Chemotherapy. Protect from light. 32 tablet 2   Multiple Vitamin (MULTIVITAMIN WITH MINERALS) TABS tablet Take 1 tablet by mouth daily.     predniSONE (DELTASONE) 5 MG tablet Take 4 tabs po x 4 days, 3  tabs po x 4 days, 2  tabs po x 4 days, 1  tab po x 4 days 40 tablet 0   tamoxifen (NOLVADEX) 20 MG tablet Take 1 tablet (20 mg total) by mouth daily. 90 tablet 1   Methotrexate, PF, (OTREXUP) 20 MG/0.4ML SOAJ Inject 20 mg into the skin once a week. (Patient not taking: Reported on 06/17/2022) 1.6 mL 0   No current facility-administered medications on file prior to visit.    Allergies  Allergen Reactions   Trazodone And Nefazodone Other (See Comments)    Makes her gittery   Other Rash    Unknown cleaner used in OR    Social History   Socioeconomic History   Marital status: Single    Spouse name: Not on file   Number of children: 1   Years of education: 14 years   Highest education level: Some college, no degree  Occupational History   Not on file  Tobacco Use   Smoking status: Former    Packs/day: 1.00    Years: 40.00    Additional pack years: 0.00    Total pack years: 40.00    Types: Cigarettes    Quit date: 03/10/2021    Years since quitting: 1.2    Passive exposure: Past   Smokeless tobacco: Never   Tobacco comments:    on nicotine patches  Vaping Use   Vaping Use: Never used  Substance and Sexual Activity   Alcohol use: Yes    Comment: Occassionally   Drug use: Not Currently    Types: Cocaine   Sexual activity: Not Currently    Birth control/protection: Post-menopausal  Other Topics Concern   Not on file  Social History Narrative   Not on file   Social Determinants of Health   Financial Resource Strain: Medium Risk (05/27/2021)   Overall Financial Resource Strain (CARDIA)     Difficulty of Paying Living Expenses: Somewhat hard  Food Insecurity: No Food Insecurity (08/09/2021)   Hunger Vital Sign    Worried About Running Out of Food in the Last Year: Never true    Ran Out of Food in the Last Year: Never true  Transportation Needs: No Transportation Needs (08/09/2021)   PRAPARE - Administrator, Civil ServiceTransportation    Lack of Transportation (Medical): No    Lack of Transportation (Non-Medical): No  Recent Concern: Transportation Needs - Unmet Transportation Needs (05/27/2021)   PRAPARE - Transportation    Lack of Transportation (Medical): Yes    Lack of Transportation (Non-Medical): Yes  Physical Activity: Insufficiently Active (08/09/2021)  Exercise Vital Sign    Days of Exercise per Week: 4 days    Minutes of Exercise per Session: 30 min  Stress: Stress Concern Present (05/27/2021)   [employer] of Occupational Health - Occupational Stress Questionnaire    Feeling of Stress : To some extent  Social Connections: Socially Isolated (05/27/2021)   Social Connection and Isolation Panel [NHANES]    Frequency of Communication with Friends and Family: Once a week    Frequency of Social Gatherings with Friends and Family: Twice a week    Attends Religious Services: Never    Database administrator or Organizations: No    Attends Banker Meetings: Never    Marital Status: Divorced  Catering manager Violence: Not At Risk (08/09/2021)   Humiliation, Afraid, Rape, and Kick questionnaire    Fear of Current or Ex-Partner: No    Emotionally Abused: No    Physically Abused: No    Sexually Abused: No    Family History  Problem Relation Age of Onset   Breast cancer Mother 84       metastatic   Cervical cancer Mother    Lung cancer Brother        maternal half brother; d. 49s   Lung cancer Maternal Uncle        d. 52s   Lung cancer Maternal Uncle        d. 70   Cancer Maternal Grandmother        unknown type; dx 41s   Healthy Son    Breast cancer Maternal  Great-grandmother        dx unknown age    Past Surgical History:  Procedure Laterality Date   BREAST LUMPECTOMY WITH RADIOACTIVE SEED AND SENTINEL LYMPH NODE BIOPSY Left 08/30/2019   Procedure: LEFT BREAST LUMPECTOMY X 2 WITH RADIOACTIVE SEED AND SENTINEL LYMPH NODE BIOPSY;  Surgeon: Griselda Miner, MD;  Location: [facility] OR;  Service: General;  Laterality: Left;  PEC BLOCK   COLONOSCOPY WITH PROPOFOL N/A 03/15/2019   Procedure: COLONOSCOPY WITH PROPOFOL;  Surgeon: Wyline Mood, MD;  Location: [facility] ENDOSCOPY;  Service: Gastroenterology;  Laterality: N/A;   HIP ARTHROPLASTY     JOINT REPLACEMENT     MULTIPLE TOOTH EXTRACTIONS     RE-EXCISION OF BREAST LUMPECTOMY Left 10/07/2019   Procedure: RE-EXCISION OF LEFT BREAST INFERIOR MARGIN;  Surgeon: Griselda Miner, MD;  Location: [facility];  Service: General;  Laterality: Left;   TOTAL HIP ARTHROPLASTY Left 02/15/2018   Procedure: TOTAL HIP ARTHROPLASTY ANTERIOR APPROACH;  Surgeon: Gean Birchwood, MD;  Location: [facility]S;  Service: Orthopedics;  Laterality: Left;   WISDOM TOOTH EXTRACTION      ROS: Review of Systems Negative except as stated above  PHYSICAL EXAM: BP 104/68 (BP Location: Left Arm, Patient Position: Sitting, Cuff Size: Normal)   Pulse 96   Temp 98 F (36.7 C) (Oral)   Wt 113 lb (51.3 kg)   SpO2 95%   BMI 18.80 kg/m   Wt Readings from Last 3 Encounters:  06/17/22 113 lb (51.3 kg)  03/17/22 114 lb 12.8 oz (52.1 kg)  02/28/22 111 lb 3.2 oz (50.4 kg)    Physical Exam  General appearance -older Caucasian female who appears thin and slightly underweight.  She is in no acute distress. Mental status - normal mood, behavior, speech, dress, motor activity, and thought processes Mouth - mucous membranes moist, pharynx normal without lesions Chest - clear to auscultation, no wheezes, rales or rhonchi,  symmetric air entry Heart - normal rate, regular rhythm, normal S1, S2, no murmurs, rubs, clicks or gallops Musculoskeletal  -enlargement of the MCP joints of the hands. Extremities - peripheral pulses normal, no pedal edema, no clubbing or cyanosis     06/17/2022    2:39 PM 03/17/2022    2:02 PM 08/09/2021   10:32 AM  Depression screen PHQ 2/9  Decreased Interest 1 1 0  Down, Depressed, Hopeless 0 0 0  PHQ - 2 Score 1 1 0  Altered sleeping 2 3   Tired, decreased energy 1 2   Change in appetite 0 1   Feeling bad or failure about yourself  0 0   Trouble concentrating 0 1   Moving slowly or fidgety/restless 1 1   Suicidal thoughts 0 0   PHQ-9 Score 5 9         Latest Ref Rng & Units 02/11/2022    2:30 PM 08/19/2021    1:54 PM 05/22/2021    3:26 PM  CMP  Glucose 65 - 99 mg/dL 80  92  99   BUN 7 - 25 mg/dL 12  22  19    Creatinine 0.50 - 1.05 mg/dL 7.89  3.81  0.17   Sodium 135 - 146 mmol/L 145  141  144   Potassium 3.5 - 5.3 mmol/L 4.8  4.9  4.8   Chloride 98 - 110 mmol/L 104  102  104   CO2 20 - 32 mmol/L 31  30  31    Calcium 8.6 - 10.4 mg/dL 9.3  9.4  9.5   Total Protein 6.1 - 8.1 g/dL 6.7  6.3  6.9   Total Bilirubin 0.2 - 1.2 mg/dL 0.4  0.4  0.4   AST 10 - 35 U/L 18  14  15    ALT 6 - 29 U/L 13  10  13     Lipid Panel     Component Value Date/Time   CHOL 176 07/30/2021 1127   TRIG 122 07/30/2021 1127   HDL 71 07/30/2021 1127   CHOLHDL 2.5 07/30/2021 1127   LDLCALC 84 07/30/2021 1127    CBC    Component Value Date/Time   WBC 4.4 03/17/2022 1443   RBC 4.52 03/17/2022 1443   HGB 13.7 03/17/2022 1443   HGB 14.5 10/24/2020 1210   HGB 12.8 01/15/2018 1509   HCT 40.6 03/17/2022 1443   HCT 38.3 01/15/2018 1509   PLT 197 03/17/2022 1443   PLT 167 10/24/2020 1210   PLT 311 01/15/2018 1509   MCV 89.8 03/17/2022 1443   MCV 86 01/15/2018 1509   MCH 30.3 03/17/2022 1443   MCHC 33.7 03/17/2022 1443   RDW 13.9 03/17/2022 1443   RDW 14.2 01/15/2018 1509   LYMPHSABS 1,694 03/17/2022 1443   MONOABS 0.5 07/31/2021 1145   EOSABS 128 03/17/2022 1443   BASOSABS 31 03/17/2022 1443    ASSESSMENT AND  PLAN: 1. Insomnia, unspecified type We discussed options of hydroxyzine versus Remeron.  Benefits of Remeron is that it would cause a little weight gain for her.  However she does not have any depression at this time.  We decided to go with hydroxyzine instead.  Good sleep hygiene discussed and encouraged. - hydrOXYzine (VISTARIL) 25 MG capsule; Take 1 capsule (25 mg total) by mouth at bedtime.  Dispense: 30 capsule; Refill: 3  2. Light smoker Commended her on trying to remain free of cigarettes.  We prescribed a nicotine patches to help decrease cravings. - nicotine (NICODERM  CQ - DOSED IN MG/24 HR) 7 mg/24hr patch; Place 1 patch (7 mg total) onto the skin daily.  Dispense: 28 patch; Refill: 0   3. Rheumatoid arthritis involving multiple sites with positive rheumatoid factor Followed by rheumatology.  Stable on medications listed above.  4. Malignant neoplasm of upper-outer quadrant of left breast in female, estrogen receptor positive Patient has completed active treatment.  Patient on tamoxifen.  5. Cocaine abuse in remission Commended her on this and encouraged her to remain free of street drugs  6. Stress incontinence Discussed referral to urogynecologist but patient wants to defer on this for now.  She will continue Kegels exercises  7. Screening for colon cancer Due for repeat colonoscopy. - Ambulatory referral to Gastroenterology  8. Need for shingles vaccine Second Shingrix vaccine given today.     Patient was given the opportunity to ask questions.  Patient verbalized understanding of the plan and was able to repeat key elements of the plan.   This documentation was completed using Paediatric nurse.  Any transcriptional errors are unintentional.  Orders Placed This Encounter  Procedures   Varicella-zoster vaccine IM   Ambulatory referral to Gastroenterology     Requested Prescriptions   Signed Prescriptions Disp Refills   nicotine (NICODERM CQ  - DOSED IN MG/24 HR) 7 mg/24hr patch 28 patch 0    Sig: Place 1 patch (7 mg total) onto the skin daily.   hydrOXYzine (VISTARIL) 25 MG capsule 30 capsule 3    Sig: Take 1 capsule (25 mg total) by mouth at bedtime.    Return in about 1 month (around 07/17/2022) for For Welcome to Medicare Visit with me.  Jonah Blue, MD, FACP

## 2022-06-18 LAB — CBC WITH DIFFERENTIAL/PLATELET
Absolute Monocytes: 243 cells/uL (ref 200–950)
Basophils Absolute: 11 cells/uL (ref 0–200)
Basophils Relative: 0.2 %
Eosinophils Absolute: 70 cells/uL (ref 15–500)
Eosinophils Relative: 1.3 %
HCT: 40.8 % (ref 35.0–45.0)
Hemoglobin: 13.9 g/dL (ref 11.7–15.5)
Lymphs Abs: 1652 cells/uL (ref 850–3900)
MCH: 31.1 pg (ref 27.0–33.0)
MCHC: 34.1 g/dL (ref 32.0–36.0)
MCV: 91.3 fL (ref 80.0–100.0)
MPV: 10.7 fL (ref 7.5–12.5)
Monocytes Relative: 4.5 %
Neutro Abs: 3424 cells/uL (ref 1500–7800)
Neutrophils Relative %: 63.4 %
Platelets: 196 10*3/uL (ref 140–400)
RBC: 4.47 10*6/uL (ref 3.80–5.10)
RDW: 13.3 % (ref 11.0–15.0)
Total Lymphocyte: 30.6 %
WBC: 5.4 10*3/uL (ref 3.8–10.8)

## 2022-06-18 LAB — COMPLETE METABOLIC PANEL WITH GFR
AG Ratio: 1.7 (calc) (ref 1.0–2.5)
ALT: 15 U/L (ref 6–29)
AST: 18 U/L (ref 10–35)
Albumin: 4 g/dL (ref 3.6–5.1)
Alkaline phosphatase (APISO): 58 U/L (ref 37–153)
BUN: 19 mg/dL (ref 7–25)
CO2: 28 mmol/L (ref 20–32)
Calcium: 9.5 mg/dL (ref 8.6–10.4)
Chloride: 105 mmol/L (ref 98–110)
Creat: 0.91 mg/dL (ref 0.50–1.05)
Globulin: 2.4 g/dL (calc) (ref 1.9–3.7)
Glucose, Bld: 97 mg/dL (ref 65–99)
Potassium: 4.7 mmol/L (ref 3.5–5.3)
Sodium: 143 mmol/L (ref 135–146)
Total Bilirubin: 0.4 mg/dL (ref 0.2–1.2)
Total Protein: 6.4 g/dL (ref 6.1–8.1)
eGFR: 71 mL/min/{1.73_m2} (ref >=60)

## 2022-06-18 NOTE — Progress Notes (Signed)
CBC and CMP WNL

## 2022-06-19 ENCOUNTER — Telehealth: Payer: Self-pay | Admitting: Pharmacist

## 2022-06-19 NOTE — Telephone Encounter (Signed)
Received notification from  [facility]  regarding a prior authorization for OTREXUP. Authorization has been APPROVED from 06/09/22 to 06/19/23. Approval letter sent to scan center.  Patient can continue to fill through [facility]: [phone]   Phone # [phone]  Chesley Mires, PharmD, MPH, BCPS, CPP Clinical Pharmacist (Rheumatology and Pulmonology)

## 2022-06-19 NOTE — Telephone Encounter (Signed)
Received fax from [facility] requesting additional clinical information for Otrexup PA. Completed form and faxed with clinicals  Fax: [phone] Phone: [phone] Case: [id number]

## 2022-06-20 ENCOUNTER — Other Ambulatory Visit (HOSPITAL_COMMUNITY): Payer: Self-pay

## 2022-06-23 ENCOUNTER — Telehealth: Payer: Self-pay

## 2022-06-23 NOTE — Telephone Encounter (Signed)
Pt left message to schedule colonoscopy please return call  

## 2022-06-25 ENCOUNTER — Telehealth: Payer: Self-pay | Admitting: Pharmacist

## 2022-06-25 ENCOUNTER — Telehealth: Payer: Self-pay

## 2022-06-25 DIAGNOSIS — Z86010 Personal history of colonic polyps: Secondary | ICD-10-CM

## 2022-06-25 NOTE — Telephone Encounter (Signed)
Submitted a Prior Authorization renewal request to [facility] for ENBREL via fax. Will update once we receive a response.  Fax: [phone] Phone: [phone]  Chesley Mires, PharmD, MPH, BCPS, CPP Clinical Pharmacist (Rheumatology and Pulmonology)

## 2022-06-25 NOTE — Telephone Encounter (Signed)
Pt returned your call to schedule her colonoscopy. Pt stated you two are missing each others call.

## 2022-06-25 NOTE — Telephone Encounter (Signed)
Received notification from [facility] regarding a prior authorization for ENBREL. Authorization has been APPROVED from 06/09/22 to 06/24/25. Approval letter sent to scan center.  Patient can continue to fill through [facility]: [phone]   Chesley Mires, PharmD, MPH, BCPS, CPP Clinical Pharmacist (Rheumatology and Pulmonology)

## 2022-06-25 NOTE — Telephone Encounter (Signed)
Message left for patient to return my call.  

## 2022-06-26 ENCOUNTER — Telehealth: Payer: Self-pay | Admitting: Internal Medicine

## 2022-06-26 ENCOUNTER — Other Ambulatory Visit: Payer: Self-pay

## 2022-06-26 DIAGNOSIS — Z860100 Personal history of colon polyps, unspecified: Secondary | ICD-10-CM

## 2022-06-26 MED ORDER — PREDNISONE 10 MG PO TABS: ORAL_TABLET | ORAL | 0 refills | Status: DC

## 2022-06-26 MED ORDER — NA SULFATE-K SULFATE-MG SULF 17.5-3.13-1.6 GM/177ML PO SOLN
1.0000 | Freq: Once | ORAL | 0 refills | Status: AC
Start: 2022-06-26 — End: 2022-06-26

## 2022-06-26 MED ORDER — AZITHROMYCIN 250 MG PO TABS: ORAL_TABLET | ORAL | 0 refills | Status: DC

## 2022-06-26 NOTE — Telephone Encounter (Signed)
PT not feeling well. Would like Dr. Sherene Sires to call in Pred for her. Pls call @ [phone]  No Fever SOB Inhaler not helping     [facility] [place]

## 2022-06-26 NOTE — Telephone Encounter (Signed)
I have sent in the prescriptions and notified the patient.  Nothing further needed. 

## 2022-06-26 NOTE — Telephone Encounter (Signed)
Gastroenterology Pre-Procedure Review  Request Date: 07/22/22 Requesting Physician: Dr. Tobi Bastos  PATIENT REVIEW QUESTIONS: The patient responded to the following health history questions as indicated:    1. Are you having any GI issues? no 2. Do you have a personal history of Polyps? yes (last colonoscopy performed by Dr. Tobi Bastos 03/15/19) 3. Do you have a family history of Colon Cancer or Polyps? no 4. Diabetes Mellitus? no 5. Joint replacements in the past 12 months?no 6. Major health problems in the past 3 months?no 7. Any artificial heart valves, MVP, or defibrillator?no    MEDICATIONS & ALLERGIES:    Patient reports the following regarding taking any anticoagulation/antiplatelet therapy:   Plavix, Coumadin, Eliquis, Xarelto, Lovenox, Pradaxa, Brilinta, or Effient? no Aspirin? no  Patient confirms/reports the following medications:  Current Outpatient Medications  Medication Sig Dispense Refill   albuterol (PROAIR HFA) 108 (90 Base) MCG/ACT inhaler Inhale 2 puffs into the lungs every 6 (six) hours as needed for wheezing or shortness of breath. 8.5 g 2   Budeson-Glycopyrrol-Formoterol (BREZTRI AEROSPHERE) 160-9-4.8 MCG/ACT AERO Inhale 2 puffs into the lungs 2 (two) times daily. 10.7 g 11   Budeson-Glycopyrrol-Formoterol (BREZTRI AEROSPHERE) 160-9-4.8 MCG/ACT AERO Inhale 2 puffs into the lungs in the morning and at bedtime. 10.7 g 0   Calcium Carb-Cholecalciferol (CALCIUM 1000 + D PO) Take by mouth.     cyanocobalamin 100 MCG tablet Take 100 mcg by mouth daily.     Etanercept (ENBREL MINI) 50 MG/ML SOCT INJECT 50 MG INTO THE SKIN ONCE A WEEK. 4 mL 2   folic acid (FOLVITE) 1 MG tablet Take 2 tablets (2 mg total) by mouth daily. 180 tablet 3   hydrOXYzine (VISTARIL) 25 MG capsule Take 1 capsule (25 mg total) by mouth at bedtime. 30 capsule 3   methotrexate (RHEUMATREX) 2.5 MG tablet Take 8 tablets (20 mg total) by mouth once a week. Caution:Chemotherapy. Protect from light. 32 tablet 2    Methotrexate, PF, (OTREXUP) 20 MG/0.4ML SOAJ Inject 20 mg into the skin once a week. (Patient not taking: Reported on 06/17/2022) 1.6 mL 0   Multiple Vitamin (MULTIVITAMIN WITH MINERALS) TABS tablet Take 1 tablet by mouth daily.     Na Sulfate-K Sulfate-Mg Sulf 17.5-3.13-1.6 GM/177ML SOLN Take 1 kit by mouth once for 1 dose. 354 mL 0   nicotine (NICODERM CQ - DOSED IN MG/24 HR) 7 mg/24hr patch Place 1 patch (7 mg total) onto the skin daily. 28 patch 0   predniSONE (DELTASONE) 5 MG tablet Take 4 tabs po x 4 days, 3  tabs po x 4 days, 2  tabs po x 4 days, 1  tab po x 4 days 40 tablet 0   tamoxifen (NOLVADEX) 20 MG tablet Take 1 tablet (20 mg total) by mouth daily. 90 tablet 1   No current facility-administered medications for this visit.    Patient confirms/reports the following allergies:  Allergies  Allergen Reactions   Trazodone And Nefazodone Other (See Comments)    Makes her gittery   Other Rash    Unknown cleaner used in OR    No orders of the defined types were placed in this encounter.   AUTHORIZATION INFORMATION Primary Insurance: 1D#: Group #:  Secondary Insurance: 1D#: Group #:  SCHEDULE INFORMATION: Date: 07/22/22 Time: Location: [facility]

## 2022-06-26 NOTE — Telephone Encounter (Signed)
Returning call.

## 2022-06-26 NOTE — Telephone Encounter (Signed)
ATC the patient. LVM for patient to return my call. 

## 2022-06-26 NOTE — Telephone Encounter (Signed)
Please see duplicate phone note.   Covid test was neg.

## 2022-06-26 NOTE — Addendum Note (Signed)
Addended by: Bonney Leitz on: 06/26/2022 01:29 PM   Modules accepted: Orders

## 2022-06-26 NOTE — Telephone Encounter (Deleted)
Gastroenterology Pre-Procedure Review  Request Date: 05/14 Requesting Physician: Dr. Tobi Bastos  PATIENT REVIEW QUESTIONS: The patient responded to the following health history questions as indicated:    1. Are you having any GI issues? no 2. Do you have a personal history of Polyps? yes (YES last colonoscopy performed by Dr. Tobi Bastos 03/15/19) 3. Do you have a family history of Colon Cancer or Polyps? no 4. Diabetes Mellitus? no 5. Joint replacements in the past 12 months?no 6. Major health problems in the past 3 months?no 7. Any artificial heart valves, MVP, or defibrillator?no    MEDICATIONS & ALLERGIES:    Patient reports the following regarding taking any anticoagulation/antiplatelet therapy:   Plavix, Coumadin, Eliquis, Xarelto, Lovenox, Pradaxa, Brilinta, or Effient? no Aspirin? no  Patient confirms/reports the following medications:  Current Outpatient Medications  Medication Sig Dispense Refill   albuterol (PROAIR HFA) 108 (90 Base) MCG/ACT inhaler Inhale 2 puffs into the lungs every 6 (six) hours as needed for wheezing or shortness of breath. 8.5 g 2   Budeson-Glycopyrrol-Formoterol (BREZTRI AEROSPHERE) 160-9-4.8 MCG/ACT AERO Inhale 2 puffs into the lungs 2 (two) times daily. 10.7 g 11   Budeson-Glycopyrrol-Formoterol (BREZTRI AEROSPHERE) 160-9-4.8 MCG/ACT AERO Inhale 2 puffs into the lungs in the morning and at bedtime. 10.7 g 0   Calcium Carb-Cholecalciferol (CALCIUM 1000 + D PO) Take by mouth.     cyanocobalamin 100 MCG tablet Take 100 mcg by mouth daily.     Etanercept (ENBREL MINI) 50 MG/ML SOCT INJECT 50 MG INTO THE SKIN ONCE A WEEK. 4 mL 2   folic acid (FOLVITE) 1 MG tablet Take 2 tablets (2 mg total) by mouth daily. 180 tablet 3   hydrOXYzine (VISTARIL) 25 MG capsule Take 1 capsule (25 mg total) by mouth at bedtime. 30 capsule 3   methotrexate (RHEUMATREX) 2.5 MG tablet Take 8 tablets (20 mg total) by mouth once a week. Caution:Chemotherapy. Protect from light. 32 tablet 2    Multiple Vitamin (MULTIVITAMIN WITH MINERALS) TABS tablet Take 1 tablet by mouth daily.     nicotine (NICODERM CQ - DOSED IN MG/24 HR) 7 mg/24hr patch Place 1 patch (7 mg total) onto the skin daily. 28 patch 0   predniSONE (DELTASONE) 5 MG tablet Take 4 tabs po x 4 days, 3  tabs po x 4 days, 2  tabs po x 4 days, 1  tab po x 4 days 40 tablet 0   tamoxifen (NOLVADEX) 20 MG tablet Take 1 tablet (20 mg total) by mouth daily. 90 tablet 1   Methotrexate, PF, (OTREXUP) 20 MG/0.4ML SOAJ Inject 20 mg into the skin once a week. (Patient not taking: Reported on 06/17/2022) 1.6 mL 0   No current facility-administered medications for this visit.    Patient confirms/reports the following allergies:  Allergies  Allergen Reactions   Trazodone And Nefazodone Other (See Comments)    Makes her gittery   Other Rash    Unknown cleaner used in OR    No orders of the defined types were placed in this encounter.   AUTHORIZATION INFORMATION Primary Insurance: 1D#: Group #:  Secondary Insurance: 1D#: Group #:  SCHEDULE INFORMATION: Date: 07/22/22 Time: Location: [facility]

## 2022-06-26 NOTE — Telephone Encounter (Signed)
Colonoscopy has been scheduled with Dr. Tobi Bastos 07/22/22.  Thanks, [place], [place]

## 2022-06-26 NOTE — Telephone Encounter (Signed)
Zpak/ Prednisone 10 mg take  4 each am x 2 days,   2 each am x 2 days,  1 each am x 2 days and stop  

## 2022-06-26 NOTE — Telephone Encounter (Signed)
I spoke with the patient. Symptoms for 2-3 days. SOB- does not check her O2 at home Dry cough Some wheezing. No F/C/S  Taking Benadryl for allergies Breztri- BID  She will Covid test and call back with the results.

## 2022-06-26 NOTE — Telephone Encounter (Signed)
Noted. Please see duplicate phone note.

## 2022-06-26 NOTE — Telephone Encounter (Signed)
PT called saying her Covid test came back neg. See last signed encounter. TY.

## 2022-06-27 ENCOUNTER — Telehealth: Payer: Self-pay

## 2022-06-27 NOTE — Telephone Encounter (Signed)
Call has been returned.  Patients colonoscopy has been rescheduled from 05/14 to 05/21.  Vikkie in Endo has been made aware of date change.  Referral updated.  Instructions updated.  Thanks,  [place], [place]

## 2022-06-27 NOTE — Telephone Encounter (Signed)
Pt wants to reschedule procedure for the following week please return call

## 2022-07-01 NOTE — Progress Notes (Deleted)
Office Visit Note  Patient: [name]             Date of Birth: Jul 25, 1960           MRN: [id number]             PCP: Marcine Matar, MD Referring: Marcine Matar, MD Visit Date: 07/15/2022 Occupation: @  Subjective:  No chief complaint on file.   History of Present Illness: [name] is a 62 y.o. female ***     Activities of Daily Living:  Patient reports morning stiffness for *** {minute/hour:19697}.   Patient {ACTIONS;DENIES/REPORTS:21021675::"Denies"} nocturnal pain.  Difficulty dressing/grooming: {ACTIONS;DENIES/REPORTS:21021675::"Denies"} Difficulty climbing stairs: {ACTIONS;DENIES/REPORTS:21021675::"Denies"} Difficulty getting out of chair: {ACTIONS;DENIES/REPORTS:21021675::"Denies"} Difficulty using hands for taps, buttons, cutlery, and/or writing: {ACTIONS;DENIES/REPORTS:21021675::"Denies"}  No Rheumatology ROS completed.   PMFS History:  Patient Active Problem List   Diagnosis Date Noted   Major depressive disorder, single episode, moderate 06/17/2022   COPD GOLD  3 07/31/2021   Solitary pulmonary nodule on lung CT 07/31/2021   Malignant neoplasm of upper-outer quadrant of left breast in female, estrogen receptor positive 09/15/2019   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   Adenomatous colon polyp 03/16/2019   Degenerative joint disease of shoulder region 06/08/2018   Rheumatoid arthritis involving both hands with positive rheumatoid factor 06/08/2018   Rheumatoid arthritis involving both feet with positive rheumatoid factor 06/08/2018   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   Status post total hip replacement, 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   Smoker 06/08/2018   Anxiety and depression 06/08/2018   Family history of breast cancer 03/25/2018   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   Rheumatoid arthritis involving multiple sites 01/15/2018   Tobacco dependence 01/15/2018   Opioid use agreement exists 01/15/2018   Positive depression  screening 01/15/2018   Homeless 01/15/2018    Past Medical History:  Diagnosis Date   Anemia    during pregnancy   Anxiety    Asthma    Bronchitis age 65   Cancer    left breast ca   COPD (chronic obstructive pulmonary disease)    Depression    Hx of bladder infections    been over 5 years since last one    Malignant neoplasm of upper-outer quadrant of left breast in female, estrogen receptor positive    Rheumatoid arthritis    unc rheum    Tobacco abuse    Trouble in sleeping     Family History  Problem Relation Age of Onset   Breast cancer Mother 55       metastatic   Cervical cancer Mother    Lung cancer Brother        maternal half brother; d. 6s   Lung cancer Maternal Uncle        d. 27s   Lung cancer Maternal Uncle        d. 92   Cancer Maternal Grandmother        unknown type; dx 64s   Healthy Son    Breast cancer Maternal Great-grandmother        dx unknown age   Past Surgical History:  Procedure Laterality Date   BREAST LUMPECTOMY WITH RADIOACTIVE SEED AND SENTINEL LYMPH NODE BIOPSY Left 08/30/2019   Procedure: LEFT BREAST LUMPECTOMY X 2 WITH RADIOACTIVE SEED AND SENTINEL LYMPH NODE BIOPSY;  Surgeon: Griselda Miner, MD;  Location: [facility] OR;  Service: General;  Laterality: Left;  PEC BLOCK   COLONOSCOPY WITH PROPOFOL N/A 03/15/2019   Procedure:  COLONOSCOPY WITH PROPOFOL;  Surgeon: Anna, Kiran, MD;  [facility];  Service: Gastroenterology;  Laterality: N/A;   HIP ARTHROPLASTY     JOINT REPLACEMENT     MULTIPLE TOOTH EXTRACTIONS     RE-EXCISION OF BREAST LUMPECTOMY Left 10/07/2019   Procedure: RE-EXCISION OF LEFT BREAST INFERIOR MARGIN;  Surgeon: Griselda Miner, MD;  Location:  SURGERY CENTER;  Service: General;  Laterality: Left;   TOTAL HIP ARTHROPLASTY Left 02/15/2018   Procedure: TOTAL HIP ARTHROPLASTY ANTERIOR APPROACH;  Surgeon: Gean Birchwood, MD;  Location: [facility];  Service: Orthopedics;  Laterality: Left;   WISDOM TOOTH EXTRACTION      Social History   Social History Narrative   Not on file   Immunization History  Administered Date(s) Administered   Influenza,inj,Quad PF,6+ Mos 12/18/2020, 03/17/2022   PFIZER(Purple Top)SARS-COV-2 Vaccination 06/27/2019, 07/19/2019   Pneumococcal Conjugate-13 07/14/2018   Tdap 03/25/2018   Zoster Recombinat (Shingrix) 07/14/2018, 06/17/2022     Objective: Vital Signs: There were no vitals taken for this visit.   Physical Exam   Musculoskeletal Exam: ***  CDAI Exam: CDAI Score: -- Patient Global: --; Provider Global: -- Swollen: --; Tender: -- Joint Exam 07/15/2022   No joint exam has been documented for this visit   There is currently no information documented on the homunculus. Go to the Rheumatology activity and complete the homunculus joint exam.  Investigation: No additional findings.  Imaging: No results found.  Recent Labs: Lab Results  Component Value Date   WBC 5.4 06/17/2022   HGB 13.9 06/17/2022   PLT 196 06/17/2022   NA 143 06/17/2022   K 4.7 06/17/2022   CL 105 06/17/2022   CO2 28 06/17/2022   GLUCOSE 97 06/17/2022   BUN 19 06/17/2022   CREATININE 0.91 06/17/2022   BILITOT 0.4 06/17/2022   ALKPHOS 66 12/14/2020   AST 18 06/17/2022   ALT 15 06/17/2022   PROT 6.4 06/17/2022   ALBUMIN 4.4 12/14/2020   CALCIUM 9.5 06/17/2022   GFRAA 101 07/30/2020   QFTBGOLDPLUS NEGATIVE 08/19/2021    Speciality Comments: No specialty comments available.  Procedures:  No procedures performed Allergies: Trazodone and nefazodone and Other   Assessment / Plan:     Visit Diagnoses: No diagnosis found.  Orders: No orders of the defined types were placed in this encounter.  No orders of the defined types were placed in this encounter.   Face-to-face time spent with patient was *** minutes. Greater than 50% of time was spent in counseling and coordination of care.  Follow-Up Instructions: No follow-ups on file.   Ellen Henri, CMA  Note -  This record has been created using Animal nutritionist.  Chart creation errors have been sought, but may not always  have been located. Such creation errors do not reflect on  the standard of medical care.

## 2022-07-03 ENCOUNTER — Ambulatory Visit: Payer: Medicare Other | Admitting: Internal Medicine

## 2022-07-07 ENCOUNTER — Encounter: Payer: Self-pay | Admitting: Internal Medicine

## 2022-07-07 ENCOUNTER — Other Ambulatory Visit (HOSPITAL_COMMUNITY): Payer: Self-pay

## 2022-07-07 ENCOUNTER — Inpatient Hospital Stay: Payer: No Typology Code available for payment source | Attending: Internal Medicine | Admitting: Internal Medicine

## 2022-07-07 VITALS — BP 126/77 | HR 104 | Temp 98.8°F | Ht 65.0 in | Wt 114.4 lb

## 2022-07-07 DIAGNOSIS — C50412 Malignant neoplasm of upper-outer quadrant of left female breast: Secondary | ICD-10-CM | POA: Insufficient documentation

## 2022-07-07 DIAGNOSIS — Z87891 Personal history of nicotine dependence: Secondary | ICD-10-CM | POA: Insufficient documentation

## 2022-07-07 DIAGNOSIS — Z79810 Long term (current) use of selective estrogen receptor modulators (SERMs): Secondary | ICD-10-CM | POA: Insufficient documentation

## 2022-07-07 DIAGNOSIS — Z170 Estrogen receptor positive status [ER+]: Secondary | ICD-10-CM | POA: Diagnosis not present

## 2022-07-07 NOTE — Assessment & Plan Note (Signed)
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  Status postlumpectomy followed by radiation.  No chemotherapy.  On adjuvant-tamoxifen 20 mg qhs [NOV, 01/2021- NOV 2027].  Patient tolerated tamoxifen well.  Stable. DEC 2023-  mammo- BIL- WNL.  # Incidental left axillary lymphadenopathy [on lung cancer screening]; UNABLE-to perform biopsy left axilla lymph node [MAY/ June 12th, 2023]-because of blood vessels.   No evidence of malignancy within either breast. Stable postsurgical changes within the LEFT breast;  Stable probably benign reactive lymph nodes in the LEFT axilla. Recommend additional follow-up LEFT axillary ultrasound in 6 months to ensure continued stability- ordered today.    #Lung cancer screening: SEP 2023- Bi Rad 2- continue follow up in 12 months/PCP   stable  #Rheumatoid arthritis: [Dr.Deweshwer; [place]]-methotrexate/; [facility]  # DISPOSITION: # LEFT axillary ultrasound in June 2024-  #  follow up in 6 months; MD;  no labs- Dr.B

## 2022-07-07 NOTE — Progress Notes (Signed)
No concerns today 

## 2022-07-07 NOTE — Progress Notes (Signed)
[facility] [place] NOTE  Patient Care Team: Ladell Pier, MD as PCP - General (Internal Medicine) [name], MD as Consulting Physician (General Surgery) Bo Merino, MD as Consulting Physician (Rheumatology) Beverely Pace, LCSW (Inactive) as Social Worker ([place]) Su Monks as Physician Assistant (Physician Assistant) Cammie Sickle, MD as Consulting Physician (Oncology)  CHIEF COMPLAINTS/PURPOSE OF CONSULTATION: Breast cancer  #  Oncology History Overview Note  2021- FINAL MICROSCOPIC DIAGNOSIS:   A. BREAST, LEFT, LUMPECTOMY:  - Invasive ductal carcinoma, 1.5 cm.  - Ductal carcinoma in situ with calcifications.  - Invasive carcinoma 0.4 cm from posterior margin.  - DCIS focally involves inferior margin.  - Fibrocystic changes with sclerosing adenosis and calcifications.  - See oncology table.   B. LYMPH NODE, LEFT AXILLARY, SENTINEL, BIOPSY:  - One lymph node with no metastatic carcinoma (0/1).   C. LYMPH NODE, LEFT AXILLARY, SENTINEL, BIOPSY:  - One lymph node with no metastatic carcinoma (0/1).   D. LYMPH NODE, LEFT AXILLARY, SENTINEL, BIOPSY:  - One lymph node with no metastatic carcinoma (0/1).   E. LYMPH NODE, LEFT AXILLARY, SENTINEL, BIOPSY:  - One lymph node with no metastatic carcinoma (0/1).   F. BREAST, LEFT ADDITIONAL MEDIAL MARGIN, EXCISION:  - Fibrocystic changes with sclerosing adenosis and calcifications.  - No malignancy identified.  - Final medial margin clear.   G. BREAST, LEFT ADDITIONAL SUPERIOR MARGIN, EXCISION:  - Fibrocystic changes with sclerosing adenosis and calcifications.  - No malignancy identified.  - Final superior margin clear.   ONCOLOGY TABLE:  INVASIVE CARCINOMA OF THE BREAST:  Resection  Procedure: Left breast lumpectomy, 4 left axillary sentinel lymph nodes  with additional medial margin and additional superior margin biopsies.  Specimen Laterality: Left breast.   Tumor Size: 1.5 cm.  Histologic Type: Ductal.  Histologic Grade:       Glandular (Acinar)/Tubular Differentiation: 1.       Nuclear Pleomorphism: 1.       Mitotic Rate: 1.       Overall Grade: 1  Ductal Carcinoma In Situ: Present.  Margins: Free of invasive carcinoma.       Distance from closest margin (millimeters): 4 mm.       Specify closest margin (required only if <23m): Posterior.  DCIS Margins: Focally involves inferior margin.       Distance from closest margin (millimeters): 0 mm.       Specify closest margin (required only if <122m: Inferior.  Regional Lymph Nodes:       Number of Lymph Nodes Examined: 4       Number of Sentinel Nodes Examined (if applicable): 4       Number of Lymph Nodes with Macrometastases (>2 mm): 0       Number of Lymph Nodes with Micrometastases: 0       Number of Lymph Nodes with Isolated Tumor Cells (=0.2 mm or =200  cells): 0  Treatment Effect:  No known presurgical therapy  Breast Biomarker Testing Performed on Previous Biopsy:        Testing Performed on Case Number: [id number].             Estrogen Receptor: 95%, positive, strong staining.             Progesterone Receptor: 2%, positive, strong staining.             HER2: Negative with IHC.  Ki-67: 1%.    [name]  06/13/2019 Cancer Staging   Staging form: Breast, AJCC 8th Edition - Clinical stage from 06/13/2019: Stage 0 (cTis (DCIS), cN0, cM0, ER+, PR+)   06/15/2019 Initial Diagnosis   [name]   Malignant neoplasm of upper-outer quadrant of left breast in female, estrogen receptor positive ([place])  09/15/2019 Initial Diagnosis   Malignant neoplasm of upper-outer quadrant of left breast in female, estrogen receptor positive ([place])      HISTORY OF PRESENTING ILLNESS: Alone.  Ambulating independently. [name] 62 y.o.  female with a history of LEFT breast cancer stage I  ER/PR positive HER2  negative; history of smoking; longstanding anxiety/depression here for follow-up/review her lymph node left axillary is here for follow-up/review results of the ultrasound of the left underarm.  Patient states no concerns.  Patient denies any new onset of any lumps or bumps.  Denies any unusual shortness of breath or cough.  She is gaining weight.  She seems to be tolerating tamoxifen fairly well.  Review of Systems  Constitutional:  Positive for malaise/fatigue. Negative for chills, diaphoresis, fever and weight loss.  HENT:  Negative for nosebleeds and sore throat.   Eyes:  Negative for double vision.  Respiratory:  Negative for cough, hemoptysis, sputum production, shortness of breath and wheezing.   Cardiovascular:  Negative for chest pain, palpitations, orthopnea and leg swelling.  Gastrointestinal:  Negative for abdominal pain, blood in stool, constipation, diarrhea, heartburn, melena, nausea and vomiting.  Genitourinary:  Negative for dysuria, frequency and urgency.  Musculoskeletal:  Positive for back pain, joint pain and neck pain.  Skin: Negative.  Negative for itching and rash.  Neurological:  Negative for dizziness, tingling, focal weakness, weakness and headaches.  Endo/Heme/Allergies:  Does not bruise/bleed easily.  Psychiatric/Behavioral:  Negative for depression. The patient is nervous/anxious. The patient does not have insomnia.      MEDICAL HISTORY:  Past Medical History:  Diagnosis Date   Anemia    during pregnancy   Anxiety    Asthma    Bronchitis age 60   Cancer ([place])    left breast ca   COPD (chronic obstructive pulmonary disease) (HCC)    Depression    Hx of bladder infections    been over 5 years since last one    Malignant neoplasm of upper-outer quadrant of left breast in female, estrogen receptor positive ([place])    Rheumatoid arthritis ([place])    unc rheum    Tobacco abuse    Trouble in sleeping     SURGICAL HISTORY: Past Surgical History:   Procedure Laterality Date   BREAST LUMPECTOMY WITH RADIOACTIVE SEED AND SENTINEL LYMPH NODE BIOPSY Left 08/30/2019   Procedure: LEFT BREAST LUMPECTOMY X 2 WITH RADIOACTIVE SEED AND SENTINEL LYMPH NODE BIOPSY;  Surgeon: [name], MD;  Location: [facility];  Service: General;  Laterality: Left;  PEC BLOCK   COLONOSCOPY WITH PROPOFOL N/A 03/15/2019   Procedure: COLONOSCOPY WITH PROPOFOL;  Surgeon: Jonathon Bellows, MD;  Location: [facility] ENDOSCOPY;  Service: Gastroenterology;  Laterality: N/A;   HIP ARTHROPLASTY     JOINT REPLACEMENT     MULTIPLE TOOTH EXTRACTIONS     RE-EXCISION OF BREAST LUMPECTOMY Left 10/07/2019   Procedure: RE-EXCISION OF LEFT BREAST INFERIOR MARGIN;  Surgeon: [name], MD;  Location: [facility];  Service: General;  Laterality: Left;   TOTAL HIP ARTHROPLASTY Left 02/15/2018   Procedure:  TOTAL HIP ARTHROPLASTY ANTERIOR APPROACH;  Surgeon: Gean Birchwood, MD;  Location: [facility];  Service: Orthopedics;  Laterality: Left;   WISDOM TOOTH EXTRACTION      SOCIAL HISTORY: Social History   Socioeconomic History   Marital status: Single    Spouse name: Not on file   Number of children: 1   Years of education: 14 years   Highest education level: Some college, no degree  Occupational History   Not on file  Tobacco Use   Smoking status: Former    Packs/day: 1.00    Years: 40.00    Additional pack years: 0.00    Total pack years: 40.00    Types: Cigarettes    Quit date: 03/10/2021    Years since quitting: 1.3    Passive exposure: Past   Smokeless tobacco: Never   Tobacco comments:    on nicotine patches  Vaping Use   Vaping Use: Never used  Substance and Sexual Activity   Alcohol use: Yes    Comment: Occassionally   Drug use: Not Currently    Types: Cocaine   Sexual activity: Not Currently    Birth control/protection: Post-menopausal  Other Topics Concern   Not on file  Social History Narrative   Not on file   Social Determinants of Health   Financial  Resource Strain: Medium Risk (05/27/2021)   Overall Financial Resource Strain (CARDIA)    Difficulty of Paying Living Expenses: Somewhat hard  Food Insecurity: No Food Insecurity (08/09/2021)   Hunger Vital Sign    Worried About Running Out of Food in the Last Year: Never true    Ran Out of Food in the Last Year: Never true  Transportation Needs: No Transportation Needs (08/09/2021)   PRAPARE - Administrator, Civil Service (Medical): No    Lack of Transportation (Non-Medical): No  Recent Concern: Transportation Needs - Unmet Transportation Needs (05/27/2021)   PRAPARE - Transportation    Lack of Transportation (Medical): Yes    Lack of Transportation (Non-Medical): Yes  Physical Activity: Insufficiently Active (08/09/2021)   Exercise Vital Sign    Days of Exercise per Week: 4 days    Minutes of Exercise per Session: 30 min  Stress: Stress Concern Present (05/27/2021)   [employer] of Occupational Health - Occupational Stress Questionnaire    Feeling of Stress : To some extent  Social Connections: Socially Isolated (05/27/2021)   Social Connection and Isolation Panel [NHANES]    Frequency of Communication with Friends and Family: Once a week    Frequency of Social Gatherings with Friends and Family: Twice a week    Attends Religious Services: Never    Database administrator or Organizations: No    Attends Banker Meetings: Never    Marital Status: Divorced  Catering manager Violence: Not At Risk (08/09/2021)   Humiliation, Afraid, Rape, and Kick questionnaire    Fear of Current or Ex-Partner: No    Emotionally Abused: No    Physically Abused: No    Sexually Abused: No    FAMILY HISTORY: Family History  Problem Relation Age of Onset   Breast cancer Mother 37       metastatic   Cervical cancer Mother    Lung cancer Brother        maternal half brother; d. 18s   Lung cancer Maternal Uncle        d. 65s   Lung cancer Maternal Uncle        d.  45   Cancer  Maternal Grandmother        unknown type; dx 57s   Healthy Son    Breast cancer Maternal Great-grandmother        dx unknown age    ALLERGIES:  is allergic to trazodone and nefazodone and other.  MEDICATIONS:  Current Outpatient Medications  Medication Sig Dispense Refill   albuterol (PROAIR HFA) 108 (90 Base) MCG/ACT inhaler Inhale 2 puffs into the lungs every 6 (six) hours as needed for wheezing or shortness of breath. 8.5 g 2   azithromycin (ZITHROMAX) 250 MG tablet Take 2 tablets (500 mg) on  Day 1,  followed by 1 tablet (250 mg) once daily on Days 2 through 5. 6 each 0   Budeson-Glycopyrrol-Formoterol (BREZTRI AEROSPHERE) 160-9-4.8 MCG/ACT AERO Inhale 2 puffs into the lungs 2 (two) times daily. 10.7 g 11   Budeson-Glycopyrrol-Formoterol (BREZTRI AEROSPHERE) 160-9-4.8 MCG/ACT AERO Inhale 2 puffs into the lungs in the morning and at bedtime. 10.7 g 0   Calcium Carb-Cholecalciferol (CALCIUM 1000 + D PO) Take by mouth.     cyanocobalamin 100 MCG tablet Take 100 mcg by mouth daily.     Etanercept (ENBREL MINI) 50 MG/ML SOCT INJECT 50 MG INTO THE SKIN ONCE A WEEK. 4 mL 2   folic acid (FOLVITE) 1 MG tablet Take 2 tablets (2 mg total) by mouth daily. 180 tablet 3   hydrOXYzine (VISTARIL) 25 MG capsule Take 1 capsule (25 mg total) by mouth at bedtime. 30 capsule 3   methotrexate (RHEUMATREX) 2.5 MG tablet Take 8 tablets (20 mg total) by mouth once a week. Caution:Chemotherapy. Protect from light. 32 tablet 2   Multiple Vitamin (MULTIVITAMIN WITH MINERALS) TABS tablet Take 1 tablet by mouth daily.     nicotine (NICODERM CQ - DOSED IN MG/24 HR) 7 mg/24hr patch Place 1 patch (7 mg total) onto the skin daily. 28 patch 0   predniSONE (DELTASONE) 10 MG tablet Take 4 tablets each am x 2 days, 2 tablets each am x 2 days, 1 tablet each am x 2 days and stop 14 tablet 0   tamoxifen (NOLVADEX) 20 MG tablet Take 1 tablet (20 mg total) by mouth daily. 90 tablet 1   Methotrexate, PF, (OTREXUP) 20 MG/0.4ML  SOAJ Inject 20 mg into the skin once a week. (Patient not taking: Reported on 06/17/2022) 1.6 mL 0   No current facility-administered medications for this visit.    PHYSICAL EXAMINATION:  Vitals:   07/07/22 1523  BP: 126/77  Pulse: (!) 104  Temp: 98.8 F (37.1 C)  SpO2: 94%   Filed Weights   07/07/22 1523  Weight: 114 lb 6.4 oz (51.9 kg)    Physical Exam Vitals and nursing note reviewed.  HENT:     Head: Normocephalic and atraumatic.     Mouth/Throat:     Pharynx: Oropharynx is clear.  Eyes:     Extraocular Movements: Extraocular movements intact.     Pupils: Pupils are equal, round, and reactive to light.  Cardiovascular:     Rate and Rhythm: Normal rate and regular rhythm.  Pulmonary:     Comments: Decreased breath sounds bilaterally.  Abdominal:     Palpations: Abdomen is soft.  Musculoskeletal:        General: Normal range of motion.     Cervical back: Normal range of motion.  Skin:    General: Skin is warm.  Neurological:     General: No focal deficit present.     Mental  Status: She is alert and oriented to person, place, and time.  Psychiatric:        Behavior: Behavior normal.        Judgment: Judgment normal.      LABORATORY DATA:  I have reviewed the data as listed Lab Results  Component Value Date   WBC 5.4 06/17/2022   HGB 13.9 06/17/2022   HCT 40.8 06/17/2022   MCV 91.3 06/17/2022   PLT 196 06/17/2022   Recent Labs    08/19/21 1354 02/11/22 1430 06/17/22 1604  NA 141 145 143  K 4.9 4.8 4.7  CL 102 104 105  CO2 30 31 28   GLUCOSE 92 80 97  BUN 22 12 19   CREATININE 0.85 0.88 0.91  CALCIUM 9.4 9.3 9.5  PROT 6.3 6.7 6.4  AST 14 18 18   ALT 10 13 15   BILITOT 0.4 0.4 0.4    RADIOGRAPHIC STUDIES: I have personally reviewed the radiological images as listed and agreed with the findings in the report. No results found.  ASSESSMENT & PLAN:   Malignant neoplasm of upper-outer quadrant of left breast in female, estrogen receptor  positive (HCC) #ER/PR positive HER2 negative stage I breast cancer-left breast.  Status postlumpectomy followed by radiation.  No chemotherapy.  On adjuvant-tamoxifen 20 mg qhs [NOV, 01/2021- NOV 2027].  Patient tolerated tamoxifen well.  Stable. DEC 2023-  mammo- BIL- WNL.  # Incidental left axillary lymphadenopathy [on lung cancer screening]; UNABLE-to perform biopsy left axilla lymph node [MAY/ June 12th, 2023]-because of blood vessels.   No evidence of malignancy within either breast. Stable postsurgical changes within the LEFT breast;  Stable probably benign reactive lymph nodes in the LEFT axilla. Recommend additional follow-up LEFT axillary ultrasound in 6 months to ensure continued stability- ordered today.    #Lung cancer screening: SEP 2023- Bi Rad 2- continue follow up in 12 months/PCP   stable  #Rheumatoid arthritis: [Dr.Deweshwer; [place]]-methotrexate/; Biologics  # DISPOSITION: # LEFT axillary ultrasound in June 2024-  #  follow up in 6 months; MD;  no labs- Dr.B   All questions were answered. The patient/family knows to call the clinic with any problems, questions or concerns.     Earna Coder, MD 07/07/2022 3:53 PM

## 2022-07-10 ENCOUNTER — Other Ambulatory Visit (HOSPITAL_COMMUNITY): Payer: Self-pay

## 2022-07-15 ENCOUNTER — Ambulatory Visit: Payer: Medicare Other | Admitting: Rheumatology

## 2022-07-15 DIAGNOSIS — Z96642 Presence of left artificial hip joint: Secondary | ICD-10-CM

## 2022-07-15 DIAGNOSIS — M0579 Rheumatoid arthritis with rheumatoid factor of multiple sites without organ or systems involvement: Secondary | ICD-10-CM

## 2022-07-15 DIAGNOSIS — Z862 Personal history of diseases of the blood and blood-forming organs and certain disorders involving the immune mechanism: Secondary | ICD-10-CM

## 2022-07-15 DIAGNOSIS — G8929 Other chronic pain: Secondary | ICD-10-CM

## 2022-07-15 DIAGNOSIS — F32A Depression, unspecified: Secondary | ICD-10-CM

## 2022-07-15 DIAGNOSIS — M24522 Contracture, left elbow: Secondary | ICD-10-CM

## 2022-07-15 DIAGNOSIS — R911 Solitary pulmonary nodule: Secondary | ICD-10-CM

## 2022-07-15 DIAGNOSIS — D0512 Intraductal carcinoma in situ of left breast: Secondary | ICD-10-CM

## 2022-07-15 DIAGNOSIS — Z803 Family history of malignant neoplasm of breast: Secondary | ICD-10-CM

## 2022-07-15 DIAGNOSIS — Z79899 Other long term (current) drug therapy: Secondary | ICD-10-CM

## 2022-07-15 DIAGNOSIS — M19211 Secondary osteoarthritis, right shoulder: Secondary | ICD-10-CM

## 2022-07-15 DIAGNOSIS — J438 Other emphysema: Secondary | ICD-10-CM

## 2022-07-15 DIAGNOSIS — F17200 Nicotine dependence, unspecified, uncomplicated: Secondary | ICD-10-CM

## 2022-07-16 ENCOUNTER — Other Ambulatory Visit: Payer: Self-pay | Admitting: Rheumatology

## 2022-07-16 ENCOUNTER — Other Ambulatory Visit (HOSPITAL_COMMUNITY): Payer: Self-pay

## 2022-07-16 ENCOUNTER — Other Ambulatory Visit: Payer: Self-pay | Admitting: Physician Assistant

## 2022-07-16 DIAGNOSIS — M0579 Rheumatoid arthritis with rheumatoid factor of multiple sites without organ or systems involvement: Secondary | ICD-10-CM

## 2022-07-16 MED ORDER — OTREXUP 20 MG/0.4ML ~~LOC~~ SOAJ
20.0000 mg | SUBCUTANEOUS | 2 refills | Status: DC
Start: 2022-07-16 — End: 2022-12-25
  Filled 2022-07-16: qty 1.6, 28d supply, fill #0
  Filled 2022-10-06: qty 1.6, 28d supply, fill #1
  Filled 2022-11-24: qty 1.6, 28d supply, fill #2

## 2022-07-16 MED ORDER — ENBREL MINI 50 MG/ML ~~LOC~~ SOCT
50.0000 mg | SUBCUTANEOUS | 2 refills | Status: DC
  Filled 2022-07-16: qty 4, 28d supply, fill #0
  Filled 2022-08-06: qty 4, 28d supply, fill #1
  Filled 2022-12-03: qty 4, 28d supply, fill #2

## 2022-07-16 NOTE — Telephone Encounter (Signed)
Last Fill: 04/10/2022  Labs: 06/17/2022 CBC and CMP WNL   TB Gold: 08/19/2021 Neg    Next Visit: 08/13/2022  Last Visit: 02/11/2022  DX: Rheumatoid arthritis involving multiple sites with positive rheumatoid factor   Current Dose per office note 02/11/2022: Enbrel 50 mg sq injections once weekly   Okay to refill Enbrel?

## 2022-07-16 NOTE — Telephone Encounter (Signed)
Last Fill: 06/11/2022 (30- day supply)    Labs: 06/17/2022 CBC and CMP WNL   Next Visit: 08/13/2022   Last Visit: 02/11/2022   DX: Rheumatoid arthritis involving multiple sites with positive rheumatoid factor   Current Dose per office note 02/11/2022:  Otrexup 20 mg sq injections once weekly   Okay to refill Otrexup?

## 2022-07-17 ENCOUNTER — Other Ambulatory Visit (HOSPITAL_COMMUNITY): Payer: Self-pay

## 2022-07-18 ENCOUNTER — Other Ambulatory Visit: Payer: Self-pay

## 2022-07-18 ENCOUNTER — Ambulatory Visit: Payer: No Typology Code available for payment source | Admitting: Internal Medicine

## 2022-07-29 ENCOUNTER — Ambulatory Visit: Payer: No Typology Code available for payment source | Admitting: Anesthesiology

## 2022-07-29 ENCOUNTER — Encounter
Admission: RE | Disposition: A | Discharge status: Home / Self Care | Payer: Self-pay | Source: Home / Self Care | Attending: Gastroenterology

## 2022-07-29 ENCOUNTER — Encounter: Payer: Self-pay | Admitting: Gastroenterology

## 2022-07-29 ENCOUNTER — Ambulatory Visit
Admission: RE | Admit: 2022-07-29 | Discharge: 2022-07-29 | Disposition: A | Discharge status: Home / Self Care | Payer: No Typology Code available for payment source | Attending: Gastroenterology | Admitting: Gastroenterology

## 2022-07-29 DIAGNOSIS — K5730 Diverticulosis of large intestine without perforation or abscess without bleeding: Secondary | ICD-10-CM | POA: Diagnosis not present

## 2022-07-29 DIAGNOSIS — Z08 Encounter for follow-up examination after completed treatment for malignant neoplasm: Secondary | ICD-10-CM | POA: Diagnosis not present

## 2022-07-29 DIAGNOSIS — J449 Chronic obstructive pulmonary disease, unspecified: Secondary | ICD-10-CM | POA: Diagnosis not present

## 2022-07-29 DIAGNOSIS — M069 Rheumatoid arthritis, unspecified: Secondary | ICD-10-CM | POA: Diagnosis not present

## 2022-07-29 DIAGNOSIS — D124 Benign neoplasm of descending colon: Secondary | ICD-10-CM | POA: Insufficient documentation

## 2022-07-29 DIAGNOSIS — Z09 Encounter for follow-up examination after completed treatment for conditions other than malignant neoplasm: Secondary | ICD-10-CM | POA: Diagnosis not present

## 2022-07-29 DIAGNOSIS — Z87891 Personal history of nicotine dependence: Secondary | ICD-10-CM | POA: Insufficient documentation

## 2022-07-29 DIAGNOSIS — K635 Polyp of colon: Secondary | ICD-10-CM | POA: Diagnosis not present

## 2022-07-29 DIAGNOSIS — Z5982 Transportation insecurity: Secondary | ICD-10-CM | POA: Diagnosis not present

## 2022-07-29 DIAGNOSIS — Z5986 Financial insecurity: Secondary | ICD-10-CM | POA: Diagnosis not present

## 2022-07-29 DIAGNOSIS — Z86010 Personal history of colonic polyps: Secondary | ICD-10-CM | POA: Insufficient documentation

## 2022-07-29 DIAGNOSIS — Z1211 Encounter for screening for malignant neoplasm of colon: Secondary | ICD-10-CM | POA: Insufficient documentation

## 2022-07-29 DIAGNOSIS — Z853 Personal history of malignant neoplasm of breast: Secondary | ICD-10-CM | POA: Diagnosis not present

## 2022-07-29 DIAGNOSIS — D126 Benign neoplasm of colon, unspecified: Secondary | ICD-10-CM | POA: Diagnosis not present

## 2022-07-29 HISTORY — PX: COLONOSCOPY WITH PROPOFOL: SHX5780

## 2022-07-29 SURGERY — COLONOSCOPY WITH PROPOFOL
Anesthesia: General

## 2022-07-29 MED ORDER — PHENYLEPHRINE HCL (PRESSORS) 10 MG/ML IV SOLN
INTRAVENOUS | Status: DC | PRN
  Administered 2022-07-29 (×2): 80 ug via INTRAVENOUS

## 2022-07-29 MED ORDER — PROPOFOL 10 MG/ML IV BOLUS
INTRAVENOUS | Status: DC | PRN
  Administered 2022-07-29: 30 mg via INTRAVENOUS
  Administered 2022-07-29: 80 mg via INTRAVENOUS
  Administered 2022-07-29 (×2): 20 mg via INTRAVENOUS

## 2022-07-29 MED ORDER — SODIUM CHLORIDE 0.9 % IV SOLN: INTRAVENOUS | Status: DC

## 2022-07-29 MED ORDER — LIDOCAINE HCL (PF) 2 % IJ SOLN
INTRAMUSCULAR | Status: AC
  Filled 2022-07-29: qty 10

## 2022-07-29 MED ORDER — PROPOFOL 10 MG/ML IV BOLUS
INTRAVENOUS | Status: AC
  Filled 2022-07-29: qty 20

## 2022-07-29 MED ORDER — LIDOCAINE HCL (CARDIAC) PF 100 MG/5ML IV SOSY
PREFILLED_SYRINGE | INTRAVENOUS | Status: DC | PRN
  Administered 2022-07-29: 50 mg via INTRAVENOUS

## 2022-07-29 MED ORDER — PROPOFOL 1000 MG/100ML IV EMUL
INTRAVENOUS | Status: AC
  Filled 2022-07-29: qty 100

## 2022-07-29 NOTE — Op Note (Signed)
[facility] Gastroenterology Patient Name: [name] Procedure Date: 07/29/2022 7:39 AM MRN: [id number] Account #: [id number] Date of Birth: May 12, 1960 Admit Type: Outpatient Age: 62 Room: [facility] ENDO ROOM 2 Gender: Female Note Status: Finalized Instrument Name: Prentice Docker [id number] Procedure:             Colonoscopy Indications:           Surveillance: Personal history of adenomatous polyps                         on last colonoscopy > 3 years ago Providers:             Wyline Mood MD, MD Referring MD:          Wyline Mood MD, MD (Referring MD), Marcine Matar                         (Referring MD) Medicines:             Monitored Anesthesia Care Complications:         No immediate complications. Procedure:             Pre-Anesthesia Assessment:                        - Prior to the procedure, a History and Physical was                         performed, and patient medications, allergies and                         sensitivities were reviewed. The patient's tolerance                         of previous anesthesia was reviewed.                        - The risks and benefits of the procedure and the                         sedation options and risks were discussed with the                         patient. All questions were answered and informed                         consent was obtained.                        - ASA Grade Assessment: II - A patient with mild                         systemic disease.                        After obtaining informed consent, the colonoscope was                         passed under direct vision. Throughout the procedure,                         the  patient's blood pressure, pulse, and oxygen                         saturations were monitored continuously. The                         Colonoscope was introduced through the anus and                         advanced to the the cecum, identified by the                          appendiceal orifice. The colonoscopy was performed                         with ease. The patient tolerated the procedure well.                         The quality of the bowel preparation was excellent.                         The ileocecal valve, appendiceal orifice, and rectum                         were photographed. Findings:      The perianal and digital rectal examinations were normal.      A 5 mm polyp was found in the descending colon. The polyp was sessile.       The polyp was removed with a cold snare. Resection was complete, but the       polyp tissue was not retrieved.      A few small-mouthed diverticula were found in the entire colon.      The exam was otherwise without abnormality on direct and retroflexion       views. Impression:            - One 5 mm polyp in the descending colon, removed with                         a cold snare. Complete resection. Polyp tissue not                         retrieved.                        - Diverticulosis in the entire examined colon.                        - The examination was otherwise normal on direct and                         retroflexion views. Recommendation:        - Discharge patient to home (with escort).                        - Resume previous diet.                        - Continue present medications.                        -  Repeat colonoscopy in 7 years for surveillance. Procedure Code(s):     --- Professional ---                        [id number], Colonoscopy, flexible; with removal of                         tumor(s), polyp(s), or other lesion(s) by snare                         technique Diagnosis Code(s):     --- Professional ---                        Z86.010, Personal history of colonic polyps                        K57.30, Diverticulosis of large intestine without                         perforation or abscess without bleeding                        D12.4, Benign neoplasm of descending colon CPT copyright 2022  American Medical Association. All rights reserved. The codes documented in this report are preliminary and upon coder review may  be revised to meet current compliance requirements. Wyline Mood, MD Wyline Mood MD, MD 07/29/2022 7:57:09 AM This report has been signed electronically. Number of Addenda: 0 Note Initiated On: 07/29/2022 7:39 AM Scope Withdrawal Time: 0 hours 8 minutes 35 seconds  Total Procedure Duration: 0 hours 10 minutes 46 seconds  Estimated Blood Loss:  Estimated blood loss: none.      [facility]

## 2022-07-29 NOTE — Anesthesia Postprocedure Evaluation (Signed)
Anesthesia Post Note  Patient: [name]  Procedure(s) Performed: COLONOSCOPY WITH PROPOFOL  Patient location during evaluation: Endoscopy Anesthesia Type: General Level of consciousness: awake and alert Pain management: pain level controlled Vital Signs Assessment: post-procedure vital signs reviewed and stable Respiratory status: spontaneous breathing, nonlabored ventilation, respiratory function stable and patient connected to nasal cannula oxygen Cardiovascular status: blood pressure returned to baseline and stable Postop Assessment: no apparent nausea or vomiting Anesthetic complications: no   No notable events documented.   Last Vitals:  Vitals:   07/29/22 0810 07/29/22 0820  BP: 98/70 (!) 109/91  Pulse: 79 79  Resp: 20 20  Temp:  (!) 36.1 C  SpO2: 100% 100%    Last Pain:  Vitals:   07/29/22 0820  TempSrc: Temporal  PainSc: 0-No pain                 Cleda Mccreedy Lydell Moga

## 2022-07-29 NOTE — OR Nursing (Signed)
Cold snare descending colon polyp not retrieved

## 2022-07-29 NOTE — H&P (Signed)
Wyline Mood, MD [address], [place], [place], [address] [address], [place], [place], [address] Phone: [phone]  Fax: [phone]  Primary Care Physician:  Marcine Matar, MD   Pre-Procedure History & Physical: HPI:  [name] is a 62 y.o. female is here for an colonoscopy.   Past Medical History:  Diagnosis Date   Anemia    during pregnancy   Anxiety    Asthma    Bronchitis age 77   Cancer (HCC)    left breast ca   COPD (chronic obstructive pulmonary disease) (HCC)    Depression    Hx of bladder infections    been over 5 years since last one    Malignant neoplasm of upper-outer quadrant of left breast in female, estrogen receptor positive (HCC)    Rheumatoid arthritis (HCC)    unc rheum    Tobacco abuse    Trouble in sleeping     Past Surgical History:  Procedure Laterality Date   BREAST LUMPECTOMY WITH RADIOACTIVE SEED AND SENTINEL LYMPH NODE BIOPSY Left 08/30/2019   Procedure: LEFT BREAST LUMPECTOMY X 2 WITH RADIOACTIVE SEED AND SENTINEL LYMPH NODE BIOPSY;  Surgeon: Griselda Miner, MD;  [facility];  Service: General;  Laterality: Left;  PEC BLOCK   COLONOSCOPY WITH PROPOFOL N/A 03/15/2019   Procedure: COLONOSCOPY WITH PROPOFOL;  Surgeon: Wyline Mood, MD;  Location: [facility] ENDOSCOPY;  Service: Gastroenterology;  Laterality: N/A;   HIP ARTHROPLASTY     JOINT REPLACEMENT     MULTIPLE TOOTH EXTRACTIONS     RE-EXCISION OF BREAST LUMPECTOMY Left 10/07/2019   Procedure: RE-EXCISION OF LEFT BREAST INFERIOR MARGIN;  Surgeon: Griselda Miner, MD;  Location: [facility];  Service: General;  Laterality: Left;   TOTAL HIP ARTHROPLASTY Left 02/15/2018   Procedure: TOTAL HIP ARTHROPLASTY ANTERIOR APPROACH;  Surgeon: Gean Birchwood, MD;  Location: [facility];  Service: Orthopedics;  Laterality: Left;   WISDOM TOOTH EXTRACTION      Prior to Admission medications   Medication Sig Start Date End Date Taking? Authorizing Provider  albuterol  (PROAIR HFA) 108 (90 Base) MCG/ACT inhaler Inhale 2 puffs into the lungs every 6 (six) hours as needed for wheezing or shortness of breath. 10/16/21  Yes Marcine Matar, MD  Budeson-Glycopyrrol-Formoterol (BREZTRI AEROSPHERE) 160-9-4.8 MCG/ACT AERO Inhale 2 puffs into the lungs 2 (two) times daily. 02/28/22  Yes Nyoka Cowden, MD  Methotrexate, PF, (OTREXUP) 20 MG/0.4ML SOAJ Inject 20 mg into the skin once a week. 07/16/22  Yes Gearldine Bienenstock, PA-C  nicotine (NICODERM CQ - DOSED IN MG/24 HR) 7 mg/24hr patch Place 1 patch (7 mg total) onto the skin daily. 06/17/22  Yes Marcine Matar, MD  predniSONE (DELTASONE) 10 MG tablet Take 4 tablets each am x 2 days, 2 tablets each am x 2 days, 1 tablet each am x 2 days and stop 06/26/22  Yes Nyoka Cowden, MD  azithromycin (ZITHROMAX) 250 MG tablet Take 2 tablets (500 mg) on  Day 1,  followed by 1 tablet (250 mg) once daily on Days 2 through 5. 06/26/22   Nyoka Cowden, MD  Budeson-Glycopyrrol-Formoterol (BREZTRI AEROSPHERE) 160-9-4.8 MCG/ACT AERO Inhale 2 puffs into the lungs in the morning and at bedtime. 02/28/22   Nyoka Cowden, MD  Calcium Carb-Cholecalciferol (CALCIUM 1000 + D PO) Take by mouth.    [provider]  cyanocobalamin 100 MCG tablet Take 100 mcg by mouth daily.  [provider]  Etanercept (ENBREL MINI) 50 MG/ML SOCT INJECT 50 MG INTO THE SKIN ONCE A WEEK. 07/16/22 07/16/23  Pollyann Savoy, MD  folic acid (FOLVITE) 1 MG tablet Take 2 tablets (2 mg total) by mouth daily. 02/24/22   Gearldine Bienenstock, PA-C  hydrOXYzine (VISTARIL) 25 MG capsule Take 1 capsule (25 mg total) by mouth at bedtime. 06/17/22   Marcine Matar, MD  methotrexate (RHEUMATREX) 2.5 MG tablet Take 8 tablets (20 mg total) by mouth once a week. Caution:Chemotherapy. Protect from light. 10/09/21   Pollyann Savoy, MD  Multiple Vitamin (MULTIVITAMIN WITH MINERALS) TABS tablet Take 1 tablet by mouth daily.    [provider]  tamoxifen (NOLVADEX)  20 MG tablet Take 1 tablet (20 mg total) by mouth daily. 01/03/22   Earna Coder, MD    Allergies as of 06/26/2022 - Review Complete 06/17/2022  Allergen Reaction Noted   Trazodone and nefazodone Other (See Comments) 06/17/2022   Other Rash 10/07/2019    Family History  Problem Relation Age of Onset   Breast cancer Mother 68       metastatic   Cervical cancer Mother    Lung cancer Brother        maternal half brother; d. 68s   Lung cancer Maternal Uncle        d. 57s   Lung cancer Maternal Uncle        d. 27   Cancer Maternal Grandmother        unknown type; dx 38s   Healthy Son    Breast cancer Maternal Great-grandmother        dx unknown age    Social History   Socioeconomic History   Marital status: Single    Spouse name: Not on file   Number of children: 1   Years of education: 14 years   Highest education level: Some college, no degree  Occupational History   Not on file  Tobacco Use   Smoking status: Former    Packs/day: 1.00    Years: 40.00    Additional pack years: 0.00    Total pack years: 40.00    Types: Cigarettes    Quit date: 03/10/2021    Years since quitting: 1.3    Passive exposure: Past   Smokeless tobacco: Never   Tobacco comments:    on nicotine patches  Vaping Use   Vaping Use: Never used  Substance and Sexual Activity   Alcohol use: Yes    Comment: Occassionally   Drug use: Not Currently    Types: Cocaine    Comment: "it's been years ago"   Sexual activity: Not Currently    Birth control/protection: Post-menopausal  Other Topics Concern   Not on file  Social History Narrative   Not on file   Social Determinants of Health   Financial Resource Strain: Medium Risk (05/27/2021)   Overall Financial Resource Strain (CARDIA)    Difficulty of Paying Living Expenses: Somewhat hard  Food Insecurity: No Food Insecurity (08/09/2021)   Hunger Vital Sign    Worried About Running Out of Food in the Last Year: Never true    Ran Out of  Food in the Last Year: Never true  Transportation Needs: No Transportation Needs (08/09/2021)   PRAPARE - Administrator, Civil Service (Medical): No    Lack of Transportation (Non-Medical): No  Recent Concern: Transportation Needs - Unmet Transportation Needs (05/27/2021)   PRAPARE - Transportation    Lack of  Transportation (Medical): Yes    Lack of Transportation (Non-Medical): Yes  Physical Activity: Insufficiently Active (08/09/2021)   Exercise Vital Sign    Days of Exercise per Week: 4 days    Minutes of Exercise per Session: 30 min  Stress: Stress Concern Present (05/27/2021)   [employer] of Occupational Health - Occupational Stress Questionnaire    Feeling of Stress : To some extent  Social Connections: Socially Isolated (05/27/2021)   Social Connection and Isolation Panel [NHANES]    Frequency of Communication with Friends and Family: Once a week    Frequency of Social Gatherings with Friends and Family: Twice a week    Attends Religious Services: Never    Database administrator or Organizations: No    Attends Banker Meetings: Never    Marital Status: Divorced  Catering manager Violence: Not At Risk (08/09/2021)   Humiliation, Afraid, Rape, and Kick questionnaire    Fear of Current or Ex-Partner: No    Emotionally Abused: No    Physically Abused: No    Sexually Abused: No    Review of Systems: See HPI, otherwise negative ROS  Physical Exam: BP 107/76   Pulse 81   Temp (!) 97 F (36.1 C) (Temporal)   Resp 20   Wt 51.1 kg   SpO2 96%   BMI 18.74 kg/m  General:   Alert,  pleasant and cooperative in NAD Head:  Normocephalic and atraumatic. Neck:  Supple; no masses or thyromegaly. Lungs:  Clear throughout to auscultation, normal respiratory effort.    Heart:  +S1, +S2, Regular rate and rhythm, No edema. Abdomen:  Soft, nontender and nondistended. Normal bowel sounds, without guarding, and without rebound.   Neurologic:  Alert and  oriented  x4;  grossly normal neurologically.  Impression/Plan: [name] is here for an colonoscopy to be performed for surveillance due to prior history of colon polyps   Risks, benefits, limitations, and alternatives regarding  colonoscopy have been reviewed with the patient.  Questions have been answered.  All parties agreeable.   Wyline Mood, MD  07/29/2022, 7:40 AM

## 2022-07-29 NOTE — Anesthesia Preprocedure Evaluation (Signed)
Anesthesia Evaluation  Patient identified by MRN, date of birth, ID band Patient awake    Reviewed: Allergy & Precautions, NPO status , Patient's Chart, lab work & pertinent test results  History of Anesthesia Complications Negative for: history of anesthetic complications  Airway Mallampati: III  TM Distance: <3 FB Neck ROM: full    Dental  (+) Chipped, Missing, Poor Dentition, Upper Dentures   Pulmonary neg shortness of breath, asthma , COPD, Patient abstained from smoking., former smoker   Pulmonary exam normal        Cardiovascular Exercise Tolerance: Good (-) angina negative cardio ROS Normal cardiovascular exam     Neuro/Psych  PSYCHIATRIC DISORDERS      negative neurological ROS     GI/Hepatic negative GI ROS, Neg liver ROS,neg GERD  ,,  Endo/Other  negative endocrine ROS    Renal/GU negative Renal ROS  negative genitourinary   Musculoskeletal   Abdominal   Peds  Hematology negative hematology ROS (+)   Anesthesia Other Findings Past Medical History: No date: Anemia     Comment:  during pregnancy No date: Anxiety No date: Asthma age 62: Bronchitis No date: Cancer [facility])     Comment:  left breast ca No date: COPD (chronic obstructive pulmonary disease) (HCC) No date: Depression No date: Hx of bladder infections     Comment:  been over 5 years since last one  No date: Malignant neoplasm of upper-outer quadrant of left breast in  female, estrogen receptor positive (HCC) No date: Rheumatoid arthritis (HCC)     Comment:  unc rheum  No date: Tobacco abuse No date: Trouble in sleeping  Past Surgical History: 08/30/2019: BREAST LUMPECTOMY WITH RADIOACTIVE SEED AND SENTINEL LYMPH  NODE BIOPSY; Left     Comment:  Procedure: LEFT BREAST LUMPECTOMY X 2 WITH RADIOACTIVE               SEED AND SENTINEL LYMPH NODE BIOPSY;  Surgeon: Griselda Miner, MD;  Location: [facility];  Service: General;                 Laterality: Left;  PEC BLOCK 03/15/2019: COLONOSCOPY WITH PROPOFOL; N/A     Comment:  Procedure: COLONOSCOPY WITH PROPOFOL;  Surgeon: Wyline Mood, MD;  Location: [facility] ENDOSCOPY;  Service:               Gastroenterology;  Laterality: N/A; No date: HIP ARTHROPLASTY No date: JOINT REPLACEMENT No date: MULTIPLE TOOTH EXTRACTIONS 10/07/2019: RE-EXCISION OF BREAST LUMPECTOMY; Left     Comment:  Procedure: RE-EXCISION OF LEFT BREAST INFERIOR MARGIN;                Surgeon: Griselda Miner, MD;  Location: [facility];  Service: General;  Laterality: Left; 02/15/2018: TOTAL HIP ARTHROPLASTY; Left     Comment:  Procedure: TOTAL HIP ARTHROPLASTY ANTERIOR APPROACH;                Surgeon: Gean Birchwood, MD;  Location: [facility];  Service:               Orthopedics;  Laterality: Left; No date: WISDOM TOOTH EXTRACTION     Reproductive/Obstetrics negative OB ROS  Anesthesia Physical Anesthesia Plan  ASA: 3  Anesthesia Plan: General   Post-op Pain Management:    Induction: Intravenous  PONV Risk Score and Plan: Propofol infusion and TIVA  Airway Management Planned: Natural Airway and Nasal Cannula  Additional Equipment:   Intra-op Plan:   Post-operative Plan:   Informed Consent: I have reviewed the patients History and Physical, chart, labs and discussed the procedure including the risks, benefits and alternatives for the proposed anesthesia with the patient or authorized representative who has indicated his/her understanding and acceptance.     Dental Advisory Given  Plan Discussed with: Anesthesiologist, CRNA and Surgeon  Anesthesia Plan Comments: (Patient consented for risks of anesthesia including but not limited to:  - adverse reactions to medications - risk of airway placement if required - damage to eyes, teeth, lips or other oral mucosa - nerve damage due to positioning  - sore  throat or hoarseness - Damage to heart, brain, nerves, lungs, other parts of body or loss of life  Patient voiced understanding.)       Anesthesia Quick Evaluation

## 2022-07-29 NOTE — Transfer of Care (Signed)
Immediate Anesthesia Transfer of Care Note  Patient: [name]  Procedure(s) Performed: COLONOSCOPY WITH PROPOFOL  Patient Location: PACU and Endoscopy Unit  Anesthesia Type:General  Level of Consciousness: awake, alert , and oriented  Airway & Oxygen Therapy: Patient Spontanous Breathing  Post-op Assessment: Report given to RN and Post -op Vital signs reviewed and stable  Post vital signs: Reviewed and stable  Last Vitals:  Vitals Value Taken Time  BP 82/52 07/29/22 0800  Temp    Pulse 100 07/29/22 0800  Resp 21 07/29/22 0800  SpO2 97 % 07/29/22 0800    Last Pain:  Vitals:   07/29/22 0800  TempSrc:   PainSc: 0-No pain         Complications: No notable events documented.

## 2022-07-30 ENCOUNTER — Encounter: Payer: Self-pay | Admitting: Gastroenterology

## 2022-07-30 NOTE — Progress Notes (Deleted)
Office Visit Note  Patient: [name]             Date of Birth: March 29, 1960           MRN: [id number]             PCP: Marcine Matar, MD Referring: Marcine Matar, MD Visit Date: 08/13/2022 Occupation: @GUAROCC @  Subjective:    History of Present Illness: [name] is a 62 y.o. female with history of seropositive rheumatoid arthritis and osteoarthritis.  Patient remains on Enbrel 50 mg sq injections once weekly, Otrexup 20 mg sq injections once weekly, folic acid 2 mg daily.   CBC and CMP updated on 06/17/22. Her next lab work will be due in July and every 3 months. TB gold negative on 08/19/21.   Discussed the importance of holding enbrel and otrexup if she develops signs or symptoms of an infection and to resume once the infection has completely cleared.   Activities of Daily Living:  Patient reports morning stiffness for *** {minute/hour:19697}.   Patient {ACTIONS;DENIES/REPORTS:21021675::"Denies"} nocturnal pain.  Difficulty dressing/grooming: {ACTIONS;DENIES/REPORTS:21021675::"Denies"} Difficulty climbing stairs: {ACTIONS;DENIES/REPORTS:21021675::"Denies"} Difficulty getting out of chair: {ACTIONS;DENIES/REPORTS:21021675::"Denies"} Difficulty using hands for taps, buttons, cutlery, and/or writing: {ACTIONS;DENIES/REPORTS:21021675::"Denies"}  No Rheumatology ROS completed.   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   Adenomatous colon polyp 03/16/2019   Degenerative joint disease of shoulder region 06/08/2018   Rheumatoid arthritis involving both hands with positive rheumatoid  factor (HCC) 06/08/2018   Rheumatoid arthritis involving both feet with positive rheumatoid factor (HCC) 06/08/2018   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   Status post total hip replacement, 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   Smoker 06/08/2018   Anxiety and depression 06/08/2018   Family history of breast cancer 03/25/2018   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   Rheumatoid arthritis involving multiple sites (HCC) 01/15/2018   Tobacco dependence 01/15/2018   Opioid use agreement exists 01/15/2018   Positive depression screening 01/15/2018   Homeless 01/15/2018    Past Medical History:  Diagnosis Date   Anemia    during pregnancy   Anxiety    Asthma    Bronchitis age 59   Cancer (HCC)    left breast ca   COPD (chronic obstructive pulmonary disease) (HCC)    Depression    Hx of bladder infections    been over 5 years since last one    Malignant neoplasm of upper-outer quadrant of left breast in female, estrogen receptor positive (HCC)    Rheumatoid arthritis (HCC)    unc rheum    Tobacco abuse    Trouble in sleeping     Family History  Problem Relation Age of Onset   Breast cancer Mother 22       metastatic   Cervical cancer Mother    Lung cancer Brother        maternal half brother; d. 76s   Lung cancer Maternal Uncle        d. 32s   Lung cancer Maternal Uncle        d. 67   Cancer Maternal Grandmother  unknown type; dx 72s   Healthy Son    Breast cancer Maternal Great-grandmother        dx unknown age   Past Surgical History:  Procedure Laterality Date   BREAST LUMPECTOMY WITH RADIOACTIVE SEED AND SENTINEL LYMPH NODE BIOPSY Left 08/30/2019   Procedure: LEFT BREAST LUMPECTOMY X 2 WITH RADIOACTIVE SEED AND SENTINEL LYMPH NODE BIOPSY;  Surgeon: Griselda Miner, MD;  [facility]: [facility];  Service: General;  Laterality: Left;  PEC BLOCK   COLONOSCOPY WITH PROPOFOL N/A 03/15/2019   Procedure: COLONOSCOPY WITH PROPOFOL;  Surgeon: Wyline Mood, MD;  Location: [facility] ENDOSCOPY;  Service:  Gastroenterology;  Laterality: N/A;   HIP ARTHROPLASTY     JOINT REPLACEMENT     MULTIPLE TOOTH EXTRACTIONS     RE-EXCISION OF BREAST LUMPECTOMY Left 10/07/2019   Procedure: RE-EXCISION OF LEFT BREAST INFERIOR MARGIN;  Surgeon: Griselda Miner, MD;  Location: [facility];  Service: General;  Laterality: Left;   TOTAL HIP ARTHROPLASTY Left 02/15/2018   Procedure: TOTAL HIP ARTHROPLASTY ANTERIOR APPROACH;  Surgeon: Gean Birchwood, MD;  Location: [facility];  Service: Orthopedics;  Laterality: Left;   WISDOM TOOTH EXTRACTION     Social History   Social History Narrative   Not on file   Immunization History  Administered Date(s) Administered   Influenza,inj,Quad PF,6+ Mos 12/18/2020, 03/17/2022   PFIZER(Purple Top)SARS-COV-2 Vaccination 06/27/2019, 07/19/2019   Pneumococcal Conjugate-13 07/14/2018   Tdap 03/25/2018   Zoster Recombinat (Shingrix) 07/14/2018, 06/17/2022     Objective: Vital Signs: There were no vitals taken for this visit.   Physical Exam Vitals and nursing note reviewed.  Constitutional:      Appearance: She is well-developed.  HENT:     Head: Normocephalic and atraumatic.  Eyes:     Conjunctiva/sclera: Conjunctivae normal.  Cardiovascular:     Rate and Rhythm: Normal rate and regular rhythm.     Heart sounds: Normal heart sounds.  Pulmonary:     Effort: Pulmonary effort is normal.     Breath sounds: Normal breath sounds.  Abdominal:     General: Bowel sounds are normal.     Palpations: Abdomen is soft.  Musculoskeletal:     Cervical back: Normal range of motion.  Lymphadenopathy:     Cervical: No cervical adenopathy.  Skin:    General: Skin is warm and dry.     Capillary Refill: Capillary refill takes less than 2 seconds.  Neurological:     Mental Status: She is alert and oriented to person, place, and time.  Psychiatric:        Behavior: Behavior normal.      Musculoskeletal Exam: ***  CDAI Exam: CDAI Score: -- Patient Global: --;  Provider Global: -- Swollen: --; Tender: -- Joint Exam 08/13/2022   No joint exam has been documented for this visit   There is currently no information documented on the homunculus. Go to the Rheumatology activity and complete the homunculus joint exam.  Investigation: No additional findings.  Imaging: No results found.  Recent Labs: Lab Results  Component Value Date   WBC 5.4 06/17/2022   HGB 13.9 06/17/2022   PLT 196 06/17/2022   NA 143 06/17/2022   K 4.7 06/17/2022   CL 105 06/17/2022   CO2 28 06/17/2022   GLUCOSE 97 06/17/2022   BUN 19 06/17/2022   CREATININE 0.91 06/17/2022   BILITOT 0.4 06/17/2022   ALKPHOS 66 12/14/2020   AST 18 06/17/2022   ALT 15 06/17/2022   PROT  6.4 06/17/2022   ALBUMIN 4.4 12/14/2020   CALCIUM 9.5 06/17/2022   GFRAA 101 07/30/2020   QFTBGOLDPLUS NEGATIVE 08/19/2021    Speciality Comments: No specialty comments available.  Procedures:  No procedures performed Allergies: Trazodone and nefazodone and Other   Assessment / Plan:     Visit Diagnoses: Rheumatoid arthritis involving multiple sites with positive rheumatoid factor (HCC)  High risk medication use  Contracture of left elbow  Other secondary osteoarthritis of both shoulders  Status post total hip replacement, left  [name]  Other emphysema (HCC)  Solitary pulmonary nodule on lung CT  Smoker  Anxiety and depression  Family history of breast cancer  Orders: No orders of the defined types were placed in this encounter.  No orders of the defined types were placed in this encounter.   Face-to-face time spent with patient was *** minutes. Greater than 50% of time was spent in counseling and coordination of care.  Follow-Up Instructions: No follow-ups on file.   Gearldine Bienenstock, PA-C  Note - This record has been created using Dragon software.  Chart creation errors have been sought, but may not always  have been located.  Such creation errors do not reflect on  the standard of medical care.

## 2022-08-06 ENCOUNTER — Other Ambulatory Visit (HOSPITAL_COMMUNITY): Payer: Self-pay

## 2022-08-12 ENCOUNTER — Other Ambulatory Visit (HOSPITAL_COMMUNITY): Payer: Self-pay

## 2022-08-13 ENCOUNTER — Ambulatory Visit: Payer: Self-pay | Admitting: Physician Assistant

## 2022-08-13 DIAGNOSIS — R911 Solitary pulmonary nodule: Secondary | ICD-10-CM

## 2022-08-13 DIAGNOSIS — F17200 Nicotine dependence, unspecified, uncomplicated: Secondary | ICD-10-CM

## 2022-08-13 DIAGNOSIS — M0579 Rheumatoid arthritis with rheumatoid factor of multiple sites without organ or systems involvement: Secondary | ICD-10-CM

## 2022-08-13 DIAGNOSIS — D0512 Intraductal carcinoma in situ of left breast: Secondary | ICD-10-CM

## 2022-08-13 DIAGNOSIS — Z803 Family history of malignant neoplasm of breast: Secondary | ICD-10-CM

## 2022-08-13 DIAGNOSIS — F419 Anxiety disorder, unspecified: Secondary | ICD-10-CM

## 2022-08-13 DIAGNOSIS — Z96642 Presence of left artificial hip joint: Secondary | ICD-10-CM

## 2022-08-13 DIAGNOSIS — J438 Other emphysema: Secondary | ICD-10-CM

## 2022-08-13 DIAGNOSIS — M19211 Secondary osteoarthritis, right shoulder: Secondary | ICD-10-CM

## 2022-08-13 DIAGNOSIS — Z79899 Other long term (current) drug therapy: Secondary | ICD-10-CM

## 2022-08-13 DIAGNOSIS — M24522 Contracture, left elbow: Secondary | ICD-10-CM

## 2022-08-14 ENCOUNTER — Other Ambulatory Visit: Payer: No Typology Code available for payment source

## 2022-08-19 NOTE — Progress Notes (Deleted)
Subjective:   [name] is a 62 y.o. female who presents for Medicare Annual (Subsequent) preventive examination.  Review of Systems    ***       Objective:    There were no vitals filed for this visit. There is no height or weight on file to calculate BMI.     07/07/2022    3:23 PM 01/03/2022    1:53 PM 08/23/2021    9:53 AM 08/09/2021   10:35 AM 07/12/2021   11:10 AM 12/14/2020    9:50 PM 11/21/2019    8:42 AM  Advanced Directives  Does Patient Have a Medical Advance Directive? No No No No No No No  Would patient like information on creating a medical advance directive? No - Patient declined No - Patient declined No - Patient declined  No - Patient declined  No - Patient declined    Current Medications (verified) Outpatient Encounter Medications as of 08/20/2022  Medication Sig   albuterol (PROAIR HFA) 108 (90 Base) MCG/ACT inhaler Inhale 2 puffs into the lungs every 6 (six) hours as needed for wheezing or shortness of breath.   azithromycin (ZITHROMAX) 250 MG tablet Take 2 tablets (500 mg) on  Day 1,  followed by 1 tablet (250 mg) once daily on Days 2 through 5.   Budeson-Glycopyrrol-Formoterol (BREZTRI AEROSPHERE) 160-9-4.8 MCG/ACT AERO Inhale 2 puffs into the lungs 2 (two) times daily.   Budeson-Glycopyrrol-Formoterol (BREZTRI AEROSPHERE) 160-9-4.8 MCG/ACT AERO Inhale 2 puffs into the lungs in the morning and at bedtime.   Calcium Carb-Cholecalciferol (CALCIUM 1000 + D PO) Take by mouth.   cyanocobalamin 100 MCG tablet Take 100 mcg by mouth daily.   Etanercept (ENBREL MINI) 50 MG/ML SOCT INJECT 50 MG INTO THE SKIN ONCE A WEEK.   folic acid (FOLVITE) 1 MG tablet Take 2 tablets (2 mg total) by mouth daily.   hydrOXYzine (VISTARIL) 25 MG capsule Take 1 capsule (25 mg total) by mouth at bedtime.   methotrexate (RHEUMATREX) 2.5 MG tablet Take 8 tablets (20 mg total) by mouth once a week. Caution:Chemotherapy. Protect from light.   Methotrexate, PF, (OTREXUP) 20 MG/0.4ML SOAJ  Inject 20 mg into the skin once a week.   Multiple Vitamin (MULTIVITAMIN WITH MINERALS) TABS tablet Take 1 tablet by mouth daily.   nicotine (NICODERM CQ - DOSED IN MG/24 HR) 7 mg/24hr patch Place 1 patch (7 mg total) onto the skin daily.   predniSONE (DELTASONE) 10 MG tablet Take 4 tablets each am x 2 days, 2 tablets each am x 2 days, 1 tablet each am x 2 days and stop   tamoxifen (NOLVADEX) 20 MG tablet Take 1 tablet (20 mg total) by mouth daily.   No facility-administered encounter medications on file as of 08/20/2022.    Allergies (verified) Trazodone and nefazodone and Other   History: Past Medical History:  Diagnosis Date   Anemia    during pregnancy   Anxiety    Asthma    Bronchitis age 25   Cancer (HCC)    left breast ca   COPD (chronic obstructive pulmonary disease) (HCC)    Depression    Hx of bladder infections    been over 5 years since last one    Malignant neoplasm of upper-outer quadrant of left breast in female, estrogen receptor positive (HCC)    Rheumatoid arthritis (HCC)    unc rheum    Tobacco abuse    Trouble in sleeping    Past Surgical History:  Procedure  Laterality Date   BREAST LUMPECTOMY WITH RADIOACTIVE SEED AND SENTINEL LYMPH NODE BIOPSY Left 08/30/2019   Procedure: LEFT BREAST LUMPECTOMY X 2 WITH RADIOACTIVE SEED AND SENTINEL LYMPH NODE BIOPSY;  Surgeon: Griselda Miner, MD;  Location: [facility] OR;  Service: General;  Laterality: Left;  PEC BLOCK   COLONOSCOPY WITH PROPOFOL N/A 03/15/2019   Procedure: COLONOSCOPY WITH PROPOFOL;  Surgeon: Wyline Mood, MD;  Location: [facility] ENDOSCOPY;  Service: Gastroenterology;  Laterality: N/A;   COLONOSCOPY WITH PROPOFOL N/A 07/29/2022   Procedure: COLONOSCOPY WITH PROPOFOL;  Surgeon: Wyline Mood, MD;  Location: [facility] ENDOSCOPY;  Service: Gastroenterology;  Laterality: N/A;   HIP ARTHROPLASTY     JOINT REPLACEMENT     MULTIPLE TOOTH EXTRACTIONS     RE-EXCISION OF BREAST LUMPECTOMY Left 10/07/2019   Procedure: RE-EXCISION OF  LEFT BREAST INFERIOR MARGIN;  Surgeon: Griselda Miner, MD;  Location: [facility];  Service: General;  Laterality: Left;   TOTAL HIP ARTHROPLASTY Left 02/15/2018   Procedure: TOTAL HIP ARTHROPLASTY ANTERIOR APPROACH;  Surgeon: Gean Birchwood, MD;  Location: [facility] ORS;  Service: Orthopedics;  Laterality: Left;   WISDOM TOOTH EXTRACTION     Family History  Problem Relation Age of Onset   Breast cancer Mother 1       metastatic   Cervical cancer Mother    Lung cancer Brother        maternal half brother; d. 26s   Lung cancer Maternal Uncle        d. 47s   Lung cancer Maternal Uncle        d. 90   Cancer Maternal Grandmother        unknown type; dx 72s   Healthy Son    Breast cancer Maternal Great-grandmother        dx unknown age   Social History   Socioeconomic History   Marital status: Single    Spouse name: Not on file   Number of children: 1   Years of education: 14 years   Highest education level: Some college, no degree  Occupational History   Not on file  Tobacco Use   Smoking status: Former    Packs/day: 1.00    Years: 40.00    Additional pack years: 0.00    Total pack years: 40.00    Types: Cigarettes    Quit date: 03/10/2021    Years since quitting: 1.4    Passive exposure: Past   Smokeless tobacco: Never   Tobacco comments:    on nicotine patches  Vaping Use   Vaping Use: Never used  Substance and Sexual Activity   Alcohol use: Yes    Comment: Occassionally   Drug use: Not Currently    Types: Cocaine    Comment: "it's been years ago"   Sexual activity: Not Currently    Birth control/protection: Post-menopausal  Other Topics Concern   Not on file  Social History Narrative   Not on file   Social Determinants of Health   Financial Resource Strain: Medium Risk (05/27/2021)   Overall Financial Resource Strain (CARDIA)    Difficulty of Paying Living Expenses: Somewhat hard  Food Insecurity: No Food Insecurity (08/09/2021)   Hunger Vital Sign     Worried About Running Out of Food in the Last Year: Never true    Ran Out of Food in the Last Year: Never true  Transportation Needs: No Transportation Needs (08/09/2021)   PRAPARE - Administrator, Civil Service (Medical): No  Lack of Transportation (Non-Medical): No  Recent Concern: Transportation Needs - Unmet Transportation Needs (05/27/2021)   PRAPARE - Transportation    Lack of Transportation (Medical): Yes    Lack of Transportation (Non-Medical): Yes  Physical Activity: Insufficiently Active (08/09/2021)   Exercise Vital Sign    Days of Exercise per Week: 4 days    Minutes of Exercise per Session: 30 min  Stress: Stress Concern Present (05/27/2021)   [employer] of Occupational Health - Occupational Stress Questionnaire    Feeling of Stress : To some extent  Social Connections: Socially Isolated (05/27/2021)   Social Connection and Isolation Panel [NHANES]    Frequency of Communication with Friends and Family: Once a week    Frequency of Social Gatherings with Friends and Family: Twice a week    Attends Religious Services: Never    Database administrator or Organizations: No    Attends Engineer, structural: Never    Marital Status: Divorced    Tobacco Counseling Counseling given: Not Answered Tobacco comments: on nicotine patches   Clinical Intake:                 Diabetic?No          Activities of Daily Living     No data to display          Patient Care Team: Marcine Matar, MD as PCP - General (Internal Medicine) Griselda Miner, MD as Consulting Physician (General Surgery) Pollyann Savoy, MD as Consulting Physician (Rheumatology) Sallee Lange, LCSW (Inactive) as Social Worker (General Practice) Rachel Bo as Physician Assistant (Physician Assistant) Earna Coder, MD as Consulting Physician (Oncology)  Indicate any recent Medical Services you may have received from other than [facility]  providers in the past year (date may be approximate).     Assessment:   This is a routine wellness examination for [name].  Hearing/Vision screen No results found.  Dietary issues and exercise activities discussed:     Goals Addressed   None    Depression Screen    06/17/2022    2:39 PM 03/17/2022    2:02 PM 08/09/2021   10:32 AM 06/20/2021   10:50 AM 05/27/2021    9:29 AM 12/18/2020    2:25 PM 04/01/2019    2:45 PM  PHQ 2/9 Scores  PHQ - 2 Score 1 1 0 2 1 5  0  PHQ- 9 Score 5 9  10 3 20      Fall Risk    06/17/2022    1:43 PM 03/17/2022    2:02 PM 08/09/2021   10:36 AM 06/20/2021   10:50 AM 06/22/2018    3:08 PM  Fall Risk   Falls in the past year? 0 0 0 0 0  Number falls in past yr: 0 0 0 0   Injury with Fall? 0 0 0 0   Risk for fall due to : No Fall Risks No Fall Risks No Fall Risks No Fall Risks   Follow up   Falls evaluation completed      FALL RISK PREVENTION PERTAINING TO THE HOME:  Any stairs in or around the home? {YES/NO:21197} If so, are there any without handrails? {YES/NO:21197} Home free of loose throw rugs in walkways, pet beds, electrical cords, etc? {YES/NO:21197} Adequate lighting in your home to reduce risk of falls? {YES/NO:21197}  ASSISTIVE DEVICES UTILIZED TO PREVENT FALLS:  Life alert? {YES/NO:21197} Use of a cane, walker or w/c? {YES/NO:21197} Grab bars in the  bathroom? {YES/NO:21197} Shower chair or bench in shower? {YES/NO:21197} Elevated toilet seat or a handicapped toilet? {YES/NO:21197}  TIMED UP AND GO:  Was the test performed? No . Telephonic visit   Cognitive Function:        08/09/2021   10:37 AM  6CIT Screen  What Year? 0 points  What month? 0 points  What time? 0 points  Count back from 20 0 points  Months in reverse 0 points  Repeat phrase 0 points  Total Score 0 points    Immunizations Immunization History  Administered Date(s) Administered   Influenza,inj,Quad PF,6+ Mos 12/18/2020, 03/17/2022   PFIZER(Purple  Top)SARS-COV-2 Vaccination 06/27/2019, 07/19/2019   Pneumococcal Conjugate-13 07/14/2018   Tdap 03/25/2018   Zoster Recombinat (Shingrix) 07/14/2018, 06/17/2022    TDAP status: Up to date  Pneumococcal vaccine status: Up to date  Covid-19 vaccine status: Information provided on how to obtain vaccines.   Qualifies for Shingles Vaccine? Yes   Zostavax completed No   Shingrix Completed?: Yes  Screening Tests Health Maintenance  Topic Date Due   COVID-19 Vaccine (3 - Pfizer risk series) 08/16/2019   Medicare Annual Wellness (AWV)  08/10/2022   INFLUENZA VACCINE  10/09/2022   Lung Cancer Screening  01/22/2023   MAMMOGRAM  02/11/2024   PAP SMEAR-Modifier  07/19/2024   Colonoscopy  07/28/2025   DTaP/Tdap/Td (2 - Td or Tdap) 03/25/2028   Hepatitis C Screening  Completed   HIV Screening  Completed   Zoster Vaccines- Shingrix  Completed   HPV VACCINES  Aged Out    Health Maintenance  Health Maintenance Due  Topic Date Due   COVID-19 Vaccine (3 - Pfizer risk series) 08/16/2019   Medicare Annual Wellness (AWV)  08/10/2022    Colorectal cancer screening: Type of screening: Colonoscopy. Completed 07/29/22. Repeat every 3 years  Mammogram status: Completed 02/10/22. Repeat every year  Lung Cancer Screening: (Low Dose CT Chest recommended if Age 73-80 years, 30 pack-year currently smoking OR have quit w/in 15years.) does qualify.   Lung Cancer Screening Referral: 01/21/22  Additional Screening:  Hepatitis C Screening: does qualify; Completed 05/11/18  Vision Screening: Recommended annual ophthalmology exams for early detection of glaucoma and other disorders of the eye. Is the patient up to date with their annual eye exam?  {YES/NO:21197} Who is the provider or what is the name of the office in which the patient attends annual eye exams? *** If pt is not established with a provider, would they like to be referred to a provider to establish care? {YES/NO:21197}.   Dental  Screening: Recommended annual dental exams for proper oral hygiene  Community Resource Referral / Chronic Care Management: CRR required this visit?  {YES/NO:21197}  CCM required this visit?  {YES/NO:21197}     Plan:     I have personally reviewed and noted the following in the patient's chart:   Medical and social history Use of alcohol, tobacco or illicit drugs  Current medications and supplements including opioid prescriptions. {Opioid [phone]} Functional ability and status Nutritional status Physical activity Advanced directives List of other physicians Hospitalizations, surgeries, and ER visits in previous 12 months Vitals Screenings to include cognitive, depression, and falls Referrals and appointments  In addition, I have reviewed and discussed with patient certain preventive protocols, quality metrics, and best practice recommendations. A written personalized care plan for preventive services as well as general preventive health recommendations were provided to patient.     Durwin Nora, [place]   1/61/0960   Due to this  being a virtual visit, the after visit summary with patients personalized plan was offered to patient via mail or my-chart. ***Patient declined at this time./ Patient would like to access on my-chart/ per request, patient was mailed a copy of AVS./ Patient preferred to pick up at office at next visit  Nurse Notes: ***

## 2022-08-20 ENCOUNTER — Ambulatory Visit: Payer: No Typology Code available for payment source | Attending: Internal Medicine

## 2022-09-01 ENCOUNTER — Other Ambulatory Visit: Payer: Self-pay | Admitting: Internal Medicine

## 2022-09-01 ENCOUNTER — Ambulatory Visit
Admission: RE | Admit: 2022-09-01 | Discharge: 2022-09-01 | Disposition: A | Discharge status: Home / Self Care | Payer: No Typology Code available for payment source | Source: Ambulatory Visit | Attending: Internal Medicine | Admitting: Internal Medicine

## 2022-09-01 DIAGNOSIS — Z170 Estrogen receptor positive status [ER+]: Secondary | ICD-10-CM | POA: Insufficient documentation

## 2022-09-01 DIAGNOSIS — C50412 Malignant neoplasm of upper-outer quadrant of left female breast: Secondary | ICD-10-CM | POA: Diagnosis not present

## 2022-09-01 DIAGNOSIS — R590 Localized enlarged lymph nodes: Secondary | ICD-10-CM | POA: Diagnosis not present

## 2022-09-01 NOTE — Patient Instructions (Incomplete)
[name] , Thank you for taking time to come for your Medicare Wellness Visit. I appreciate your ongoing commitment to your health goals. Please review the following plan we discussed and let me know if I can assist you in the future.   These are the goals we discussed:  Goals   None     This is a list of the screening recommended for you and due dates:  Health Maintenance  Topic Date Due   COVID-19 Vaccine (3 - Pfizer risk series) 08/16/2019   Medicare Annual Wellness Visit  08/10/2022   Flu Shot  10/09/2022   Screening for Lung Cancer  01/22/2023   Mammogram  02/11/2024   Pap Smear  07/19/2024   Colon Cancer Screening  07/28/2025   DTaP/Tdap/Td vaccine (2 - Td or Tdap) 03/25/2028   Hepatitis C Screening  Completed   HIV Screening  Completed   Zoster (Shingles) Vaccine  Completed   HPV Vaccine  Aged Out    Advanced directives: Information on Advanced Care Planning can be found at [place] Secretary of State Advance Health Care Directives Advance Health Care Directives ([place]os[place].gov) Please bring a copy of your health care power of attorney and living will to the office to be added to your chart at your convenience.   Conditions/risks identified: Aim for 30 minutes of exercise or brisk walking, 6-8 glasses of water, and 5 servings of fruits and vegetables each day.  Next appointment: Follow up in one year for your annual wellness visit.   Preventive Care 40-64 Years, Female Preventive care refers to lifestyle choices and visits with your health care provider that can promote health and wellness. What does preventive care include? A yearly physical exam. This is also called an annual well check. Dental exams once or twice a year. Routine eye exams. Ask your health care provider how often you should have your eyes checked. Personal lifestyle choices, including: Daily care of your teeth and gums. Regular physical activity. Eating a healthy diet. Avoiding tobacco and drug  use. Limiting alcohol use. Practicing safe sex. Taking low-dose aspirin daily starting at age 50. Taking vitamin and mineral supplements as recommended by your health care provider. What happens during an annual well check? The services and screenings done by your health care provider during your annual well check will depend on your age, overall health, lifestyle risk factors, and family history of disease. Counseling  Your health care provider may ask you questions about your: Alcohol use. Tobacco use. Drug use. Emotional well-being. Home and relationship well-being. Sexual activity. Eating habits. Work and work environment. Method of birth control. Menstrual cycle. Pregnancy history. Screening  You may have the following tests or measurements: Height, weight, and BMI. Blood pressure. Lipid and cholesterol levels. These may be checked every 5 years, or more frequently if you are over 50 years old. Skin check. Lung cancer screening. You may have this screening every year starting at age 55 if you have a 30-pack-year history of smoking and currently smoke or have quit within the past 15 years. Fecal occult blood test (FOBT) of the stool. You may have this test every year starting at age 50. Flexible sigmoidoscopy or colonoscopy. You may have a sigmoidoscopy every 5 years or a colonoscopy every 10 years starting at age 50. Hepatitis C blood test. Hepatitis B blood test. Sexually transmitted disease (STD) testing. Diabetes screening. This is done by checking your blood sugar (glucose) after you have not eaten for a while (fasting). You   may have this done every 1-3 years. Mammogram. This may be done every 1-2 years. Talk to your health care provider about when you should start having regular mammograms. This may depend on whether you have a family history of breast cancer. BRCA-related cancer screening. This may be done if you have a family history of breast, ovarian, tubal, or  peritoneal cancers. Pelvic exam and Pap test. This may be done every 3 years starting at age 21. Starting at age 30, this may be done every 5 years if you have a Pap test in combination with an HPV test. Bone density scan. This is done to screen for osteoporosis. You may have this scan if you are at high risk for osteoporosis. Discuss your test results, treatment options, and if necessary, the need for more tests with your health care provider. Vaccines  Your health care provider may recommend certain vaccines, such as: Influenza vaccine. This is recommended every year. Tetanus, diphtheria, and acellular pertussis (Tdap, Td) vaccine. You may need a Td booster every 10 years. Zoster vaccine. You may need this after age 60. Pneumococcal 13-valent conjugate (PCV13) vaccine. You may need this if you have certain conditions and were not previously vaccinated. Pneumococcal polysaccharide (PPSV23) vaccine. You may need one or two doses if you smoke cigarettes or if you have certain conditions. Talk to your health care provider about which screenings and vaccines you need and how often you need them. This information is not intended to replace advice given to you by your health care provider. Make sure you discuss any questions you have with your health care provider. Document Released: 03/23/2015 Document Revised: 11/14/2015 Document Reviewed: 12/26/2014 Elsevier Interactive Patient Education  2017 Elsevier Inc.    Fall Prevention in the Home Falls can cause injuries. They can happen to people of all ages. There are many things you can do to make your home safe and to help prevent falls. What can I do on the outside of my home? Regularly fix the edges of walkways and driveways and fix any cracks. Remove anything that might make you trip as you walk through a door, such as a raised step or threshold. Trim any bushes or trees on the path to your home. Use bright outdoor lighting. Clear any walking  paths of anything that might make someone trip, such as rocks or tools. Regularly check to see if handrails are loose or broken. Make sure that both sides of any steps have handrails. Any raised decks and porches should have guardrails on the edges. Have any leaves, snow, or ice cleared regularly. Use sand or salt on walking paths during winter. Clean up any spills in your garage right away. This includes oil or grease spills. What can I do in the bathroom? Use night lights. Install grab bars by the toilet and in the tub and shower. Do not use towel bars as grab bars. Use non-skid mats or decals in the tub or shower. If you need to sit down in the shower, use a plastic, non-slip stool. Keep the floor dry. Clean up any water that spills on the floor as soon as it happens. Remove soap buildup in the tub or shower regularly. Attach bath mats securely with double-sided non-slip rug tape. Do not have throw rugs and other things on the floor that can make you trip. What can I do in the bedroom? Use night lights. Make sure that you have a light by your bed that is easy to reach. Do   not use any sheets or blankets that are too big for your bed. They should not hang down onto the floor. Have a firm chair that has side arms. You can use this for support while you get dressed. Do not have throw rugs and other things on the floor that can make you trip. What can I do in the kitchen? Clean up any spills right away. Avoid walking on wet floors. Keep items that you use a lot in easy-to-reach places. If you need to reach something above you, use a strong step stool that has a grab bar. Keep electrical cords out of the way. Do not use floor polish or wax that makes floors slippery. If you must use wax, use non-skid floor wax. Do not have throw rugs and other things on the floor that can make you trip. What can I do with my stairs? Do not leave any items on the stairs. Make sure that there are handrails  on both sides of the stairs and use them. Fix handrails that are broken or loose. Make sure that handrails are as long as the stairways. Check any carpeting to make sure that it is firmly attached to the stairs. Fix any carpet that is loose or worn. Avoid having throw rugs at the top or bottom of the stairs. If you do have throw rugs, attach them to the floor with carpet tape. Make sure that you have a light switch at the top of the stairs and the bottom of the stairs. If you do not have them, ask someone to add them for you. What else can I do to help prevent falls? Wear shoes that: Do not have high heels. Have rubber bottoms. Are comfortable and fit you well. Are closed at the toe. Do not wear sandals. If you use a stepladder: Make sure that it is fully opened. Do not climb a closed stepladder. Make sure that both sides of the stepladder are locked into place. Ask someone to hold it for you, if possible. Clearly mark and make sure that you can see: Any grab bars or handrails. First and last steps. Where the edge of each step is. Use tools that help you move around (mobility aids) if they are needed. These include: Canes. Walkers. Scooters. Crutches. Turn on the lights when you go into a dark area. Replace any light bulbs as soon as they burn out. Set up your furniture so you have a clear path. Avoid moving your furniture around. If any of your floors are uneven, fix them. If there are any pets around you, be aware of where they are. Review your medicines with your doctor. Some medicines can make you feel dizzy. This can increase your chance of falling. Ask your doctor what other things that you can do to help prevent falls. This information is not intended to replace advice given to you by your health care provider. Make sure you discuss any questions you have with your health care provider. Document Released: 12/21/2008 Document Revised: 08/02/2015 Document Reviewed:  03/31/2014 Elsevier Interactive Patient Education  2017 Elsevier Inc.  

## 2022-09-01 NOTE — Progress Notes (Unsigned)
Subjective:   [name] is a 62 y.o. female who presents for Medicare Annual (Subsequent) preventive examination.  Visit Complete: {VISITMETHOD:[phone]}  Patient Medicare AWV questionnaire was completed by the patient on ***; I have confirmed that all information answered by patient is correct and no changes since this date.  Review of Systems    ***       Objective:    There were no vitals filed for this visit. There is no height or weight on file to calculate BMI.     07/07/2022    3:23 PM 01/03/2022    1:53 PM 08/23/2021    9:53 AM 08/09/2021   10:35 AM 07/12/2021   11:10 AM 12/14/2020    9:50 PM 11/21/2019    8:42 AM  Advanced Directives  Does Patient Have a Medical Advance Directive? No No No No No No No  Would patient like information on creating a medical advance directive? No - Patient declined No - Patient declined No - Patient declined  No - Patient declined  No - Patient declined    Current Medications (verified) Outpatient Encounter Medications as of 09/02/2022  Medication Sig   albuterol (PROAIR HFA) 108 (90 Base) MCG/ACT inhaler Inhale 2 puffs into the lungs every 6 (six) hours as needed for wheezing or shortness of breath.   azithromycin (ZITHROMAX) 250 MG tablet Take 2 tablets (500 mg) on  Day 1,  followed by 1 tablet (250 mg) once daily on Days 2 through 5.   Budeson-Glycopyrrol-Formoterol (BREZTRI AEROSPHERE) 160-9-4.8 MCG/ACT AERO Inhale 2 puffs into the lungs 2 (two) times daily.   Budeson-Glycopyrrol-Formoterol (BREZTRI AEROSPHERE) 160-9-4.8 MCG/ACT AERO Inhale 2 puffs into the lungs in the morning and at bedtime.   Calcium Carb-Cholecalciferol (CALCIUM 1000 + D PO) Take by mouth.   cyanocobalamin 100 MCG tablet Take 100 mcg by mouth daily.   Etanercept (ENBREL MINI) 50 MG/ML SOCT INJECT 50 MG INTO THE SKIN ONCE A WEEK.   folic acid (FOLVITE) 1 MG tablet Take 2 tablets (2 mg total) by mouth daily.   hydrOXYzine (VISTARIL) 25 MG capsule Take 1 capsule  (25 mg total) by mouth at bedtime.   methotrexate (RHEUMATREX) 2.5 MG tablet Take 8 tablets (20 mg total) by mouth once a week. Caution:Chemotherapy. Protect from light.   Methotrexate, PF, (OTREXUP) 20 MG/0.4ML SOAJ Inject 20 mg into the skin once a week.   Multiple Vitamin (MULTIVITAMIN WITH MINERALS) TABS tablet Take 1 tablet by mouth daily.   nicotine (NICODERM CQ - DOSED IN MG/24 HR) 7 mg/24hr patch Place 1 patch (7 mg total) onto the skin daily.   predniSONE (DELTASONE) 10 MG tablet Take 4 tablets each am x 2 days, 2 tablets each am x 2 days, 1 tablet each am x 2 days and stop   tamoxifen (NOLVADEX) 20 MG tablet Take 1 tablet (20 mg total) by mouth daily.   No facility-administered encounter medications on file as of 09/02/2022.    Allergies (verified) Trazodone and nefazodone and Other   History: Past Medical History:  Diagnosis Date   Anemia    during pregnancy   Anxiety    Asthma    Bronchitis age 14   Cancer (HCC)    left breast ca   COPD (chronic obstructive pulmonary disease) (HCC)    Depression    Hx of bladder infections    been over 5 years since last one    Malignant neoplasm of upper-outer quadrant of left breast in female, estrogen  receptor positive (HCC)    Rheumatoid arthritis (HCC)    unc rheum    Tobacco abuse    Trouble in sleeping    Past Surgical History:  Procedure Laterality Date   BREAST LUMPECTOMY WITH RADIOACTIVE SEED AND SENTINEL LYMPH NODE BIOPSY Left 08/30/2019   Procedure: LEFT BREAST LUMPECTOMY X 2 WITH RADIOACTIVE SEED AND SENTINEL LYMPH NODE BIOPSY;  Surgeon: Griselda Miner, MD;  Location: [facility] OR;  Service: General;  Laterality: Left;  PEC BLOCK   COLONOSCOPY WITH PROPOFOL N/A 03/15/2019   Procedure: COLONOSCOPY WITH PROPOFOL;  Surgeon: Wyline Mood, MD;  Location: [facility] ENDOSCOPY;  Service: Gastroenterology;  Laterality: N/A;   COLONOSCOPY WITH PROPOFOL N/A 07/29/2022   Procedure: COLONOSCOPY WITH PROPOFOL;  Surgeon: Wyline Mood, MD;  Location:  [facility] ENDOSCOPY;  Service: Gastroenterology;  Laterality: N/A;   HIP ARTHROPLASTY     JOINT REPLACEMENT     MULTIPLE TOOTH EXTRACTIONS     RE-EXCISION OF BREAST LUMPECTOMY Left 10/07/2019   Procedure: RE-EXCISION OF LEFT BREAST INFERIOR MARGIN;  Surgeon: Griselda Miner, MD;  Location: [facility];  Service: General;  Laterality: Left;   TOTAL HIP ARTHROPLASTY Left 02/15/2018   Procedure: TOTAL HIP ARTHROPLASTY ANTERIOR APPROACH;  Surgeon: Gean Birchwood, MD;  Location: [facility] ORS;  Service: Orthopedics;  Laterality: Left;   WISDOM TOOTH EXTRACTION     Family History  Problem Relation Age of Onset   Breast cancer Mother 36       metastatic   Cervical cancer Mother    Lung cancer Brother        maternal half brother; d. 33s   Lung cancer Maternal Uncle        d. 85s   Lung cancer Maternal Uncle        d. 38   Cancer Maternal Grandmother        unknown type; dx 47s   Healthy Son    Breast cancer Maternal Great-grandmother        dx unknown age   Social History   Socioeconomic History   Marital status: Single    Spouse name: Not on file   Number of children: 1   Years of education: 14 years   Highest education level: Some college, no degree  Occupational History   Not on file  Tobacco Use   Smoking status: Former    Packs/day: 1.00    Years: 40.00    Additional pack years: 0.00    Total pack years: 40.00    Types: Cigarettes    Quit date: 03/10/2021    Years since quitting: 1.4    Passive exposure: Past   Smokeless tobacco: Never   Tobacco comments:    on nicotine patches  Vaping Use   Vaping Use: Never used  Substance and Sexual Activity   Alcohol use: Yes    Comment: Occassionally   Drug use: Not Currently    Types: Cocaine    Comment: "it's been years ago"   Sexual activity: Not Currently    Birth control/protection: Post-menopausal  Other Topics Concern   Not on file  Social History Narrative   Not on file   Social Determinants of Health    Financial Resource Strain: Medium Risk (05/27/2021)   Overall Financial Resource Strain (CARDIA)    Difficulty of Paying Living Expenses: Somewhat hard  Food Insecurity: No Food Insecurity (08/09/2021)   Hunger Vital Sign    Worried About Running Out of Food in the Last Year: Never true  Ran Out of Food in the Last Year: Never true  Transportation Needs: No Transportation Needs (08/09/2021)   PRAPARE - Administrator, Civil Service (Medical): No    Lack of Transportation (Non-Medical): No  Recent Concern: Transportation Needs - Unmet Transportation Needs (05/27/2021)   PRAPARE - Transportation    Lack of Transportation (Medical): Yes    Lack of Transportation (Non-Medical): Yes  Physical Activity: Insufficiently Active (08/09/2021)   Exercise Vital Sign    Days of Exercise per Week: 4 days    Minutes of Exercise per Session: 30 min  Stress: Stress Concern Present (05/27/2021)   [employer] of Occupational Health - Occupational Stress Questionnaire    Feeling of Stress : To some extent  Social Connections: Socially Isolated (05/27/2021)   Social Connection and Isolation Panel [NHANES]    Frequency of Communication with Friends and Family: Once a week    Frequency of Social Gatherings with Friends and Family: Twice a week    Attends Religious Services: Never    Database administrator or Organizations: No    Attends Banker Meetings: Never    Marital Status: Divorced    Tobacco Counseling Counseling given: Not Answered Tobacco comments: on nicotine patches   Clinical Intake:                        Activities of Daily Living     No data to display          Patient Care Team: Marcine Matar, MD as PCP - General (Internal Medicine) Griselda Miner, MD as Consulting Physician (General Surgery) Pollyann Savoy, MD as Consulting Physician (Rheumatology) Sallee Lange, LCSW (Inactive) as Social Worker (General  Practice) Rachel Bo as Physician Assistant (Physician Assistant) Earna Coder, MD as Consulting Physician (Oncology)  Indicate any recent Medical Services you may have received from other than [facility] providers in the past year (date may be approximate).     Assessment:   This is a routine wellness examination for [name].  Hearing/Vision screen No results found.  Dietary issues and exercise activities discussed:     Goals Addressed   None    Depression Screen    06/17/2022    2:39 PM 03/17/2022    2:02 PM 08/09/2021   10:32 AM 06/20/2021   10:50 AM 05/27/2021    9:29 AM 12/18/2020    2:25 PM 04/01/2019    2:45 PM  PHQ 2/9 Scores  PHQ - 2 Score 1 1 0 2 1 5  0  PHQ- 9 Score 5 9  10 3 20      Fall Risk    06/17/2022    1:43 PM 03/17/2022    2:02 PM 08/09/2021   10:36 AM 06/20/2021   10:50 AM 06/22/2018    3:08 PM  Fall Risk   Falls in the past year? 0 0 0 0 0  Number falls in past yr: 0 0 0 0   Injury with Fall? 0 0 0 0   Risk for fall due to : No Fall Risks No Fall Risks No Fall Risks No Fall Risks   Follow up   Falls evaluation completed      MEDICARE RISK AT HOME:   TIMED UP AND GO:  Was the test performed?  No    Cognitive Function:        08/09/2021   10:37 AM  6CIT Screen  What Year? 0 points  What month? 0 points  What time? 0 points  Count back from 20 0 points  Months in reverse 0 points  Repeat phrase 0 points  Total Score 0 points    Immunizations Immunization History  Administered Date(s) Administered   Influenza,inj,Quad PF,6+ Mos 12/18/2020, 03/17/2022   PFIZER(Purple Top)SARS-COV-2 Vaccination 06/27/2019, 07/19/2019   Pneumococcal Conjugate-13 07/14/2018   Tdap 03/25/2018   Zoster Recombinat (Shingrix) 07/14/2018, 06/17/2022    TDAP status: Up to date  Pneumococcal vaccine status: Up to date  Covid-19 vaccine status: Information provided on how to obtain vaccines.   Qualifies for Shingles Vaccine? Yes   Zostavax  completed No   Shingrix Completed?: Yes  Screening Tests Health Maintenance  Topic Date Due   COVID-19 Vaccine (3 - Pfizer risk series) 08/16/2019   Medicare Annual Wellness (AWV)  08/10/2022   INFLUENZA VACCINE  10/09/2022   Lung Cancer Screening  01/22/2023   MAMMOGRAM  02/11/2024   PAP SMEAR-Modifier  07/19/2024   Colonoscopy  07/28/2025   DTaP/Tdap/Td (2 - Td or Tdap) 03/25/2028   Hepatitis C Screening  Completed   HIV Screening  Completed   Zoster Vaccines- Shingrix  Completed   HPV VACCINES  Aged Out    Health Maintenance  Health Maintenance Due  Topic Date Due   COVID-19 Vaccine (3 - Pfizer risk series) 08/16/2019   Medicare Annual Wellness (AWV)  08/10/2022    Colorectal cancer screening: Type of screening: Colonoscopy. Completed 07/29/22. Repeat every 3 years  Mammogram status: Completed 02/10/22. Repeat every year  Lung Cancer Screening: (Low Dose CT Chest recommended if Age 10-80 years, 20 pack-year currently smoking OR have quit w/in 15years.) does qualify.   Lung Cancer Screening Referral: last 01/21/22  Additional Screening:  Hepatitis C Screening: does qualify; Completed 05/11/18  Vision Screening: Recommended annual ophthalmology exams for early detection of glaucoma and other disorders of the eye. Is the patient up to date with their annual eye exam?  {YES/NO:21197} Who is the provider or what is the name of the office in which the patient attends annual eye exams? *** If pt is not established with a provider, would they like to be referred to a provider to establish care? {YES/NO:21197}.   Dental Screening: Recommended annual dental exams for proper oral hygiene  Community Resource Referral / Chronic Care Management: CRR required this visit?  {YES/NO:21197}  CCM required this visit?  {CCM Required choices:[phone]}     Plan:     I have personally reviewed and noted the following in the patient's chart:   Medical and social history Use of  alcohol, tobacco or illicit drugs  Current medications and supplements including opioid prescriptions. {Opioid Prescriptions:[id number]} Functional ability and status Nutritional status Physical activity Advanced directives List of other physicians Hospitalizations, surgeries, and ER visits in previous 12 months Vitals Screenings to include cognitive, depression, and falls Referrals and appointments  In addition, I have reviewed and discussed with patient certain preventive protocols, quality metrics, and best practice recommendations. A written personalized care plan for preventive services as well as general preventive health recommendations were provided to patient.     Kandis Fantasia [place], [place]   06/09/270   After Visit Summary: {CHL AMB AWV After Visit [phone]}  Nurse Notes: ***

## 2022-09-02 ENCOUNTER — Ambulatory Visit: Payer: No Typology Code available for payment source | Attending: Internal Medicine

## 2022-09-02 VITALS — Ht 65.0 in | Wt 112.0 lb

## 2022-09-02 DIAGNOSIS — Z0000 Encounter for general adult medical examination without abnormal findings: Secondary | ICD-10-CM | POA: Diagnosis not present

## 2022-09-05 ENCOUNTER — Other Ambulatory Visit: Payer: Self-pay | Admitting: *Deleted

## 2022-09-05 ENCOUNTER — Telehealth: Payer: Self-pay | Admitting: *Deleted

## 2022-09-05 MED ORDER — PREDNISONE 5 MG PO TABS
ORAL_TABLET | ORAL | 0 refills | Status: DC
Start: 2022-09-05 — End: 2022-09-05

## 2022-09-05 MED ORDER — PREDNISONE 5 MG PO TABS: ORAL_TABLET | ORAL | 0 refills | Status: DC

## 2022-09-05 NOTE — Addendum Note (Signed)
Addended by: Henriette Combs on: 09/05/2022 12:24 PM   Modules accepted: Orders

## 2022-09-05 NOTE — Telephone Encounter (Signed)
Ok to send in prednisone starting at 20 mg tapering by 5 mg every 4 days.  Take prednisone in the morning with food and avoid the use of NSAIDs.

## 2022-09-05 NOTE — Telephone Encounter (Signed)
Patient requesting prednisone, inflammation in right hand x 3 days, knot on right elbow, [facility], [place]

## 2022-09-08 NOTE — Progress Notes (Deleted)
Office Visit Note  Patient: [name]             Date of Birth: Oct 31, 1960           MRN: [id number]             PCP: Marcine Matar, MD Referring: Marcine Matar, MD Visit Date: 09/10/2022 Occupation: @GUAROCC @  Subjective:  No chief complaint on file.   History of Present Illness: [name] is a 62 y.o. female ***     Activities of Daily Living:  Patient reports morning stiffness for *** {minute/hour:19697}.   Patient {ACTIONS;DENIES/REPORTS:21021675::"Denies"} nocturnal pain.  Difficulty dressing/grooming: {ACTIONS;DENIES/REPORTS:21021675::"Denies"} Difficulty climbing stairs: {ACTIONS;DENIES/REPORTS:21021675::"Denies"} Difficulty getting out of chair: {ACTIONS;DENIES/REPORTS:21021675::"Denies"} Difficulty using hands for taps, buttons, cutlery, and/or writing: {ACTIONS;DENIES/REPORTS:21021675::"Denies"}  No Rheumatology ROS completed.   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   Adenomatous colon polyp 03/16/2019   Degenerative joint disease of shoulder region 06/08/2018   Rheumatoid arthritis involving both hands with positive rheumatoid factor (HCC) 06/08/2018   Rheumatoid arthritis involving both feet with positive rheumatoid factor (HCC) 06/08/2018   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   Status post total hip replacement, 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   Smoker 06/08/2018   Anxiety and depression 06/08/2018   Family history of breast cancer 03/25/2018   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   Rheumatoid arthritis involving multiple sites (HCC) 01/15/2018   Tobacco dependence 01/15/2018    Opioid use agreement exists 01/15/2018   Positive depression screening 01/15/2018   Homeless 01/15/2018    Past Medical History:  Diagnosis Date   Anemia    during pregnancy   Anxiety    Asthma    Bronchitis age 2   Cancer (HCC)    left breast ca   COPD (chronic obstructive pulmonary disease) (HCC)    Depression    Hx of bladder infections    been over 5 years since last one    Malignant neoplasm of upper-outer quadrant of left breast in female, estrogen receptor positive (HCC)    Rheumatoid arthritis (HCC)    unc rheum    Tobacco abuse    Trouble in sleeping     Family History  Problem Relation Age of Onset   Breast cancer Mother 6       metastatic   Cervical cancer Mother    Lung cancer Brother        maternal half brother; d. 43s   Lung cancer Maternal Uncle        d. 47s   Lung cancer Maternal Uncle        d. 53   Cancer Maternal Grandmother        unknown type; dx 52s   Healthy Son    Breast cancer Maternal Great-grandmother        dx unknown age   Past Surgical History:  Procedure Laterality Date   BREAST LUMPECTOMY WITH RADIOACTIVE SEED AND SENTINEL LYMPH NODE BIOPSY Left 08/30/2019   Procedure: LEFT BREAST LUMPECTOMY X 2 WITH RADIOACTIVE SEED AND SENTINEL LYMPH NODE BIOPSY;  Surgeon: Griselda Miner, MD;  Location: [facility] OR;  Service: General;  Laterality: Left;  PEC BLOCK   COLONOSCOPY WITH PROPOFOL N/A 03/15/2019   Procedure: COLONOSCOPY WITH PROPOFOL;  Surgeon: Wyline Mood, MD;  Location: [facility] ENDOSCOPY;  Service: Gastroenterology;  Laterality: N/A;   COLONOSCOPY WITH PROPOFOL N/A 07/29/2022   Procedure: COLONOSCOPY WITH PROPOFOL;  Surgeon: Wyline Mood, MD;  Location: [facility] END[facility]COPY;  Service: Gastroenterology;  Laterality: N/A;   HIP ARTHROPLASTY     JOINT REPLACEMENT     MULTIPLE TOOTH EXTRACTIONS     RE-EXCISION OF BREAST LUMPECTOMY Left 10/07/2019   Procedure: RE-EXCISION OF LEFT BREAST INFERIOR MARGIN;  Surgeon: Griselda Miner, MD;  Location: [facility];  Service: General;  Laterality: Left;   TOTAL HIP ARTHROPLASTY Left 02/15/2018   Procedure: TOTAL HIP ARTHROPLASTY ANTERIOR APPROACH;  Surgeon: Gean Birchwood, MD;  Location: [facility];  Service: Orthopedics;  Laterality: Left;   WISDOM TOOTH EXTRACTION     Social History   Social History Narrative   Not on file   Immunization History  Administered Date(s) Administered   Influenza,inj,Quad PF,6+ Mos 12/18/2020, 03/17/2022   PFIZER(Purple Top)SARS-COV-2 Vaccination 06/27/2019, 07/19/2019   Pneumococcal Conjugate-13 07/14/2018   Tdap 03/25/2018   Zoster Recombinant(Shingrix) 07/14/2018, 06/17/2022     Objective: Vital Signs: There were no vitals taken for this visit.   Physical Exam   Musculoskeletal Exam: ***  CDAI Exam: CDAI Score: -- Patient Global: --; Provider Global: -- Swollen: --; Tender: -- Joint Exam 09/10/2022   No joint exam has been documented for this visit   There is currently no information documented on the homunculus. Go to the Rheumatology activity and complete the homunculus joint exam.  Investigation: No additional findings.  Imaging: [place] AXILLA LEFT  Result Date: 09/01/2022 CLINICAL DATA:  62 year old female presenting for six-month follow-up for probably benign left axillary lymph nodes. The patient is status post LEFT lumpectomy for malignancy in 2021. Also, patient had a chest CT on 06/27/2021 which identified morphologically abnormal lymph nodes within the deep LEFT axilla which were recommended for an attempted ultrasound-guided biopsy. Patient presented for the ultrasound-guided biopsy in, however, the lymph nodes were not safely accessible due to the deep position in the LEFT axilla adjacent to axillary blood vessels and inability to properly position the patient for the biopsies due to LEFT shoulder arthritis. Patient has a history of rheumatoid arthritis. Given the inaccessibility of the lymph nodes at that time for ultrasound-guided  biopsy, and as 2 similar-appearing lymph nodes were identified on an earlier MRI guided breast biopsy of 07/07/2019 suggesting benignity for these axillary lymph nodes, a six-month follow-up LEFT axillary ultrasound was recommended. Patient was most recently assessed on February 10, 2022, documenting at least six-month sonographic stability and an additional six-month follow-up was recommended. EXAM: ULTRASOUND OF THE LEFT AXILLA COMPARISON:  Previous exam(s). FINDINGS: Targeted left axillary ultrasound was performed, demonstrating stable to slightly decreased size of 2 mildly prominent axillary lymph nodes, with cortical thickness of up to 5 mm. These lymph nodes measured up to 6 mm in cortical thickness on February 10, 2022 and up to 7 mm on Aug 02, 2021. IMPRESSION: Documented 1 year sonographic stability of 2 mildly prominent left axillary lymph nodes, which are minimally decreased in size compared to May 2023 and favored to be reactive. RECOMMENDATION: BILATERAL diagnostic mammogram and LEFT axillary ultrasound in 6 months to document 18 month stability of the probably benign reactive lymph nodes in the left axilla. The patient will be due for bilateral screening at this time. I have discussed the  findings and recommendations with the patient. If applicable, a reminder letter will be sent to the patient regarding the next appointment. BI-RADS CATEGORY  3: Probably benign. Electronically Signed   By: Jacob Moores M.D.   On: 09/01/2022 16:23   Recent Labs: Lab Results  Component Value Date   WBC 5.4 06/17/2022   HGB 13.9 06/17/2022   PLT 196 06/17/2022   NA 143 06/17/2022   K 4.7 06/17/2022   CL 105 06/17/2022   CO2 28 06/17/2022   GLUCOSE 97 06/17/2022   BUN 19 06/17/2022   CREATININE 0.91 06/17/2022   BILITOT 0.4 06/17/2022   ALKPHOS 66 12/14/2020   AST 18 06/17/2022   ALT 15 06/17/2022   PROT 6.4 06/17/2022   ALBUMIN 4.4 12/14/2020   CALCIUM 9.5 06/17/2022   GFRAA 101 07/30/2020    QFTBGOLDPLUS NEGATIVE 08/19/2021    Speciality Comments: No specialty comments available.  Procedures:  No procedures performed Allergies: Trazodone and nefazodone and Other   Assessment / Plan:     Visit Diagnoses: No diagnosis found.  Orders: No orders of the defined types were placed in this encounter.  No orders of the defined types were placed in this encounter.   Face-to-face time spent with patient was *** minutes. Greater than 50% of time was spent in counseling and coordination of care.  Follow-Up Instructions: No follow-ups on file.   Ellen Henri, CMA  Note - This record has been created using Animal nutritionist.  Chart creation errors have been sought, but may not always  have been located. Such creation errors do not reflect on  the standard of medical care.

## 2022-09-09 ENCOUNTER — Other Ambulatory Visit (HOSPITAL_COMMUNITY): Payer: Self-pay

## 2022-09-10 ENCOUNTER — Ambulatory Visit: Payer: No Typology Code available for payment source | Admitting: Physician Assistant

## 2022-09-10 DIAGNOSIS — M19211 Secondary osteoarthritis, right shoulder: Secondary | ICD-10-CM

## 2022-09-10 DIAGNOSIS — D0512 Intraductal carcinoma in situ of left breast: Secondary | ICD-10-CM

## 2022-09-10 DIAGNOSIS — Z803 Family history of malignant neoplasm of breast: Secondary | ICD-10-CM

## 2022-09-10 DIAGNOSIS — M24522 Contracture, left elbow: Secondary | ICD-10-CM

## 2022-09-10 DIAGNOSIS — M0579 Rheumatoid arthritis with rheumatoid factor of multiple sites without organ or systems involvement: Secondary | ICD-10-CM

## 2022-09-10 DIAGNOSIS — J438 Other emphysema: Secondary | ICD-10-CM

## 2022-09-10 DIAGNOSIS — Z96642 Presence of left artificial hip joint: Secondary | ICD-10-CM

## 2022-09-10 DIAGNOSIS — R911 Solitary pulmonary nodule: Secondary | ICD-10-CM

## 2022-09-10 DIAGNOSIS — Z79899 Other long term (current) drug therapy: Secondary | ICD-10-CM

## 2022-09-10 DIAGNOSIS — F17200 Nicotine dependence, unspecified, uncomplicated: Secondary | ICD-10-CM

## 2022-09-10 DIAGNOSIS — Z862 Personal history of diseases of the blood and blood-forming organs and certain disorders involving the immune mechanism: Secondary | ICD-10-CM

## 2022-09-10 DIAGNOSIS — F419 Anxiety disorder, unspecified: Secondary | ICD-10-CM

## 2022-09-10 DIAGNOSIS — G8929 Other chronic pain: Secondary | ICD-10-CM

## 2022-09-15 ENCOUNTER — Other Ambulatory Visit (HOSPITAL_COMMUNITY): Payer: Self-pay

## 2022-09-15 ENCOUNTER — Other Ambulatory Visit: Payer: Self-pay

## 2022-09-21 IMAGING — CT CT CHEST HIGH RESOLUTION
2 of 7 series · 12 of 36 positions shown, 15 images · non-contrast
Comparison: None.

CLINICAL DATA: Lung crackles, history of RA



[Series 4: thorax 2.00 cor · coronal · 0.66mm/px · 3 of 127 slices shown]
[im 26/127  lung]
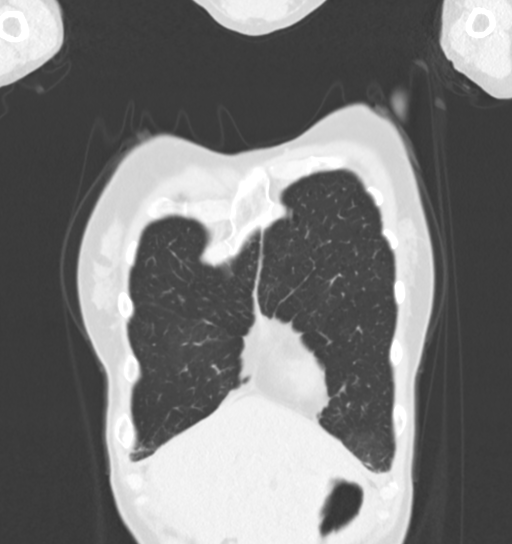
[im 51/127  lung]
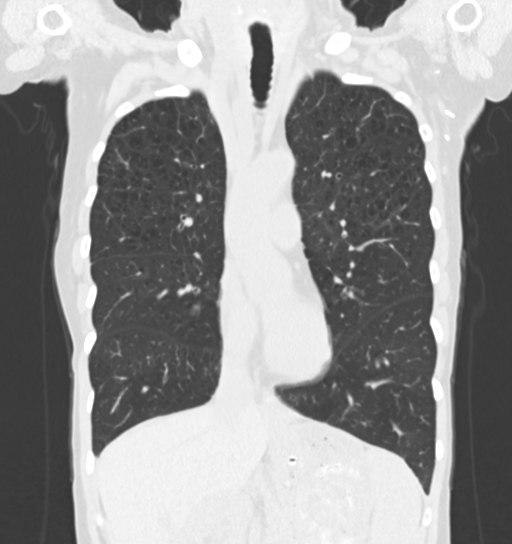
[im 76/127  lung]
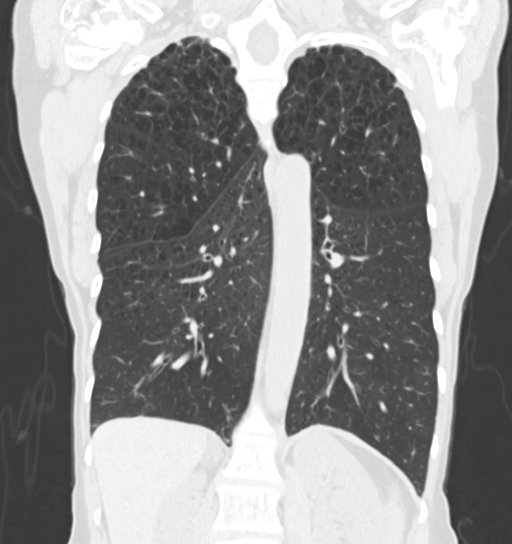

[Series 10: high res (id) thorax 1.00 ax · axial · 0.50mm/px · z∈[-1225,-923]mm · 9 of 358 slices shown, 12 images]
[im 28/358  mediastinal]
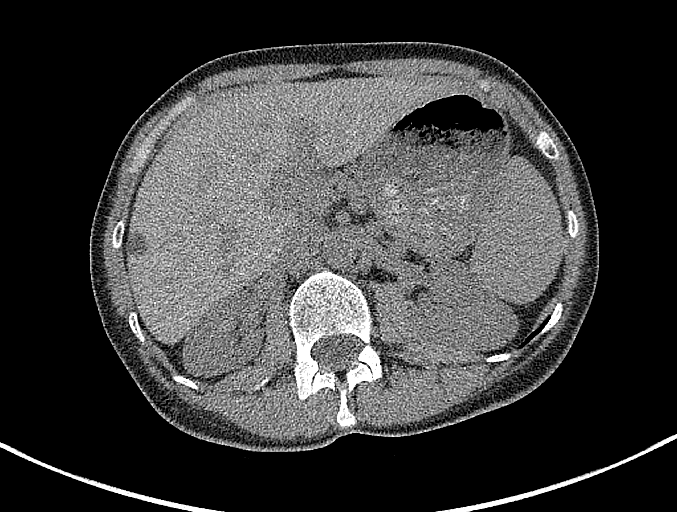
[im 28/358  lung]
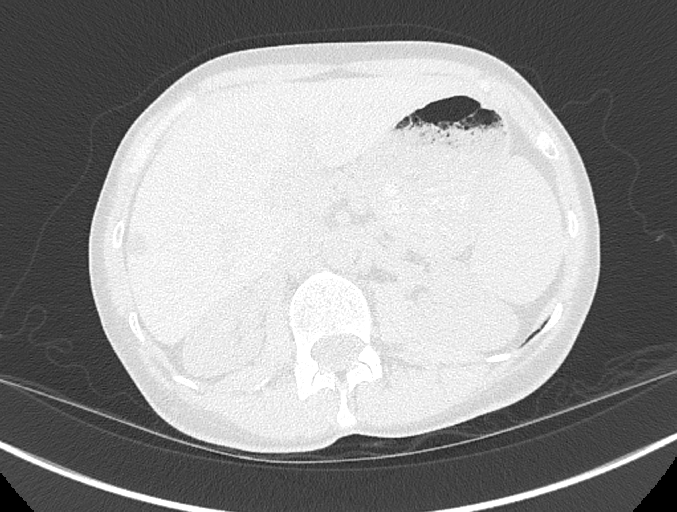
[im 83/358  lung]
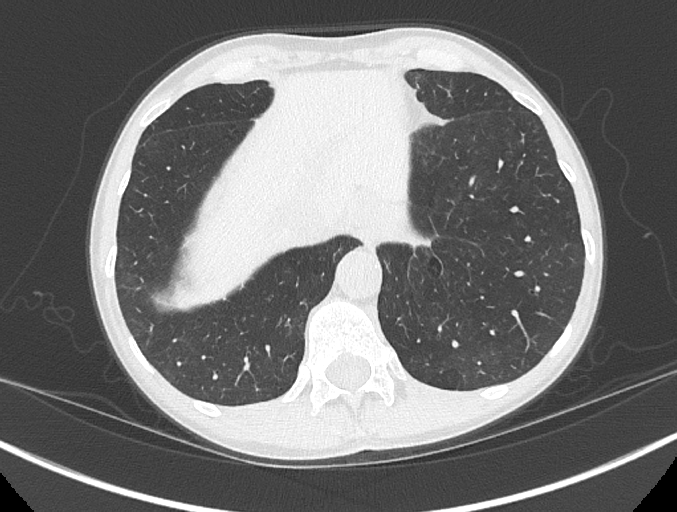
[im 110/358  lung]
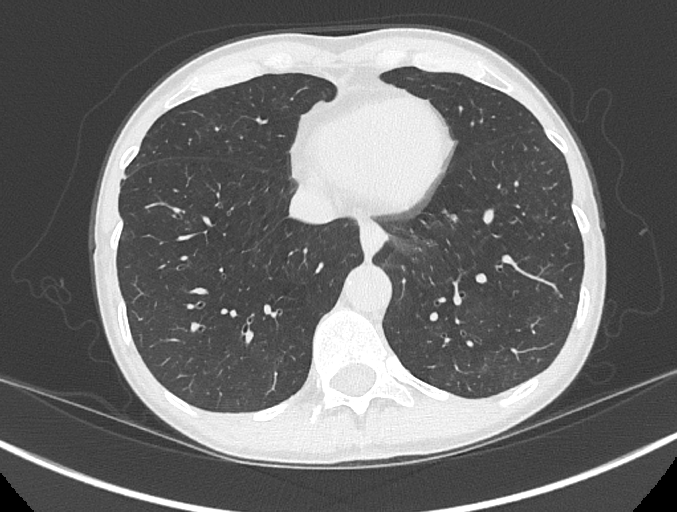
[im 138/358  lung]
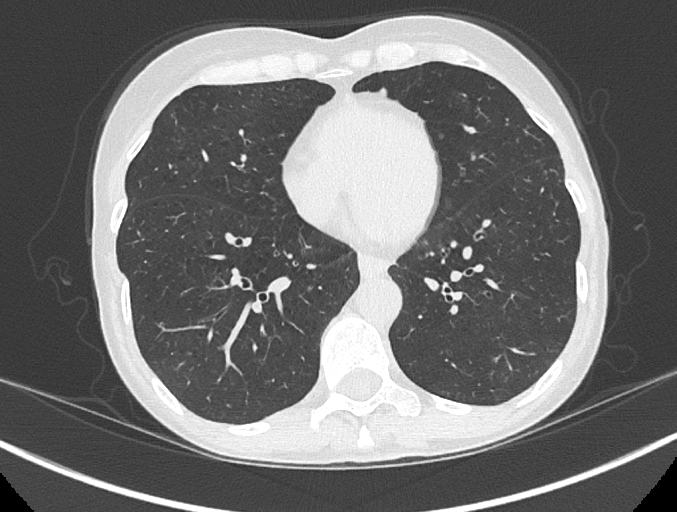
[im 193/358  mediastinal]
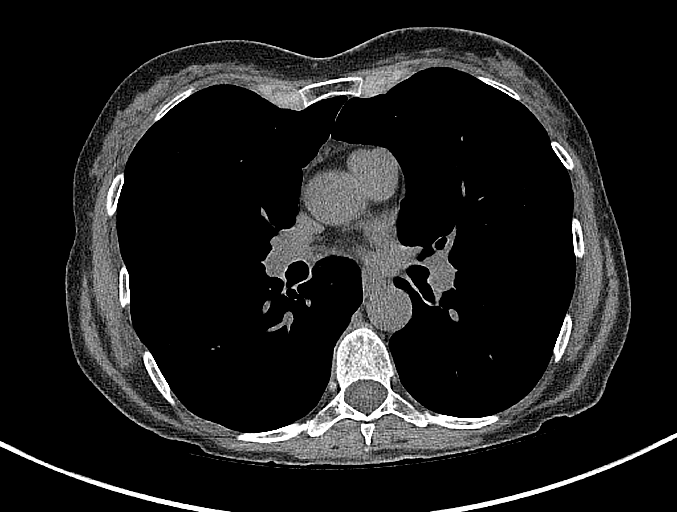
[im 193/358  lung]
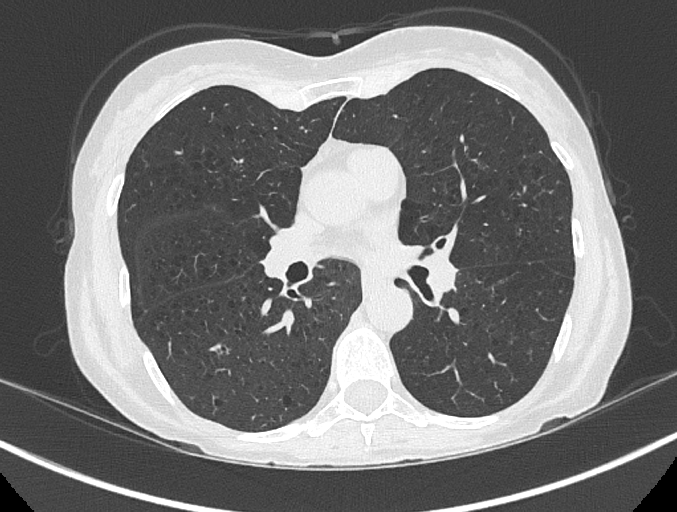
[im 220/358  lung]
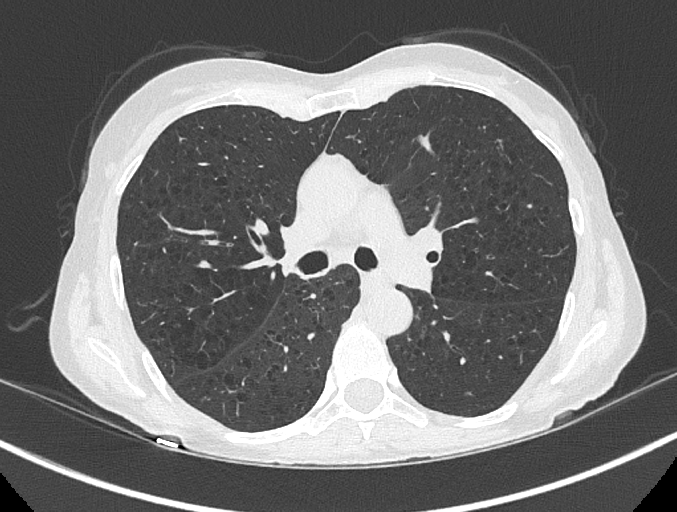
[im 248/358  lung]
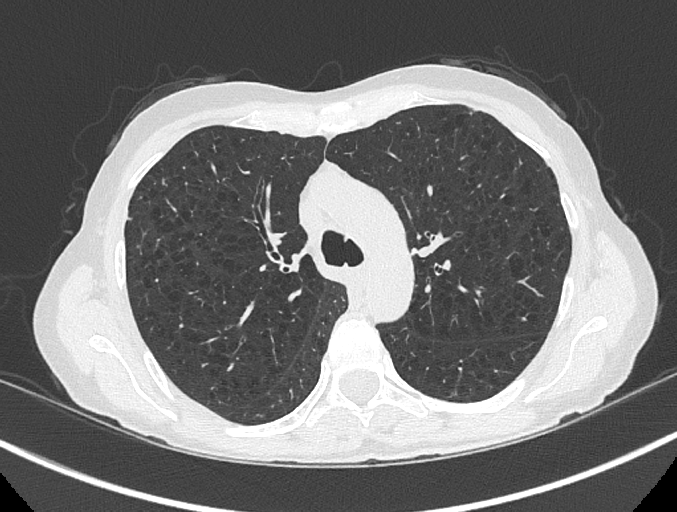
[im 303/358  lung]
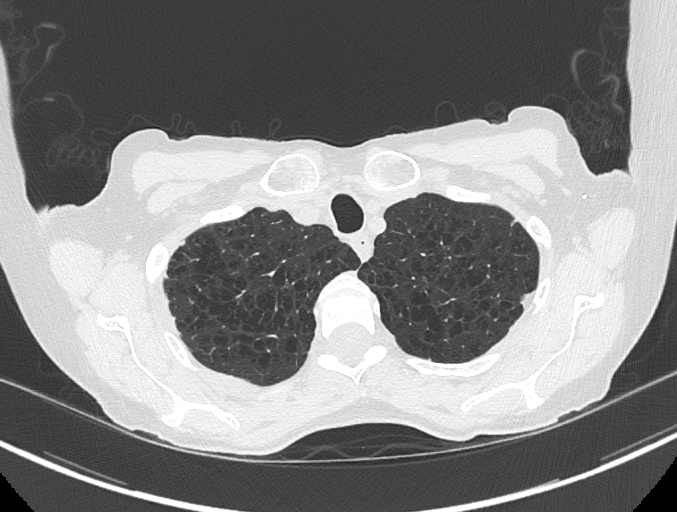
[im 330/358  mediastinal]
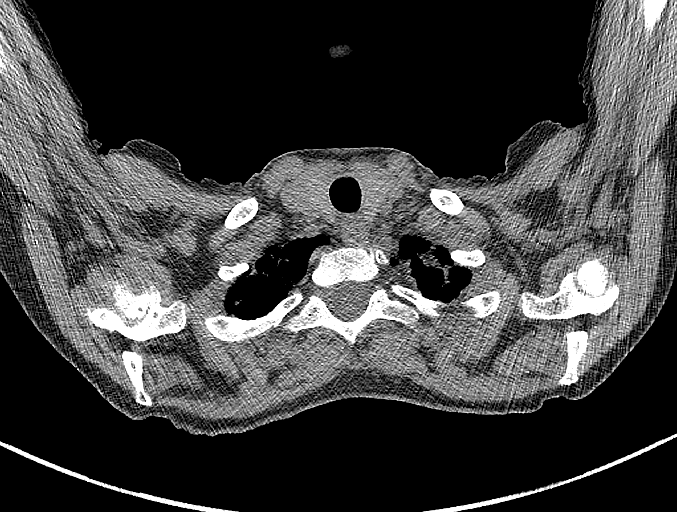
[im 330/358  lung]
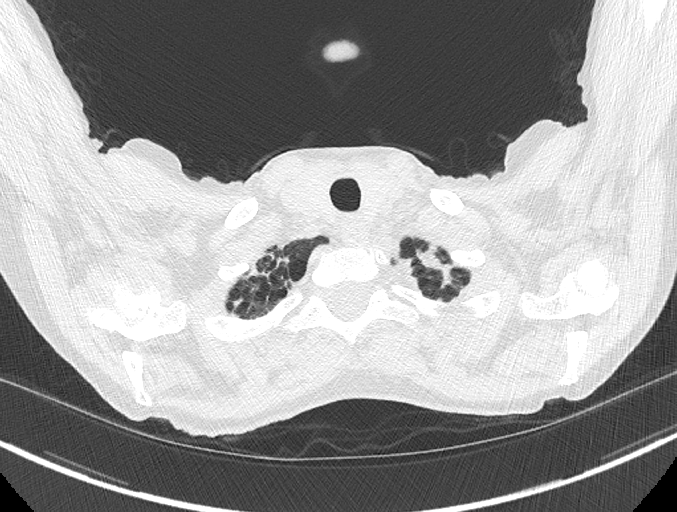

[12 of 36 positions shown; findings below may reference images not displayed]

FINDINGS: Cardiovascular: Normal heart size. No pericardial effusion. Mild
atherosclerotic disease of the thoracic aorta.

Mediastinum/Nodes: Esophagus and thyroid are unremarkable.
Asymmetric left subcentimeter axillary lymph nodes. Reference left
axillary lymph node measuring 8 mm in short axis on series 2, image
24.

Lungs/Pleura: Central airways are patent. Biapical pleuroparenchymal
scarring. Limited evaluation for air trapping, extra [REDACTED] images
obtained during inspiration. Mild bilateral bronchial wall
thickening. Moderate centrilobular emphysema. No reticular
opacities, architectural distortion, traction bronchiectasis or
honeycomb change.Left upper lobe nodular opacity measuring 7 x 6 mm
on series 10, image 79, central lucency which is likely due to
associated bronchus.

Upper Abdomen: Low-attenuation lesions of the right left lobe of the
liver, likely simple cysts. Punctate nonobstructing left renal
stone. No acute abnormality.

Musculoskeletal: Advanced degenerative changes of the right
shoulder.
IMPRESSION: 1. Left upper lobe nodule measuring 6.5 mm in mean diameter with
central lucency which is likely due to associated bronchus.
Recommend follow-up chest CT in 6 months for further evaluation.
2. Mild bilateral bronchial wall thickening, findings can be seen in
the setting of bronchitis, likely related to COPD.
3. Asymmetric left subcentimeter axillary lymph nodes. Recommend
left axillary ultrasound for further evaluation.
4. Aortic Atherosclerosis ([id number]-[id number]) and Emphysema ([id number].V).

## 2022-10-01 NOTE — Progress Notes (Unsigned)
Office Visit Note  Patient: [name]             Date of Birth: 1960/06/24           MRN: [id number]             PCP: Marcine Matar, MD Referring: Marcine Matar, MD Visit Date: 10/15/2022 Occupation: @GUAROCC @  Subjective:  Fatigue   History of Present Illness: [name] is a 62 y.o. female with history of seropositive rheumatoid arthritis.  Patient remains on Enbrel 50 mg sq injections once weekly, Otrexup 20 mg sq injections once weekly, and folic acid 2 mg daily.  Patient is tolerating combination therapy without any side effects or injection site reactions.  She has not had any interruptions in therapy recently.  Patient states that she has started taking collagen which she has found to be helpful.  She states that she continues to experience fatigue on a daily basis.  She is unsure if the fatigue is related to her arthritis but finds that it interferes with her quality of life.  Due to her current work schedule she has had less difficulty resting during the day and continues to have interrupted sleep at night. Patient had a recent flare at the end of June requiring a prednisone taper to be sent to the pharmacy on 09/05/2022.  She is not currently experiencing any joint swelling but continues to have intermittent discomfort in her right shoulder and elbow.  She has intermittent stiffness in both hands.    Activities of Daily Living:  Patient reports morning stiffness for 1-2 hours.   Patient Reports nocturnal pain.  Difficulty dressing/grooming: Denies Difficulty climbing stairs: Denies Difficulty getting out of chair: Denies Difficulty using hands for taps, buttons, cutlery, and/or writing: Reports  Review of Systems  Constitutional:  Positive for fatigue.  HENT:  Positive for mouth dryness. Negative for mouth sores.   Eyes:  Negative for dryness.  Respiratory:  Positive for shortness of breath.   Cardiovascular:  Negative for chest pain and palpitations.   Gastrointestinal:  Negative for blood in stool, constipation and diarrhea.  Endocrine: Negative for increased urination.  Genitourinary:  Positive for involuntary urination.  Musculoskeletal:  Positive for joint pain, joint pain, joint swelling, muscle weakness and morning stiffness. Negative for gait problem, myalgias, muscle tenderness and myalgias.  Skin:  Negative for color change, rash, hair loss and sensitivity to sunlight.  Allergic/Immunologic: Negative for susceptible to infections.  Neurological:  Negative for dizziness and headaches.  Hematological:  Positive for bruising/bleeding tendency. Negative for swollen glands.  Psychiatric/Behavioral:  Positive for sleep disturbance. Negative for depressed mood. The patient is not nervous/anxious.     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   Adenomatous colon polyp 03/16/2019   Degenerative joint disease of shoulder region 06/08/2018   Rheumatoid arthritis involving both hands with positive rheumatoid factor (HCC) 06/08/2018   Rheumatoid arthritis involving both feet with positive rheumatoid factor (HCC) 06/08/2018   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   Status post total hip replacement, 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   Smoker 06/08/2018   Anxiety  and depression 06/08/2018   Family history of breast cancer 03/25/2018   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   Rheumatoid arthritis involving multiple sites (HCC) 01/15/2018   Tobacco dependence 01/15/2018   Opioid use agreement exists 01/15/2018   Positive depression screening 01/15/2018   Homeless 01/15/2018    Past  Medical History:  Diagnosis Date   Anemia    during pregnancy   Anxiety    Asthma    Bronchitis age 27   Cancer (HCC)    left breast ca   COPD (chronic obstructive pulmonary disease) (HCC)    Depression    Hx of bladder infections    been over 5 years since last one    Malignant neoplasm of upper-outer quadrant of left breast in female, estrogen receptor positive (HCC)    Rheumatoid arthritis (HCC)    unc rheum    Tobacco abuse    Trouble in sleeping     Family History  Problem Relation Age of Onset   Breast cancer Mother 86       metastatic   Cervical cancer Mother    Lung cancer Brother        maternal half brother; d. 59s   Lung cancer Maternal Uncle        d. 59s   Lung cancer Maternal Uncle        d. 35   Cancer Maternal Grandmother        unknown type; dx 86s   Healthy Son    Breast cancer Maternal Great-grandmother        dx unknown age   Past Surgical History:  Procedure Laterality Date   BREAST LUMPECTOMY WITH RADIOACTIVE SEED AND SENTINEL LYMPH NODE BIOPSY Left 08/30/2019   Procedure: LEFT BREAST LUMPECTOMY X 2 WITH RADIOACTIVE SEED AND SENTINEL LYMPH NODE BIOPSY;  Surgeon: Griselda Miner, MD;  Location: [facility] OR;  Service: General;  Laterality: Left;  PEC BLOCK   COLONOSCOPY WITH PROPOFOL N/A 03/15/2019   Procedure: COLONOSCOPY WITH PROPOFOL;  Surgeon: Wyline Mood, MD;  Location: [facility] ENDOSCOPY;  Service: Gastroenterology;  Laterality: N/A;   COLONOSCOPY WITH PROPOFOL N/A 07/29/2022   Procedure: COLONOSCOPY WITH PROPOFOL;  Surgeon: Wyline Mood, MD;  Location: [facility] ENDOSCOPY;  Service: Gastroenterology;  Laterality: N/A;   HIP ARTHROPLASTY     JOINT REPLACEMENT     MULTIPLE TOOTH EXTRACTIONS     RE-EXCISION OF BREAST LUMPECTOMY Left 10/07/2019   Procedure: RE-EXCISION OF LEFT BREAST INFERIOR MARGIN;  Surgeon: Griselda Miner, MD;  Location: [facility];  Service: General;  Laterality: Left;   TOTAL HIP ARTHROPLASTY Left 02/15/2018   Procedure: TOTAL HIP  ARTHROPLASTY ANTERIOR APPROACH;  Surgeon: Gean Birchwood, MD;  Location: [facility] ORS;  Service: Orthopedics;  Laterality: Left;   WISDOM TOOTH EXTRACTION     Social History   Social History Narrative   Not on file   Immunization History  Administered Date(s) Administered   Influenza,inj,Quad PF,6+ Mos 12/18/2020, 03/17/2022   PFIZER(Purple Top)SARS-COV-2 Vaccination 06/27/2019, 07/19/2019   Pneumococcal Conjugate-13 07/14/2018   Tdap 03/25/2018   Zoster Recombinant(Shingrix) 07/14/2018, 06/17/2022     Objective: Vital Signs: BP 99/68 (BP Location: Left Arm, Patient Position: Sitting, Cuff Size: Normal)   Pulse 91   Resp 17   Ht 5' 5.5" (1.664 m)   Wt 114 lb 3.2 oz (51.8 kg)   BMI 18.71 kg/m    Physical Exam Vitals and nursing note reviewed.  Constitutional:      Appearance: She is well-developed.  HENT:  Head: Normocephalic and atraumatic.  Eyes:     Conjunctiva/sclera: Conjunctivae normal.  Cardiovascular:     Rate and Rhythm: Normal rate and regular rhythm.     Heart sounds: Normal heart sounds.  Pulmonary:     Effort: Pulmonary effort is normal.     Breath sounds: Normal breath sounds.  Abdominal:     General: Bowel sounds are normal.     Palpations: Abdomen is soft.  Musculoskeletal:     Cervical back: Normal range of motion.  Lymphadenopathy:     Cervical: No cervical adenopathy.  Skin:    General: Skin is warm and dry.     Capillary Refill: Capillary refill takes less than 2 seconds.  Neurological:     Mental Status: She is alert and oriented to person, place, and time.  Psychiatric:        Behavior: Behavior normal.      Musculoskeletal Exam: C-spine has limited ROM with lateral rotation. Painful and limited ROM of both shoulders.  Left elbow contracture.  Wrist joints have good ROM with no tenderness or synovitis.  PIP and DIP thickening.  Synovial thickening of MCPs. Left hip replacement has mild discomfort. Right hip has good ROM with no groin pain.   Knee joints have good ROM with no warmth or effusion.  Ankle joints have good ROM with no tenderness or joint swelling. No tenderness or synovitis of MTP joints.   CDAI Exam: CDAI Score: 9  Patient Global: 40 / 100; Provider Global: 40 / 100 Swollen: 0 ; Tender: 1  Joint Exam 10/15/2022      Right  Left  Glenohumeral   Tender        Investigation: No additional findings.  Imaging: No results found.  Recent Labs: Lab Results  Component Value Date   WBC 5.4 06/17/2022   HGB 13.9 06/17/2022   PLT 196 06/17/2022   NA 143 06/17/2022   K 4.7 06/17/2022   CL 105 06/17/2022   CO2 28 06/17/2022   GLUCOSE 97 06/17/2022   BUN 19 06/17/2022   CREATININE 0.91 06/17/2022   BILITOT 0.4 06/17/2022   ALKPHOS 66 12/14/2020   AST 18 06/17/2022   ALT 15 06/17/2022   PROT 6.4 06/17/2022   ALBUMIN 4.4 12/14/2020   CALCIUM 9.5 06/17/2022   GFRAA 101 07/30/2020   QFTBGOLDPLUS NEGATIVE 08/19/2021    Speciality Comments: No specialty comments available.  Procedures:  No procedures performed Allergies: Trazodone and nefazodone and Other   Assessment / Plan:     Visit Diagnoses: Rheumatoid arthritis involving multiple sites with positive rheumatoid factor (HCC): She has no synovitis on examination today.  Patient had a rheumatoid arthritis flare at the end of June requiring a prednisone taper to be sent to the pharmacy on 09/05/2022.  The prednisone taper helped to alleviate her joint pain and inflammation she was experiencing as well as temporarily helped to relieve her daily fatigue.  She has intermittent discomfort in both shoulders (R>L) and both hands.  No active inflammation noted today. She remains on Enbrel 50 mg sq injections once weekly and Otrexup 20 mg sq injections once weekly.  She has not had any interruptions in therapy.  No recent or recurrent infections.   No medication changes will be made at this time.  She will follow up in 5 months or sooner if needed.   High risk  medication use - Enbrel 50 mg sq injections once weekly, Otrexup 20 mg sq injections once weekly, folic acid 2  mg daily.  CBC and CMP WNL on 06/17/22. Orders for CBC and CMP released today.  Her next lab work will be due in November and every 3 months to monitor for drug toxicity. TB gold negative on 08/19/21. Order for TB gold released today. No recent or recurrent infections.  Discussed the importance of holding enbrel and otrexup if she develops signs or symptoms of an infection and to resume once the infection has completely cleared.  - Plan: COMPLETE METABOLIC PANEL WITH GFR, CBC with Differential/Platelet, QuantiFERON-TB Gold Plus  Screening for tuberculosis - Order for TB gold released today. Plan: QuantiFERON-TB Gold Plus  Contracture of left elbow - Unchanged  Other secondary osteoarthritis of both shoulders: Chronic pain and stiffness, right shoulder greater than left.  She has occasional nocturnal pain in her right shoulder. Discussed list of natural antiinflammatories.  She has also started taking collagen daily.   Status post total hip replacement, left: Good range of motion with some discomfort on examination today.  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  Stable probably benign reactive LN left axilla-ultrasound updated on 09/01/2022. Patient remains on tamoxifen.  Other emphysema (HCC) - HRCT obtained on June 28, 2021 was consistent with mild bilateral bronchial wall thickening consistent with COPD and and a pulmonary nodule was noted.  Solitary pulmonary nodule on lung CT - Dr. Sherene Sires  Smoker  Anxiety and depression  Family history of breast cancer  History of anemia    Orders: Orders Placed This Encounter  Procedures   COMPLETE METABOLIC PANEL WITH GFR   CBC with Differential/Platelet   QuantiFERON-TB Gold Plus   No  orders of the defined types were placed in this encounter.  Follow-Up Instructions: Return in about 5 months (around 03/17/2023) for Rheumatoid arthritis.   Gearldine Bienenstock, PA-C  Note - This record has been created using Dragon software.  Chart creation errors have been sought, but may not always  have been located. Such creation errors do not reflect on  the standard of medical care.

## 2022-10-02 ENCOUNTER — Other Ambulatory Visit (HOSPITAL_COMMUNITY): Payer: Self-pay

## 2022-10-06 ENCOUNTER — Other Ambulatory Visit (HOSPITAL_COMMUNITY): Payer: Self-pay

## 2022-10-07 ENCOUNTER — Other Ambulatory Visit: Payer: Self-pay

## 2022-10-08 ENCOUNTER — Other Ambulatory Visit: Payer: Self-pay

## 2022-10-10 ENCOUNTER — Other Ambulatory Visit: Payer: Self-pay

## 2022-10-14 ENCOUNTER — Other Ambulatory Visit: Payer: Self-pay

## 2022-10-15 ENCOUNTER — Encounter: Payer: Self-pay | Admitting: Physician Assistant

## 2022-10-15 ENCOUNTER — Ambulatory Visit: Payer: No Typology Code available for payment source | Attending: Physician Assistant | Admitting: Physician Assistant

## 2022-10-15 VITALS — BP 99/68 | HR 91 | Resp 17 | Ht 65.5 in | Wt 114.2 lb

## 2022-10-15 DIAGNOSIS — Z803 Family history of malignant neoplasm of breast: Secondary | ICD-10-CM | POA: Diagnosis not present

## 2022-10-15 DIAGNOSIS — M0579 Rheumatoid arthritis with rheumatoid factor of multiple sites without organ or systems involvement: Secondary | ICD-10-CM

## 2022-10-15 DIAGNOSIS — M24522 Contracture, left elbow: Secondary | ICD-10-CM | POA: Diagnosis not present

## 2022-10-15 DIAGNOSIS — Z862 Personal history of diseases of the blood and blood-forming organs and certain disorders involving the immune mechanism: Secondary | ICD-10-CM

## 2022-10-15 DIAGNOSIS — F32A Depression, unspecified: Secondary | ICD-10-CM

## 2022-10-15 DIAGNOSIS — F17200 Nicotine dependence, unspecified, uncomplicated: Secondary | ICD-10-CM

## 2022-10-15 DIAGNOSIS — Z79899 Other long term (current) drug therapy: Secondary | ICD-10-CM | POA: Diagnosis not present

## 2022-10-15 DIAGNOSIS — R911 Solitary pulmonary nodule: Secondary | ICD-10-CM

## 2022-10-15 DIAGNOSIS — Z96642 Presence of left artificial hip joint: Secondary | ICD-10-CM

## 2022-10-15 DIAGNOSIS — M19211 Secondary osteoarthritis, right shoulder: Secondary | ICD-10-CM | POA: Diagnosis not present

## 2022-10-15 DIAGNOSIS — F419 Anxiety disorder, unspecified: Secondary | ICD-10-CM | POA: Diagnosis not present

## 2022-10-15 DIAGNOSIS — Z111 Encounter for screening for respiratory tuberculosis: Secondary | ICD-10-CM

## 2022-10-15 DIAGNOSIS — D0512 Intraductal carcinoma in situ of left breast: Secondary | ICD-10-CM | POA: Diagnosis not present

## 2022-10-15 DIAGNOSIS — M19212 Secondary osteoarthritis, left shoulder: Secondary | ICD-10-CM

## 2022-10-15 DIAGNOSIS — J438 Other emphysema: Secondary | ICD-10-CM

## 2022-10-15 LAB — CBC WITH DIFFERENTIAL/PLATELET
Absolute Monocytes: 442 cells/uL (ref 200–950)
Basophils Absolute: 51 cells/uL (ref 0–200)
Basophils Relative: 1.1 %
Eosinophils Absolute: 101 cells/uL (ref 15–500)
Eosinophils Relative: 2.2 %
HCT: 46 % — ABNORMAL HIGH (ref 35.0–45.0)
Hemoglobin: 15.2 g/dL (ref 11.7–15.5)
Lymphs Abs: 1380 cells/uL (ref 850–3900)
MCH: 29.9 pg (ref 27.0–33.0)
MCHC: 33 g/dL (ref 32.0–36.0)
MCV: 90.6 fL (ref 80.0–100.0)
MPV: 10.2 fL (ref 7.5–12.5)
Monocytes Relative: 9.6 %
Neutro Abs: 2627 cells/uL (ref 1500–7800)
Neutrophils Relative %: 57.1 %
Platelets: 197 10*3/uL (ref 140–400)
RBC: 5.08 10*6/uL (ref 3.80–5.10)
RDW: 13.6 % (ref 11.0–15.0)
Total Lymphocyte: 30 %
WBC: 4.6 10*3/uL (ref 3.8–10.8)

## 2022-10-15 NOTE — Patient Instructions (Signed)

## 2022-10-16 NOTE — Progress Notes (Signed)
CMP WNL. CBC WNL.

## 2022-10-20 NOTE — Progress Notes (Signed)
TB gold negative

## 2022-10-29 ENCOUNTER — Other Ambulatory Visit: Payer: Self-pay

## 2022-10-30 ENCOUNTER — Other Ambulatory Visit (HOSPITAL_COMMUNITY): Payer: Self-pay

## 2022-11-12 ENCOUNTER — Other Ambulatory Visit (HOSPITAL_COMMUNITY): Payer: Self-pay

## 2022-11-12 ENCOUNTER — Other Ambulatory Visit: Payer: Self-pay

## 2022-11-24 ENCOUNTER — Other Ambulatory Visit: Payer: Self-pay

## 2022-11-25 ENCOUNTER — Other Ambulatory Visit (HOSPITAL_COMMUNITY): Payer: Self-pay

## 2022-11-25 ENCOUNTER — Other Ambulatory Visit: Payer: Self-pay

## 2022-12-03 ENCOUNTER — Other Ambulatory Visit (HOSPITAL_COMMUNITY): Payer: Self-pay

## 2022-12-03 ENCOUNTER — Other Ambulatory Visit: Payer: Self-pay

## 2022-12-03 NOTE — Progress Notes (Signed)
Specialty Pharmacy Refill Coordination Note  [name] is a 62 y.o. female contacted today regarding refills of specialty medication(s) Etanercept .  Patient requested Delivery  on 12/10/22  to verified address [address]   [place] [id number]   Medication will be filled on 12/09/22.

## 2022-12-08 ENCOUNTER — Other Ambulatory Visit (HOSPITAL_COMMUNITY): Payer: Self-pay

## 2022-12-08 DIAGNOSIS — Z681 Body mass index (BMI) 19 or less, adult: Secondary | ICD-10-CM | POA: Diagnosis not present

## 2022-12-08 DIAGNOSIS — Z008 Encounter for other general examination: Secondary | ICD-10-CM | POA: Diagnosis not present

## 2022-12-08 DIAGNOSIS — D84821 Immunodeficiency due to drugs: Secondary | ICD-10-CM | POA: Diagnosis not present

## 2022-12-08 DIAGNOSIS — F1410 Cocaine abuse, uncomplicated: Secondary | ICD-10-CM | POA: Diagnosis not present

## 2022-12-08 DIAGNOSIS — F17211 Nicotine dependence, cigarettes, in remission: Secondary | ICD-10-CM | POA: Diagnosis not present

## 2022-12-08 DIAGNOSIS — I700 Atherosclerosis of aorta: Secondary | ICD-10-CM | POA: Diagnosis not present

## 2022-12-08 DIAGNOSIS — F324 Major depressive disorder, single episode, in partial remission: Secondary | ICD-10-CM | POA: Diagnosis not present

## 2022-12-22 ENCOUNTER — Ambulatory Visit: Payer: Self-pay

## 2022-12-22 NOTE — Telephone Encounter (Signed)
Patient had visit  with  NP. They suggested that she try a new med that can reduce plaque in her heart. Patient is unsure of the name of medication , but well have it at the time of her appointment.   Patient would like to discuss getting on this medication with her PCP.

## 2022-12-22 NOTE — Telephone Encounter (Signed)
     Chief Complaint: Pt. Wants to be seen to discuss "plaques in my heart." Has COPD and SOB "that goes along with it." No SOB today. Requests appointment 01/06/23 with any provider. Symptoms: n/a Frequency: n/a Pertinent Negatives: Patient denies  Disposition: [] ED /[] Urgent Care (no appt availability in office) / [] Appointment(In office/virtual)/ []  [facility]/ [] Home Care/ [] Refused Recommended Disposition /[] [place] [facility]/ [x]  Follow-up with PCP Additional Notes: Please advise pt.  Reason for Disposition  [1] MILD longstanding difficulty breathing AND [2]  SAME as normal  Answer Assessment - Initial Assessment Questions 1. RESPIRATORY STATUS: "Describe your breathing?" (e.g., wheezing, shortness of breath, unable to speak, severe coughing)      SOB 2. ONSET: "When did this breathing problem begin?"      I have COPD 3. PATTERN "Does the difficult breathing come and go, or has it been constant since it started?"      Comes and goes 4. SEVERITY: "How bad is your breathing?" (e.g., mild, moderate, severe)    - MILD: No SOB at rest, mild SOB with walking, speaks normally in sentences, can lie down, no retractions, pulse < 100.    - MODERATE: SOB at rest, SOB with minimal exertion and prefers to sit, cannot lie down flat, speaks in phrases, mild retractions, audible wheezing, pulse 100-120.    - SEVERE: Very SOB at rest, speaks in single words, struggling to breathe, sitting hunched forward, retractions, pulse > 120      Mild-moderate 5. RECURRENT SYMPTOM: "Have you had difficulty breathing before?" If Yes, ask: "When was the last time?" and "What happened that time?"      Yes 6. CARDIAC HISTORY: "Do you have any history of heart disease?" (e.g., heart attack, angina, bypass surgery, angioplasty)      No 7. LUNG HISTORY: "Do you have any history of lung disease?"  (e.g., pulmonary embolus, asthma, emphysema)     COPD 8. CAUSE: "What do you think is causing the  breathing problem?"      COPD 9. OTHER SYMPTOMS: "Do you have any other symptoms? (e.g., dizziness, runny nose, cough, chest pain, fever)     No 10. O2 SATURATION MONITOR:  "Do you use an oxygen saturation monitor (pulse oximeter) at home?" If Yes, ask: "What is your reading (oxygen level) today?" "What is your usual oxygen saturation reading?" (e.g., 95%)       No 11. PREGNANCY: "Is there any chance you are pregnant?" "When was your last menstrual period?"       No 12. TRAVEL: "Have you traveled out of the country in the last month?" (e.g., travel history, exposures)       No  Protocols used: Breathing Difficulty-A-AH

## 2022-12-22 NOTE — Telephone Encounter (Signed)
Call placed to patient unable to reach message left on VM.   

## 2022-12-25 ENCOUNTER — Other Ambulatory Visit (HOSPITAL_COMMUNITY): Payer: Self-pay

## 2022-12-25 ENCOUNTER — Other Ambulatory Visit: Payer: Self-pay

## 2022-12-25 ENCOUNTER — Other Ambulatory Visit: Payer: Self-pay | Admitting: Physician Assistant

## 2022-12-25 DIAGNOSIS — M0579 Rheumatoid arthritis with rheumatoid factor of multiple sites without organ or systems involvement: Secondary | ICD-10-CM

## 2022-12-25 MED ORDER — OTREXUP 20 MG/0.4ML ~~LOC~~ SOAJ
20.0000 mg | SUBCUTANEOUS | 2 refills | Status: DC
Start: 2022-12-25 — End: 2023-04-08
  Filled 2022-12-25: qty 1.6, 28d supply, fill #0
  Filled 2023-02-11: qty 1.6, 28d supply, fill #1
  Filled 2023-03-09: qty 1.6, 28d supply, fill #2

## 2022-12-25 NOTE — Telephone Encounter (Signed)
Please schedule patient a follow up visit. Patient due January 2025. Thanks!   Follow-Up Instructions: Return in about 5 months (around 03/17/2023) for Rheumatoid arthritis.

## 2022-12-25 NOTE — Telephone Encounter (Signed)
Last Fill: 07/16/2022  Labs: 10/15/2022 CMP WNL.   CBC WNL  Next Visit: Due January 2025. Message sent to the front to schedule.   Last Visit: 10/15/2022  DX: Rheumatoid arthritis involving multiple sites with positive rheumatoid factor (   Current Dose per office note 10/15/2022: Otrexup 20 mg sq injections once weekly   Okay to refill Otrexup?

## 2022-12-25 NOTE — Telephone Encounter (Signed)
Attempted to contact patient and left message to advise patient to call the office and schedule patient appointment.

## 2022-12-29 ENCOUNTER — Other Ambulatory Visit: Payer: Self-pay

## 2022-12-31 ENCOUNTER — Other Ambulatory Visit: Payer: Self-pay

## 2023-01-02 ENCOUNTER — Other Ambulatory Visit: Payer: Self-pay | Admitting: *Deleted

## 2023-01-02 MED ORDER — PREDNISONE 5 MG PO TABS
ORAL_TABLET | ORAL | 0 refills | Status: DC
Start: 2023-01-02 — End: 2023-05-30

## 2023-01-02 NOTE — Addendum Note (Signed)
Addended by: Henriette Combs on: 01/02/2023 11:21 AM   Modules accepted: Orders

## 2023-01-02 NOTE — Telephone Encounter (Signed)
Patient contacted the office stating she is having pain in her arm. Patient states it started bothering her yesterday. Patient states her elbow has a knot on it. Patient states this happens every now and then. Patient states she is on Enbrel and Otrexup SQ weekly. Patient states she has not missed any doses. Patient is requesting a prescription for Prednisone to be sent to [facility].

## 2023-01-02 NOTE — Telephone Encounter (Signed)
Left message to advise patient we will to send in prednisone 20 mg tapering by 5 mg every 4 days. Take prednisone in the morning with food and avoid the use of NSAIDs

## 2023-01-02 NOTE — Telephone Encounter (Signed)
Ok to send in prednisone 20 mg tapering by 5 mg every 4 days.  Take prednisone in the morning with food and avoid the use of NSAIDs.

## 2023-01-06 ENCOUNTER — Ambulatory Visit: Payer: No Typology Code available for payment source | Admitting: Internal Medicine

## 2023-01-06 ENCOUNTER — Inpatient Hospital Stay: Payer: No Typology Code available for payment source | Attending: Internal Medicine | Admitting: Nurse Practitioner

## 2023-01-12 ENCOUNTER — Other Ambulatory Visit (HOSPITAL_COMMUNITY): Payer: Self-pay

## 2023-01-12 ENCOUNTER — Other Ambulatory Visit: Payer: Self-pay

## 2023-01-13 ENCOUNTER — Ambulatory Visit: Payer: No Typology Code available for payment source | Admitting: Internal Medicine

## 2023-01-13 ENCOUNTER — Other Ambulatory Visit (HOSPITAL_COMMUNITY): Payer: Self-pay

## 2023-01-13 ENCOUNTER — Other Ambulatory Visit: Payer: Self-pay

## 2023-01-13 ENCOUNTER — Telehealth: Payer: Self-pay | Admitting: *Deleted

## 2023-01-13 DIAGNOSIS — M0579 Rheumatoid arthritis with rheumatoid factor of multiple sites without organ or systems involvement: Secondary | ICD-10-CM

## 2023-01-13 NOTE — Telephone Encounter (Signed)
Ok to place referral to water aerobics

## 2023-01-13 NOTE — Telephone Encounter (Signed)
Referral placed.

## 2023-01-13 NOTE — Addendum Note (Signed)
Addended by: Henriette Combs on: 01/13/2023 03:09 PM   Modules accepted: Orders

## 2023-01-13 NOTE — Telephone Encounter (Signed)
Patient contacted the office stating she would like to have an order for Water Aerobics at [facility] for her Rheumatoid Arthritis. Patient states her Aerobic Instructor at the [facility] advised her that she would be allowed to go there if she has an order. Please advise.

## 2023-01-16 DIAGNOSIS — M9901 Segmental and somatic dysfunction of cervical region: Secondary | ICD-10-CM | POA: Diagnosis not present

## 2023-01-16 DIAGNOSIS — M9902 Segmental and somatic dysfunction of thoracic region: Secondary | ICD-10-CM | POA: Diagnosis not present

## 2023-01-16 DIAGNOSIS — M542 Cervicalgia: Secondary | ICD-10-CM | POA: Diagnosis not present

## 2023-01-16 DIAGNOSIS — M546 Pain in thoracic spine: Secondary | ICD-10-CM | POA: Diagnosis not present

## 2023-01-19 DIAGNOSIS — M542 Cervicalgia: Secondary | ICD-10-CM | POA: Diagnosis not present

## 2023-01-19 DIAGNOSIS — M9901 Segmental and somatic dysfunction of cervical region: Secondary | ICD-10-CM | POA: Diagnosis not present

## 2023-01-19 DIAGNOSIS — M9902 Segmental and somatic dysfunction of thoracic region: Secondary | ICD-10-CM | POA: Diagnosis not present

## 2023-01-19 DIAGNOSIS — M546 Pain in thoracic spine: Secondary | ICD-10-CM | POA: Diagnosis not present

## 2023-01-21 DIAGNOSIS — M9902 Segmental and somatic dysfunction of thoracic region: Secondary | ICD-10-CM | POA: Diagnosis not present

## 2023-01-21 DIAGNOSIS — M9901 Segmental and somatic dysfunction of cervical region: Secondary | ICD-10-CM | POA: Diagnosis not present

## 2023-01-21 DIAGNOSIS — M542 Cervicalgia: Secondary | ICD-10-CM | POA: Diagnosis not present

## 2023-01-21 DIAGNOSIS — M546 Pain in thoracic spine: Secondary | ICD-10-CM | POA: Diagnosis not present

## 2023-01-23 DIAGNOSIS — M9902 Segmental and somatic dysfunction of thoracic region: Secondary | ICD-10-CM | POA: Diagnosis not present

## 2023-01-23 DIAGNOSIS — M9901 Segmental and somatic dysfunction of cervical region: Secondary | ICD-10-CM | POA: Diagnosis not present

## 2023-01-23 DIAGNOSIS — M542 Cervicalgia: Secondary | ICD-10-CM | POA: Diagnosis not present

## 2023-01-23 DIAGNOSIS — M546 Pain in thoracic spine: Secondary | ICD-10-CM | POA: Diagnosis not present

## 2023-01-26 DIAGNOSIS — M546 Pain in thoracic spine: Secondary | ICD-10-CM | POA: Diagnosis not present

## 2023-01-26 DIAGNOSIS — M9901 Segmental and somatic dysfunction of cervical region: Secondary | ICD-10-CM | POA: Diagnosis not present

## 2023-01-26 DIAGNOSIS — M542 Cervicalgia: Secondary | ICD-10-CM | POA: Diagnosis not present

## 2023-01-26 DIAGNOSIS — M9902 Segmental and somatic dysfunction of thoracic region: Secondary | ICD-10-CM | POA: Diagnosis not present

## 2023-01-27 ENCOUNTER — Encounter: Payer: Self-pay | Admitting: Internal Medicine

## 2023-01-27 ENCOUNTER — Inpatient Hospital Stay: Payer: No Typology Code available for payment source | Attending: Internal Medicine | Admitting: Internal Medicine

## 2023-01-27 ENCOUNTER — Other Ambulatory Visit: Payer: Self-pay

## 2023-01-27 ENCOUNTER — Other Ambulatory Visit (HOSPITAL_COMMUNITY): Payer: Self-pay

## 2023-01-27 VITALS — BP 119/67 | HR 109 | Temp 97.8°F | Ht 65.5 in | Wt 114.8 lb

## 2023-01-27 DIAGNOSIS — R5383 Other fatigue: Secondary | ICD-10-CM | POA: Insufficient documentation

## 2023-01-27 DIAGNOSIS — Z79810 Long term (current) use of selective estrogen receptor modulators (SERMs): Secondary | ICD-10-CM | POA: Diagnosis not present

## 2023-01-27 DIAGNOSIS — M069 Rheumatoid arthritis, unspecified: Secondary | ICD-10-CM | POA: Insufficient documentation

## 2023-01-27 DIAGNOSIS — Z87891 Personal history of nicotine dependence: Secondary | ICD-10-CM | POA: Insufficient documentation

## 2023-01-27 DIAGNOSIS — Z803 Family history of malignant neoplasm of breast: Secondary | ICD-10-CM | POA: Insufficient documentation

## 2023-01-27 DIAGNOSIS — Z8049 Family history of malignant neoplasm of other genital organs: Secondary | ICD-10-CM | POA: Insufficient documentation

## 2023-01-27 DIAGNOSIS — Z79631 Long term (current) use of antimetabolite agent: Secondary | ICD-10-CM | POA: Diagnosis not present

## 2023-01-27 DIAGNOSIS — Z79899 Other long term (current) drug therapy: Secondary | ICD-10-CM | POA: Diagnosis not present

## 2023-01-27 DIAGNOSIS — Z170 Estrogen receptor positive status [ER+]: Secondary | ICD-10-CM | POA: Insufficient documentation

## 2023-01-27 DIAGNOSIS — C50412 Malignant neoplasm of upper-outer quadrant of left female breast: Secondary | ICD-10-CM | POA: Insufficient documentation

## 2023-01-27 DIAGNOSIS — Z801 Family history of malignant neoplasm of trachea, bronchus and lung: Secondary | ICD-10-CM | POA: Insufficient documentation

## 2023-01-27 MED ORDER — TAMOXIFEN CITRATE 20 MG PO TABS
20.0000 mg | ORAL_TABLET | Freq: Every day | ORAL | 1 refills | Status: DC
  Filled 2023-01-27: qty 90, 90d supply, fill #0
  Filled 2023-09-10: qty 90, 90d supply, fill #1

## 2023-01-27 NOTE — Progress Notes (Signed)
Is she due for her breast and lung scans?

## 2023-01-27 NOTE — Assessment & Plan Note (Addendum)
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  Status postlumpectomy followed by radiation.  No chemotherapy.  On adjuvant-tamoxifen 20 mg qhs [NOV, 01/2021- NOV 2027].  Patient tolerated tamoxifen well.  Stable. DEC 2023-  mammo- BIL- WNL.  # Incidental left axillary lymphadenopathy [on lung cancer screening]; UNABLE-to perform biopsy left axilla lymph node [MAY/ June 12th, 2023]-because of blood vessels. JUNE 2024- [place] Left underarm stable-  Recommend BILATERAL diagnostic mammogram and LEFT axillary ultrasound end of dec 2024;    #Lung cancer screening: SEP 2023- [facility] 2- continue follow up in 12 months/PCP- stable.   #Rheumatoid arthritis: [Dr.Deweshwer; [place]]-methotrexate/; [facility]  # DISPOSITION: # BILATERAL diagnostic mammogram and LEFT axillary ultrasound in end of dec 2024.  #  follow up in 6 months; MD;  no labs- Dr.B

## 2023-01-27 NOTE — Progress Notes (Signed)
[facility] CONSULT NOTE  Patient Care Team: Marcine Matar, MD as PCP - General (Internal Medicine) Griselda Miner, MD as Consulting Physician (General Surgery) Pollyann Savoy, MD as Consulting Physician (Rheumatology) Sallee Lange, LCSW (Inactive) as Social Worker (General Practice) Rachel Bo as Physician Assistant (Physician Assistant) Earna Coder, MD as Consulting Physician (Oncology)  CHIEF COMPLAINTS/PURPOSE OF CONSULTATION: Breast cancer  #  Oncology History Overview Note  2021- FINAL MICROSCOPIC DIAGNOSIS:   A. BREAST, LEFT, LUMPECTOMY:  - Invasive ductal carcinoma, 1.5 cm.  - Ductal carcinoma in situ with calcifications.  - Invasive carcinoma 0.4 cm from posterior margin.  - DCIS focally involves inferior margin.  - Fibrocystic changes with sclerosing adenosis and calcifications.  - See oncology table.   B. LYMPH NODE, LEFT AXILLARY, SENTINEL, BIOPSY:  - One lymph node with no metastatic carcinoma (0/1).   C. LYMPH NODE, LEFT AXILLARY, SENTINEL, BIOPSY:  - One lymph node with no metastatic carcinoma (0/1).   D. LYMPH NODE, LEFT AXILLARY, SENTINEL, BIOPSY:  - One lymph node with no metastatic carcinoma (0/1).   E. LYMPH NODE, LEFT AXILLARY, SENTINEL, BIOPSY:  - One lymph node with no metastatic carcinoma (0/1).   F. BREAST, LEFT ADDITIONAL MEDIAL MARGIN, EXCISION:  - Fibrocystic changes with sclerosing adenosis and calcifications.  - No malignancy identified.  - Final medial margin clear.   G. BREAST, LEFT ADDITIONAL SUPERIOR MARGIN, EXCISION:  - Fibrocystic changes with sclerosing adenosis and calcifications.  - No malignancy identified.  - Final superior margin clear.   ONCOLOGY TABLE:  INVASIVE CARCINOMA OF THE BREAST:  Resection  Procedure: Left breast lumpectomy, 4 left axillary sentinel lymph nodes  with additional medial margin and additional superior margin biopsies.  Specimen Laterality: Left breast.   Tumor Size: 1.5 cm.  Histologic Type: Ductal.  Histologic Grade:       Glandular (Acinar)/Tubular Differentiation: 1.       Nuclear Pleomorphism: 1.       Mitotic Rate: 1.       Overall Grade: 1  Ductal Carcinoma In Situ: Present.  Margins: Free of invasive carcinoma.       Distance from closest margin (millimeters): 4 mm.       Specify closest margin (required only if <67mm): Posterior.  DCIS Margins: Focally involves inferior margin.       Distance from closest margin (millimeters): 0 mm.       Specify closest margin (required only if <51mm): Inferior.  Regional Lymph Nodes:       Number of Lymph Nodes Examined: 4       Number of Sentinel Nodes Examined (if applicable): 4       Number of Lymph Nodes with Macrometastases (>2 mm): 0       Number of Lymph Nodes with Micrometastases: 0       Number of Lymph Nodes with Isolated Tumor Cells (=0.2 mm or =200  cells): 0  Treatment Effect:  No known presurgical therapy  Breast Biomarker Testing Performed on Previous Biopsy:        Testing Performed on Case Number: [id number].             Estrogen Receptor: 95%, positive, strong staining.             Progesterone Receptor: 2%, positive, strong staining.             HER2: Negative with IHC.  Ki-67: 1%.    [name]  06/13/2019 Cancer Staging   Staging form: Breast, AJCC 8th Edition - Clinical stage from 06/13/2019: Stage 0 (cTis (DCIS), cN0, cM0, ER+, PR+)   06/15/2019 Initial Diagnosis   [name]   Malignant neoplasm of upper-outer quadrant of left breast in female, estrogen receptor positive (HCC)  09/15/2019 Initial Diagnosis   Malignant neoplasm of upper-outer quadrant of left breast in female, estrogen receptor positive (HCC)      HISTORY OF PRESENTING ILLNESS: Alone.  Ambulating independently. Joellyn Rued 62 y.o.  female with a history of LEFT breast cancer stage I  ER/PR positive HER2  negative; history of smoking; longstanding anxiety/depression here for follow-up/review her lymph node left axillary is here for follow-up/review results of the ultrasound of the left underarm.  Patient states no concerns.  Patient denies any new onset of any lumps or bumps.  Denies any unusual shortness of breath or cough.  She is gaining weight.  She seems to be tolerating tamoxifen fairly well.  Review of Systems  Constitutional:  Positive for malaise/fatigue. Negative for chills, diaphoresis, fever and weight loss.  HENT:  Negative for nosebleeds and sore throat.   Eyes:  Negative for double vision.  Respiratory:  Negative for cough, hemoptysis, sputum production, shortness of breath and wheezing.   Cardiovascular:  Negative for chest pain, palpitations, orthopnea and leg swelling.  Gastrointestinal:  Negative for abdominal pain, blood in stool, constipation, diarrhea, heartburn, melena, nausea and vomiting.  Genitourinary:  Negative for dysuria, frequency and urgency.  Musculoskeletal:  Positive for back pain, joint pain and neck pain.  Skin: Negative.  Negative for itching and rash.  Neurological:  Negative for dizziness, tingling, focal weakness, weakness and headaches.  Endo/Heme/Allergies:  Does not bruise/bleed easily.  Psychiatric/Behavioral:  Negative for depression. The patient is nervous/anxious. The patient does not have insomnia.      MEDICAL HISTORY:  Past Medical History:  Diagnosis Date   Anemia    during pregnancy   Anxiety    Asthma    Bronchitis age 33   Cancer (HCC)    left breast ca   COPD (chronic obstructive pulmonary disease) (HCC)    Depression    Hx of bladder infections    been over 5 years since last one    Malignant neoplasm of upper-outer quadrant of left breast in female, estrogen receptor positive (HCC)    Rheumatoid arthritis (HCC)    unc rheum    Tobacco abuse    Trouble in sleeping     SURGICAL HISTORY: Past Surgical History:   Procedure Laterality Date   BREAST LUMPECTOMY WITH RADIOACTIVE SEED AND SENTINEL LYMPH NODE BIOPSY Left 08/30/2019   Procedure: LEFT BREAST LUMPECTOMY X 2 WITH RADIOACTIVE SEED AND SENTINEL LYMPH NODE BIOPSY;  Surgeon: Griselda Miner, MD;  Location: [facility] OR;  Service: General;  Laterality: Left;  PEC BLOCK   COLONOSCOPY WITH PROPOFOL N/A 03/15/2019   Procedure: COLONOSCOPY WITH PROPOFOL;  Surgeon: Wyline Mood, MD;  Location: [facility] ENDOSCOPY;  Service: Gastroenterology;  Laterality: N/A;   COLONOSCOPY WITH PROPOFOL N/A 07/29/2022   Procedure: COLONOSCOPY WITH PROPOFOL;  Surgeon: Wyline Mood, MD;  Location: [facility] ENDOSCOPY;  Service: Gastroenterology;  Laterality: N/A;   HIP ARTHROPLASTY     JOINT REPLACEMENT     MULTIPLE TOOTH EXTRACTIONS     RE-EXCISION OF BREAST LUMPECTOMY Left 10/07/2019   Procedure: RE-EXCISION OF LEFT BREAST INFERIOR MARGIN;  Surgeon: Griselda Miner, MD;  Location:  SURGERY CENTER;  Service: General;  Laterality: Left;   TOTAL HIP ARTHROPLASTY Left 02/15/2018   Procedure: TOTAL HIP ARTHROPLASTY ANTERIOR APPROACH;  Surgeon: Gean Birchwood, MD;  Location: [facility] ORS;  Service: Orthopedics;  Laterality: Left;   WISDOM TOOTH EXTRACTION      SOCIAL HISTORY: Social History   Socioeconomic History   Marital status: Single    Spouse name: Not on file   Number of children: 1   Years of education: 14 years   Highest education level: Some college, no degree  Occupational History   Not on file  Tobacco Use   Smoking status: Former    Current packs/day: 0.00    Average packs/day: 1 pack/day for 40.0 years (40.0 ttl pk-yrs)    Types: Cigarettes    Start date: 03/10/1981    Quit date: 03/10/2021    Years since quitting: 1.8    Passive exposure: Past   Smokeless tobacco: Never   Tobacco comments:    on nicotine patches  Vaping Use   Vaping status: Never Used  Substance and Sexual Activity   Alcohol use: Yes    Comment: Occassionally   Drug use: Not Currently    Types:  Cocaine    Comment: "it's been years ago"   Sexual activity: Not Currently    Birth control/protection: Post-menopausal  Other Topics Concern   Not on file  Social History Narrative   Not on file   Social Determinants of Health   Financial Resource Strain: Medium Risk (09/02/2022)   Overall Financial Resource Strain (CARDIA)    Difficulty of Paying Living Expenses: Somewhat hard  Food Insecurity: No Food Insecurity (09/02/2022)   Hunger Vital Sign    Worried About Running Out of Food in the Last Year: Never true    Ran Out of Food in the Last Year: Never true  Transportation Needs: No Transportation Needs (09/02/2022)   PRAPARE - Administrator, Civil Service (Medical): No    Lack of Transportation (Non-Medical): No  Physical Activity: Insufficiently Active (09/02/2022)   Exercise Vital Sign    Days of Exercise per Week: 3 days    Minutes of Exercise per Session: 30 min  Stress: No Stress Concern Present (09/02/2022)   [employer] of Occupational Health - Occupational Stress Questionnaire    Feeling of Stress : Only a little  Social Connections: Socially Isolated (09/02/2022)   Social Connection and Isolation Panel [NHANES]    Frequency of Communication with Friends and Family: Twice a week    Frequency of Social Gatherings with Friends and Family: Twice a week    Attends Religious Services: Never    Database administrator or Organizations: No    Attends Banker Meetings: Never    Marital Status: Divorced  Catering manager Violence: Not At Risk (09/02/2022)   Humiliation, Afraid, Rape, and Kick questionnaire    Fear of Current or Ex-Partner: No    Emotionally Abused: No    Physically Abused: No    Sexually Abused: No    FAMILY HISTORY: Family History  Problem Relation Age of Onset   Breast cancer Mother 82       metastatic   Cervical cancer Mother    Lung cancer Brother        maternal half brother; d. 18s   Lung cancer Maternal Uncle         d. 59s   Lung cancer Maternal Uncle  d. 55   Cancer Maternal Grandmother        unknown type; dx 46s   Healthy Son    Breast cancer Maternal Great-grandmother        dx unknown age    ALLERGIES:  is allergic to trazodone and nefazodone and other.  MEDICATIONS:  Current Outpatient Medications  Medication Sig Dispense Refill   albuterol (PROAIR HFA) 108 (90 Base) MCG/ACT inhaler Inhale 2 puffs into the lungs every 6 (six) hours as needed for wheezing or shortness of breath. 8.5 g 2   Budeson-Glycopyrrol-Formoterol (BREZTRI AEROSPHERE) 160-9-4.8 MCG/ACT AERO Inhale 2 puffs into the lungs 2 (two) times daily. 10.7 g 11   Budeson-Glycopyrrol-Formoterol (BREZTRI AEROSPHERE) 160-9-4.8 MCG/ACT AERO Inhale 2 puffs into the lungs in the morning and at bedtime. 10.7 g 0   Calcium Carb-Cholecalciferol (CALCIUM 1000 + D PO) Take by mouth.     COLLAGEN PO Take by mouth daily.     cyanocobalamin 100 MCG tablet Take 100 mcg by mouth daily.     etanercept (ENBREL MINI) 50 MG/ML injection INJECT 50 MG INTO THE SKIN ONCE A WEEK. 4 mL 2   folic acid (FOLVITE) 1 MG tablet Take 2 tablets (2 mg total) by mouth daily. 180 tablet 3   hydrOXYzine (VISTARIL) 25 MG capsule Take 1 capsule (25 mg total) by mouth at bedtime. (Patient taking differently: Take 25 mg by mouth at bedtime as needed.) 30 capsule 3   methotrexate (RHEUMATREX) 2.5 MG tablet Take 8 tablets (20 mg total) by mouth once a week. Caution:Chemotherapy. Protect from light. 32 tablet 2   Methotrexate, PF, (OTREXUP) 20 MG/0.4ML SOAJ Inject 20 mg into the skin once a week. 1.6 mL 2   Multiple Vitamin (MULTIVITAMIN WITH MINERALS) TABS tablet Take 1 tablet by mouth daily.     nicotine (NICODERM CQ - DOSED IN MG/24 HR) 7 mg/24hr patch Place 1 patch (7 mg total) onto the skin daily. 28 patch 0   VITAMIN D PO Take by mouth daily.     predniSONE (DELTASONE) 10 MG tablet Take 4 tablets each am x 2 days, 2 tablets each am x 2 days, 1 tablet each am x 2  days and stop (Patient not taking: Reported on 10/15/2022) 14 tablet 0   predniSONE (DELTASONE) 5 MG tablet Take 4 tabs po x 4 days, 3  tabs po x 4 days, 2  tabs po x 4 days, 1 tab po x 4 days. Take prednisone in the morning with food and avoid the use of NSAIDs. (Patient not taking: Reported on 10/15/2022) 40 tablet 0   predniSONE (DELTASONE) 5 MG tablet Take 4 tabs po x 4 days, 3  tabs po x 4 days, 2  tabs po x 4 days, 1  tab po x 4 days (Patient not taking: Reported on 01/27/2023) 40 tablet 0   tamoxifen (NOLVADEX) 20 MG tablet Take 1 tablet (20 mg total) by mouth daily. 90 tablet 1   No current facility-administered medications for this visit.    PHYSICAL EXAMINATION:  Vitals:   01/27/23 1422  BP: 119/67  Pulse: (!) 109  Temp: 97.8 F (36.6 C)  SpO2: 93%   Filed Weights   01/27/23 1422  Weight: 114 lb 12.8 oz (52.1 kg)    Physical Exam Vitals and nursing note reviewed.  HENT:     Head: Normocephalic and atraumatic.     Mouth/Throat:     Pharynx: Oropharynx is clear.  Eyes:     Extraocular Movements:  Extraocular movements intact.     Pupils: Pupils are equal, round, and reactive to light.  Cardiovascular:     Rate and Rhythm: Normal rate and regular rhythm.  Pulmonary:     Comments: Decreased breath sounds bilaterally.  Abdominal:     Palpations: Abdomen is soft.  Musculoskeletal:        General: Normal range of motion.     Cervical back: Normal range of motion.  Skin:    General: Skin is warm.  Neurological:     General: No focal deficit present.     Mental Status: She is alert and oriented to person, place, and time.  Psychiatric:        Behavior: Behavior normal.        Judgment: Judgment normal.      LABORATORY DATA:  I have reviewed the data as listed Lab Results  Component Value Date   WBC 4.6 10/15/2022   HGB 15.2 10/15/2022   HCT 46.0 (H) 10/15/2022   MCV 90.6 10/15/2022   PLT 197 10/15/2022   Recent Labs    02/11/22 1430 06/17/22 1604  10/15/22 1507  NA 145 143 140  K 4.8 4.7 4.5  CL 104 105 103  CO2 31 28 30   GLUCOSE 80 97 72  BUN 12 19 14   CREATININE 0.88 0.91 0.76  CALCIUM 9.3 9.5 9.4  PROT 6.7 6.4 6.4  AST 18 18 17   ALT 13 15 15   BILITOT 0.4 0.4 0.7    RADIOGRAPHIC STUDIES: I have personally reviewed the radiological images as listed and agreed with the findings in the report. No results found.  ASSESSMENT & PLAN:   Malignant neoplasm of upper-outer quadrant of left breast in female, estrogen receptor positive (HCC) #ER/PR positive HER2 negative stage I breast cancer-left breast.  Status postlumpectomy followed by radiation.  No chemotherapy.  On adjuvant-tamoxifen 20 mg qhs [NOV, 01/2021- NOV 2027].  Patient tolerated tamoxifen well.  Stable. DEC 2023-  mammo- BIL- WNL.  # Incidental left axillary lymphadenopathy [on lung cancer screening]; UNABLE-to perform biopsy left axilla lymph node [MAY/ June 12th, 2023]-because of blood vessels. JUNE 2024- [place] Left underarm stable-  Recommend BILATERAL diagnostic mammogram and LEFT axillary ultrasound in 6 month   #Lung cancer screening: SEP 2023- Bi Rad 2- continue follow up in 12 months/PCP- stable.   #Rheumatoid arthritis: [Dr.Deweshwer; [place]]-methotrexate/; Biologics  # DISPOSITION: # BILATERAL diagnostic mammogram and LEFT axillary ultrasound in 6 month- end of dec 2024.  #  follow up in 6 months; MD;  no labs- Dr.B   All questions were answered. The patient/family knows to call the clinic with any problems, questions or concerns.     Earna Coder, MD 01/27/2023 2:57 PM

## 2023-01-29 ENCOUNTER — Ambulatory Visit: Payer: No Typology Code available for payment source | Attending: Physician Assistant | Admitting: Physician Assistant

## 2023-01-29 ENCOUNTER — Encounter: Payer: Self-pay | Admitting: Physician Assistant

## 2023-01-29 VITALS — BP 115/75 | HR 102 | Wt 115.0 lb

## 2023-01-29 DIAGNOSIS — Z23 Encounter for immunization: Secondary | ICD-10-CM | POA: Diagnosis not present

## 2023-01-29 DIAGNOSIS — Z711 Person with feared health complaint in whom no diagnosis is made: Secondary | ICD-10-CM | POA: Diagnosis not present

## 2023-01-29 DIAGNOSIS — Z139 Encounter for screening, unspecified: Secondary | ICD-10-CM

## 2023-01-29 NOTE — Progress Notes (Signed)
[name], is a 62 y.o. female  ZOX:[id number]  [id number]:[id number]  DOB - 05/18/1960  No chief complaint on file.      Subjective:   [name] is a 62 y.o. female here today with a question about cardiac calcification.  She had some sort of virtual appt with a Nurse Practitioner from her (?insurance company) that mentioned a calcification on some imaging that showed the heart.  Patient is unable to tell me specifically which imaging or testing that was being referred to.  She is unable to tell me exactly what was recommended.  She does not have any paperwork or written recommendations from the visit.  Seen by Oncology 2 days ago regarding h/o breast CA.  She is UTD with those visits.  I reviewed her CT chest from about 1 year ago and there is no mention of cardiac or vessel calcification.  Cholesterol has always been normal.. she does have h/o smoking and early COPD followed by pulmonology.  No CP/Dizziness/HA.  States she eats very healthy  No problems updated.  ALLERGIES: Allergies  Allergen Reactions   Trazodone And Nefazodone Other (See Comments)    Makes her gittery   Other Rash    Unknown cleaner used in OR    PAST MEDICAL HISTORY: Past Medical History:  Diagnosis Date   Anemia    during pregnancy   Anxiety    Asthma    Bronchitis age 3   Cancer (HCC)    left breast ca   COPD (chronic obstructive pulmonary disease) (HCC)    Depression    Hx of bladder infections    been over 5 years since last one    Malignant neoplasm of upper-outer quadrant of left breast in female, estrogen receptor positive (HCC)    Rheumatoid arthritis (HCC)    unc rheum    Tobacco abuse    Trouble in sleeping     MEDICATIONS AT HOME: Prior to Admission medications   Medication Sig Start Date End Date Taking? Authorizing Provider  albuterol (PROAIR HFA) 108 (90 Base) MCG/ACT inhaler Inhale 2 puffs into the lungs every 6 (six) hours as needed for wheezing or shortness of breath. 10/16/21  Yes  Marcine Matar, MD  Budeson-Glycopyrrol-Formoterol (BREZTRI AEROSPHERE) 160-9-4.8 MCG/ACT AERO Inhale 2 puffs into the lungs 2 (two) times daily. 02/28/22  Yes Nyoka Cowden, MD  Budeson-Glycopyrrol-Formoterol (BREZTRI AEROSPHERE) 160-9-4.8 MCG/ACT AERO Inhale 2 puffs into the lungs in the morning and at bedtime. 02/28/22  Yes Nyoka Cowden, MD  Calcium Carb-Cholecalciferol (CALCIUM 1000 + D PO) Take by mouth.   Yes [provider]  COLLAGEN PO Take by mouth daily.   Yes [provider]  cyanocobalamin 100 MCG tablet Take 100 mcg by mouth daily.   Yes [provider]  etanercept (ENBREL MINI) 50 MG/ML injection INJECT 50 MG INTO THE SKIN ONCE A WEEK. 07/16/22 07/16/23 Yes Deveshwar, Janalyn Rouse, MD  folic acid (FOLVITE) 1 MG tablet Take 2 tablets (2 mg total) by mouth daily. 02/24/22  Yes Gearldine Bienenstock, PA-C  hydrOXYzine (VISTARIL) 25 MG capsule Take 1 capsule (25 mg total) by mouth at bedtime. Patient taking differently: Take 25 mg by mouth at bedtime as needed. 06/17/22  Yes Marcine Matar, MD  methotrexate (RHEUMATREX) 2.5 MG tablet Take 8 tablets (20 mg total) by mouth once a week. Caution:Chemotherapy. Protect from light. 10/09/21  Yes Deveshwar, Janalyn Rouse, MD  Methotrexate, PF, (OTREXUP) 20 MG/0.4ML SOAJ Inject 20 mg into the skin once  a week. 12/25/22  Yes Gearldine Bienenstock, PA-C  Multiple Vitamin (MULTIVITAMIN WITH MINERALS) TABS tablet Take 1 tablet by mouth daily.   Yes [provider]  nicotine (NICODERM CQ - DOSED IN MG/24 HR) 7 mg/24hr patch Place 1 patch (7 mg total) onto the skin daily. 06/17/22  Yes Marcine Matar, MD  predniSONE (DELTASONE) 10 MG tablet Take 4 tablets each am x 2 days, 2 tablets each am x 2 days, 1 tablet each am x 2 days and stop 06/26/22  Yes Nyoka Cowden, MD  predniSONE (DELTASONE) 5 MG tablet Take 4 tabs po x 4 days, 3  tabs po x 4 days, 2  tabs po x 4 days, 1 tab po x 4 days. Take prednisone in the morning with food and avoid the  use of NSAIDs. 09/05/22  Yes Gearldine Bienenstock, PA-C  predniSONE (DELTASONE) 5 MG tablet Take 4 tabs po x 4 days, 3  tabs po x 4 days, 2  tabs po x 4 days, 1  tab po x 4 days 01/02/23  Yes Gearldine Bienenstock, PA-C  tamoxifen (NOLVADEX) 20 MG tablet Take 1 tablet (20 mg total) by mouth daily. 01/27/23  Yes Earna Coder, MD  VITAMIN D PO Take by mouth daily.   Yes [provider]    ROS: Neg HEENT Neg resp Neg cardiac Neg GI Neg GU Neg MS Neg psych Neg neuro  Objective:   Vitals:   01/29/23 1344  BP: 115/75  Pulse: (!) 102  SpO2: 92%  Weight: 115 lb (52.2 kg)   Exam General appearance : Awake, alert, not in any distress. Speech Clear. Not toxic looking;  very thin HEENT: Atraumatic and Normocephalic Neck: Supple, no JVD. No cervical lymphadenopathy.  Chest: fair air entry bilaterally.   No rales/rhonchi.  Minimal wheezing CVS: S1 S2 regular, no murmurs.  Extremities: B/L Lower Ext shows no edema, both legs are warm to touch Neurology: Awake alert, and oriented X 3, CN II-XII intact, Non focal Skin: No Rash  Data Review No results found for: "HGBA1C"  Assessment & Plan   1. Concern about cardiovascular disease without diagnosis Unclear.  Most recent imaging that shows chest is CT chest from about a year ago that does not show any calcifications of the heart. She will see if she can get info from the virtual visit and pass it along to me or Dr Laural Benes to see if we can get a more clear idea of/if anything additional is needed.  CXR and chest CT from 2023 reviewed.  Cholesterol also normal 2023  2. Needs flu shot - Flu vaccine trivalent PF, 6mos and older(Flulaval,Afluria,Fluarix,Fluzone)    Return if symptoms worsen or fail to improve.  The patient was given clear instructions to go to ER or return to medical center if symptoms don't improve, worsen or new problems develop. The patient verbalized understanding. The patient was told to call to get lab results if  they haven't heard anything in the next week.      Georgian Co, PA-C [facility] [place], [place] [phone]   01/29/2023, 2:05 PM

## 2023-01-30 NOTE — Addendum Note (Signed)
Addended by: Dalene Carrow I on: 01/30/2023 11:54 AM   Modules accepted: Orders

## 2023-02-02 ENCOUNTER — Telehealth: Payer: Self-pay | Admitting: Internal Medicine

## 2023-02-02 ENCOUNTER — Telehealth: Payer: Self-pay | Admitting: Physician Assistant

## 2023-02-02 ENCOUNTER — Telehealth: Payer: Self-pay | Admitting: *Deleted

## 2023-02-02 ENCOUNTER — Other Ambulatory Visit: Payer: Self-pay | Admitting: *Deleted

## 2023-02-02 DIAGNOSIS — M542 Cervicalgia: Secondary | ICD-10-CM | POA: Diagnosis not present

## 2023-02-02 DIAGNOSIS — M9902 Segmental and somatic dysfunction of thoracic region: Secondary | ICD-10-CM | POA: Diagnosis not present

## 2023-02-02 DIAGNOSIS — M546 Pain in thoracic spine: Secondary | ICD-10-CM | POA: Diagnosis not present

## 2023-02-02 DIAGNOSIS — Z79899 Other long term (current) drug therapy: Secondary | ICD-10-CM

## 2023-02-02 DIAGNOSIS — M9901 Segmental and somatic dysfunction of cervical region: Secondary | ICD-10-CM | POA: Diagnosis not present

## 2023-02-02 DIAGNOSIS — Z122 Encounter for screening for malignant neoplasm of respiratory organs: Secondary | ICD-10-CM

## 2023-02-02 NOTE — Telephone Encounter (Signed)
Lung Cancer Screening Overdue since 01/22/2023 Please advise

## 2023-02-02 NOTE — Telephone Encounter (Signed)
Order submitted for CT scan of the chest for lung cancer screening 1 year follow-up.

## 2023-02-02 NOTE — Addendum Note (Signed)
Addended by: Jonah Blue B on: 02/02/2023 06:00 PM   Modules accepted: Orders

## 2023-02-02 NOTE — Progress Notes (Signed)
  Care Coordination   Note   02/02/2023 Name: [name] MRN: [id number] DOB: 30-Apr-1960  [name] is a 62 y.o. year old female who sees Marcine Matar, MD for primary care. I reached out to Joellyn Rued by phone today to offer care coordination services.  Ms. Attridge was given information about Care Coordination services today including:   The Care Coordination services include support from the care team which includes your Nurse Coordinator, Clinical Social Worker, or Pharmacist.  The Care Coordination team is here to help remove barriers to the health concerns and goals most important to you. Care Coordination services are voluntary, and the patient may decline or stop services at any time by request to their care team member.   Care Coordination Consent Status: Patient agreed to services and verbal consent obtained.   Follow up plan:  Telephone appointment with care coordination team member scheduled for:  12/10  Encounter Outcome:  Patient Scheduled  [facility] Coordination Care Guide  Direct Dial: [phone]

## 2023-02-02 NOTE — Telephone Encounter (Signed)
Pt called wanting a referral sent to a dermatologist in [place]. Pt states we have sent one before but she declined at the time.

## 2023-02-02 NOTE — Telephone Encounter (Signed)
Pt is calling to ask if she is due for lung screen.  Please advise  CB- [phone]

## 2023-02-03 ENCOUNTER — Telehealth: Payer: Self-pay | Admitting: Rheumatology

## 2023-02-03 NOTE — Telephone Encounter (Signed)
Pt called returning sharon's message. Let pt know I would send a message back as well. Pt states it was in regards to her referral to [facility].

## 2023-02-03 NOTE — Telephone Encounter (Signed)
Called but no answer. LVM to call back.  

## 2023-02-03 NOTE — Telephone Encounter (Signed)
I called patient, patient took insurance info to [facility] x 2 weeks, no appt has been scheduled.

## 2023-02-04 ENCOUNTER — Telehealth: Payer: Self-pay | Admitting: Internal Medicine

## 2023-02-04 DIAGNOSIS — M542 Cervicalgia: Secondary | ICD-10-CM | POA: Diagnosis not present

## 2023-02-04 DIAGNOSIS — M9901 Segmental and somatic dysfunction of cervical region: Secondary | ICD-10-CM | POA: Diagnosis not present

## 2023-02-04 DIAGNOSIS — M9902 Segmental and somatic dysfunction of thoracic region: Secondary | ICD-10-CM | POA: Diagnosis not present

## 2023-02-04 DIAGNOSIS — M546 Pain in thoracic spine: Secondary | ICD-10-CM | POA: Diagnosis not present

## 2023-02-04 NOTE — Telephone Encounter (Signed)
Called but no answer. LVM informing patient that order has been placed.

## 2023-02-04 NOTE — Telephone Encounter (Signed)
PT did not want to drive to [facility] or [place] so she req a [address] Monna Fam Dr. Sherene Sires very much.

## 2023-02-10 ENCOUNTER — Ambulatory Visit
Admission: RE | Admit: 2023-02-10 | Discharge: 2023-02-10 | Disposition: A | Discharge status: Home / Self Care | Payer: No Typology Code available for payment source | Source: Ambulatory Visit | Attending: Internal Medicine | Admitting: Internal Medicine

## 2023-02-10 DIAGNOSIS — Z87891 Personal history of nicotine dependence: Secondary | ICD-10-CM | POA: Diagnosis not present

## 2023-02-10 DIAGNOSIS — Z122 Encounter for screening for malignant neoplasm of respiratory organs: Secondary | ICD-10-CM

## 2023-02-10 NOTE — Telephone Encounter (Signed)
PT did not want to drive to [facility] or [place] so she req a [address] Monna Fam Dr. Sherene Sires very much.   Is there a recommendation?

## 2023-02-10 NOTE — Telephone Encounter (Signed)
If no NPs going there anymore I really don't have a specific suggestion (don't know the doctors as well as the NPs)  but again happy to try to see her on one of my offices at least yearly basis.

## 2023-02-11 ENCOUNTER — Other Ambulatory Visit: Payer: Self-pay

## 2023-02-11 ENCOUNTER — Other Ambulatory Visit (HOSPITAL_COMMUNITY): Payer: Self-pay

## 2023-02-11 ENCOUNTER — Telehealth: Payer: Self-pay | Admitting: Rheumatology

## 2023-02-11 NOTE — Telephone Encounter (Signed)
Pt called trying to reach Jasmine December about her referral for water therapy that would take her insurance. Pt found that [employer] physical therapy [facility] would. The phone number is [phone].

## 2023-02-11 NOTE — Telephone Encounter (Signed)
Referral changed and faxed. 

## 2023-02-13 ENCOUNTER — Telehealth: Payer: Self-pay | Admitting: Rheumatology

## 2023-02-13 NOTE — Telephone Encounter (Signed)
Pt would like to let Jasmine December know that her insurance changes the 1st of the month and she will be waiting to see a dr for her water therapy. Pt states there is a place in [place] that she likes that may take her insurance but she would like to wait.

## 2023-02-16 DIAGNOSIS — M19211 Secondary osteoarthritis, right shoulder: Secondary | ICD-10-CM | POA: Diagnosis not present

## 2023-02-17 ENCOUNTER — Ambulatory Visit: Payer: Self-pay | Admitting: Licensed Clinical Social Worker

## 2023-02-17 NOTE — Patient Outreach (Signed)
  Care Coordination   02/17/2023 Name: [name] MRN: [id number] DOB: Nov 22, 1960   Care Coordination Outreach Attempts:  An unsuccessful telephone outreach was attempted for a scheduled appointment today.  Follow Up Plan:  Additional outreach attempts will be made to offer the patient care coordination information and services.   Encounter Outcome:  No Answer   Care Coordination Interventions:  No, not indicated    Jeanie Cooks, PhD [facility], [facility] Social Worker Direct Dial: [phone]  Fax: [phone]

## 2023-02-18 ENCOUNTER — Telehealth: Payer: Self-pay | Admitting: Internal Medicine

## 2023-02-18 DIAGNOSIS — M9901 Segmental and somatic dysfunction of cervical region: Secondary | ICD-10-CM | POA: Diagnosis not present

## 2023-02-18 DIAGNOSIS — M546 Pain in thoracic spine: Secondary | ICD-10-CM | POA: Diagnosis not present

## 2023-02-18 DIAGNOSIS — M9902 Segmental and somatic dysfunction of thoracic region: Secondary | ICD-10-CM | POA: Diagnosis not present

## 2023-02-18 DIAGNOSIS — M542 Cervicalgia: Secondary | ICD-10-CM | POA: Diagnosis not present

## 2023-02-18 NOTE — Telephone Encounter (Signed)
Interpretation of CT not available . Patient will be notified when available

## 2023-02-18 NOTE — Telephone Encounter (Signed)
Copied from CRM [id number]. Topic: General - Inquiry >> Feb 18, 2023 10:07 AM De Blanch wrote: Reason for CRM:Pt is calling to f/u on recent imaging for lung CT. Requesting a callback with results.  Please advise.

## 2023-02-18 NOTE — Telephone Encounter (Signed)
Yes please call the radiology reading room and let them know that patient has called the office inquiring about the results.  If they can read the CAT scan as soon as possible and send [place] the report. -Let patient know that the radiologist is behind on reading CAT scans.  We have requested that they read it as soon as possible.  We will contact her once we get the report.

## 2023-02-18 NOTE — Telephone Encounter (Signed)
Called & spoke to the radiology reading room. She stated that the CAT scan will be sent to the radiologist but may take a bit more time due to IT imaging processing first.   Called & LVM informing patient that the CAT scan image is still waiting to be read. Will contact patient once results are ready.

## 2023-02-24 DIAGNOSIS — M542 Cervicalgia: Secondary | ICD-10-CM | POA: Diagnosis not present

## 2023-02-24 DIAGNOSIS — M9902 Segmental and somatic dysfunction of thoracic region: Secondary | ICD-10-CM | POA: Diagnosis not present

## 2023-02-24 DIAGNOSIS — M546 Pain in thoracic spine: Secondary | ICD-10-CM | POA: Diagnosis not present

## 2023-02-24 DIAGNOSIS — M9901 Segmental and somatic dysfunction of cervical region: Secondary | ICD-10-CM | POA: Diagnosis not present

## 2023-02-24 NOTE — Telephone Encounter (Signed)
Patient has appt in [place] office to establish care on 02/25/23.

## 2023-02-25 ENCOUNTER — Telehealth: Payer: Self-pay | Admitting: *Deleted

## 2023-02-25 ENCOUNTER — Ambulatory Visit: Payer: No Typology Code available for payment source | Admitting: Pulmonary Disease

## 2023-02-25 NOTE — Progress Notes (Unsigned)
Complex Care Management  Outreach Note  02/25/2023 Name: [name] MRN: [id number] DOB: [birth year]   Complex Care Management Outreach Attempts: An unsuccessful telephone outreach was attempted today to reschedule initial missed referral call with BSW.  Follow Up Plan:  Additional outreach attempts will be made to offer the patient complex care management information and services.   Encounter Outcome:  No Answer  Gwenevere Ghazi  Care Coordination Care Guide  Direct Dial: [phone]

## 2023-02-26 NOTE — Progress Notes (Signed)
  Care Coordination Note  02/26/2023 Name: [name] MRN: [id number] DOB: 07/07/1960  [name] is a 62 y.o. year old female who is a primary care patient of Marcine Matar, MD and is actively engaged with the care management team. I reached out to Joellyn Rued by phone today to assist with scheduling an initial visit with the BSW  Follow up plan: Telephone appointment with care management team member scheduled for:03/19/23  [facility] Coordination Care Guide  Direct Dial: [phone]

## 2023-03-03 DIAGNOSIS — M542 Cervicalgia: Secondary | ICD-10-CM | POA: Diagnosis not present

## 2023-03-03 DIAGNOSIS — M9902 Segmental and somatic dysfunction of thoracic region: Secondary | ICD-10-CM | POA: Diagnosis not present

## 2023-03-03 DIAGNOSIS — M546 Pain in thoracic spine: Secondary | ICD-10-CM | POA: Diagnosis not present

## 2023-03-03 DIAGNOSIS — M9901 Segmental and somatic dysfunction of cervical region: Secondary | ICD-10-CM | POA: Diagnosis not present

## 2023-03-09 ENCOUNTER — Other Ambulatory Visit (HOSPITAL_COMMUNITY): Payer: Self-pay

## 2023-03-09 ENCOUNTER — Other Ambulatory Visit: Payer: Self-pay

## 2023-03-09 ENCOUNTER — Other Ambulatory Visit: Payer: Self-pay | Admitting: Internal Medicine

## 2023-03-09 DIAGNOSIS — J449 Chronic obstructive pulmonary disease, unspecified: Secondary | ICD-10-CM

## 2023-03-09 MED ORDER — BREZTRI AEROSPHERE 160-9-4.8 MCG/ACT IN AERO
2.0000 | INHALATION_SPRAY | Freq: Two times a day (BID) | RESPIRATORY_TRACT | 0 refills | Status: DC
  Filled 2023-03-09: qty 10.7, 30d supply, fill #0

## 2023-03-09 NOTE — Telephone Encounter (Signed)
Pt needs an appointment for further refills  

## 2023-03-09 NOTE — Progress Notes (Signed)
 Office Visit Note  Patient: [name]             Date of Birth: 10-18-1960           MRN: [id number]             PCP: Vicci Barnie NOVAK, MD Referring: Vicci Barnie NOVAK, MD Visit Date: 03/23/2023 Occupation: @GUAROCC @  Subjective:  Patient discontinued Enbrel    History of Present Illness: [name] is a 62 y.o. female with history of seropositive rheumatoid arthritis.  Patient remains on Otrexup  20 mg sq injections once weekly and folic acid  2 mg daily.   Patient discontinued enbrel  2-3 months ago due to no longer feeling that she needed it as combination therapy.  Patient has been taking collagen on a daily basis which she feels has made a significant improvement in her symptoms.  Patient was last prescribed a prednisone  taper on 01/02/2023.  Overall her symptoms have been well-controlled on Otrexup  as monotherapy.  Patient for that she recently had to have a right shoulder injection by orthopedics at which time she also x-rays which had not progressed in 4 years.  Patient is questioning if she should resume Enbrel  in combination with Otrexup . Patient requested to have physical therapy at [facility] for aquatic therapy and would like a new order placed today since she has had a change in insurance. She is scheduled to have an updated mammogram on Thursday.   Activities of Daily Living:  Patient reports morning stiffness for 1-2 hours.   Patient Reports nocturnal pain.  Difficulty dressing/grooming: Denies Difficulty climbing stairs: Denies Difficulty getting out of chair: Denies Difficulty using hands for taps, buttons, cutlery, and/or writing: Reports  Review of Systems  Constitutional:  Positive for fatigue.  HENT:  Negative for mouth sores and mouth dryness.   Eyes:  Negative for dryness.  Respiratory:  Positive for shortness of breath.   Cardiovascular:  Negative for chest pain and palpitations.  Gastrointestinal:  Negative for blood in stool, constipation and  diarrhea.  Endocrine: Negative for increased urination.  Genitourinary:  Positive for involuntary urination.  Musculoskeletal:  Positive for joint pain, joint pain, joint swelling, myalgias, muscle weakness, morning stiffness and myalgias. Negative for gait problem and muscle tenderness.  Skin:  Negative for color change, rash, hair loss and sensitivity to sunlight.  Allergic/Immunologic: Negative for susceptible to infections.  Neurological:  Negative for dizziness and headaches.  Hematological:  Negative for swollen glands.  Psychiatric/Behavioral:  Positive for sleep disturbance. Negative for depressed mood. The patient is not nervous/anxious.     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   Adenomatous colon polyp 03/16/2019   Degenerative joint disease of shoulder region 06/08/2018   Rheumatoid arthritis involving both hands with positive rheumatoid factor (HCC) 06/08/2018   Rheumatoid arthritis involving both feet with positive rheumatoid factor (HCC) 06/08/2018   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   Status post total hip replacement, 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   Smoker 06/08/2018   Anxiety and depression 06/08/2018   Family history of breast cancer 03/25/2018   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   Rheumatoid arthritis  involving multiple sites (HCC) 01/15/2018   Tobacco dependence 01/15/2018   Opioid use agreement exists 01/15/2018   Positive depression screening 01/15/2018   Homeless 01/15/2018    Past Medical History:  Diagnosis Date   Anemia    during pregnancy   Anxiety    Asthma    Bronchitis age 29    Cancer (HCC)    left breast ca   COPD (chronic obstructive pulmonary disease) (HCC)    Depression    Hx of bladder infections    been over 5 years since last one    Malignant neoplasm of upper-outer quadrant of left breast in female, estrogen receptor positive (HCC)    Rheumatoid arthritis (HCC)    unc rheum    Tobacco abuse    Trouble in sleeping     Family History  Problem Relation Age of Onset   Breast cancer Mother 81       metastatic   Cervical cancer Mother    Lung cancer Brother        maternal half brother; d. 61s   Lung cancer Maternal Uncle        d. 28s   Lung cancer Maternal Uncle        d. 30   Cancer Maternal Grandmother        unknown type; dx 43s   Healthy Son    Breast cancer Maternal Great-grandmother        dx unknown age   Past Surgical History:  Procedure Laterality Date   BREAST LUMPECTOMY WITH RADIOACTIVE SEED AND SENTINEL LYMPH NODE BIOPSY Left 08/30/2019   Procedure: LEFT BREAST LUMPECTOMY X 2 WITH RADIOACTIVE SEED AND SENTINEL LYMPH NODE BIOPSY;  Surgeon: Curvin Deward MOULD, MD;  Location: [facility] OR;  Service: General;  Laterality: Left;  PEC BLOCK   COLONOSCOPY WITH PROPOFOL  N/A 03/15/2019   Procedure: COLONOSCOPY WITH PROPOFOL ;  Surgeon: Therisa Bi, MD;  Location: [facility] ENDOSCOPY;  Service: Gastroenterology;  Laterality: N/A;   COLONOSCOPY WITH PROPOFOL  N/A 07/29/2022   Procedure: COLONOSCOPY WITH PROPOFOL ;  Surgeon: Therisa Bi, MD;  Location: [facility] ENDOSCOPY;  Service: Gastroenterology;  Laterality: N/A;   HIP ARTHROPLASTY     JOINT REPLACEMENT     MULTIPLE TOOTH EXTRACTIONS     RE-EXCISION OF BREAST LUMPECTOMY Left 10/07/2019   Procedure: RE-EXCISION OF LEFT BREAST INFERIOR MARGIN;  Surgeon: Curvin Deward MOULD, MD;  Location: [facility];  Service: General;  Laterality: Left;   TOTAL HIP ARTHROPLASTY Left 02/15/2018   Procedure: TOTAL HIP ARTHROPLASTY ANTERIOR APPROACH;  Surgeon: Liam Lerner, MD;  Location: [facility] ORS;  Service: Orthopedics;   Laterality: Left;   WISDOM TOOTH EXTRACTION     Social History   Social History Narrative   Not on file   Immunization History  Administered Date(s) Administered   Influenza, Seasonal, Injecte, Preservative Fre 01/29/2023   Influenza,inj,Quad PF,6+ Mos 12/18/2020, 03/17/2022   PFIZER(Purple Top)SARS-COV-2 Vaccination 06/27/2019, 07/19/2019   Pneumococcal Conjugate-13 07/14/2018   Tdap 03/25/2018   Zoster Recombinant(Shingrix ) 07/14/2018, 06/17/2022     Objective: Vital Signs: BP 100/67 (BP Location: Left Arm, Patient Position: Sitting, Cuff Size: Normal)   Pulse 83   Resp 16   Ht 5' 5.5 (1.664 m)   Wt 115 lb (52.2 kg)   BMI 18.85 kg/m    Physical Exam Vitals and nursing note reviewed.  Constitutional:      Appearance: She is well-developed.  HENT:     Head: Normocephalic and atraumatic.  Eyes:     Conjunctiva/sclera:  Conjunctivae normal.  Cardiovascular:     Rate and Rhythm: Normal rate and regular rhythm.     Heart sounds: Normal heart sounds.  Pulmonary:     Effort: Pulmonary effort is normal.     Breath sounds: Normal breath sounds.  Abdominal:     General: Bowel sounds are normal.     Palpations: Abdomen is soft.  Musculoskeletal:     Cervical back: Normal range of motion.  Lymphadenopathy:     Cervical: No cervical adenopathy.  Skin:    General: Skin is warm and dry.     Capillary Refill: Capillary refill takes less than 2 seconds.  Neurological:     Mental Status: She is alert and oriented to person, place, and time.  Psychiatric:        Behavior: Behavior normal.      Musculoskeletal Exam: C-spine has limited ROM with lateral rotation.   Painful ROM of both shoulders.  Limited ROM with right shoulder. Left elbow contracture.  Wrist joints have good ROM.  PIP and DIP thickening consistent with osteoarthritis of both hands.  Synovial thickening of all MCP joints. Left hip replacement has good ROM with no groin pain.  Knee joints have good ROM with no  warmth or effusion.  Ankle joints have good ROM with no tenderness or joint swelling.   CDAI Exam: CDAI Score: -- Patient Global: --; Provider Global: -- Swollen: --; Tender: -- Joint Exam 03/23/2023   No joint exam has been documented for this visit   There is currently no information documented on the homunculus. Go to the Rheumatology activity and complete the homunculus joint exam.  Investigation: No additional findings.  Imaging: No results found.   Recent Labs: Lab Results  Component Value Date   WBC 4.6 10/15/2022   HGB 15.2 10/15/2022   PLT 197 10/15/2022   NA 140 10/15/2022   K 4.5 10/15/2022   CL 103 10/15/2022   CO2 30 10/15/2022   GLUCOSE 72 10/15/2022   BUN 14 10/15/2022   CREATININE 0.76 10/15/2022   BILITOT 0.7 10/15/2022   ALKPHOS 66 12/14/2020   AST 17 10/15/2022   ALT 15 10/15/2022   PROT 6.4 10/15/2022   ALBUMIN 4.4 12/14/2020   CALCIUM 9.4 10/15/2022   GFRAA 101 07/30/2020   QFTBGOLDPLUS NEGATIVE 10/15/2022    Speciality Comments: No specialty comments available.  Procedures:  No procedures performed Allergies: Trazodone  and nefazodone and Other     Assessment / Plan:     Visit Diagnoses: Rheumatoid arthritis involving multiple sites with positive rheumatoid factor (HCC): She has no synovitis on examination today.  She has not had any signs or symptoms of a recent rheumatoid arthritis flare.  Patient is currently on Otrexup  20 mg subcu days injections once weekly and folic acid  2 mg daily.  She has been tolerating Otrexup  without any side effects or injection site reactions.  Patient discontinued Enbrel  2 to 3 months ago on her own since she was clinically doing well.  She recently had a right shoulder injection performed by orthopedics at which time she also had x-rays performed which did not reveal any radiographic progression.  Discussed my concern for future radiographic progression as well as an increased frequency of flares if she remains  on Otrexup  as monotherapy given the chronicity and severity of her rheumatoid arthritis.  A prednisone  taper was sent to the pharmacy on 01/02/2023.  Discouraged the use of frequent prednisone  going forward.  She was in agreement to resume Enbrel   as combination therapy.  She will notify us  when she needs a refill of Enbrel  sent to the pharmacy. She requested a referral to physical therapy specifically aquatic therapy at [facility] so an order will be placed today.   She will notify us  if she develops any new or worsening symptoms.  She will follow-up in the office in 5 months or sooner if needed.  High risk medication use - Enbrel  50 mg sq injections once weekly, Otrexup  20 mg sq injections once weekly, folic acid  2 mg daily.  CBC and CMP updated on 10/15/22. Orders for CBC and CMP released today. Her next lab work will be due in April and every 3 months.  TB gold negative on 10/15/22. No recent or recurrent infections. Discussed the importance of holding enbrel  and otrexup  if she develops signs or symptoms of an infection and to resume once the infection has completely cleared. - Plan: COMPLETE METABOLIC PANEL WITH GFR, CBC with Differential/Platelet  Contracture of left elbow: Unchanged.  No tenderness or inflammation noted.  Other secondary osteoarthritis of both shoulders: Limited mobility.  Chronic pain involving the right shoulder.  She had a recent right shoulder joint cortisone injection performed by orthopedics which has alleviated some of her discomfort.  Patient has requested a referral to aquatic therapy at [facility].  Status post total hip replacement, left: Her pain level has improved since starting to see a chiropractor.  Patient requested a referral to aquatic therapy, so the order for PT will be placed today.   Other medical conditions are listed as follows:   [name] - - 08/30/19 lumpectomy/radiation.No chemo.-no evidence of malignancy. Stable  probably benign reactive LN left axilla-ultrasound updated on 09/01/2022.on tamoxifen .  Other emphysema (HCC) - HRCT obtained on June 28, 2021 was consistent with mild bilateral bronchial wall thickening consistent with COPD and and a pulmonary nodule was noted.  Solitary pulmonary nodule on lung CT - Followed by Dr. Darlean  Smoker  Anxiety and depression  Family history of breast cancer  History of anemia  Orders: Orders Placed This Encounter  Procedures   COMPLETE METABOLIC PANEL WITH GFR   CBC with Differential/Platelet   No orders of the defined types were placed in this encounter.    Follow-Up Instructions: Return in about 5 months (around 08/21/2023) for Rheumatoid arthritis.   Waddell CHRISTELLA Craze, PA-C  Note - This record has been created using Dragon software.  Chart creation errors have been sought, but may not always  have been located. Such creation errors do not reflect on  the standard of medical care.

## 2023-03-10 ENCOUNTER — Inpatient Hospital Stay: Admission: RE | Admit: 2023-03-10 | Payer: No Typology Code available for payment source | Source: Ambulatory Visit

## 2023-03-10 ENCOUNTER — Other Ambulatory Visit: Payer: Self-pay

## 2023-03-10 ENCOUNTER — Other Ambulatory Visit: Payer: No Typology Code available for payment source

## 2023-03-12 ENCOUNTER — Other Ambulatory Visit: Payer: Self-pay | Admitting: Internal Medicine

## 2023-03-17 DIAGNOSIS — M546 Pain in thoracic spine: Secondary | ICD-10-CM | POA: Diagnosis not present

## 2023-03-17 DIAGNOSIS — M542 Cervicalgia: Secondary | ICD-10-CM | POA: Diagnosis not present

## 2023-03-17 DIAGNOSIS — M9902 Segmental and somatic dysfunction of thoracic region: Secondary | ICD-10-CM | POA: Diagnosis not present

## 2023-03-17 DIAGNOSIS — M9901 Segmental and somatic dysfunction of cervical region: Secondary | ICD-10-CM | POA: Diagnosis not present

## 2023-03-19 ENCOUNTER — Ambulatory Visit: Payer: No Typology Code available for payment source | Admitting: Physician Assistant

## 2023-03-19 ENCOUNTER — Ambulatory Visit: Payer: Self-pay | Admitting: Licensed Clinical Social Worker

## 2023-03-19 NOTE — Patient Instructions (Signed)
 Visit Information  Thank you for taking time to visit with me today. Please don't hesitate to contact me if I can be of assistance to you.   Following are the goals we discussed today:   Goals Addressed             This Visit's Progress    Care Coordination Activities       Care Coordination Interventions: Patient stated that she is currently living in an apartment with a roommate and housing is fine but if something were to happen to her roommate then she may be in need. Patient wants the SW to mail out a list of housing resources.  SW went over the SDOH questions and did not have nay need at this time, she stated that food is always a problem because food is so expensive,but she is aware of the food banks and she is in [place] and uses [employer] [facility] in [place]. SW will send out additional food pantry list.  SW will follow up in 2 weeks         Our next appointment is by telephone on 04/02/2023 at 1:30 pm  Please call the care guide team at [phone] if you need to cancel or reschedule your appointment.   If you are experiencing a Mental Health or Behavioral Health Crisis or need someone to talk to, please call the Suicide and Crisis Lifeline: 988 call the [place]  National Suicide Prevention Lifeline: (505) 195-9822 or TTY: 562-528-3085 TTY 321-873-4651) to talk to a trained counselor call 1-800-273-TALK (toll free, 24 hour hotline) call 911  Patient verbalizes understanding of instructions and care plan provided today and agrees to view in MyChart. Active MyChart status and patient understanding of how to access instructions and care plan via MyChart confirmed with patient.     Tobias CHARM Maranda HEDWIG, PhD [facility], [facility] Social Worker Direct Dial: [phone]  Fax: [phone]

## 2023-03-19 NOTE — Patient Outreach (Signed)
  Care Coordination   Initial Visit Note   03/19/2023 Name: [name] MRN: [id number] DOB: [birth year]  [name] is a 63 y.o. year old female who sees Vicci Barnie NOVAK, MD for primary care. I spoke with  [name] by phone today.  What matters to the patients health and wellness today?  Housing and Food Insecurities     Goals Addressed             This Visit's Progress    Care Coordination Activities       Care Coordination Interventions: Patient stated that she is currently living in an apartment with a roommate and housing is fine but if something were to happen to her roommate then she may be in need. Patient wants the SW to mail out a list of housing resources.  SW went over the SDOH questions and did not have nay need at this time, she stated that food is always a problem because food is so expensive,but she is aware of the food banks and she is in [place] and uses [employer] [facility] in [place]. SW will send out additional food pantry list.  SW will follow up in 2 weeks         SDOH assessments and interventions completed:  Yes  SDOH Interventions Today    Flowsheet Row Most Recent Value  SDOH Interventions   Food Insecurity Interventions Intervention Not Indicated  Transportation Interventions Intervention Not Indicated  Utilities Interventions Intervention Not Indicated        Care Coordination Interventions:  Yes, provided  Interventions Today    Flowsheet Row Most Recent Value  General Interventions   General Interventions Discussed/Reviewed General Interventions Discussed, [employer] is ok with housing currently but would still like for the SW to mail out a list of housing resources.]        Follow up plan: Follow up call scheduled for 04/02/2023 at 1:30 pm    Encounter Outcome:  Patient Visit Completed   Tobias CHARM Maranda HEDWIG, PhD [facility], [facility] Social Worker Direct Dial:  [phone]  Fax: [phone]

## 2023-03-23 ENCOUNTER — Encounter: Payer: Self-pay | Admitting: Physician Assistant

## 2023-03-23 ENCOUNTER — Ambulatory Visit: Payer: 59 | Attending: Physician Assistant | Admitting: Physician Assistant

## 2023-03-23 VITALS — BP 100/67 | HR 83 | Resp 16 | Ht 65.5 in | Wt 115.0 lb

## 2023-03-23 DIAGNOSIS — Z79899 Other long term (current) drug therapy: Secondary | ICD-10-CM | POA: Diagnosis not present

## 2023-03-23 DIAGNOSIS — F17200 Nicotine dependence, unspecified, uncomplicated: Secondary | ICD-10-CM | POA: Diagnosis not present

## 2023-03-23 DIAGNOSIS — Z862 Personal history of diseases of the blood and blood-forming organs and certain disorders involving the immune mechanism: Secondary | ICD-10-CM

## 2023-03-23 DIAGNOSIS — M0579 Rheumatoid arthritis with rheumatoid factor of multiple sites without organ or systems involvement: Secondary | ICD-10-CM | POA: Diagnosis not present

## 2023-03-23 DIAGNOSIS — R911 Solitary pulmonary nodule: Secondary | ICD-10-CM | POA: Diagnosis not present

## 2023-03-23 DIAGNOSIS — Z96642 Presence of left artificial hip joint: Secondary | ICD-10-CM | POA: Diagnosis not present

## 2023-03-23 DIAGNOSIS — F32A Depression, unspecified: Secondary | ICD-10-CM

## 2023-03-23 DIAGNOSIS — D0512 Intraductal carcinoma in situ of left breast: Secondary | ICD-10-CM | POA: Diagnosis not present

## 2023-03-23 DIAGNOSIS — F419 Anxiety disorder, unspecified: Secondary | ICD-10-CM

## 2023-03-23 DIAGNOSIS — J438 Other emphysema: Secondary | ICD-10-CM | POA: Diagnosis not present

## 2023-03-23 DIAGNOSIS — M19211 Secondary osteoarthritis, right shoulder: Secondary | ICD-10-CM | POA: Diagnosis not present

## 2023-03-23 DIAGNOSIS — Z803 Family history of malignant neoplasm of breast: Secondary | ICD-10-CM | POA: Diagnosis not present

## 2023-03-23 DIAGNOSIS — M19212 Secondary osteoarthritis, left shoulder: Secondary | ICD-10-CM

## 2023-03-23 DIAGNOSIS — M24522 Contracture, left elbow: Secondary | ICD-10-CM | POA: Diagnosis not present

## 2023-03-23 NOTE — Patient Instructions (Signed)
 Standing Labs We placed an order today for your standing lab work.   Please have your standing labs drawn in April and every 3 months   Please have your labs drawn 2 weeks prior to your appointment so that the provider can discuss your lab results at your appointment, if possible.  Please note that you may see your imaging and lab results in MyChart before we have reviewed them. We will contact you once all results are reviewed. Please allow our office up to 72 hours to thoroughly review all of the results before contacting the office for clarification of your results.  WALK-IN LAB HOURS  Monday through Thursday from 8:00 am -12:30 pm and 1:00 pm-5:00 pm and Friday from 8:00 am-12:00 pm.  Patients with office visits requiring labs will be seen before walk-in labs.  You may encounter longer than normal wait times. Please allow additional time. Wait times may be shorter on  Monday and Thursday afternoons.  We do not book appointments for walk-in labs. We appreciate your patience and understanding with our staff.   Labs are drawn by [facility]. Please bring your co-pay at the time of your lab draw.  You may receive a bill from [facility] for your lab work.  Please note if you are on Hydroxychloroquine and and an order has been placed for a Hydroxychloroquine level,  you will need to have it drawn 4 hours or more after your last dose.  If you wish to have your labs drawn at another location, please call the office 24 hours in advance so we can fax the orders.  The office is located at [address], [place], [place] [address]   If you have any questions regarding directions or hours of operation,  please call [phone].   As a reminder, please drink plenty of water prior to coming for your lab work. Thanks!

## 2023-03-24 ENCOUNTER — Other Ambulatory Visit: Payer: Self-pay | Admitting: *Deleted

## 2023-03-24 ENCOUNTER — Other Ambulatory Visit (HOSPITAL_COMMUNITY): Payer: Self-pay

## 2023-03-24 ENCOUNTER — Other Ambulatory Visit: Payer: Self-pay

## 2023-03-24 LAB — CBC WITH DIFFERENTIAL/PLATELET
Absolute Lymphocytes: 1695 {cells}/uL (ref 850–3900)
Absolute Monocytes: 338 {cells}/uL (ref 200–950)
Basophils Absolute: 31 {cells}/uL (ref 0–200)
Basophils Relative: 0.6 %
Eosinophils Absolute: 88 {cells}/uL (ref 15–500)
Eosinophils Relative: 1.7 %
HCT: 44.2 % (ref 35.0–45.0)
Hemoglobin: 14.3 g/dL (ref 11.7–15.5)
MCH: 30.2 pg (ref 27.0–33.0)
MCHC: 32.4 g/dL (ref 32.0–36.0)
MCV: 93.2 fL (ref 80.0–100.0)
MPV: 10.3 fL (ref 7.5–12.5)
Monocytes Relative: 6.5 %
Neutro Abs: 3047 {cells}/uL (ref 1500–7800)
Neutrophils Relative %: 58.6 %
Platelets: 229 10*3/uL (ref 140–400)
RBC: 4.74 10*6/uL (ref 3.80–5.10)
RDW: 13.3 % (ref 11.0–15.0)
Total Lymphocyte: 32.6 %
WBC: 5.2 10*3/uL (ref 3.8–10.8)

## 2023-03-24 LAB — COMPLETE METABOLIC PANEL WITH GFR
AG Ratio: 2 (calc) (ref 1.0–2.5)
ALT: 17 U/L (ref 6–29)
AST: 17 U/L (ref 10–35)
Albumin: 4.4 g/dL (ref 3.6–5.1)
Alkaline phosphatase (APISO): 68 U/L (ref 37–153)
BUN: 12 mg/dL (ref 7–25)
CO2: 30 mmol/L (ref 20–32)
Calcium: 9.4 mg/dL (ref 8.6–10.4)
Chloride: 104 mmol/L (ref 98–110)
Creat: 0.75 mg/dL (ref 0.50–1.05)
Globulin: 2.2 g/dL (ref 1.9–3.7)
Glucose, Bld: 82 mg/dL (ref 65–99)
Potassium: 4.5 mmol/L (ref 3.5–5.3)
Sodium: 141 mmol/L (ref 135–146)
Total Bilirubin: 0.7 mg/dL (ref 0.2–1.2)
Total Protein: 6.6 g/dL (ref 6.1–8.1)
eGFR: 89 mL/min/{1.73_m2} (ref >=60)

## 2023-03-24 MED ORDER — ENBREL MINI 50 MG/ML ~~LOC~~ SOCT
50.0000 mg | SUBCUTANEOUS | 2 refills | Status: DC
  Filled 2023-03-24 – 2023-05-18 (×2): qty 4, 28d supply, fill #0
  Filled 2023-06-12: qty 4, 28d supply, fill #1
  Filled 2023-07-22: qty 4, 28d supply, fill #2

## 2023-03-24 NOTE — Progress Notes (Signed)
 CBC and CMP WNL

## 2023-03-25 ENCOUNTER — Other Ambulatory Visit: Payer: Self-pay | Admitting: Internal Medicine

## 2023-03-25 DIAGNOSIS — M542 Cervicalgia: Secondary | ICD-10-CM | POA: Diagnosis not present

## 2023-03-25 DIAGNOSIS — M546 Pain in thoracic spine: Secondary | ICD-10-CM | POA: Diagnosis not present

## 2023-03-25 DIAGNOSIS — M9901 Segmental and somatic dysfunction of cervical region: Secondary | ICD-10-CM | POA: Diagnosis not present

## 2023-03-25 DIAGNOSIS — F17210 Nicotine dependence, cigarettes, uncomplicated: Secondary | ICD-10-CM

## 2023-03-25 DIAGNOSIS — M9902 Segmental and somatic dysfunction of thoracic region: Secondary | ICD-10-CM | POA: Diagnosis not present

## 2023-03-25 NOTE — Telephone Encounter (Signed)
 Medication Refill -  Most Recent Primary Care Visit:  Provider: Hassie Lint  Department: [facility]  Visit Type: OFFICE VISIT  Date: 01/29/2023  Medication: nicotine  (NICODERM CQ  - DOSED IN MG/24 HR) 7 mg/24hr patch   Has the patient contacted their pharmacy? Yes   Is this the correct pharmacy for this prescription? Yes If no, delete pharmacy and type the correct one.  This is the patient's preferred pharmacy:    [facility] Phone: [phone]  Fax: [phone]      Has the prescription been filled recently? No  Is the patient out of the medication? Yes   Has the patient been seen for an appointment in the last year OR does the patient have an upcoming appointment? Yes  Can we respond through MyChart? Yes  Please assist patient further

## 2023-03-26 ENCOUNTER — Ambulatory Visit
Admission: RE | Admit: 2023-03-26 | Discharge: 2023-03-26 | Disposition: A | Discharge status: Home / Self Care | Payer: 59 | Source: Ambulatory Visit | Attending: Internal Medicine | Admitting: Internal Medicine

## 2023-03-26 ENCOUNTER — Other Ambulatory Visit: Payer: Self-pay

## 2023-03-26 DIAGNOSIS — C50412 Malignant neoplasm of upper-outer quadrant of left female breast: Secondary | ICD-10-CM

## 2023-03-26 DIAGNOSIS — R590 Localized enlarged lymph nodes: Secondary | ICD-10-CM | POA: Diagnosis not present

## 2023-03-26 DIAGNOSIS — R928 Other abnormal and inconclusive findings on diagnostic imaging of breast: Secondary | ICD-10-CM | POA: Diagnosis not present

## 2023-03-26 DIAGNOSIS — Z170 Estrogen receptor positive status [ER+]: Secondary | ICD-10-CM | POA: Diagnosis not present

## 2023-03-26 DIAGNOSIS — R92333 Mammographic heterogeneous density, bilateral breasts: Secondary | ICD-10-CM | POA: Diagnosis not present

## 2023-03-26 DIAGNOSIS — M069 Rheumatoid arthritis, unspecified: Secondary | ICD-10-CM | POA: Diagnosis not present

## 2023-03-26 MED ORDER — NICOTINE 7 MG/24HR TD PT24
7.0000 mg | MEDICATED_PATCH | Freq: Every day | TRANSDERMAL | 0 refills | Status: DC
  Filled 2023-03-26: qty 28, 28d supply, fill #0

## 2023-03-30 ENCOUNTER — Other Ambulatory Visit: Payer: Self-pay

## 2023-03-30 ENCOUNTER — Other Ambulatory Visit (HOSPITAL_COMMUNITY): Payer: Self-pay

## 2023-04-01 ENCOUNTER — Ambulatory Visit: Payer: 59 | Admitting: Pulmonary Disease

## 2023-04-01 ENCOUNTER — Encounter: Payer: Self-pay | Admitting: Pulmonary Disease

## 2023-04-01 VITALS — BP 110/60 | HR 101 | Temp 97.5°F | Ht 65.5 in | Wt 116.2 lb

## 2023-04-01 DIAGNOSIS — M9901 Segmental and somatic dysfunction of cervical region: Secondary | ICD-10-CM | POA: Diagnosis not present

## 2023-04-01 DIAGNOSIS — M546 Pain in thoracic spine: Secondary | ICD-10-CM | POA: Diagnosis not present

## 2023-04-01 DIAGNOSIS — M542 Cervicalgia: Secondary | ICD-10-CM | POA: Diagnosis not present

## 2023-04-01 DIAGNOSIS — M9902 Segmental and somatic dysfunction of thoracic region: Secondary | ICD-10-CM | POA: Diagnosis not present

## 2023-04-01 DIAGNOSIS — J439 Emphysema, unspecified: Secondary | ICD-10-CM

## 2023-04-01 DIAGNOSIS — J4489 Other specified chronic obstructive pulmonary disease: Secondary | ICD-10-CM

## 2023-04-02 ENCOUNTER — Ambulatory Visit: Payer: No Typology Code available for payment source | Admitting: Internal Medicine

## 2023-04-02 ENCOUNTER — Ambulatory Visit: Payer: Self-pay | Admitting: Licensed Clinical Social Worker

## 2023-04-02 NOTE — Patient Outreach (Signed)
  Care Coordination   04/02/2023 Name: [name] MRN: [id number] DOB: June 19, 1960   Care Coordination Outreach Attempts:  An unsuccessful outreach was attempted for an appointment today.  Follow Up Plan:  Additional outreach attempts will be made to offer the patient complex care management information and services.   Encounter Outcome:  No Answer   Care Coordination Interventions:  No, not indicated   This was a follow up call to the patient, Sw will add patient back to schedule  Jeanie Cooks, PhD [facility], [facility] Social Worker Direct Dial: [phone]  Fax: [phone]

## 2023-04-05 NOTE — Progress Notes (Signed)
Synopsis: Referred in by Marcine Matar, MD   Subjective:   PATIENT ID: [name] GENDER: female DOB: 19-Jan-1961, MRN: [id number]  Chief Complaint  Patient presents with   Follow-up    DOE. Occasional wheezing and cough.    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 Sherene Sires presenting today to the pumonary clinic to establish care.   She was last seen by Dr. Sherene Sires om 02/2022 at which point she was switched to [facility]. Doing well on Breztri but remains with significant dyspnea.   PFTs 2023 with Stage III COPD with post bronchodilator FEV-1 32% of predicted. A1AT 186 MM.     Family history: Maternal Uncle with lung cancer.   Social history:  Ex smoker quit in 2023. Smoked 1 ppd for multiple years.   ROS All systems were reviewed and are negative except for the above.  Objective:   Vitals:   04/01/23 1616  BP: 110/60  Pulse: (!) 101  Temp: (!) 97.5 F (36.4 C)  SpO2: 93%  Weight: 116 lb 3.2 oz (52.7 kg)  Height: 5' 5.5" (1.664 m)   93% on RA BMI Readings from Last 3 Encounters:  04/01/23 19.04 kg/m  03/23/23 18.85 kg/m  01/29/23 18.85 kg/m   Wt Readings from Last 3 Encounters:  04/01/23 116 lb 3.2 oz (52.7 kg)  03/23/23 115 lb (52.2 kg)  01/29/23 115 lb (52.2 kg)    Physical Exam GEN: NAD, Healthy Appearing HEENT: Supple Neck, Reactive Pupils, EOMI  CVS: Normal S1, Normal S2, RRR, No murmurs or ES appreciated  Lungs: Poor air movement bilaterally.  Abdomen: Soft, non tender, non distended, + BS  Extremities: Warm and well perfused, No edema  Skin: No suspicious lesions appreciated  Psych: Normal Affect  Ancillary Information   CBC    Component Value Date/Time   WBC 5.2 03/23/2023 1401   RBC 4.74 03/23/2023 1401   HGB 14.3 03/23/2023 1401   HGB 14.5 10/24/2020 1210   HGB 12.8 01/15/2018 1509   HCT 44.2 03/23/2023 1401   HCT 38.3 01/15/2018 1509   PLT 229  03/23/2023 1401   PLT 167 10/24/2020 1210   PLT 311 01/15/2018 1509   MCV 93.2 03/23/2023 1401   MCV 86 01/15/2018 1509   MCH 30.2 03/23/2023 1401   MCHC 32.4 03/23/2023 1401   RDW 13.3 03/23/2023 1401   RDW 14.2 01/15/2018 1509   LYMPHSABS 1,380 10/15/2022 1507   MONOABS 0.5 07/31/2021 1145   EOSABS 88 03/23/2023 1401   BASOSABS 31 03/23/2023 1401   Labs and imaging were reviewed.     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 Sherene Sires presenting today to the pumonary clinic to establish care.   #Stage III COPD Gp B Mmrc 2. CAT 18 []  Continue with budesonide-Formoterol-Glyco [Breztri] 2puffs BID.  []  C/w Albuterol as needed.  []  I think she would be a good candidate for Ensifentrine Sharyn Blitz). Will try to get her qualified.  []  Referral to pulmonary rehab placed.  []  Will initiate discussions for lung transplant on next visit.   Return in about 3 months (around 06/30/2023).  I spent 60 minutes caring for this patient today, including preparing to see the patient, obtaining a medical history , reviewing a separately obtained history, performing a medically appropriate examination and/or evaluation, ordering medications, tests, or procedures, documenting clinical information in the electronic health record, and independently interpreting results (not separately reported/billed) and communicating results to the  patient/family/caregiver  Janann Colonel, MD [facility] 04/05/2023 4:48 PM

## 2023-04-08 ENCOUNTER — Other Ambulatory Visit: Payer: Self-pay | Admitting: Internal Medicine

## 2023-04-08 ENCOUNTER — Other Ambulatory Visit (HOSPITAL_COMMUNITY): Payer: Self-pay

## 2023-04-08 ENCOUNTER — Other Ambulatory Visit: Payer: Self-pay | Admitting: Physician Assistant

## 2023-04-08 DIAGNOSIS — M546 Pain in thoracic spine: Secondary | ICD-10-CM | POA: Diagnosis not present

## 2023-04-08 DIAGNOSIS — M9902 Segmental and somatic dysfunction of thoracic region: Secondary | ICD-10-CM | POA: Diagnosis not present

## 2023-04-08 DIAGNOSIS — M0579 Rheumatoid arthritis with rheumatoid factor of multiple sites without organ or systems involvement: Secondary | ICD-10-CM

## 2023-04-08 DIAGNOSIS — M19211 Secondary osteoarthritis, right shoulder: Secondary | ICD-10-CM | POA: Diagnosis not present

## 2023-04-08 DIAGNOSIS — J449 Chronic obstructive pulmonary disease, unspecified: Secondary | ICD-10-CM

## 2023-04-08 DIAGNOSIS — M542 Cervicalgia: Secondary | ICD-10-CM | POA: Diagnosis not present

## 2023-04-08 DIAGNOSIS — M9901 Segmental and somatic dysfunction of cervical region: Secondary | ICD-10-CM | POA: Diagnosis not present

## 2023-04-08 MED ORDER — OTREXUP 20 MG/0.4ML ~~LOC~~ SOAJ
20.0000 mg | SUBCUTANEOUS | 2 refills | Status: DC
  Filled 2023-04-08: qty 1.6, 28d supply, fill #0
  Filled 2023-05-18: qty 1.6, 28d supply, fill #1
  Filled 2023-07-22: qty 1.6, 28d supply, fill #2

## 2023-04-08 MED ORDER — BREZTRI AEROSPHERE 160-9-4.8 MCG/ACT IN AERO
2.0000 | INHALATION_SPRAY | Freq: Two times a day (BID) | RESPIRATORY_TRACT | 0 refills | Status: DC
  Filled 2023-04-08: qty 10.7, 30d supply, fill #0

## 2023-04-08 NOTE — Telephone Encounter (Signed)
Last Fill: 12/25/2022  Labs: 03/23/2023 CBC and CMP WNL   Next Visit: 08/24/2023  Last Visit: 03/23/2023  DX: Rheumatoid arthritis involving multiple sites with positive rheumatoid factor   Current Dose per office note 03/23/2023: Otrexup 20 mg sq injections once weekly   Okay to refill Otrexup?

## 2023-04-09 ENCOUNTER — Other Ambulatory Visit: Payer: Self-pay

## 2023-04-09 ENCOUNTER — Other Ambulatory Visit (HOSPITAL_COMMUNITY): Payer: Self-pay

## 2023-04-13 ENCOUNTER — Other Ambulatory Visit: Payer: Self-pay | Admitting: *Deleted

## 2023-04-13 ENCOUNTER — Telehealth: Payer: Self-pay | Admitting: Rheumatology

## 2023-04-13 DIAGNOSIS — M19211 Secondary osteoarthritis, right shoulder: Secondary | ICD-10-CM

## 2023-04-13 DIAGNOSIS — G8929 Other chronic pain: Secondary | ICD-10-CM

## 2023-04-13 NOTE — Telephone Encounter (Signed)
Patient contacted the office requesting a referral to [facility]  Patient request referral for pain.  Patient's preferred area for referral is: [employer] [phone]

## 2023-04-13 NOTE — Telephone Encounter (Signed)
Referral placed, pending appt

## 2023-04-15 ENCOUNTER — Telehealth: Payer: Self-pay

## 2023-04-15 DIAGNOSIS — M9901 Segmental and somatic dysfunction of cervical region: Secondary | ICD-10-CM | POA: Diagnosis not present

## 2023-04-15 DIAGNOSIS — M546 Pain in thoracic spine: Secondary | ICD-10-CM | POA: Diagnosis not present

## 2023-04-15 DIAGNOSIS — M542 Cervicalgia: Secondary | ICD-10-CM | POA: Diagnosis not present

## 2023-04-15 DIAGNOSIS — M9902 Segmental and somatic dysfunction of thoracic region: Secondary | ICD-10-CM | POA: Diagnosis not present

## 2023-04-15 NOTE — Telephone Encounter (Signed)
 Copied from CRM [id number]. Topic: General - Other >> Apr 13, 2023  2:56 PM Nathanel DEL wrote: Reason for CRM: pt would like a handicap placard application fillled out and mailed to her home. She states she had one and it got lost  New placard form being sent to patient provider. Patient aware Form takes 7-10 business days to complete.

## 2023-04-16 ENCOUNTER — Ambulatory Visit: Payer: Self-pay | Admitting: Licensed Clinical Social Worker

## 2023-04-16 NOTE — Patient Outreach (Signed)
  Care Coordination   04/16/2023 Name: LIBRA GATZ MRN: [id number] DOB: [birth year]   Care Coordination Outreach Attempts:  An unsuccessful outreach was attempted for an appointment today.  Follow Up Plan:  Additional outreach attempts will be made to offer the patient complex care management information and services.   Encounter Outcome:  No Answer   Care Coordination Interventions:  No, not indicated    Thi was the 2nd unsuccessful call to follow up, SW will make another attempt in 2 weeks on 05/05/2023 at 10:30 am  Tobias CHARM Maranda HEDWIG, PhD [facility], [facility] Social Worker Direct Dial: [phone]  Fax: [phone]

## 2023-04-16 NOTE — Patient Outreach (Signed)
  Care Coordination   Follow Up Visit Note   04/16/2023 Name: [name] MRN: [id number] DOB: Apr 04, 1960  [name] is a 63 y.o. year old female who sees Haley Barnie NOVAK, MD for primary care. I spoke with  [name] by phone today.  What matters to the patients health and wellness today?  Food insecurities and housing   Goals Addressed             This Visit's Progress    COMPLETED: Care Coordination Activities       Care Coordination Interventions: Patient stated that she is currently living in an apartment with a roommate and housing is fine but if something were to happen to her roommate then she may be in need. Patient wants the SW to mail out a list of housing resources.  SW went over the SDOH questions and did not have nay need at this time, she stated that food is always a problem because food is so expensive,but she is aware of the food banks and she is in [place] and uses [employer] [facility] in [place]. SW will send out additional food pantry list.  SW will follow up in 2 weeks         SDOH assessments and interventions completed:  Yes  SDOH Interventions Today    Flowsheet Row Most Recent Value  SDOH Interventions   Food Insecurity Interventions Intervention Not Indicated  Housing Interventions Intervention Not Indicated  Transportation Interventions Intervention Not Indicated  Utilities Interventions Intervention Not Indicated        Care Coordination Interventions:  Yes, provided   Follow up plan: No further intervention required.   Encounter Outcome:  Patient Visit Completed   [name] Haley HEDWIG, PhD [facility], [facility] Social Worker Direct Dial: [phone]  Fax: [phone]

## 2023-04-16 NOTE — Patient Instructions (Signed)
 Visit Information  Thank you for taking time to visit with me today. Please don't hesitate to contact me if I can be of assistance to you.   Following are the goals we discussed today:   Goals Addressed             This Visit's Progress    COMPLETED: Care Coordination Activities       Care Coordination Interventions: Patient stated that she is currently living in an apartment with a roommate and housing is fine but if something were to happen to her roommate then she may be in need. Patient wants the SW to mail out a list of housing resources.  SW went over the SDOH questions and did not have nay need at this time, she stated that food is always a problem because food is so expensive,but she is aware of the food banks and she is in [place] and uses [employer] [facility] in [place]. SW will send out additional food pantry list.  SW will follow up in 2 weeks         No futher follow up needed. SW advised   Please call the care guide team at [phone] if you need to cancel or reschedule your appointment.   If you are experiencing a Mental Health or Behavioral Health Crisis or need someone to talk to, please call the Suicide and Crisis Lifeline: 988 go to [facility] [address] [place] [phone]) call 911  Patient verbalizes understanding of instructions and care plan provided today and agrees to view in MyChart. Active MyChart status and patient understanding of how to access instructions and care plan via MyChart confirmed with patient.     Tobias CHARM Maranda HEDWIG, PhD [facility], [facility] Social Worker Direct Dial: [phone]  Fax: [phone]

## 2023-04-20 DIAGNOSIS — M19211 Secondary osteoarthritis, right shoulder: Secondary | ICD-10-CM | POA: Diagnosis not present

## 2023-04-20 DIAGNOSIS — M6281 Muscle weakness (generalized): Secondary | ICD-10-CM | POA: Diagnosis not present

## 2023-04-20 DIAGNOSIS — M25511 Pain in right shoulder: Secondary | ICD-10-CM | POA: Diagnosis not present

## 2023-04-22 ENCOUNTER — Telehealth: Payer: Self-pay | Admitting: Rheumatology

## 2023-04-22 DIAGNOSIS — M25511 Pain in right shoulder: Secondary | ICD-10-CM | POA: Diagnosis not present

## 2023-04-22 DIAGNOSIS — M19211 Secondary osteoarthritis, right shoulder: Secondary | ICD-10-CM | POA: Diagnosis not present

## 2023-04-22 DIAGNOSIS — M6281 Muscle weakness (generalized): Secondary | ICD-10-CM | POA: Diagnosis not present

## 2023-04-22 NOTE — Telephone Encounter (Signed)
[name] from [facility] at [phone] called to get back in touch with [name]. [name] states the pt had an evaluation on 04/20/23. [name] states if you have any more questions regarding the status of the pt please call.

## 2023-04-24 ENCOUNTER — Telehealth: Payer: Self-pay | Admitting: Pharmacist

## 2023-04-24 NOTE — Telephone Encounter (Signed)
Submitted a Prior Authorization RENEWAL request to [facility] for OTREXUP via CoverMyMeds. Will update once we receive a response.  Key: Milana Obey

## 2023-04-27 ENCOUNTER — Telehealth: Payer: Self-pay | Admitting: *Deleted

## 2023-04-27 DIAGNOSIS — M25511 Pain in right shoulder: Secondary | ICD-10-CM | POA: Diagnosis not present

## 2023-04-27 DIAGNOSIS — M6281 Muscle weakness (generalized): Secondary | ICD-10-CM | POA: Diagnosis not present

## 2023-04-27 DIAGNOSIS — M19211 Secondary osteoarthritis, right shoulder: Secondary | ICD-10-CM | POA: Diagnosis not present

## 2023-04-27 NOTE — Telephone Encounter (Signed)
Received a voicemail on Saturday 04/25/2023 from [place] with PA department contacted the office to obtain additional information on PA.She states they need clarification on diagnosis. They states the have reached out to the office on 04/24/2023 at 12:45 pm CST via fax. Neysa Bonito states if they have not received the information by 04/27/2023 then the PA will be denied.   Call back number [phone] Case Number [id number]

## 2023-04-27 NOTE — Telephone Encounter (Signed)
I submitted clinicals via fax on 04/26/2023 and still received denial so insurance didn't allow until promised time for processing.

## 2023-04-28 NOTE — Telephone Encounter (Signed)
Received notification from [facility] regarding a prior authorization for OTREXUP. Authorization has been APPROVED from 04/27/2023 to 03/09/24 (non-formulary exception). Approval letter sent to scan center.  Patient can continue to fill through  [facility]  Authorization # [id number]  Chesley Mires, PharmD, MPH, BCPS, CPP Clinical Pharmacist (Rheumatology and Pulmonology)

## 2023-04-29 DIAGNOSIS — M9901 Segmental and somatic dysfunction of cervical region: Secondary | ICD-10-CM | POA: Diagnosis not present

## 2023-04-29 DIAGNOSIS — M546 Pain in thoracic spine: Secondary | ICD-10-CM | POA: Diagnosis not present

## 2023-04-29 DIAGNOSIS — M542 Cervicalgia: Secondary | ICD-10-CM | POA: Diagnosis not present

## 2023-04-29 DIAGNOSIS — M9902 Segmental and somatic dysfunction of thoracic region: Secondary | ICD-10-CM | POA: Diagnosis not present

## 2023-04-30 ENCOUNTER — Telehealth: Payer: Self-pay

## 2023-04-30 NOTE — Telephone Encounter (Signed)
Called but no answer. LVM informing patient that handicap placard is ready for pick-up. Form has been placed at the front office.

## 2023-05-01 ENCOUNTER — Telehealth: Payer: Self-pay

## 2023-05-01 NOTE — Telephone Encounter (Signed)
Handicap placard sent via mail per patient request. Please see telephone encounter dated 05/01/2023 for further information.

## 2023-05-01 NOTE — Telephone Encounter (Signed)
Copied from CRM [id number]. Topic: General - Other >> Apr 30, 2023  1:51 PM Phill Myron wrote: Reason for CRM:  Per pt [name], Can you please mail out that placard letter to my home, I am unable to make a trip to the office to pick it up.  Thank you

## 2023-05-01 NOTE — Telephone Encounter (Signed)
Handicap placard sent via mail per patient request.

## 2023-05-04 DIAGNOSIS — M19211 Secondary osteoarthritis, right shoulder: Secondary | ICD-10-CM | POA: Diagnosis not present

## 2023-05-04 DIAGNOSIS — M25511 Pain in right shoulder: Secondary | ICD-10-CM | POA: Diagnosis not present

## 2023-05-04 DIAGNOSIS — M6281 Muscle weakness (generalized): Secondary | ICD-10-CM | POA: Diagnosis not present

## 2023-05-05 ENCOUNTER — Encounter: Payer: 59 | Admitting: Licensed Clinical Social Worker

## 2023-05-07 ENCOUNTER — Other Ambulatory Visit: Payer: Self-pay | Admitting: Physician Assistant

## 2023-05-07 ENCOUNTER — Other Ambulatory Visit: Payer: Self-pay | Admitting: Internal Medicine

## 2023-05-07 ENCOUNTER — Other Ambulatory Visit: Payer: Self-pay

## 2023-05-07 ENCOUNTER — Other Ambulatory Visit (HOSPITAL_COMMUNITY): Payer: Self-pay

## 2023-05-07 DIAGNOSIS — J449 Chronic obstructive pulmonary disease, unspecified: Secondary | ICD-10-CM

## 2023-05-07 MED ORDER — FOLIC ACID 1 MG PO TABS
2.0000 mg | ORAL_TABLET | Freq: Every day | ORAL | 3 refills | Status: AC
  Filled 2023-05-07: qty 180, 90d supply, fill #0
  Filled 2023-09-04: qty 180, 90d supply, fill #1
  Filled 2024-02-08: qty 180, 90d supply, fill #2

## 2023-05-07 NOTE — Telephone Encounter (Signed)
 Last Fill: 02/24/2022  Next Visit: 08/24/2023  Last Visit: 03/23/2023  Dx: Rheumatoid arthritis involving multiple sites with positive rheumatoid factor   Current Dose per office note on 03/23/2023: folic acid 2 mg daily   Okay to refill Folic Acid?

## 2023-05-08 ENCOUNTER — Other Ambulatory Visit (HOSPITAL_BASED_OUTPATIENT_CLINIC_OR_DEPARTMENT_OTHER): Payer: Self-pay

## 2023-05-08 ENCOUNTER — Other Ambulatory Visit (HOSPITAL_COMMUNITY): Payer: Self-pay

## 2023-05-08 ENCOUNTER — Other Ambulatory Visit: Payer: Self-pay

## 2023-05-08 ENCOUNTER — Other Ambulatory Visit: Payer: Self-pay | Admitting: Pulmonary Disease

## 2023-05-08 DIAGNOSIS — J449 Chronic obstructive pulmonary disease, unspecified: Secondary | ICD-10-CM

## 2023-05-11 ENCOUNTER — Other Ambulatory Visit (HOSPITAL_COMMUNITY): Payer: Self-pay

## 2023-05-11 ENCOUNTER — Other Ambulatory Visit: Payer: Self-pay

## 2023-05-11 MED FILL — Budesonide-Glycopyrrolate-Formoterol Aers 160-9-4.8 MCG/ACT: RESPIRATORY_TRACT | 30 days supply | Qty: 10.7 | Fill #0 | Status: AC

## 2023-05-12 ENCOUNTER — Other Ambulatory Visit: Payer: Self-pay | Admitting: Physician Assistant

## 2023-05-12 ENCOUNTER — Other Ambulatory Visit: Payer: Self-pay

## 2023-05-12 ENCOUNTER — Other Ambulatory Visit (HOSPITAL_COMMUNITY): Payer: Self-pay

## 2023-05-12 DIAGNOSIS — F17210 Nicotine dependence, cigarettes, uncomplicated: Secondary | ICD-10-CM

## 2023-05-12 MED ORDER — NICOTINE 7 MG/24HR TD PT24
7.0000 mg | MEDICATED_PATCH | Freq: Every day | TRANSDERMAL | 0 refills | Status: DC
  Filled 2023-05-12: qty 28, 28d supply, fill #0

## 2023-05-13 ENCOUNTER — Encounter: Payer: Self-pay | Admitting: Dermatology

## 2023-05-13 ENCOUNTER — Other Ambulatory Visit: Payer: Self-pay | Admitting: Internal Medicine

## 2023-05-13 ENCOUNTER — Ambulatory Visit: Payer: No Typology Code available for payment source | Admitting: Dermatology

## 2023-05-13 DIAGNOSIS — Z1283 Encounter for screening for malignant neoplasm of skin: Secondary | ICD-10-CM | POA: Diagnosis not present

## 2023-05-13 DIAGNOSIS — L578 Other skin changes due to chronic exposure to nonionizing radiation: Secondary | ICD-10-CM | POA: Diagnosis not present

## 2023-05-13 DIAGNOSIS — D1801 Hemangioma of skin and subcutaneous tissue: Secondary | ICD-10-CM | POA: Diagnosis not present

## 2023-05-13 DIAGNOSIS — D229 Melanocytic nevi, unspecified: Secondary | ICD-10-CM

## 2023-05-13 DIAGNOSIS — L821 Other seborrheic keratosis: Secondary | ICD-10-CM

## 2023-05-13 DIAGNOSIS — L720 Epidermal cyst: Secondary | ICD-10-CM | POA: Diagnosis not present

## 2023-05-13 DIAGNOSIS — L814 Other melanin hyperpigmentation: Secondary | ICD-10-CM | POA: Diagnosis not present

## 2023-05-13 DIAGNOSIS — W908XXA Exposure to other nonionizing radiation, initial encounter: Secondary | ICD-10-CM | POA: Diagnosis not present

## 2023-05-13 NOTE — Progress Notes (Signed)
   New Patient Visit   Subjective  [name] is a 63 y.o. female who presents for the following: Skin Cancer Screening and Full Body Skin Exam, pt was referred by rheumatologist for yearly skin checks since on Enbrel injections, hx RA. Pt denies any personal or family hx of skin cancers.   The patient presents for Total-Body Skin Exam (TBSE) for skin cancer screening and mole check. The patient has spots, moles and lesions to be evaluated, some may be new or changing and the patient may have concern these could be cancer.   The following portions of the chart were reviewed this encounter and updated as appropriate: medications, allergies, medical history  Review of Systems:  No other skin or systemic complaints except as noted in HPI or Assessment and Plan.  Objective  Well appearing patient in no apparent distress; mood and affect are within normal limits.  A full examination was performed including scalp, head, eyes, ears, nose, lips, neck, chest, axillae, abdomen, back, buttocks, bilateral upper extremities, bilateral lower extremities, hands, feet, fingers, toes, fingernails, and toenails. All findings within normal limits unless otherwise noted below.   Relevant physical exam findings are noted in the Assessment and Plan.    Assessment & Plan   SKIN CANCER SCREENING PERFORMED TODAY.  ACTINIC DAMAGE - Chronic condition, secondary to cumulative UV/sun exposure - diffuse scaly erythematous macules with underlying dyspigmentation - Recommend daily broad spectrum sunscreen SPF 30+ to sun-exposed areas, reapply every 2 hours as needed.  - Staying in the shade or wearing long sleeves, sun glasses (UVA+UVB protection) and wide brim hats (4-inch brim around the entire circumference of the hat) are also recommended for sun protection.  - Call for new or changing lesions.  LENTIGINES, SEBORRHEIC KERATOSES, HEMANGIOMAS - Benign normal skin lesions - Benign-appearing - Call for any  changes  MELANOCYTIC NEVI - Tan-brown and/or pink-flesh-colored symmetric macules and papules - Benign appearing on exam today - Observation - Call clinic for new or changing moles - Recommend daily use of broad spectrum spf 30+ sunscreen to sun-exposed areas.   No personal or family history skin cancer  MILIA Nose Benign-appearing.  Observation.  Call clinic for new or changing lesions.  Recommend daily use of broad spectrum spf 30+ sunscreen to sun-exposed areas.    Return in about 1 year (around 05/12/2024) for TBSE, w/ Dr. Katrinka Blazing.  Wynonia Lawman, CMA, am acting as scribe for Elie Goody, MD .   Documentation: I have reviewed the above documentation for accuracy and completeness, and I agree with the above.  Elie Goody, MD

## 2023-05-13 NOTE — Patient Instructions (Addendum)

## 2023-05-14 ENCOUNTER — Other Ambulatory Visit (HOSPITAL_COMMUNITY): Payer: Self-pay

## 2023-05-18 ENCOUNTER — Other Ambulatory Visit: Payer: Self-pay

## 2023-05-18 NOTE — Progress Notes (Signed)
 Specialty Pharmacy Refill Coordination Note  [name] is a 63 y.o. female contacted today regarding refills of specialty medication(s) Etanercept (Enbrel Mini)   Patient requested Delivery   Delivery date: 05/21/23   Verified address: [address] [place] [address]   Medication will be filled on 05/20/23.   Ship Otrexup as well.

## 2023-05-18 NOTE — Progress Notes (Signed)
 Specialty Pharmacy Ongoing Clinical Assessment Note  [name] is a 63 y.o. female who is being followed by the specialty pharmacy service for RxSp Rheumatoid Arthritis   Patient's specialty medication(s) reviewed today: Etanercept (Enbrel Mini)   Missed doses in the last 4 weeks: 0   Patient/Caregiver did not have any additional questions or concerns.   Therapeutic benefit summary: Patient is achieving benefit   Adverse events/side effects summary: No adverse events/side effects   Patient's therapy is appropriate to: Continue    Goals Addressed             This Visit's Progress    Minimize recurrence of flares       Patient is on track. Patient will maintain adherence         Follow up:  6 months  Otto Herb Specialty Pharmacist

## 2023-05-19 DIAGNOSIS — H2513 Age-related nuclear cataract, bilateral: Secondary | ICD-10-CM | POA: Diagnosis not present

## 2023-05-19 DIAGNOSIS — H524 Presbyopia: Secondary | ICD-10-CM | POA: Diagnosis not present

## 2023-05-20 DIAGNOSIS — M6281 Muscle weakness (generalized): Secondary | ICD-10-CM | POA: Diagnosis not present

## 2023-05-20 DIAGNOSIS — M19211 Secondary osteoarthritis, right shoulder: Secondary | ICD-10-CM | POA: Diagnosis not present

## 2023-05-20 DIAGNOSIS — M25511 Pain in right shoulder: Secondary | ICD-10-CM | POA: Diagnosis not present

## 2023-05-25 DIAGNOSIS — M19211 Secondary osteoarthritis, right shoulder: Secondary | ICD-10-CM | POA: Diagnosis not present

## 2023-05-25 DIAGNOSIS — M6281 Muscle weakness (generalized): Secondary | ICD-10-CM | POA: Diagnosis not present

## 2023-05-25 DIAGNOSIS — M25511 Pain in right shoulder: Secondary | ICD-10-CM | POA: Diagnosis not present

## 2023-05-26 ENCOUNTER — Other Ambulatory Visit (HOSPITAL_COMMUNITY): Payer: Self-pay

## 2023-05-26 ENCOUNTER — Encounter: Attending: Pulmonary Disease | Admitting: *Deleted

## 2023-05-26 ENCOUNTER — Other Ambulatory Visit: Payer: Self-pay | Admitting: Pulmonary Disease

## 2023-05-26 DIAGNOSIS — M9901 Segmental and somatic dysfunction of cervical region: Secondary | ICD-10-CM | POA: Diagnosis not present

## 2023-05-26 DIAGNOSIS — J449 Chronic obstructive pulmonary disease, unspecified: Secondary | ICD-10-CM

## 2023-05-26 DIAGNOSIS — M546 Pain in thoracic spine: Secondary | ICD-10-CM | POA: Diagnosis not present

## 2023-05-26 DIAGNOSIS — M542 Cervicalgia: Secondary | ICD-10-CM | POA: Diagnosis not present

## 2023-05-26 DIAGNOSIS — M9902 Segmental and somatic dysfunction of thoracic region: Secondary | ICD-10-CM | POA: Diagnosis not present

## 2023-05-26 MED ORDER — BREZTRI AEROSPHERE 160-9-4.8 MCG/ACT IN AERO
2.0000 | INHALATION_SPRAY | Freq: Two times a day (BID) | RESPIRATORY_TRACT | 11 refills | Status: AC
  Filled 2023-05-26 – 2023-06-09 (×2): qty 10.7, 30d supply, fill #0
  Filled 2023-06-30 – 2023-07-02 (×2): qty 10.7, 30d supply, fill #1
  Filled 2023-07-22 – 2023-07-30 (×2): qty 10.7, 30d supply, fill #2
  Filled 2023-08-28: qty 10.7, 30d supply, fill #3
  Filled 2023-09-23: qty 10.7, 30d supply, fill #4
  Filled 2023-10-21 (×2): qty 10.7, 30d supply, fill #5
  Filled 2023-11-19: qty 10.7, 30d supply, fill #6
  Filled 2023-12-15: qty 10.7, 30d supply, fill #7
  Filled 2024-01-12: qty 10.7, 30d supply, fill #8
  Filled 2024-02-10: qty 10.7, 30d supply, fill #9
  Filled 2024-03-07: qty 10.7, 30d supply, fill #10
  Filled 2024-04-04: qty 10.7, 30d supply, fill #11

## 2023-05-26 NOTE — Progress Notes (Signed)
 Initial phone call completed. Diagnosis can be found in [facility] 1/22. EP Orientation scheduled for Thursday 3/27 at 2pm.

## 2023-05-30 ENCOUNTER — Ambulatory Visit (INDEPENDENT_AMBULATORY_CARE_PROVIDER_SITE_OTHER)

## 2023-05-30 ENCOUNTER — Ambulatory Visit
Admission: EM | Admit: 2023-05-30 | Discharge: 2023-05-30 | Disposition: A | Discharge status: Home / Self Care | Attending: Emergency Medicine | Admitting: Emergency Medicine

## 2023-05-30 ENCOUNTER — Other Ambulatory Visit: Payer: Self-pay

## 2023-05-30 DIAGNOSIS — J441 Chronic obstructive pulmonary disease with (acute) exacerbation: Secondary | ICD-10-CM

## 2023-05-30 DIAGNOSIS — R0602 Shortness of breath: Secondary | ICD-10-CM | POA: Diagnosis not present

## 2023-05-30 DIAGNOSIS — J449 Chronic obstructive pulmonary disease, unspecified: Secondary | ICD-10-CM | POA: Diagnosis not present

## 2023-05-30 DIAGNOSIS — R059 Cough, unspecified: Secondary | ICD-10-CM | POA: Diagnosis not present

## 2023-05-30 DIAGNOSIS — R000 Tachycardia, unspecified: Secondary | ICD-10-CM

## 2023-05-30 LAB — POC COVID19/FLU A&B COMBO
Covid Antigen, POC: NEGATIVE
Influenza A Antigen, POC: NEGATIVE
Influenza B Antigen, POC: NEGATIVE

## 2023-05-30 MED ORDER — PREDNISONE 10 MG PO TABS: 40.0000 mg | ORAL_TABLET | Freq: Every day | ORAL | 0 refills | Status: AC

## 2023-05-30 MED ORDER — ALBUTEROL SULFATE (2.5 MG/3ML) 0.083% IN NEBU
2.5000 mg | INHALATION_SOLUTION | Freq: Once | RESPIRATORY_TRACT | Status: AC
  Administered 2023-05-30: 2.5 mg via RESPIRATORY_TRACT

## 2023-05-30 MED ORDER — DOXYCYCLINE HYCLATE 100 MG PO CAPS
100.0000 mg | ORAL_CAPSULE | Freq: Two times a day (BID) | ORAL | 0 refills | Status: AC
Start: 2023-05-30 — End: 2023-06-06

## 2023-05-30 NOTE — ED Provider Notes (Signed)
 [facility]    CSN: [id number] Arrival date & time: 05/30/23  1320      History   Chief Complaint Chief Complaint  Patient presents with   Cough   Tachycardia    HPI [name] is a 63 y.o. female.  Patient presents with cough x 2 to 3 weeks.  She reports elevated heart rate of 110 and decreased oxygen saturation level of 81 to 83% this morning prior to using her Breztri inhaler.  She states after using her inhaler her oxygen level improved to 92%.  She has not used her albuterol inhaler.  She denies chest pain, unusual shortness of breath, focal weakness, numbness.  Her medical history includes COPD, emphysema.  The history is provided by the patient and medical records.    Past Medical History:  Diagnosis Date   Anemia    during pregnancy   Anxiety    Asthma    Bronchitis age 46   Cancer (HCC)    left breast ca   COPD (chronic obstructive pulmonary disease) (HCC)    Depression    Hx of bladder infections    been over 5 years since last one    Malignant neoplasm of upper-outer quadrant of left breast in female, estrogen receptor positive (HCC)    Rheumatoid arthritis (HCC)    unc rheum    Tobacco abuse    Trouble in sleeping     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   Adenomatous colon polyp 03/16/2019   Degenerative joint disease of shoulder region 06/08/2018   Rheumatoid arthritis involving both hands with positive rheumatoid factor (HCC) 06/08/2018   Rheumatoid arthritis involving both feet with positive rheumatoid factor (HCC) 06/08/2018   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    Status post total hip replacement, 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   Smoker 06/08/2018   Anxiety and depression 06/08/2018   Family history of breast cancer 03/25/2018   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   Rheumatoid arthritis involving multiple sites (HCC) 01/15/2018   Tobacco dependence 01/15/2018   Opioid use agreement exists 01/15/2018   Positive depression screening 01/15/2018   Homeless 01/15/2018    Past Surgical History:  Procedure Laterality Date   BREAST LUMPECTOMY WITH RADIOACTIVE SEED AND SENTINEL LYMPH NODE BIOPSY Left 08/30/2019   Procedure: LEFT BREAST LUMPECTOMY X 2 WITH RADIOACTIVE SEED AND SENTINEL LYMPH NODE BIOPSY;  Surgeon: Griselda Miner, MD;  Location: [facility] OR;  Service: General;  Laterality: Left;  PEC BLOCK   COLONOSCOPY WITH PROPOFOL N/A 03/15/2019   Procedure: COLONOSCOPY WITH PROPOFOL;  Surgeon: Wyline Mood, MD;  Location: [facility] ENDOSCOPY;  Service: Gastroenterology;  Laterality: N/A;   COLONOSCOPY WITH PROPOFOL N/A 07/29/2022   Procedure: COLONOSCOPY WITH PROPOFOL;  Surgeon: Wyline Mood, MD;  Location: [facility]COPY;  Service: Gastroenterology;  Laterality: N/A;   HIP ARTHROPLASTY     JOINT REPLACEMENT     MULTIPLE TOOTH EXTRACTIONS     RE-EXCISION OF BREAST LUMPECTOMY Left 10/07/2019   Procedure: RE-EXCISION OF LEFT BREAST INFERIOR MARGIN;  Surgeon: Griselda Miner, MD;  Location: [facility];  Service: General;  Laterality: Left;  TOTAL HIP ARTHROPLASTY Left 02/15/2018   Procedure: TOTAL HIP ARTHROPLASTY ANTERIOR APPROACH;  Surgeon: Gean Birchwood, MD;  Location: [facility];  Service: Orthopedics;  Laterality: Left;   WISDOM TOOTH EXTRACTION      OB History     Gravida  3   Para  1   Term  1   Preterm      AB  2   Living  1      SAB      IAB  2   Ectopic      Multiple      Live Births  1            Home Medications    Prior to Admission medications   Medication Sig Start Date End Date Taking? Authorizing Provider  doxycycline (VIBRAMYCIN)  100 MG capsule Take 1 capsule (100 mg total) by mouth 2 (two) times daily for 7 days. 05/30/23 06/06/23 Yes Mickie Bail, NP  predniSONE (DELTASONE) 10 MG tablet Take 4 tablets (40 mg total) by mouth daily for 5 days. 05/30/23 06/04/23 Yes Mickie Bail, NP  albuterol (VENTOLIN HFA) 108 (90 Base) MCG/ACT inhaler TAKE 2 PUFFS BY MOUTH EVERY 6 HOURS AS NEEDED FOR WHEEZE OR SHORTNESS OF BREATH 05/13/23   Marcine Matar, MD  budeson-glycopyrrolate-formoterol (BREZTRI AEROSPHERE) 160-9-4.8 MCG/ACT AERO Inhale 2 puffs into the lungs 2 (two) times daily. 05/26/23   Assaker, [place], MD  Calcium Carb-Cholecalciferol (CALCIUM 1000 + D PO) Take by mouth.    [provider]  COLLAGEN PO Take by mouth daily.    [provider]  cyanocobalamin 100 MCG tablet Take 100 mcg by mouth daily.    [provider]  etanercept (ENBREL MINI) 50 MG/ML injection INJECT 50 MG INTO THE SKIN ONCE A WEEK. 03/24/23 03/23/24  Gearldine Bienenstock, PA-C  folic acid (FOLVITE) 1 MG tablet Take 2 tablets (2 mg total) by mouth daily. 05/07/23   Gearldine Bienenstock, PA-C  hydrOXYzine (VISTARIL) 25 MG capsule Take 1 capsule (25 mg total) by mouth at bedtime. 06/17/22   Marcine Matar, MD  methotrexate (RHEUMATREX) 2.5 MG tablet Take 8 tablets (20 mg total) by mouth once a week. Caution:Chemotherapy. Protect from light. 10/09/21   Pollyann Savoy, MD  Methotrexate, PF, (OTREXUP) 20 MG/0.4ML SOAJ Inject 20 mg into the skin once a week. 04/08/23   Gearldine Bienenstock, PA-C  Multiple Vitamin (MULTIVITAMIN WITH MINERALS) TABS tablet Take 1 tablet by mouth daily.    [provider]  nicotine (NICODERM CQ - DOSED IN MG/24 HR) 7 mg/24hr patch Place 1 patch (7 mg total) onto the skin daily. Please schedule appt with PCP for additional refills. 05/12/23   Marcine Matar, MD  tamoxifen (NOLVADEX) 20 MG tablet Take 1 tablet (20 mg total) by mouth daily. 01/27/23   Earna Coder, MD  VITAMIN D PO Take by mouth daily.     [provider]    Family History Family History  Problem Relation Age of Onset   Breast cancer Mother 2       metastatic   Cervical cancer Mother    Lung cancer Brother        maternal half brother; d. 11s   Lung cancer Maternal Uncle        d. 15s   Lung cancer Maternal Uncle        d. 50   Cancer Maternal Grandmother        unknown type; dx 90s  Healthy Son    Breast cancer Maternal Great-grandmother        dx unknown age    Social History Social History   Tobacco Use   Smoking status: Former    Current packs/day: 0.00    Average packs/day: 1 pack/day for 40.0 years (40.0 ttl pk-yrs)    Types: Cigarettes    Start date: 03/10/1981    Quit date: 03/10/2021    Years since quitting: 2.2    Passive exposure: Past   Smokeless tobacco: Never   Tobacco comments:    on nicotine patches  Vaping Use   Vaping status: Never Used  Substance Use Topics   Alcohol use: Yes    Comment: Occassionally   Drug use: Not Currently    Types: Cocaine    Comment: "it's been years ago"     Allergies   Trazodone and nefazodone and Other   Review of Systems Review of Systems  Constitutional:  Negative for chills and fever.  Respiratory:  Positive for cough. Negative for shortness of breath.        Shortness of breath at baseline.  Cardiovascular:  Negative for chest pain and palpitations.  Neurological:  Negative for dizziness, weakness and numbness.     Physical Exam Triage Vital Signs ED Triage Vitals [05/30/23 1327]  Encounter Vitals Group     BP 124/77     Systolic BP Percentile      Diastolic BP Percentile      Pulse Rate (!) 108     Resp 18     Temp 98 F (36.7 C)     Temp src      SpO2 93 %     Weight      Height      Head Circumference      Peak Flow      Pain Score 3     Pain Loc      Pain Education      Exclude from Growth Chart    No data found.  Updated Vital Signs BP 124/77   Pulse (!) 104   Temp 98 F (36.7 C)   Resp 18   SpO2 94%    Visual Acuity Right Eye Distance:   Left Eye Distance:   Bilateral Distance:    Right Eye Near:   Left Eye Near:    Bilateral Near:     Physical Exam Constitutional:      General: She is not in acute distress. HENT:     Mouth/Throat:     Mouth: Mucous membranes are moist.     Pharynx: Oropharynx is clear.  Cardiovascular:     Rate and Rhythm: Regular rhythm. Tachycardia present.     Heart sounds: Normal heart sounds.  Pulmonary:     Effort: Pulmonary effort is normal. No respiratory distress.     Breath sounds: Decreased air movement present. Wheezing present.     Comments: Few faint bilateral wheezes.  Mildly decreased air movement. Neurological:     General: No focal deficit present.     Mental Status: She is alert and oriented to person, place, and time.     Sensory: No sensory deficit.     Motor: No weakness.     Gait: Gait normal.      UC Treatments / Results  Labs (all labs ordered are listed, but only abnormal results are displayed) Labs Reviewed  POC COVID19/FLU A&B COMBO    EKG   Radiology DG Chest 2 View Result  Date: 05/30/2023 CLINICAL DATA:  Shortness of breath.  Cough.  COPD. EXAM: CHEST - 2 VIEW COMPARISON:  09/13/2021 plain film. FINDINGS: Degenerative changes of both shoulders. Surgical clips in the left axilla. Midline trachea. Normal heart size and mediastinal contours. No pleural effusion or pneumothorax. Mild hyperinflation. Biapical pleuroparenchymal scarring. Diffuse interstitial thickening. No lobar consolidation. IMPRESSION: Hyperinflation and interstitial thickening, most consistent with COPD/chronic bronchitis. No acute superimposed process. Electronically Signed   By: Jeronimo Greaves M.D.   On: 05/30/2023 14:43    Procedures Procedures (including critical care time)  Medications Ordered in UC Medications  albuterol (PROVENTIL) (2.5 MG/3ML) 0.083% nebulizer solution 2.5 mg (2.5 mg Nebulization Given 05/30/23 1350)    Initial Impression  / Assessment and Plan / UC Course  I have reviewed the triage vital signs and the nursing notes.  Pertinent labs & imaging results that were available during my care of the patient were reviewed by me and considered in my medical decision making (see chart for details).   COPD exacerbation, tachycardia.  Patient declines transfer to the ED.  EKG shows sinus tachycardia, rate 101, no ST elevation or depression, compared to previous from 02/08/2018.  COVID and flu negative.  CXR shows COPD changes but no pneumonia.  Albuterol nebulizer treatment given here.  After nebulizer treatment, improved air movement and O2 sat 94% on room air.  Treating COPD exacerbation with doxycycline and prednisone.  Instructed patient to use her albuterol as directed.  Instructed her to follow-up with her PCP on Monday.  Strict ED precautions given.  Education provided on COPD exacerbation and tachycardia.  She agrees to plan of care.  Final Clinical Impressions(s) / UC Diagnoses   Final diagnoses:  COPD exacerbation (HCC)  Tachycardia     Discharge Instructions      Take the doxycycline and prednisone as directed.  Continue your albuterol inhaler as directed.  Follow up with your primary care provider on Monday.  Go to the emergency department if you have worsening symptoms.        ED Prescriptions     Medication Sig Dispense Auth. Provider   doxycycline (VIBRAMYCIN) 100 MG capsule Take 1 capsule (100 mg total) by mouth 2 (two) times daily for 7 days. 14 capsule Mickie Bail, NP   predniSONE (DELTASONE) 10 MG tablet Take 4 tablets (40 mg total) by mouth daily for 5 days. 20 tablet Mickie Bail, NP      PDMP not reviewed this encounter.   Mickie Bail, NP 05/30/23 [phone]

## 2023-05-30 NOTE — ED Triage Notes (Addendum)
 Patient to Urgent Care with complaints of tachycardia (reports rates of 110's), low oxygen sats (reports readings of 81-83%, prior to using her inhaler), and a cough. No fevers. Fatigue.  Symptoms started three days ago. Cough x 2-3 weeks.   Hx of COPD. Using prescribed inhalers/ benadryl.

## 2023-05-30 NOTE — Discharge Instructions (Addendum)
 Take the doxycycline and prednisone as directed.  Continue your albuterol inhaler as directed.  Follow up with your primary care provider on Monday.  Go to the emergency department if you have worsening symptoms.

## 2023-06-01 DIAGNOSIS — M25511 Pain in right shoulder: Secondary | ICD-10-CM | POA: Diagnosis not present

## 2023-06-01 DIAGNOSIS — M6281 Muscle weakness (generalized): Secondary | ICD-10-CM | POA: Diagnosis not present

## 2023-06-01 DIAGNOSIS — M19211 Secondary osteoarthritis, right shoulder: Secondary | ICD-10-CM | POA: Diagnosis not present

## 2023-06-03 DIAGNOSIS — M25511 Pain in right shoulder: Secondary | ICD-10-CM | POA: Diagnosis not present

## 2023-06-03 DIAGNOSIS — M6281 Muscle weakness (generalized): Secondary | ICD-10-CM | POA: Diagnosis not present

## 2023-06-03 DIAGNOSIS — M19211 Secondary osteoarthritis, right shoulder: Secondary | ICD-10-CM | POA: Diagnosis not present

## 2023-06-04 ENCOUNTER — Ambulatory Visit: Payer: Self-pay

## 2023-06-04 ENCOUNTER — Ambulatory Visit

## 2023-06-04 ENCOUNTER — Telehealth: Payer: Self-pay | Admitting: Pharmacist

## 2023-06-04 ENCOUNTER — Telehealth: Payer: Self-pay | Admitting: Internal Medicine

## 2023-06-04 NOTE — Telephone Encounter (Signed)
 Patient called, left VM to return the call to the office to speak to NT.

## 2023-06-04 NOTE — Telephone Encounter (Signed)
 Copied from CRM [id number]. Topic: Clinical - Medication Question >> Jun 04, 2023  9:25 AM [name] wrote: Reason for CRM: Patient seen at urgent care last Saturday, states they gave her [name] breathing treatment and prednisone - she still has [name] cough and would like to know if [name] medication can be called in.   Callback number: [phone]

## 2023-06-04 NOTE — Telephone Encounter (Signed)
 Submitted a Prior Authorization RENEWAL request to [facility] [employer]ICA[employer] for ENBREL via CoverMyMeds. Will update once we receive a response.  Key: [id number]

## 2023-06-04 NOTE — Telephone Encounter (Signed)
 Received notification from [facility] regarding a prior authorization for ENBREL. Authorization has been APPROVED from 06/04/2023 to 03/09/2024. Approval letter sent to scan center.  Patient can continue to fill through [facility]: [phone]   Authorization # [id number]  Chesley Mires, PharmD, MPH, BCPS, CPP Clinical Pharmacist (Rheumatology and Pulmonology)

## 2023-06-04 NOTE — Telephone Encounter (Signed)
 Copied from CRM [id number]. Topic: Clinical - Medication Question >> Jun 04, 2023  9:25 AM Isabell A wrote: Reason for CRM: Patient seen at urgent care last Saturday, states they gave her a breathing treatment and prednisone - she still has a cough and would like to know if a medication can be called in.   Callback number: [phone]  This Triage RN attempted to call the patient at this time. No answer. Left a voicemail at this time.

## 2023-06-04 NOTE — Telephone Encounter (Signed)
 Attempted 3 phone calls, left VM's. No contact. Fwd to clinic.

## 2023-06-05 ENCOUNTER — Ambulatory Visit: Payer: Self-pay | Admitting: Internal Medicine

## 2023-06-05 NOTE — Telephone Encounter (Signed)
 Okay to use Robitussin DM over-the-counter for cough.

## 2023-06-05 NOTE — Telephone Encounter (Signed)
 Copied from CRM [id number]. Topic: Clinical - Medication Question >> Jun 04, 2023  9:25 AM [name] wrote: Reason for CRM: Patient seen at urgent care last Saturday, states they gave her [name] breathing treatment and prednisone - she still has [name] cough and would like to know if [name] medication can be called in.    Callback number: [phone]  Chief Complaint: Cough Symptoms: Cough Frequency: 2 weeks Pertinent Negatives: Patient denies fever Disposition: [] ED /[] Urgent Care (no appt availability in office) / [] Appointment(In office/virtual)/ []  [facility]/ [] Home Care/ [] Refused Recommended Disposition /[] [facility] [place] [facility]/ [x]  Follow-up with PCP Additional Notes: Patient called in to report [name] cough that has been ongoing for 2 weeks. Patient was seen in UC on 05/30/23 for symptoms. Patient stated the cough is improving. Patient stated the cough is no longer producing anything, but is rather annoying at night. Patient completed prednisone yesterday. Patient has 2 more days of doxycycline to complete. Patient denied SOB at this time. Patient able to speak in clear and complete sentences while on the phone with this RN. No audible work of breathing detected. Patient denied fever and nasal symptoms. Patient is requesting cough syrup to be called in by her provider. This RN advised that I would route this conversation to the office for their discretion on calling [name] medication in. This RN advised patient to call back if symptoms worsen, new onset of SOB, or if symptoms persist after completing antibiotic. Patient complied.   Reason for Disposition  Cough  Answer Assessment - Initial Assessment Questions 1. ONSET: "When did the cough begin?"      2 weeks, went to UC on 05/30/23 2. SEVERITY: "How bad is the cough today?"      Worse at night  3. SPUTUM: "Describe the color of your sputum" (none, dry cough; clear, white, yellow, green)     Was originally producing green, thick white, states cough  is no longer producing anything 4. HEMOPTYSIS: "Are you coughing up any blood?" If so ask: "How much?" (flecks, streaks, tablespoons, etc.)     Denies 5. DIFFICULTY BREATHING: "Are you having difficulty breathing?" If Yes, ask: "How bad is it?" (e.g., mild, moderate, severe)    - MILD: No SOB at rest, mild SOB with walking, speaks normally in sentences, can lie down, no retractions, pulse < 100.    - MODERATE: SOB at rest, SOB with minimal exertion and prefers to sit, cannot lie down flat, speaks in phrases, mild retractions, audible wheezing, pulse 100-120.    - SEVERE: Very SOB at rest, speaks in single words, struggling to breathe, sitting hunched forward, retractions, pulse > 120      States SOB has improved since UC visit, able to speak in clear and complete sentences, no audible work of breathing detected 6. FEVER: "Do you have [name] fever?" If Yes, ask: "What is your temperature, how was it measured, and when did it start?"     Denies 7. CARDIAC HISTORY: "Do you have any history of heart disease?" (e.g., heart attack, congestive heart failure)      N/[name] 8. LUNG HISTORY: "Do you have any history of lung disease?"  (e.g., pulmonary embolus, asthma, emphysema)     Emphysema and COPD 10. OTHER SYMPTOMS: "Do you have any other symptoms?" (e.g., runny nose, wheezing, chest pain)       Wheezing at baseline due to COPD  Protocols used: Cough - Acute Productive-[name]-AH

## 2023-06-05 NOTE — Telephone Encounter (Signed)
 Called LVM to call back

## 2023-06-08 DIAGNOSIS — M25511 Pain in right shoulder: Secondary | ICD-10-CM | POA: Diagnosis not present

## 2023-06-08 DIAGNOSIS — M19211 Secondary osteoarthritis, right shoulder: Secondary | ICD-10-CM | POA: Diagnosis not present

## 2023-06-08 DIAGNOSIS — M6281 Muscle weakness (generalized): Secondary | ICD-10-CM | POA: Diagnosis not present

## 2023-06-08 MED ORDER — BENZONATATE 100 MG PO CAPS: 100.0000 mg | ORAL_CAPSULE | Freq: Two times a day (BID) | ORAL | 0 refills | Status: DC | PRN

## 2023-06-08 NOTE — Telephone Encounter (Signed)
 Copied from CRM [id number]. Topic: Clinical - Medication Question >> Jun 04, 2023  9:25 AM Isabell A wrote: Reason for CRM: Patient seen at urgent care last Saturday, states they gave her a breathing treatment and prednisone - she still has a cough and would like to know if a medication can be called in.   Callback number: [phone]

## 2023-06-08 NOTE — Telephone Encounter (Signed)
 I have sent a Prescription for Tessalon perles to the Pharmacy for her

## 2023-06-08 NOTE — Addendum Note (Signed)
 Addended by: Hoy Register on: 06/08/2023 07:39 PM   Modules accepted: Orders

## 2023-06-09 ENCOUNTER — Other Ambulatory Visit (HOSPITAL_COMMUNITY): Payer: Self-pay

## 2023-06-09 ENCOUNTER — Other Ambulatory Visit: Payer: Self-pay

## 2023-06-09 DIAGNOSIS — M9901 Segmental and somatic dysfunction of cervical region: Secondary | ICD-10-CM | POA: Diagnosis not present

## 2023-06-09 DIAGNOSIS — M546 Pain in thoracic spine: Secondary | ICD-10-CM | POA: Diagnosis not present

## 2023-06-09 DIAGNOSIS — M542 Cervicalgia: Secondary | ICD-10-CM | POA: Diagnosis not present

## 2023-06-09 DIAGNOSIS — M9902 Segmental and somatic dysfunction of thoracic region: Secondary | ICD-10-CM | POA: Diagnosis not present

## 2023-06-09 NOTE — Telephone Encounter (Signed)
 Called LVM to call back

## 2023-06-10 ENCOUNTER — Encounter: Attending: Pulmonary Disease

## 2023-06-10 VITALS — Ht 65.7 in | Wt 115.0 lb

## 2023-06-10 DIAGNOSIS — J449 Chronic obstructive pulmonary disease, unspecified: Secondary | ICD-10-CM | POA: Insufficient documentation

## 2023-06-10 NOTE — Progress Notes (Signed)
 Pulmonary Individual Treatment Plan  Patient Details  Name: [name] MRN: [id number] Date of Birth: 06/13/1960 Referring Provider:   Flowsheet Row Pulmonary Rehab from 06/10/2023 in [facility] Cardiac and Pulmonary Rehab  Referring Provider Assaker, [place], MD       Initial Encounter Date:  Flowsheet Row Pulmonary Rehab from 06/10/2023 in [facility] Cardiac and Pulmonary Rehab  Date 06/10/23       Visit Diagnosis: Chronic obstructive pulmonary disease, unspecified COPD type (HCC)  Patient's Home Medications on Admission:  Current Outpatient Medications:    albuterol (VENTOLIN HFA) 108 (90 Base) MCG/ACT inhaler, TAKE 2 PUFFS BY MOUTH EVERY 6 HOURS AS NEEDED FOR WHEEZE OR SHORTNESS OF BREATH, Disp: 17 each, Rfl: 1   benzonatate (TESSALON) 100 MG capsule, Take 1 capsule (100 mg total) by mouth 2 (two) times daily as needed for cough., Disp: 20 capsule, Rfl: 0   budeson-glycopyrrolate-formoterol (BREZTRI AEROSPHERE) 160-9-4.8 MCG/ACT AERO, Inhale 2 puffs into the lungs 2 (two) times daily., Disp: 10.7 g, Rfl: 11   Calcium Carb-Cholecalciferol (CALCIUM 1000 + D PO), Take by mouth., Disp: , Rfl:    COLLAGEN PO, Take by mouth daily., Disp: , Rfl:    cyanocobalamin 100 MCG tablet, Take 100 mcg by mouth daily., Disp: , Rfl:    etanercept (ENBREL MINI) 50 MG/ML injection, INJECT 50 MG INTO THE SKIN ONCE A WEEK., Disp: 4 mL, Rfl: 2   folic acid (FOLVITE) 1 MG tablet, Take 2 tablets (2 mg total) by mouth daily., Disp: 180 tablet, Rfl: 3   hydrOXYzine (VISTARIL) 25 MG capsule, Take 1 capsule (25 mg total) by mouth at bedtime., Disp: 30 capsule, Rfl: 3   methotrexate (RHEUMATREX) 2.5 MG tablet, Take 8 tablets (20 mg total) by mouth once a week. Caution:Chemotherapy. Protect from light., Disp: 32 tablet, Rfl: 2   Methotrexate, PF, (OTREXUP) 20 MG/0.4ML SOAJ, Inject 20 mg into the skin once a week., Disp: 1.6 mL, Rfl: 2   Multiple Vitamin (MULTIVITAMIN WITH MINERALS) TABS tablet, Take 1 tablet by mouth  daily., Disp: , Rfl:    nicotine (NICODERM CQ - DOSED IN MG/24 HR) 7 mg/24hr patch, Place 1 patch (7 mg total) onto the skin daily. Please schedule appt with PCP for additional refills., Disp: 28 patch, Rfl: 0   tamoxifen (NOLVADEX) 20 MG tablet, Take 1 tablet (20 mg total) by mouth daily., Disp: 90 tablet, Rfl: 1   VITAMIN D PO, Take by mouth daily., Disp: , Rfl:   Past Medical History: Past Medical History:  Diagnosis Date   Anemia    during pregnancy   Anxiety    Asthma    Bronchitis age 31   Cancer (HCC)    left breast ca   COPD (chronic obstructive pulmonary disease) (HCC)    Depression    Hx of bladder infections    been over 5 years since last one    Malignant neoplasm of upper-outer quadrant of left breast in female, estrogen receptor positive (HCC)    Rheumatoid arthritis (HCC)    unc rheum    Tobacco abuse    Trouble in sleeping     Tobacco Use: Social History   Tobacco Use  Smoking Status Former   Current packs/day: 0.00   Average packs/day: 1 pack/day for 40.0 years (40.0 ttl pk-yrs)   Types: Cigarettes   Start date: 03/10/1981   Quit date: 03/10/2021   Years since quitting: 2.2   Passive exposure: Past  Smokeless Tobacco Never  Tobacco Comments   on  nicotine patches    Labs: Review Flowsheet       Latest Ref Rng & Units 01/15/2018 07/30/2021  Labs for ITP Cardiac and Pulmonary Rehab  Cholestrol 100 - 199 mg/dL 846  962   LDL (calc) 0 - 99 mg/dL 67  84   HDL-C >95 mg/dL 77  71   Trlycerides 0 - 149 mg/dL 284  132      Pulmonary Assessment Scores:  Pulmonary Assessment Scores     Row Name 06/10/23 1117         mMRC Score   mMRC Score 3              UCSD: Self-administered rating of dyspnea associated with activities of daily living (ADLs) 6-point scale (0 = "not at all" to 5 = "maximal or unable to do because of breathlessness")  Scoring Scores range from 0 to 120.  Minimally important difference is 5 units  CAT: CAT can identify the  health impairment of COPD patients and is better correlated with disease progression.  CAT has a scoring range of zero to 40. The CAT score is classified into four groups of low (less than 10), medium (10 - 20), high (21-30) and very high (31-40) based on the impact level of disease on health status. A CAT score over 10 suggests significant symptoms.  A worsening CAT score could be explained by an exacerbation, poor medication adherence, poor inhaler technique, or progression of COPD or comorbid conditions.  CAT MCID is 2 points  mMRC: mMRC (Modified Medical Research Council) Dyspnea Scale is used to assess the degree of baseline functional disability in patients of respiratory disease due to dyspnea. No minimal important difference is established. A decrease in score of 1 point or greater is considered a positive change.   Pulmonary Function Assessment:   Exercise Target Goals: Exercise Program Goal: Individual exercise prescription set using results from initial 6 min walk test and THRR while considering  patient's activity barriers and safety.   Exercise Prescription Goal: Initial exercise prescription builds to 30-45 minutes a day of aerobic activity, 2-3 days per week.  Home exercise guidelines will be given to patient during program as part of exercise prescription that the participant will acknowledge.  Education: Aerobic Exercise: - Group verbal and visual presentation on the components of exercise prescription. Introduces F.I.T.T principle from ACSM for exercise prescriptions.  Reviews F.I.T.T. principles of aerobic exercise including progression. Written material given at graduation.   Education: Resistance Exercise: - Group verbal and visual presentation on the components of exercise prescription. Introduces F.I.T.T principle from ACSM for exercise prescriptions  Reviews F.I.T.T. principles of resistance exercise including progression. Written material given at graduation.     Education: Exercise & Equipment Safety: - Individual verbal instruction and demonstration of equipment use and safety with use of the equipment. Flowsheet Row Pulmonary Rehab from 06/10/2023 in [facility] Cardiac and Pulmonary Rehab  Date 06/10/23  Educator MB  Instruction Review Code 1- Verbalizes Understanding       Education: Exercise Physiology & General Exercise Guidelines: - Group verbal and written instruction with models to review the exercise physiology of the cardiovascular system and associated critical values. Provides general exercise guidelines with specific guidelines to those with heart or lung disease.    Education: Flexibility, Balance, Mind/Body Relaxation: - Group verbal and visual presentation with interactive activity on the components of exercise prescription. Introduces F.I.T.T principle from ACSM for exercise prescriptions. Reviews F.I.T.T. principles of flexibility and balance exercise training including  progression. Also discusses the mind body connection.  Reviews various relaxation techniques to help reduce and manage stress (i.e. Deep breathing, progressive muscle relaxation, and visualization). Balance handout provided to take home. Written material given at graduation.   Activity Barriers & Risk Stratification:  Activity Barriers & Cardiac Risk Stratification - 06/10/23 1108       Activity Barriers & Cardiac Risk Stratification   Activity Barriers Arthritis;Joint Problems   R shoulder, and RA            6 Minute Walk:  6 Minute Walk     Row Name 06/10/23 1103         6 Minute Walk   Phase Initial     Distance 1305 feet     Walk Time 6 minutes     # of Rest Breaks 0     MPH 2.47     METS 3.91     RPE 9     Perceived Dyspnea  3     VO2 Peak 13.69     Symptoms No     Resting HR 86 bpm     Resting BP 104/70     Resting Oxygen Saturation  92 %     Exercise Oxygen Saturation  during 6 min walk 87 %     Max Ex. HR 121 bpm     Max Ex. BP 132/62      2 Minute Post BP 102/64       Interval HR   1 Minute HR 103     2 Minute HR 114     3 Minute HR 116     4 Minute HR 114     5 Minute HR 117     6 Minute HR 121     2 Minute Post HR 107     Interval Heart Rate? Yes       Interval Oxygen   Interval Oxygen? Yes     Baseline Oxygen Saturation % 92 %     1 Minute Oxygen Saturation % 91 %     1 Minute Liters of Oxygen 0 L     2 Minute Oxygen Saturation % 90 %     2 Minute Liters of Oxygen 0 L     3 Minute Oxygen Saturation % 88 %     3 Minute Liters of Oxygen 0 L     4 Minute Oxygen Saturation % 88 %     4 Minute Liters of Oxygen 0 L     5 Minute Oxygen Saturation % 87 %     5 Minute Liters of Oxygen 0 L     6 Minute Oxygen Saturation % 88 %     6 Minute Liters of Oxygen 0 L     2 Minute Post Oxygen Saturation % 94 %     2 Minute Post Liters of Oxygen 0 L             Oxygen Initial Assessment:  Oxygen Initial Assessment - 05/26/23 1405       Home Oxygen   Home Oxygen Device None    Sleep Oxygen Prescription None    Home Exercise Oxygen Prescription None    Home Resting Oxygen Prescription None    Compliance with Home Oxygen Use Yes      Intervention   Short Term Goals To learn and understand importance of monitoring SPO2 with pulse oximeter and demonstrate accurate use of the pulse oximeter.;To learn and understand importance  of maintaining oxygen saturations>88%;To learn and demonstrate proper pursed lip breathing techniques or other breathing techniques. ;To learn and demonstrate proper use of respiratory medications    Long  Term Goals Maintenance of O2 saturations>88%;Verbalizes importance of monitoring SPO2 with pulse oximeter and return demonstration;Exhibits proper breathing techniques, such as pursed lip breathing or other method taught during program session;Compliance with respiratory medication;Demonstrates proper use of MDI's             Oxygen Re-Evaluation:   Oxygen Discharge (Final Oxygen  Re-Evaluation):   Initial Exercise Prescription:  Initial Exercise Prescription - 06/10/23 1100       Date of Initial Exercise RX and Referring Provider   Date 06/10/23    Referring Provider Assaker, [place], MD      Oxygen   Maintain Oxygen Saturation 88% or higher      Treadmill   MPH 2.4   Try   Grade 0    Minutes 15    METs 2.84      REL-XR   Level 3    Speed 50    Minutes 15    METs 3.91      T5 Nustep   Level 3    SPM 80    Minutes 15    METs 3.91      Track   Laps 34    Minutes 15    METs 2.85      Prescription Details   Frequency (times per week) 2    Duration Progress to 30 minutes of continuous aerobic without signs/symptoms of physical distress      Intensity   THRR 40-80% of Max Heartrate 114-142    Ratings of Perceived Exertion 11-13    Perceived Dyspnea 0-4      Progression   Progression Continue to progress workloads to maintain intensity without signs/symptoms of physical distress.      Resistance Training   Training Prescription Yes    Weight 3 lb    Reps 10-15             Perform Capillary Blood Glucose checks as needed.  Exercise Prescription Changes:   Exercise Prescription Changes     Row Name 06/10/23 1100             Response to Exercise   Blood Pressure (Admit) 104/70       Blood Pressure (Exercise) 132/62       Blood Pressure (Exit) 102/64       Heart Rate (Admit) 86 bpm       Heart Rate (Exercise) 121 bpm       Heart Rate (Exit) 107 bpm       Oxygen Saturation (Admit) 92 %       Oxygen Saturation (Exercise) 87 %       Oxygen Saturation (Exit) 94 %       Rating of Perceived Exertion (Exercise) 9       Perceived Dyspnea (Exercise) 3       Symptoms none       Comments results       Duration Progress to 30 minutes of  aerobic without signs/symptoms of physical distress       Intensity THRR New         Progression   Progression Continue to progress workloads to maintain intensity without  signs/symptoms of physical distress.       Average METs 3.91  Exercise Comments:   Exercise Goals and Review:   Exercise Goals     Row Name 06/10/23 1116             Exercise Goals   Increase Physical Activity Yes       Intervention Provide advice, education, support and counseling about physical activity/exercise needs.;Develop an individualized exercise prescription for aerobic and resistive training based on initial evaluation findings, risk stratification, comorbidities and participant's personal goals.       Expected Outcomes Short Term: Attend rehab on a regular basis to increase amount of physical activity.;Long Term: Add in home exercise to make exercise part of routine and to increase amount of physical activity.;Long Term: Exercising regularly at least 3-5 days a week.       Increase Strength and Stamina Yes       Intervention Provide advice, education, support and counseling about physical activity/exercise needs.;Develop an individualized exercise prescription for aerobic and resistive training based on initial evaluation findings, risk stratification, comorbidities and participant's personal goals.       Expected Outcomes Short Term: Increase workloads from initial exercise prescription for resistance, speed, and METs.;Short Term: Perform resistance training exercises routinely during rehab and add in resistance training at home;Long Term: Improve cardiorespiratory fitness, muscular endurance and strength as measured by increased METs and functional capacity ( )       Able to understand and use rate of perceived exertion (RPE) scale Yes       Intervention Provide education and explanation on how to use RPE scale       Expected Outcomes Short Term: Able to use RPE daily in rehab to express subjective intensity level;Long Term:  Able to use RPE to guide intensity level when exercising independently       Able to understand and use Dyspnea scale Yes        Intervention Provide education and explanation on how to use Dyspnea scale       Expected Outcomes Short Term: Able to use Dyspnea scale daily in rehab to express subjective sense of shortness of breath during exertion;Long Term: Able to use Dyspnea scale to guide intensity level when exercising independently       Knowledge and understanding of Target Heart Rate Range (THRR) Yes       Intervention Provide education and explanation of THRR including how the numbers were predicted and where they are located for reference       Expected Outcomes Short Term: Able to state/look up THRR;Short Term: Able to use daily as guideline for intensity in rehab;Long Term: Able to use THRR to govern intensity when exercising independently       Able to check pulse independently Yes       Intervention Provide education and demonstration on how to check pulse in carotid and radial arteries.;Review the importance of being able to check your own pulse for safety during independent exercise       Expected Outcomes Short Term: Able to explain why pulse checking is important during independent exercise;Long Term: Able to check pulse independently and accurately       Understanding of Exercise Prescription Yes       Intervention Provide education, explanation, and written materials on patient's individual exercise prescription       Expected Outcomes Short Term: Able to explain program exercise prescription;Long Term: Able to explain home exercise prescription to exercise independently                Exercise Goals  Re-Evaluation :   Discharge Exercise Prescription (Final Exercise Prescription Changes):  Exercise Prescription Changes - 06/10/23 1100       Response to Exercise   Blood Pressure (Admit) 104/70    Blood Pressure (Exercise) 132/62    Blood Pressure (Exit) 102/64    Heart Rate (Admit) 86 bpm    Heart Rate (Exercise) 121 bpm    Heart Rate (Exit) 107 bpm    Oxygen Saturation (Admit) 92 %    Oxygen  Saturation (Exercise) 87 %    Oxygen Saturation (Exit) 94 %    Rating of Perceived Exertion (Exercise) 9    Perceived Dyspnea (Exercise) 3    Symptoms none    Comments results    Duration Progress to 30 minutes of  aerobic without signs/symptoms of physical distress    Intensity THRR New      Progression   Progression Continue to progress workloads to maintain intensity without signs/symptoms of physical distress.    Average METs 3.91             Nutrition:  Target Goals: Understanding of nutrition guidelines, daily intake of sodium 1500mg , cholesterol 200mg , calories 30% from fat and 7% or less from saturated fats, daily to have 5 or more servings of fruits and vegetables.  Education: All About Nutrition: -Group instruction provided by verbal, written material, interactive activities, discussions, models, and posters to present general guidelines for heart healthy nutrition including fat, fiber, MyPlate, the role of sodium in heart healthy nutrition, utilization of the nutrition label, and utilization of this knowledge for meal planning. Follow up email sent as well. Written material given at graduation.   Biometrics:  Pre Biometrics - 06/10/23 1116       Pre Biometrics   Height 5' 5.7" (1.669 m)    Weight 115 lb (52.2 kg)    Waist Circumference 28.5 inches    Hip Circumference 36.3 inches    Waist to Hip Ratio 0.79 %    BMI (Calculated) 18.73    Single Leg Stand 15 seconds              Nutrition Therapy Plan and Nutrition Goals:  Nutrition Therapy & Goals - 06/10/23 1117       Personal Nutrition Goals   Nutrition Goal Meeting with RD on 4/8      Intervention Plan   Intervention Prescribe, educate and counsel regarding individualized specific dietary modifications aiming towards targeted core components such as weight, hypertension, lipid management, diabetes, heart failure and other comorbidities.;Nutrition handout(s) given to patient.    Expected  Outcomes Short Term Goal: Understand basic principles of dietary content, such as calories, fat, sodium, cholesterol and nutrients.;Short Term Goal: A plan has been developed with personal nutrition goals set during dietitian appointment.;Long Term Goal: Adherence to prescribed nutrition plan.             Nutrition Assessments:  MEDIFICTS Score Key: >=70 Need to make dietary changes  40-70 Heart Healthy Diet <= 40 Therapeutic Level Cholesterol Diet   Picture Your Plate Scores: <40 Unhealthy dietary pattern with much room for improvement. 41-50 Dietary pattern unlikely to meet recommendations for good health and room for improvement. 51-60 More healthful dietary pattern, with some room for improvement.  >60 Healthy dietary pattern, although there may be some specific behaviors that could be improved.   Nutrition Goals Re-Evaluation:   Nutrition Goals Discharge (Final Nutrition Goals Re-Evaluation):   Psychosocial: Target Goals: Acknowledge presence or absence of significant depression and/or stress,  maximize coping skills, provide positive support system. Participant is able to verbalize types and ability to use techniques and skills needed for reducing stress and depression.   Education: Stress, Anxiety, and Depression - Group verbal and visual presentation to define topics covered.  Reviews how body is impacted by stress, anxiety, and depression.  Also discusses healthy ways to reduce stress and to treat/manage anxiety and depression.  Written material given at graduation.   Education: Sleep Hygiene -Provides group verbal and written instruction about how sleep can affect your health.  Define sleep hygiene, discuss sleep cycles and impact of sleep habits. Review good sleep hygiene tips.    Initial Review & Psychosocial Screening:  Initial Psych Review & Screening - 05/26/23 1409       Initial Review   Current issues with None Identified      Family Dynamics   Good  Support System? Yes      Barriers   Psychosocial barriers to participate in program There are no identifiable barriers or psychosocial needs.      Screening Interventions   Interventions Provide feedback about the scores to participant;To provide support and resources with identified psychosocial needs;Encouraged to exercise    Expected Outcomes Short Term goal: Utilizing psychosocial counselor, staff and physician to assist with identification of specific Stressors or current issues interfering with healing process. Setting desired goal for each stressor or current issue identified.;Long Term Goal: Stressors or current issues are controlled or eliminated.;Short Term goal: Identification and review with participant of any Quality of Life or Depression concerns found by scoring the questionnaire.;Long Term goal: The participant improves quality of Life and PHQ9 Scores as seen by post scores and/or verbalization of changes             Quality of Life Scores:  Scores of 19 and below usually indicate a poorer quality of life in these areas.  A difference of  2-3 points is a clinically meaningful difference.  A difference of 2-3 points in the total score of the Quality of Life Index has been associated with significant improvement in overall quality of life, self-image, physical symptoms, and general health in studies assessing change in quality of life.  PHQ-9: Review Flowsheet  More data exists      06/10/2023 01/30/2023 09/02/2022 06/17/2022 03/17/2022  Depression screen PHQ 2/9  Decreased Interest 0 1 1 1 1   Down, Depressed, Hopeless 1 0 0 0 0  PHQ - 2 Score 1 1 1 1 1   Altered sleeping 2 - - 2 3  Tired, decreased energy 2 - - 1 2  Change in appetite 0 - - 0 1  Feeling bad or failure about yourself  0 - - 0 0  Trouble concentrating 1 - - 0 1  Moving slowly or fidgety/restless 1 - - 1 1  Suicidal thoughts 0 - - 0 0  PHQ-9 Score 7 - - 5 9  Difficult doing work/chores Somewhat difficult - - -  -   Interpretation of Total Score  Total Score Depression Severity:  1-4 = Minimal depression, 5-9 = Mild depression, 10-14 = Moderate depression, 15-19 = Moderately severe depression, 20-27 = Severe depression   Psychosocial Evaluation and Intervention:  Psychosocial Evaluation - 05/26/23 1409       Psychosocial Evaluation & Interventions   Interventions Encouraged to exercise with the program and follow exercise prescription    Comments Mr. Swoboda is coming to pulmonary rehab with COPD. During the initial phone call, she was under  a time constraint and was not able to go into detail. She enjoys doing aquatic exercises when she can and is currently doing physical therapy for her shoulder. She wants to attend the program to work on her breathing and stamina.    Expected Outcomes Short: attend pulmonary rehab for education and exercise Long: develop and maintain positive self care habits.    Continue Psychosocial Services  Follow up required by staff             Psychosocial Re-Evaluation:   Psychosocial Discharge (Final Psychosocial Re-Evaluation):   Education: Education Goals: Education classes will be provided on a weekly basis, covering required topics. Participant will state understanding/return demonstration of topics presented.  Learning Barriers/Preferences:  Learning Barriers/Preferences - 05/26/23 1408       Learning Barriers/Preferences   Learning Barriers None    Learning Preferences Individual Instruction             General Pulmonary Education Topics:  Infection Prevention: - Provides verbal and written material to individual with discussion of infection control including proper hand washing and proper equipment cleaning during exercise session. Flowsheet Row Pulmonary Rehab from 06/10/2023 in [facility] Cardiac and Pulmonary Rehab  Date 06/10/23  Educator MB  Instruction Review Code 1- Verbalizes Understanding       Falls Prevention: - Provides verbal and  written material to individual with discussion of falls prevention and safety. Flowsheet Row Pulmonary Rehab from 06/10/2023 in [facility] Cardiac and Pulmonary Rehab  Date 06/10/23  Educator MB  Instruction Review Code 1- Verbalizes Understanding       Chronic Lung Disease Review: - Group verbal instruction with posters, models, PowerPoint presentations and videos,  to review new updates, new respiratory medications, new advancements in procedures and treatments. Providing information on websites and "800" numbers for continued self-education. Includes information about supplement oxygen, available portable oxygen systems, continuous and intermittent flow rates, oxygen safety, concentrators, and Medicare reimbursement for oxygen. Explanation of Pulmonary Drugs, including class, frequency, complications, importance of spacers, rinsing mouth after steroid MDI's, and proper cleaning methods for nebulizers. Review of basic lung anatomy and physiology related to function, structure, and complications of lung disease. Review of risk factors. Discussion about methods for diagnosing sleep apnea and types of masks and machines for OSA. Includes a review of the use of types of environmental controls: home humidity, furnaces, filters, dust mite/pet prevention, HEPA vacuums. Discussion about weather changes, air quality and the benefits of nasal washing. Instruction on Warning signs, infection symptoms, calling MD promptly, preventive modes, and value of vaccinations. Review of effective airway clearance, coughing and/or vibration techniques. Emphasizing that all should Create an Action Plan. Written material given at graduation.   AED/CPR: - Group verbal and written instruction with the use of models to demonstrate the basic use of the AED with the basic ABC's of resuscitation.    Anatomy and Cardiac Procedures: - Group verbal and visual presentation and models provide information about basic cardiac anatomy and  function. Reviews the testing methods done to diagnose heart disease and the outcomes of the test results. Describes the treatment choices: Medical Management, Angioplasty, or Coronary Bypass Surgery for treating various heart conditions including Myocardial Infarction, Angina, Valve Disease, and Cardiac Arrhythmias.  Written material given at graduation.   Medication Safety: - Group verbal and visual instruction to review commonly prescribed medications for heart and lung disease. Reviews the medication, class of the drug, and side effects. Includes the steps to properly store meds and maintain the prescription regimen.  Written material given at graduation.   Other: -Provides group and verbal instruction on various topics (see comments)   Knowledge Questionnaire Score:    Core Components/Risk Factors/Patient Goals at Admission:  Personal Goals and Risk Factors at Admission - 06/10/23 1117       Core Components/Risk Factors/Patient Goals on Admission    Weight Management Yes;Weight Maintenance    Intervention Weight Management: Develop a combined nutrition and exercise program designed to reach desired caloric intake, while maintaining appropriate intake of nutrient and fiber, sodium and fats, and appropriate energy expenditure required for the weight goal.;Weight Management: Provide education and appropriate resources to help participant work on and attain dietary goals.;Weight Management/Obesity: Establish reasonable short term and long term weight goals.    Admit Weight 115 lb (52.2 kg)    Goal Weight: Short Term 115 lb (52.2 kg)    Goal Weight: Long Term 115 lb (52.2 kg)    Expected Outcomes Short Term: Continue to assess and modify interventions until short term weight is achieved;Long Term: Adherence to nutrition and physical activity/exercise program aimed toward attainment of established weight goal;Weight Maintenance: Understanding of the daily nutrition guidelines, which includes  25-35% calories from fat, 7% or less cal from saturated fats, less than 200mg  cholesterol, less than 1.5gm of sodium, & 5 or more servings of fruits and vegetables daily;Understanding recommendations for meals to include 15-35% energy as protein, 25-35% energy from fat, 35-60% energy from carbohydrates, less than 200mg  of dietary cholesterol, 20-35 gm of total fiber daily;Understanding of distribution of calorie intake throughout the day with the consumption of 4-5 meals/snacks    Improve shortness of breath with ADL's Yes    Intervention Provide education, individualized exercise plan and daily activity instruction to help decrease symptoms of SOB with activities of daily living.    Expected Outcomes Short Term: Improve cardiorespiratory fitness to achieve a reduction of symptoms when performing ADLs;Long Term: Be able to perform more ADLs without symptoms or delay the onset of symptoms             Education:Diabetes - Individual verbal and written instruction to review signs/symptoms of diabetes, desired ranges of glucose level fasting, after meals and with exercise. Acknowledge that pre and post exercise glucose checks will be done for 3 sessions at entry of program.   Know Your Numbers and Heart Failure: - Group verbal and visual instruction to discuss disease risk factors for cardiac and pulmonary disease and treatment options.  Reviews associated critical values for Overweight/Obesity, Hypertension, Cholesterol, and Diabetes.  Discusses basics of heart failure: signs/symptoms and treatments.  Introduces Heart Failure Zone chart for action plan for heart failure.  Written material given at graduation.   Core Components/Risk Factors/Patient Goals Review:    Core Components/Risk Factors/Patient Goals at Discharge (Final Review):    ITP Comments:  ITP Comments     Row Name 05/26/23 1414 06/10/23 1102         ITP Comments Initial phone call completed. Diagnosis can be found in [facility]  1/22. EP Orientation scheduled for Thursday 3/27 at 2pm. Completed and gym orientation. Initial ITP created and sent for review to Dr. Jinny Sanders, Medical Director.               Comments: Initial ITP

## 2023-06-10 NOTE — Telephone Encounter (Signed)
 Left message on voicemail that Rx ws sent to pharmacy.

## 2023-06-10 NOTE — Patient Instructions (Signed)
 Patient Instructions  Patient Details  Name: [name] MRN: [id number] Date of Birth: Apr 18, 1960 Referring Provider:  Janann Colonel, MD  Below are your personal goals for exercise, nutrition, and risk factors. Our goal is to help you stay on track towards obtaining and maintaining these goals. We will be discussing your progress on these goals with you throughout the program.  Initial Exercise Prescription:  Initial Exercise Prescription - 06/10/23 1100       Date of Initial Exercise RX and Referring Provider   Date 06/10/23    Referring Provider Assaker, [place], MD      Oxygen   Maintain Oxygen Saturation 88% or higher      Treadmill   MPH 2.4   Try   Grade 0    Minutes 15    METs 2.84      REL-XR   Level 3    Speed 50    Minutes 15    METs 3.91      T5 Nustep   Level 3    SPM 80    Minutes 15    METs 3.91      Track   Laps 34    Minutes 15    METs 2.85      Prescription Details   Frequency (times per week) 2    Duration Progress to 30 minutes of continuous aerobic without signs/symptoms of physical distress      Intensity   THRR 40-80% of Max Heartrate 114-142    Ratings of Perceived Exertion 11-13    Perceived Dyspnea 0-4      Progression   Progression Continue to progress workloads to maintain intensity without signs/symptoms of physical distress.      Resistance Training   Training Prescription Yes    Weight 3 lb    Reps 10-15             Exercise Goals: Frequency: Be able to perform aerobic exercise two to three times per week in program working toward 2-5 days per week of home exercise.  Intensity: Work with a perceived exertion of 11 (fairly light) - 15 (hard) while following your exercise prescription.  We will make changes to your prescription with you as you progress through the program.   Duration: Be able to do 30 to 45 minutes of continuous aerobic exercise in addition to a 5 minute warm-up and a 5 minute cool-down  routine.   Nutrition Goals: Your personal nutrition goals will be established when you do your nutrition analysis with the dietician.  The following are general nutrition guidelines to follow: Cholesterol < 200mg /day Sodium < 1500mg /day Fiber: Women over 50 yrs - 21 grams per day  Personal Goals:  Personal Goals and Risk Factors at Admission - 06/10/23 1117       Core Components/Risk Factors/Patient Goals on Admission    Weight Management Yes;Weight Maintenance    Intervention Weight Management: Develop a combined nutrition and exercise program designed to reach desired caloric intake, while maintaining appropriate intake of nutrient and fiber, sodium and fats, and appropriate energy expenditure required for the weight goal.;Weight Management: Provide education and appropriate resources to help participant work on and attain dietary goals.;Weight Management/Obesity: Establish reasonable short term and long term weight goals.    Admit Weight 115 lb (52.2 kg)    Goal Weight: Short Term 115 lb (52.2 kg)    Goal Weight: Long Term 115 lb (52.2 kg)    Expected Outcomes Short Term: Continue to assess and  modify interventions until short term weight is achieved;Long Term: Adherence to nutrition and physical activity/exercise program aimed toward attainment of established weight goal;Weight Maintenance: Understanding of the daily nutrition guidelines, which includes 25-35% calories from fat, 7% or less cal from saturated fats, less than 200mg  cholesterol, less than 1.5gm of sodium, & 5 or more servings of fruits and vegetables daily;Understanding recommendations for meals to include 15-35% energy as protein, 25-35% energy from fat, 35-60% energy from carbohydrates, less than 200mg  of dietary cholesterol, 20-35 gm of total fiber daily;Understanding of distribution of calorie intake throughout the day with the consumption of 4-5 meals/snacks    Improve shortness of breath with ADL's Yes    Intervention  Provide education, individualized exercise plan and daily activity instruction to help decrease symptoms of SOB with activities of daily living.    Expected Outcomes Short Term: Improve cardiorespiratory fitness to achieve a reduction of symptoms when performing ADLs;Long Term: Be able to perform more ADLs without symptoms or delay the onset of symptoms             Tobacco Use Initial Evaluation: Social History   Tobacco Use  Smoking Status Former   Current packs/day: 0.00   Average packs/day: 1 pack/day for 40.0 years (40.0 ttl pk-yrs)   Types: Cigarettes   Start date: 03/10/1981   Quit date: 03/10/2021   Years since quitting: 2.2   Passive exposure: Past  Smokeless Tobacco Never  Tobacco Comments   on nicotine patches    Exercise Goals and Review:  Exercise Goals     Row Name 06/10/23 1116             Exercise Goals   Increase Physical Activity Yes       Intervention Provide advice, education, support and counseling about physical activity/exercise needs.;Develop an individualized exercise prescription for aerobic and resistive training based on initial evaluation findings, risk stratification, comorbidities and participant's personal goals.       Expected Outcomes Short Term: Attend rehab on a regular basis to increase amount of physical activity.;Long Term: Add in home exercise to make exercise part of routine and to increase amount of physical activity.;Long Term: Exercising regularly at least 3-5 days a week.       Increase Strength and Stamina Yes       Intervention Provide advice, education, support and counseling about physical activity/exercise needs.;Develop an individualized exercise prescription for aerobic and resistive training based on initial evaluation findings, risk stratification, comorbidities and participant's personal goals.       Expected Outcomes Short Term: Increase workloads from initial exercise prescription for resistance, speed, and METs.;Short Term:  Perform resistance training exercises routinely during rehab and add in resistance training at home;Long Term: Improve cardiorespiratory fitness, muscular endurance and strength as measured by increased METs and functional capacity ( )       Able to understand and use rate of perceived exertion (RPE) scale Yes       Intervention Provide education and explanation on how to use RPE scale       Expected Outcomes Short Term: Able to use RPE daily in rehab to express subjective intensity level;Long Term:  Able to use RPE to guide intensity level when exercising independently       Able to understand and use Dyspnea scale Yes       Intervention Provide education and explanation on how to use Dyspnea scale       Expected Outcomes Short Term: Able to use Dyspnea scale daily in  rehab to express subjective sense of shortness of breath during exertion;Long Term: Able to use Dyspnea scale to guide intensity level when exercising independently       Knowledge and understanding of Target Heart Rate Range (THRR) Yes       Intervention Provide education and explanation of THRR including how the numbers were predicted and where they are located for reference       Expected Outcomes Short Term: Able to state/look up THRR;Short Term: Able to use daily as guideline for intensity in rehab;Long Term: Able to use THRR to govern intensity when exercising independently       Able to check pulse independently Yes       Intervention Provide education and demonstration on how to check pulse in carotid and radial arteries.;Review the importance of being able to check your own pulse for safety during independent exercise       Expected Outcomes Short Term: Able to explain why pulse checking is important during independent exercise;Long Term: Able to check pulse independently and accurately       Understanding of Exercise Prescription Yes       Intervention Provide education, explanation, and written materials on patient's  individual exercise prescription       Expected Outcomes Short Term: Able to explain program exercise prescription;Long Term: Able to explain home exercise prescription to exercise independently

## 2023-06-11 ENCOUNTER — Encounter

## 2023-06-11 ENCOUNTER — Other Ambulatory Visit (HOSPITAL_COMMUNITY): Payer: Self-pay

## 2023-06-11 NOTE — Telephone Encounter (Signed)
 Called & LVM informing that prescriptions have been sent to the pharmacy.

## 2023-06-12 ENCOUNTER — Other Ambulatory Visit: Payer: Self-pay

## 2023-06-12 ENCOUNTER — Other Ambulatory Visit (HOSPITAL_COMMUNITY): Payer: Self-pay

## 2023-06-12 NOTE — Telephone Encounter (Signed)
 Patient called back to check on this inquiry. Medication showing in chart but is not signed and pharmacy has not received it. OTC cough syrup not working. Thank you

## 2023-06-12 NOTE — Progress Notes (Signed)
 Specialty Pharmacy Refill Coordination Note  [name] is a 63 y.o. female contacted today regarding refills of specialty medication(s) Etanercept (Enbrel Mini)   Patient requested Delivery   Delivery date: 06/26/23   Verified address: [address] [place] [address]   Medication will be filled on 06/25/23.

## 2023-06-12 NOTE — Telephone Encounter (Unsigned)
 Copied from CRM [id number]. Topic: Clinical - Prescription Issue >> Jun 12, 2023 12:58 PM Nila Nephew wrote: Reason for CRM: Patient is calling to state that Robutussin is not working. Patient is requesting a prescription be signed at the pharmacy. She states a prescription for benzonatate 100mg  is awaiting at her pharmacy but has not been signed off on. Unclear who prescribed but patient is requesting we sign or send.

## 2023-06-15 ENCOUNTER — Other Ambulatory Visit (HOSPITAL_COMMUNITY): Payer: Self-pay

## 2023-06-15 NOTE — Telephone Encounter (Signed)
 Copied from CRM [id number]. Topic: Clinical - Prescription Issue >> Jun 15, 2023  2:27 PM Fredrica W wrote: Reason for CRM: Patient called back to check on previous inquiry. Has called multiple times. Spoke with Pharmacy today and they still have not received anything. Medication showing in chart but pharmacy has not received it. OTC cough syrup not working. Patient still has cough. Thank you

## 2023-06-16 ENCOUNTER — Encounter: Admitting: *Deleted

## 2023-06-16 ENCOUNTER — Encounter

## 2023-06-16 DIAGNOSIS — J449 Chronic obstructive pulmonary disease, unspecified: Secondary | ICD-10-CM

## 2023-06-16 NOTE — Progress Notes (Signed)
 Daily Session Note  Patient Details  Name: [name] MRN: [id number] Date of Birth: [birth year] Referring Provider:   Flowsheet Row Pulmonary Rehab from 06/10/2023 in [facility]  Referring Provider Janann Colonel, MD       Encounter Date: 06/16/2023  Check In:  Session Check In - 06/16/23 0923       Check-In   Supervising physician immediately available to respond to emergencies See telemetry face sheet for immediately available ER MD    Location [facility]-Cardiac & Pulmonary Rehab    Staff Present Ronette Deter, BS, Exercise Physiologist;Maxon Conetta BS, Exercise Physiologist;Vitali Seibert, RN, BSN, CCRP;Margaret Best, MS, Exercise Physiologist    Virtual Visit No    Medication changes reported     No    Fall or balance concerns reported    No    Warm-up and Cool-down Performed on first and last piece of equipment    Resistance Training Performed Yes    VAD Patient? No    PAD/SET Patient? No      Pain Assessment   Currently in Pain? No/denies                Social History   Tobacco Use  Smoking Status Former   Current packs/day: 0.00   Average packs/day: 1 pack/day for 40.0 years (40.0 ttl pk-yrs)   Types: Cigarettes   Start date: 03/10/1981   Quit date: 03/10/2021   Years since quitting: 2.2   Passive exposure: Past  Smokeless Tobacco Never  Tobacco Comments   on nicotine patches    Goals Met:  Proper associated with RPD/PD & O2 Sat Exercise tolerated well Personal goals reviewed No report of concerns or symptoms today  Goals Unmet:  Not Applicable  Comments: Pt able to follow exercise prescription today without complaint.  Will continue to monitor for progression. First full day of exercise!  Patient was oriented to gym and equipment including functions, settings, policies, and procedures.  Patient's individual exercise prescription and treatment plan were reviewed.  All starting workloads were established based on the results of  the 6 minute walk test done at initial orientation visit.  The plan for exercise progression was also introduced and progression will be customized based on patient's performance and goals.    Dr. Bethann Punches is Medical Director for [facility] Card[facility].  Dr. Vida Rigger is Medical Director for [facility].

## 2023-06-16 NOTE — Progress Notes (Signed)
 Assessment start time: 10:20 AM  Digestive issues/concerns: no known food allergies   24-hours Recall: B: eggs, banana, protein drink L: salmon or tuna D: chicken, dark greens  Beverages water (64oz), juice  Education r/t nutrition plan Patient drinking mostly water, sometimes a carbonated water or ginger ale to settle her stomach. She eat 3 times per day, sometimes a snack or two. She knows she needs protein and tries to include one at each meals and sometimes her snacks have one too. She has been drinking a [employer] protein drinks (340kcal, 6gFat, 45gCHO, 38gSugar, 30gPro). We talked about the label, educated on how to read labels while also recommending a low sugar protein shake to try as well. She likes the sweetness of the [employer] shake. Overall, it will be fine, just try not to drink it with high carb meals or snacks. Reviewed mediterranean diet handout. Provided balanced plates sample handout, discussed calorie density and ways to help increase calories without increasing quantity. Explained her increased protein and calorie needs. She understands and will continue to prioritize protein and calories while choosing whole foods and eating smaller more frequent meals throughout the day.   Goal 1: Eat 15-30gProtein and 30-60gCarbs at each meal. Goal 2: Reduce saturated fat, less than 12g per day. Replace bad fats for more heart healthy fats.  End time 11:04 AM

## 2023-06-17 ENCOUNTER — Encounter: Payer: Self-pay | Admitting: *Deleted

## 2023-06-17 DIAGNOSIS — J449 Chronic obstructive pulmonary disease, unspecified: Secondary | ICD-10-CM

## 2023-06-17 NOTE — Progress Notes (Signed)
 Pulmonary Individual Treatment Plan  Patient Details  Name: [name] MRN: [id number] Date of Birth: 1960/05/30 Referring Provider:   Flowsheet Row Pulmonary Rehab from 06/10/2023 in [facility] Cardiac and Pulmonary Rehab  Referring Provider Assaker, [place], MD       Initial Encounter Date:  Flowsheet Row Pulmonary Rehab from 06/10/2023 in [facility] Cardiac and Pulmonary Rehab  Date 06/10/23       Visit Diagnosis: Chronic obstructive pulmonary disease, unspecified COPD type (HCC)  Patient's Home Medications on Admission:  Current Outpatient Medications:    albuterol (VENTOLIN HFA) 108 (90 Base) MCG/ACT inhaler, TAKE 2 PUFFS BY MOUTH EVERY 6 HOURS AS NEEDED FOR WHEEZE OR SHORTNESS OF BREATH, Disp: 17 each, Rfl: 1   benzonatate (TESSALON) 100 MG capsule, Take 1 capsule (100 mg total) by mouth 2 (two) times daily as needed for cough., Disp: 20 capsule, Rfl: 0   budeson-glycopyrrolate-formoterol (BREZTRI AEROSPHERE) 160-9-4.8 MCG/ACT AERO, Inhale 2 puffs into the lungs 2 (two) times daily., Disp: 10.7 g, Rfl: 11   Calcium Carb-Cholecalciferol (CALCIUM 1000 + D PO), Take by mouth., Disp: , Rfl:    COLLAGEN PO, Take by mouth daily., Disp: , Rfl:    cyanocobalamin 100 MCG tablet, Take 100 mcg by mouth daily., Disp: , Rfl:    etanercept (ENBREL MINI) 50 MG/ML injection, INJECT 50 MG INTO THE SKIN ONCE A WEEK., Disp: 4 mL, Rfl: 2   folic acid (FOLVITE) 1 MG tablet, Take 2 tablets (2 mg total) by mouth daily., Disp: 180 tablet, Rfl: 3   hydrOXYzine (VISTARIL) 25 MG capsule, Take 1 capsule (25 mg total) by mouth at bedtime., Disp: 30 capsule, Rfl: 3   methotrexate (RHEUMATREX) 2.5 MG tablet, Take 8 tablets (20 mg total) by mouth once a week. Caution:Chemotherapy. Protect from light., Disp: 32 tablet, Rfl: 2   Methotrexate, PF, (OTREXUP) 20 MG/0.4ML SOAJ, Inject 20 mg into the skin once a week., Disp: 1.6 mL, Rfl: 2   Multiple Vitamin (MULTIVITAMIN WITH MINERALS) TABS tablet, Take 1 tablet by mouth  daily., Disp: , Rfl:    nicotine (NICODERM CQ - DOSED IN MG/24 HR) 7 mg/24hr patch, Place 1 patch (7 mg total) onto the skin daily. Please schedule appt with PCP for additional refills., Disp: 28 patch, Rfl: 0   tamoxifen (NOLVADEX) 20 MG tablet, Take 1 tablet (20 mg total) by mouth daily., Disp: 90 tablet, Rfl: 1   VITAMIN D PO, Take by mouth daily., Disp: , Rfl:   Past Medical History: Past Medical History:  Diagnosis Date   Anemia    during pregnancy   Anxiety    Asthma    Bronchitis age 19   Cancer (HCC)    left breast ca   COPD (chronic obstructive pulmonary disease) (HCC)    Depression    Hx of bladder infections    been over 65 years since last one    Malignant neoplasm of upper-outer quadrant of left breast in female, estrogen receptor positive (HCC)    Rheumatoid arthritis (HCC)    unc rheum    Tobacco abuse    Trouble in sleeping     Tobacco Use: Social History   Tobacco Use  Smoking Status Former   Current packs/day: 0.00   Average packs/day: 1 pack/day for 40.0 years (40.0 ttl pk-yrs)   Types: Cigarettes   Start date: 03/10/1981   Quit date: 03/10/2021   Years since quitting: 2.2   Passive exposure: Past  Smokeless Tobacco Never  Tobacco Comments   on  nicotine patches    Labs: Review Flowsheet       Latest Ref Rng & Units 01/15/2018 07/30/2021  Labs for ITP Cardiac and Pulmonary Rehab  Cholestrol 100 - 199 mg/dL 244  010   LDL (calc) 0 - 99 mg/dL 67  84   HDL-C >27 mg/dL 77  71   Trlycerides 0 - 149 mg/dL 253  664      Pulmonary Assessment Scores:  Pulmonary Assessment Scores     Row Name 06/10/23 1117         mMRC Score   mMRC Score 3              UCSD: Self-administered rating of dyspnea associated with activities of daily living (ADLs) 6-point scale (0 = "not at all" to 5 = "maximal or unable to do because of breathlessness")  Scoring Scores range from 0 to 120.  Minimally important difference is 5 units  CAT: CAT can identify the  health impairment of COPD patients and is better correlated with disease progression.  CAT has a scoring range of zero to 40. The CAT score is classified into four groups of low (less than 10), medium (10 - 20), high (21-30) and very high (31-40) based on the impact level of disease on health status. A CAT score over 10 suggests significant symptoms.  A worsening CAT score could be explained by an exacerbation, poor medication adherence, poor inhaler technique, or progression of COPD or comorbid conditions.  CAT MCID is 2 points  mMRC: mMRC (Modified Medical Research Council) Dyspnea Scale is used to assess the degree of baseline functional disability in patients of respiratory disease due to dyspnea. No minimal important difference is established. A decrease in score of 1 point or greater is considered a positive change.   Pulmonary Function Assessment:   Exercise Target Goals: Exercise Program Goal: Individual exercise prescription set using results from initial 6 min walk test and THRR while considering  patient's activity barriers and safety.   Exercise Prescription Goal: Initial exercise prescription builds to 30-45 minutes a day of aerobic activity, 2-3 days per week.  Home exercise guidelines will be given to patient during program as part of exercise prescription that the participant will acknowledge.  Education: Aerobic Exercise: - Group verbal and visual presentation on the components of exercise prescription. Introduces F.I.T.T principle from ACSM for exercise prescriptions.  Reviews F.I.T.T. principles of aerobic exercise including progression. Written material given at graduation.   Education: Resistance Exercise: - Group verbal and visual presentation on the components of exercise prescription. Introduces F.I.T.T principle from ACSM for exercise prescriptions  Reviews F.I.T.T. principles of resistance exercise including progression. Written material given at graduation.     Education: Exercise & Equipment Safety: - Individual verbal instruction and demonstration of equipment use and safety with use of the equipment. Flowsheet Row Pulmonary Rehab from 06/10/2023 in [facility] Cardiac and Pulmonary Rehab  Date 06/10/23  Educator MB  Instruction Review Code 1- Verbalizes Understanding       Education: Exercise Physiology & General Exercise Guidelines: - Group verbal and written instruction with models to review the exercise physiology of the cardiovascular system and associated critical values. Provides general exercise guidelines with specific guidelines to those with heart or lung disease.    Education: Flexibility, Balance, Mind/Body Relaxation: - Group verbal and visual presentation with interactive activity on the components of exercise prescription. Introduces F.I.T.T principle from ACSM for exercise prescriptions. Reviews F.I.T.T. principles of flexibility and balance exercise training including  progression. Also discusses the mind body connection.  Reviews various relaxation techniques to help reduce and manage stress (i.e. Deep breathing, progressive muscle relaxation, and visualization). Balance handout provided to take home. Written material given at graduation.   Activity Barriers & Risk Stratification:  Activity Barriers & Cardiac Risk Stratification - 06/10/23 1108       Activity Barriers & Cardiac Risk Stratification   Activity Barriers Arthritis;Joint Problems   R shoulder, and RA            6 Minute Walk:  6 Minute Walk     Row Name 06/10/23 1103         6 Minute Walk   Phase Initial     Distance 1305 feet     Walk Time 6 minutes     # of Rest Breaks 0     MPH 2.47     METS 3.91     RPE 9     Perceived Dyspnea  3     VO2 Peak 13.69     Symptoms No     Resting HR 86 bpm     Resting BP 104/70     Resting Oxygen Saturation  92 %     Exercise Oxygen Saturation  during 6 min walk 87 %     Max Ex. HR 121 bpm     Max Ex. BP 132/62      2 Minute Post BP 102/64       Interval HR   1 Minute HR 103     2 Minute HR 114     3 Minute HR 116     4 Minute HR 114     5 Minute HR 117     6 Minute HR 121     2 Minute Post HR 107     Interval Heart Rate? Yes       Interval Oxygen   Interval Oxygen? Yes     Baseline Oxygen Saturation % 92 %     1 Minute Oxygen Saturation % 91 %     1 Minute Liters of Oxygen 0 L     2 Minute Oxygen Saturation % 90 %     2 Minute Liters of Oxygen 0 L     3 Minute Oxygen Saturation % 88 %     3 Minute Liters of Oxygen 0 L     4 Minute Oxygen Saturation % 88 %     4 Minute Liters of Oxygen 0 L     5 Minute Oxygen Saturation % 87 %     5 Minute Liters of Oxygen 0 L     6 Minute Oxygen Saturation % 88 %     6 Minute Liters of Oxygen 0 L     2 Minute Post Oxygen Saturation % 94 %     2 Minute Post Liters of Oxygen 0 L             Oxygen Initial Assessment:  Oxygen Initial Assessment - 05/26/23 1405       Home Oxygen   Home Oxygen Device None    Sleep Oxygen Prescription None    Home Exercise Oxygen Prescription None    Home Resting Oxygen Prescription None    Compliance with Home Oxygen Use Yes      Intervention   Short Term Goals To learn and understand importance of monitoring SPO2 with pulse oximeter and demonstrate accurate use of the pulse oximeter.;To learn and understand importance  of maintaining oxygen saturations>88%;To learn and demonstrate proper pursed lip breathing techniques or other breathing techniques. ;To learn and demonstrate proper use of respiratory medications    Long  Term Goals Maintenance of O2 saturations>88%;Verbalizes importance of monitoring SPO2 with pulse oximeter and return demonstration;Exhibits proper breathing techniques, such as pursed lip breathing or other method taught during program session;Compliance with respiratory medication;Demonstrates proper use of MDI's             Oxygen Re-Evaluation:  Oxygen Re-Evaluation     Row Name  06/16/23 0925             Program Oxygen Prescription   Program Oxygen Prescription None         Home Oxygen   Home Oxygen Device None       Home Exercise Oxygen Prescription None       Home Resting Oxygen Prescription None       Compliance with Home Oxygen Use Yes         Goals/Expected Outcomes   Short Term Goals To learn and demonstrate proper pursed lip breathing techniques or other breathing techniques.        Long  Term Goals Exhibits proper breathing techniques, such as pursed lip breathing or other method taught during program session       Comments Reviewed PLB technique with pt.  Talked about how it works and it's importance in maintaining their exercise saturations.       Goals/Expected Outcomes Short: Become more profiecient at using PLB. Long: Become independent at using PLB.                Oxygen Discharge (Final Oxygen Re-Evaluation):  Oxygen Re-Evaluation - 06/16/23 0925       Program Oxygen Prescription   Program Oxygen Prescription None      Home Oxygen   Home Oxygen Device None    Home Exercise Oxygen Prescription None    Home Resting Oxygen Prescription None    Compliance with Home Oxygen Use Yes      Goals/Expected Outcomes   Short Term Goals To learn and demonstrate proper pursed lip breathing techniques or other breathing techniques.     Long  Term Goals Exhibits proper breathing techniques, such as pursed lip breathing or other method taught during program session    Comments Reviewed PLB technique with pt.  Talked about how it works and it's importance in maintaining their exercise saturations.    Goals/Expected Outcomes Short: Become more profiecient at using PLB. Long: Become independent at using PLB.             Initial Exercise Prescription:  Initial Exercise Prescription - 06/10/23 1100       Date of Initial Exercise RX and Referring Provider   Date 06/10/23    Referring Provider Assaker, [place], MD      Oxygen   Maintain  Oxygen Saturation 88% or higher      Treadmill   MPH 2.4   Try   Grade 0    Minutes 15    METs 2.84      REL-XR   Level 3    Speed 50    Minutes 15    METs 3.91      T5 Nustep   Level 3    SPM 80    Minutes 15    METs 3.91      Track   Laps 34    Minutes 15    METs 2.85  Prescription Details   Frequency (times per week) 2    Duration Progress to 30 minutes of continuous aerobic without signs/symptoms of physical distress      Intensity   THRR 40-80% of Max Heartrate 114-142    Ratings of Perceived Exertion 11-13    Perceived Dyspnea 0-4      Progression   Progression Continue to progress workloads to maintain intensity without signs/symptoms of physical distress.      Resistance Training   Training Prescription Yes    Weight 3 lb    Reps 10-15             Perform Capillary Blood Glucose checks as needed.  Exercise Prescription Changes:   Exercise Prescription Changes     Row Name 06/10/23 1100             Response to Exercise   Blood Pressure (Admit) 104/70       Blood Pressure (Exercise) 132/62       Blood Pressure (Exit) 102/64       Heart Rate (Admit) 86 bpm       Heart Rate (Exercise) 121 bpm       Heart Rate (Exit) 107 bpm       Oxygen Saturation (Admit) 92 %       Oxygen Saturation (Exercise) 87 %       Oxygen Saturation (Exit) 94 %       Rating of Perceived Exertion (Exercise) 9       Perceived Dyspnea (Exercise) 3       Symptoms none       Comments results       Duration Progress to 30 minutes of  aerobic without signs/symptoms of physical distress       Intensity THRR New         Progression   Progression Continue to progress workloads to maintain intensity without signs/symptoms of physical distress.       Average METs 3.91                Exercise Comments:   Exercise Goals and Review:   Exercise Goals     Row Name 06/10/23 1116             Exercise Goals   Increase Physical Activity Yes        Intervention Provide advice, education, support and counseling about physical activity/exercise needs.;Develop an individualized exercise prescription for aerobic and resistive training based on initial evaluation findings, risk stratification, comorbidities and participant's personal goals.       Expected Outcomes Short Term: Attend rehab on a regular basis to increase amount of physical activity.;Long Term: Add in home exercise to make exercise part of routine and to increase amount of physical activity.;Long Term: Exercising regularly at least 3-5 days a week.       Increase Strength and Stamina Yes       Intervention Provide advice, education, support and counseling about physical activity/exercise needs.;Develop an individualized exercise prescription for aerobic and resistive training based on initial evaluation findings, risk stratification, comorbidities and participant's personal goals.       Expected Outcomes Short Term: Increase workloads from initial exercise prescription for resistance, speed, and METs.;Short Term: Perform resistance training exercises routinely during rehab and add in resistance training at home;Long Term: Improve cardiorespiratory fitness, muscular endurance and strength as measured by increased METs and functional capacity ( )       Able to understand and use rate of perceived exertion (  RPE) scale Yes       Intervention Provide education and explanation on how to use RPE scale       Expected Outcomes Short Term: Able to use RPE daily in rehab to express subjective intensity level;Long Term:  Able to use RPE to guide intensity level when exercising independently       Able to understand and use Dyspnea scale Yes       Intervention Provide education and explanation on how to use Dyspnea scale       Expected Outcomes Short Term: Able to use Dyspnea scale daily in rehab to express subjective sense of shortness of breath during exertion;Long Term: Able to use Dyspnea scale to  guide intensity level when exercising independently       Knowledge and understanding of Target Heart Rate Range (THRR) Yes       Intervention Provide education and explanation of THRR including how the numbers were predicted and where they are located for reference       Expected Outcomes Short Term: Able to state/look up THRR;Short Term: Able to use daily as guideline for intensity in rehab;Long Term: Able to use THRR to govern intensity when exercising independently       Able to check pulse independently Yes       Intervention Provide education and demonstration on how to check pulse in carotid and radial arteries.;Review the importance of being able to check your own pulse for safety during independent exercise       Expected Outcomes Short Term: Able to explain why pulse checking is important during independent exercise;Long Term: Able to check pulse independently and accurately       Understanding of Exercise Prescription Yes       Intervention Provide education, explanation, and written materials on patient's individual exercise prescription       Expected Outcomes Short Term: Able to explain program exercise prescription;Long Term: Able to explain home exercise prescription to exercise independently                Exercise Goals Re-Evaluation :  Exercise Goals Re-Evaluation     Row Name 06/16/23 [phone]             Exercise Goal Re-Evaluation   Exercise Goals Review Able to understand and use rate of perceived exertion (RPE) scale;Able to understand and use Dyspnea scale;Knowledge and understanding of Target Heart Rate Range (THRR);Understanding of Exercise Prescription       Comments Reviewed RPE and dyspnea scale, THR and program prescription with pt today.  Pt voiced understanding and was given a copy of goals to take home.       Expected Outcomes Short: Use RPE daily to regulate intensity. Long: Follow program prescription in THR.                Discharge Exercise  Prescription (Final Exercise Prescription Changes):  Exercise Prescription Changes - 06/10/23 1100       Response to Exercise   Blood Pressure (Admit) 104/70    Blood Pressure (Exercise) 132/62    Blood Pressure (Exit) 102/64    Heart Rate (Admit) 86 bpm    Heart Rate (Exercise) 121 bpm    Heart Rate (Exit) 107 bpm    Oxygen Saturation (Admit) 92 %    Oxygen Saturation (Exercise) 87 %    Oxygen Saturation (Exit) 94 %    Rating of Perceived Exertion (Exercise) 9    Perceived Dyspnea (Exercise) 3    Symptoms none  Comments results    Duration Progress to 30 minutes of  aerobic without signs/symptoms of physical distress    Intensity THRR New      Progression   Progression Continue to progress workloads to maintain intensity without signs/symptoms of physical distress.    Average METs 3.91             Nutrition:  Target Goals: Understanding of nutrition guidelines, daily intake of sodium 1500mg , cholesterol 200mg , calories 30% from fat and 7% or less from saturated fats, daily to have 5 or more servings of fruits and vegetables.  Education: All About Nutrition: -Group instruction provided by verbal, written material, interactive activities, discussions, models, and posters to present general guidelines for heart healthy nutrition including fat, fiber, MyPlate, the role of sodium in heart healthy nutrition, utilization of the nutrition label, and utilization of this knowledge for meal planning. Follow up email sent as well. Written material given at graduation.   Biometrics:  Pre Biometrics - 06/10/23 1116       Pre Biometrics   Height 5' 5.7" (1.669 m)    Weight 115 lb (52.2 kg)    Waist Circumference 28.5 inches    Hip Circumference 36.3 inches    Waist to Hip Ratio 0.79 %    BMI (Calculated) 18.73    Single Leg Stand 15 seconds              Nutrition Therapy Plan and Nutrition Goals:  Nutrition Therapy & Goals - 06/16/23 1149       Nutrition  Therapy   Diet Mediterranean    Protein (specify units) 75-90    Fiber 25 grams    Whole Grain Foods 3 servings    Saturated Fats 15 max. grams    Fruits and Vegetables 5 servings/day    Sodium 2 grams      Personal Nutrition Goals   Nutrition Goal Eat 15-30gProtein and 30-60gCarbs at each meal.    Personal Goal #2 Reduce saturated fat, less than 12g per day. Replace bad fats for more heart healthy fats.    Comments Patient drinking mostly water, sometimes a carbonated water or ginger ale to settle her stomach. She eat 3 times per day, sometimes a snack or two. She knows she needs protein and tries to include one at each meals and sometimes her snacks have one too. She has been drinking a [employer] protein drinks (340kcal, 6gFat, 45gCHO, 38gSugar, 30gPro). We talked about the label, educated on how to read labels while also recommending a low sugar protein shake to try as well. She likes the sweetness of the [employer] shake. Overall, it will be fine, just try not to drink it with high carb meals or snacks. Reviewed mediterranean diet handout. Provided balanced plates sample handout, discussed calorie density and ways to help increase calories without increasing quantity. Explained her increased protein and calorie needs. She understands and will continue to prioritize protein and calories while choosing whole foods and eating smaller more frequent meals throughout the day.      Intervention Plan   Intervention Prescribe, educate and counsel regarding individualized specific dietary modifications aiming towards targeted core components such as weight, hypertension, lipid management, diabetes, heart failure and other comorbidities.;Nutrition handout(s) given to patient.    Expected Outcomes Short Term Goal: Understand basic principles of dietary content, such as calories, fat, sodium, cholesterol and nutrients.;Short Term Goal: A plan has been developed with personal nutrition goals set  during dietitian appointment.;Long Term Goal:  Adherence to prescribed nutrition plan.             Nutrition Assessments:  MEDIFICTS Score Key: >=70 Need to make dietary changes  40-70 Heart Healthy Diet <= 40 Therapeutic Level Cholesterol Diet   Picture Your Plate Scores: <16 Unhealthy dietary pattern with much room for improvement. 41-50 Dietary pattern unlikely to meet recommendations for good health and room for improvement. 51-60 More healthful dietary pattern, with some room for improvement.  >60 Healthy dietary pattern, although there may be some specific behaviors that could be improved.   Nutrition Goals Re-Evaluation:   Nutrition Goals Discharge (Final Nutrition Goals Re-Evaluation):   Psychosocial: Target Goals: Acknowledge presence or absence of significant depression and/or stress, maximize coping skills, provide positive support system. Participant is able to verbalize types and ability to use techniques and skills needed for reducing stress and depression.   Education: Stress, Anxiety, and Depression - Group verbal and visual presentation to define topics covered.  Reviews how body is impacted by stress, anxiety, and depression.  Also discusses healthy ways to reduce stress and to treat/manage anxiety and depression.  Written material given at graduation.   Education: Sleep Hygiene -Provides group verbal and written instruction about how sleep can affect your health.  Define sleep hygiene, discuss sleep cycles and impact of sleep habits. Review good sleep hygiene tips.    Initial Review & Psychosocial Screening:  Initial Psych Review & Screening - 05/26/23 1409       Initial Review   Current issues with None Identified      Family Dynamics   Good Support System? Yes      Barriers   Psychosocial barriers to participate in program There are no identifiable barriers or psychosocial needs.      Screening Interventions   Interventions Provide feedback  about the scores to participant;To provide support and resources with identified psychosocial needs;Encouraged to exercise    Expected Outcomes Short Term goal: Utilizing psychosocial counselor, staff and physician to assist with identification of specific Stressors or current issues interfering with healing process. Setting desired goal for each stressor or current issue identified.;Long Term Goal: Stressors or current issues are controlled or eliminated.;Short Term goal: Identification and review with participant of any Quality of Life or Depression concerns found by scoring the questionnaire.;Long Term goal: The participant improves quality of Life and PHQ9 Scores as seen by post scores and/or verbalization of changes             Quality of Life Scores:  Scores of 19 and below usually indicate a poorer quality of life in these areas.  A difference of  2-3 points is a clinically meaningful difference.  A difference of 2-3 points in the total score of the Quality of Life Index has been associated with significant improvement in overall quality of life, self-image, physical symptoms, and general health in studies assessing change in quality of life.  PHQ-9: Review Flowsheet  More data exists      06/10/2023 01/30/2023 09/02/2022 06/17/2022 03/17/2022  Depression screen PHQ 2/9  Decreased Interest 0 1 1 1 1   Down, Depressed, Hopeless 1 0 0 0 0  PHQ - 2 Score 1 1 1 1 1   Altered sleeping 2 - - 2 3  Tired, decreased energy 2 - - 1 2  Change in appetite 0 - - 0 1  Feeling bad or failure about yourself  0 - - 0 0  Trouble concentrating 1 - - 0 1  Moving slowly  or fidgety/restless 1 - - 1 1  Suicidal thoughts 0 - - 0 0  PHQ-9 Score 7 - - 5 9  Difficult doing work/chores Somewhat difficult - - - -   Interpretation of Total Score  Total Score Depression Severity:  1-4 = Minimal depression, 5-9 = Mild depression, 10-14 = Moderate depression, 15-19 = Moderately severe depression, 20-27 = Severe  depression   Psychosocial Evaluation and Intervention:  Psychosocial Evaluation - 05/26/23 1409       Psychosocial Evaluation & Interventions   Interventions Encouraged to exercise with the program and follow exercise prescription    Comments Mr. Mallin is coming to pulmonary rehab with COPD. During the initial phone call, she was under a time constraint and was not able to go into detail. She enjoys doing aquatic exercises when she can and is currently doing physical therapy for her shoulder. She wants to attend the program to work on her breathing and stamina.    Expected Outcomes Short: attend pulmonary rehab for education and exercise Long: develop and maintain positive self care habits.    Continue Psychosocial Services  Follow up required by staff             Psychosocial Re-Evaluation:   Psychosocial Discharge (Final Psychosocial Re-Evaluation):   Education: Education Goals: Education classes will be provided on a weekly basis, covering required topics. Participant will state understanding/return demonstration of topics presented.  Learning Barriers/Preferences:  Learning Barriers/Preferences - 05/26/23 1408       Learning Barriers/Preferences   Learning Barriers None    Learning Preferences Individual Instruction             General Pulmonary Education Topics:  Infection Prevention: - Provides verbal and written material to individual with discussion of infection control including proper hand washing and proper equipment cleaning during exercise session. Flowsheet Row Pulmonary Rehab from 06/10/2023 in [facility] Cardiac and Pulmonary Rehab  Date 06/10/23  Educator MB  Instruction Review Code 1- Verbalizes Understanding       Falls Prevention: - Provides verbal and written material to individual with discussion of falls prevention and safety. Flowsheet Row Pulmonary Rehab from 06/10/2023 in [facility] Cardiac and Pulmonary Rehab  Date 06/10/23  Educator MB  Instruction  Review Code 1- Verbalizes Understanding       Chronic Lung Disease Review: - Group verbal instruction with posters, models, PowerPoint presentations and videos,  to review new updates, new respiratory medications, new advancements in procedures and treatments. Providing information on websites and "800" numbers for continued self-education. Includes information about supplement oxygen, available portable oxygen systems, continuous and intermittent flow rates, oxygen safety, concentrators, and Medicare reimbursement for oxygen. Explanation of Pulmonary Drugs, including class, frequency, complications, importance of spacers, rinsing mouth after steroid MDI's, and proper cleaning methods for nebulizers. Review of basic lung anatomy and physiology related to function, structure, and complications of lung disease. Review of risk factors. Discussion about methods for diagnosing sleep apnea and types of masks and machines for OSA. Includes a review of the use of types of environmental controls: home humidity, furnaces, filters, dust mite/pet prevention, HEPA vacuums. Discussion about weather changes, air quality and the benefits of nasal washing. Instruction on Warning signs, infection symptoms, calling MD promptly, preventive modes, and value of vaccinations. Review of effective airway clearance, coughing and/or vibration techniques. Emphasizing that all should Create an Action Plan. Written material given at graduation.   AED/CPR: - Group verbal and written instruction with the use of models to demonstrate the basic  use of the AED with the basic ABC's of resuscitation.    Anatomy and Cardiac Procedures: - Group verbal and visual presentation and models provide information about basic cardiac anatomy and function. Reviews the testing methods done to diagnose heart disease and the outcomes of the test results. Describes the treatment choices: Medical Management, Angioplasty, or Coronary Bypass Surgery for  treating various heart conditions including Myocardial Infarction, Angina, Valve Disease, and Cardiac Arrhythmias.  Written material given at graduation.   Medication Safety: - Group verbal and visual instruction to review commonly prescribed medications for heart and lung disease. Reviews the medication, class of the drug, and side effects. Includes the steps to properly store meds and maintain the prescription regimen.  Written material given at graduation.   Other: -Provides group and verbal instruction on various topics (see comments)   Knowledge Questionnaire Score:    Core Components/Risk Factors/Patient Goals at Admission:  Personal Goals and Risk Factors at Admission - 06/10/23 1117       Core Components/Risk Factors/Patient Goals on Admission    Weight Management Yes;Weight Maintenance    Intervention Weight Management: Develop a combined nutrition and exercise program designed to reach desired caloric intake, while maintaining appropriate intake of nutrient and fiber, sodium and fats, and appropriate energy expenditure required for the weight goal.;Weight Management: Provide education and appropriate resources to help participant work on and attain dietary goals.;Weight Management/Obesity: Establish reasonable short term and long term weight goals.    Admit Weight 115 lb (52.2 kg)    Goal Weight: Short Term 115 lb (52.2 kg)    Goal Weight: Long Term 115 lb (52.2 kg)    Expected Outcomes Short Term: Continue to assess and modify interventions until short term weight is achieved;Long Term: Adherence to nutrition and physical activity/exercise program aimed toward attainment of established weight goal;Weight Maintenance: Understanding of the daily nutrition guidelines, which includes 25-35% calories from fat, 7% or less cal from saturated fats, less than 200mg  cholesterol, less than 1.5gm of sodium, & 5 or more servings of fruits and vegetables daily;Understanding recommendations for  meals to include 15-35% energy as protein, 25-35% energy from fat, 35-60% energy from carbohydrates, less than 200mg  of dietary cholesterol, 20-35 gm of total fiber daily;Understanding of distribution of calorie intake throughout the day with the consumption of 4-5 meals/snacks    Improve shortness of breath with ADL's Yes    Intervention Provide education, individualized exercise plan and daily activity instruction to help decrease symptoms of SOB with activities of daily living.    Expected Outcomes Short Term: Improve cardiorespiratory fitness to achieve a reduction of symptoms when performing ADLs;Long Term: Be able to perform more ADLs without symptoms or delay the onset of symptoms             Education:Diabetes - Individual verbal and written instruction to review signs/symptoms of diabetes, desired ranges of glucose level fasting, after meals and with exercise. Acknowledge that pre and post exercise glucose checks will be done for 3 sessions at entry of program.   Know Your Numbers and Heart Failure: - Group verbal and visual instruction to discuss disease risk factors for cardiac and pulmonary disease and treatment options.  Reviews associated critical values for Overweight/Obesity, Hypertension, Cholesterol, and Diabetes.  Discusses basics of heart failure: signs/symptoms and treatments.  Introduces Heart Failure Zone chart for action plan for heart failure.  Written material given at graduation.   Core Components/Risk Factors/Patient Goals Review:    Core Components/Risk Factors/Patient Goals at Discharge (  Final Review):    ITP Comments:  ITP Comments     Row Name 05/26/23 1414 06/10/23 1102 06/16/23 0924 06/17/23 0928     ITP Comments Initial phone call completed. Diagnosis can be found in [facility] 1/22. EP Orientation scheduled for Thursday 3/27 at 2pm. Completed and gym orientation. Initial ITP created and sent for review to Dr. Jinny Sanders, Medical Director. First full  day of exercise!  Patient was oriented to gym and equipment including functions, settings, policies, and procedures.  Patient's individual exercise prescription and treatment plan were reviewed.  All starting workloads were established based on the results of the 6 minute walk test done at initial orientation visit.  The plan for exercise progression was also introduced and progression will be customized based on patient's performance and goals. 30 Day review completed. Medical Director ITP review done, changes made as directed, and signed approval by Medical Director.    new to program             Comments:

## 2023-06-18 ENCOUNTER — Encounter: Admitting: *Deleted

## 2023-06-18 ENCOUNTER — Other Ambulatory Visit: Payer: Self-pay

## 2023-06-18 ENCOUNTER — Encounter: Payer: Self-pay | Admitting: Pharmacist

## 2023-06-18 ENCOUNTER — Other Ambulatory Visit (HOSPITAL_COMMUNITY): Payer: Self-pay

## 2023-06-18 ENCOUNTER — Ambulatory Visit: Payer: Self-pay

## 2023-06-18 DIAGNOSIS — J449 Chronic obstructive pulmonary disease, unspecified: Secondary | ICD-10-CM

## 2023-06-18 MED ORDER — BENZONATATE 100 MG PO CAPS
100.0000 mg | ORAL_CAPSULE | Freq: Two times a day (BID) | ORAL | 0 refills | Status: DC | PRN
  Filled 2023-06-18: qty 20, 10d supply, fill #0

## 2023-06-18 NOTE — Telephone Encounter (Signed)
 Patient called, left VM to return the call to the office to speak to NT.   Copied from CRM [id number]. Topic: Clinical - Prescription Issue >> Jun 15, 2023  2:27 PM Fredrica W wrote: Reason for CRM: Patient called back to check on previous inquiry. Has called multiple times. Spoke with Pharmacy today and they still have not received anything. Medication showing in chart but pharmacy has not received it. OTC cough syrup not working. Patient still has cough. Thank you >> Jun 18, 2023  9:16 AM Antwanette L wrote: Patient called back to check on previous inquiry.   OTC cough syrup not working. Patient still has cough.  The patient is requesting a call back at [phone]

## 2023-06-18 NOTE — Telephone Encounter (Addendum)
 06/18/2023-1055- 3rd attempt; Called patient to f/u on medication issue, [facility] [phone], will route to clinic for f/u  06/18/2023 1022-Called patient to f/u on medication issue. No answer, left vmail to call back [phone]   Patient called, left VM to return the call to the office to speak to NT.   Copied from CRM [id number]. Topic: Clinical - Prescription Issue >> Jun 15, 2023  2:27 PM Fredrica W wrote: Reason for CRM: Patient called back to check on previous inquiry. Has called multiple times. Spoke with Pharmacy today and they still have not received anything. Medication showing in chart but pharmacy has not received it. OTC cough syrup not working. Patient still has cough. Thank you >> Jun 18, 2023  9:16 AM Antwanette L wrote: Patient called back to check on previous inquiry.   OTC cough syrup not working. Patient still has cough.  The patient is requesting a call back at [phone]

## 2023-06-18 NOTE — Progress Notes (Signed)
 Daily Session Note  Patient Details  Name: FRANCHESKA VILLEDA MRN: [id number] Date of Birth: [birth year] Referring Provider:   Flowsheet Row Pulmonary Rehab from 06/10/2023 in [facility] Cardiac and Pulmonary Rehab  Referring Provider Janann Colonel, MD       Encounter Date: 06/18/2023  Check In:  Session Check In - 06/18/23 0928       Check-In   Supervising physician immediately available to respond to emergencies See telemetry face sheet for immediately available ER MD    Location [facility]-Cardiac & Pulmonary Rehab    Staff Present Cora Collum, RN, BSN, CCRP;Joseph Hood RCP,RRT,BSRT;Maxon [place] BS, Exercise Physiologist;Noah Tickle, BS, Exercise Physiologist    Virtual Visit No    Medication changes reported     No    Fall or balance concerns reported    No    Warm-up and Cool-down Performed on first and last piece of equipment    Resistance Training Performed Yes    VAD Patient? No    PAD/SET Patient? No      Pain Assessment   Currently in Pain? No/denies                Social History   Tobacco Use  Smoking Status Former   Current packs/day: 0.00   Average packs/day: 1 pack/day for 40.0 years (40.0 ttl pk-yrs)   Types: Cigarettes   Start date: 03/10/1981   Quit date: 03/10/2021   Years since quitting: 2.2   Passive exposure: Past  Smokeless Tobacco Never  Tobacco Comments   on nicotine patches    Goals Met:  Proper associated with RPD/PD & O2 Sat Independence with exercise equipment Exercise tolerated well No report of concerns or symptoms today  Goals Unmet:  Not Applicable  Comments: Pt able to follow exercise prescription today without complaint.  Will continue to monitor for progression.    Dr. Bethann Punches is Medical Director for [facility] Cardiac Rehabilitation.  Dr. Vida Rigger is Medical Director for [facility] Pulmonary Rehabilitation.

## 2023-06-18 NOTE — Telephone Encounter (Signed)
 Medication was resent original script was sent as phone in not electronic.

## 2023-06-19 ENCOUNTER — Other Ambulatory Visit (HOSPITAL_COMMUNITY): Payer: Self-pay

## 2023-06-19 NOTE — Telephone Encounter (Signed)
 Called but no answer. LVM informing that the requested prescription was sent to the pharmacy.

## 2023-06-19 NOTE — Telephone Encounter (Signed)
 Patient called back in today asking for an update. Informed her the prescription was sent yesterday. Called and confirmed with Carl Best member, Lorin Picket, patient's medication was shipped yesterday and the turn around time is 1 day so she should expect to receive it today. Patient verbalizes understanding.

## 2023-06-23 ENCOUNTER — Encounter: Admitting: *Deleted

## 2023-06-23 DIAGNOSIS — J449 Chronic obstructive pulmonary disease, unspecified: Secondary | ICD-10-CM

## 2023-06-23 NOTE — Progress Notes (Signed)
 Daily Session Note  Patient Details  Name: [name] MRN: [id number] Date of Birth: May 23, 1960 Referring Provider:   Flowsheet Row Pulmonary Rehab from 06/10/2023 in [facility] Cardiac and Pulmonary Rehab  Referring Provider Annitta Kindler, MD       Encounter Date: 06/23/2023  Check In:  Session Check In - 06/23/23 0942       Check-In   Supervising physician immediately available to respond to emergencies See telemetry face sheet for immediately available ER MD    Location [facility]-Cardiac & Pulmonary Rehab    Staff Present Maud Sorenson, RN, BSN, CCRP;Margaret Best, MS, Exercise Physiologist;Noah Tickle, BS, Exercise Physiologist;Maxon Conetta BS, Exercise Physiologist    Virtual Visit No    Medication changes reported     No    Fall or balance concerns reported    No    Warm-up and Cool-down Performed on first and last piece of equipment    Resistance Training Performed Yes    VAD Patient? No    PAD/SET Patient? No      Pain Assessment   Currently in Pain? No/denies                Social History   Tobacco Use  Smoking Status Former   Current packs/day: 0.00   Average packs/day: 1 pack/day for 40.0 years (40.0 ttl pk-yrs)   Types: Cigarettes   Start date: 03/10/1981   Quit date: 03/10/2021   Years since quitting: 2.2   Passive exposure: Past  Smokeless Tobacco Never  Tobacco Comments   on nicotine patches    Goals Met:  Proper associated with RPD/PD & O2 Sat Independence with exercise equipment Exercise tolerated well No report of concerns or symptoms today  Goals Unmet:  Not Applicable  Comments: Pt able to follow exercise prescription today without complaint.  Will continue to monitor for progression.    Dr. Firman Hughes is Medical Director for [facility] Cardiac Rehabilitation.  Dr. Fuad Aleskerov is Medical Director for [facility] Pulmonary Rehabilitation.

## 2023-06-25 ENCOUNTER — Encounter

## 2023-06-25 ENCOUNTER — Other Ambulatory Visit: Payer: Self-pay

## 2023-06-26 ENCOUNTER — Encounter: Admitting: *Deleted

## 2023-06-26 DIAGNOSIS — J449 Chronic obstructive pulmonary disease, unspecified: Secondary | ICD-10-CM | POA: Diagnosis not present

## 2023-06-26 NOTE — Progress Notes (Signed)
 Daily Session Note  Patient Details  Name: [name] MRN: [id number] Date of Birth: 07-07-1960 Referring Provider:   Flowsheet Row Pulmonary Rehab from 06/10/2023 in [facility] Cardiac and Pulmonary Rehab  Referring Provider Annitta Kindler, MD       Encounter Date: 06/26/2023  Check In:  Session Check In - 06/26/23 0942       Check-In   Supervising physician immediately available to respond to emergencies See telemetry face sheet for immediately available ER MD    Location [facility]-Cardiac & Pulmonary Rehab    Staff Present Dedra Fantasia RN,BSN;Noah Tickle, BS, Exercise Physiologist;Kelly Dawne Euler, ACSM CEP, Exercise Physiologist;Maxon Conetta BS, Exercise Physiologist    Virtual Visit No    Medication changes reported     No    Fall or balance concerns reported    No    Warm-up and Cool-down Performed on first and last piece of equipment    Resistance Training Performed Yes    VAD Patient? No    PAD/SET Patient? No      Pain Assessment   Currently in Pain? No/denies                Social History   Tobacco Use  Smoking Status Former   Current packs/day: 0.00   Average packs/day: 1 pack/day for 40.0 years (40.0 ttl pk-yrs)   Types: Cigarettes   Start date: 03/10/1981   Quit date: 03/10/2021   Years since quitting: 2.2   Passive exposure: Past  Smokeless Tobacco Never  Tobacco Comments   on nicotine  patches    Goals Met:  Proper associated with RPD/PD & O2 Sat Independence with exercise equipment Exercise tolerated well No report of concerns or symptoms today Strength training completed today  Goals Unmet:  Not Applicable  Comments: Pt able to follow exercise prescription today without complaint.  Will continue to monitor for progression.    Dr. Firman Hughes is Medical Director for [facility] Cardiac Rehabilitation.  Dr. Fuad Aleskerov is Medical Director for [facility] Pulmonary Rehabilitation.

## 2023-06-29 DIAGNOSIS — M9902 Segmental and somatic dysfunction of thoracic region: Secondary | ICD-10-CM | POA: Diagnosis not present

## 2023-06-29 DIAGNOSIS — M542 Cervicalgia: Secondary | ICD-10-CM | POA: Diagnosis not present

## 2023-06-29 DIAGNOSIS — M9901 Segmental and somatic dysfunction of cervical region: Secondary | ICD-10-CM | POA: Diagnosis not present

## 2023-06-29 DIAGNOSIS — M546 Pain in thoracic spine: Secondary | ICD-10-CM | POA: Diagnosis not present

## 2023-06-30 ENCOUNTER — Encounter: Admitting: *Deleted

## 2023-06-30 ENCOUNTER — Other Ambulatory Visit: Payer: Self-pay | Admitting: Internal Medicine

## 2023-06-30 ENCOUNTER — Other Ambulatory Visit (HOSPITAL_COMMUNITY): Payer: Self-pay

## 2023-06-30 DIAGNOSIS — J449 Chronic obstructive pulmonary disease, unspecified: Secondary | ICD-10-CM

## 2023-06-30 DIAGNOSIS — F17210 Nicotine dependence, cigarettes, uncomplicated: Secondary | ICD-10-CM

## 2023-06-30 NOTE — Progress Notes (Signed)
 Daily Session Note  Patient Details  Name: [name] MRN: [id number] Date of Birth: 1961-02-01 Referring Provider:   Flowsheet Row Pulmonary Rehab from 06/10/2023 in [facility] Cardiac and Pulmonary Rehab  Referring Provider Annitta Kindler, MD       Encounter Date: 06/30/2023  Check In:  Session Check In - 06/30/23 0926       Check-In   Supervising physician immediately available to respond to emergencies See telemetry face sheet for immediately available ER MD    Location [facility]-Cardiac & Pulmonary Rehab    Staff Present Lyell Samuel, MS, Exercise Physiologist;Noah Tickle, BS, Exercise Physiologist;Maxon Conetta BS, Exercise Physiologist;Markise Haymer, RN, BSN, CCRP    Virtual Visit No    Medication changes reported     No    Fall or balance concerns reported    No    Warm-up and Cool-down Performed on first and last piece of equipment    Resistance Training Performed Yes    VAD Patient? No    PAD/SET Patient? No      Pain Assessment   Currently in Pain? No/denies                Social History   Tobacco Use  Smoking Status Former   Current packs/day: 0.00   Average packs/day: 1 pack/day for 40.0 years (40.0 ttl pk-yrs)   Types: Cigarettes   Start date: 03/10/1981   Quit date: 03/10/2021   Years since quitting: 2.3   Passive exposure: Past  Smokeless Tobacco Never  Tobacco Comments   on nicotine  patches    Goals Met:  Proper associated with RPD/PD & O2 Sat Independence with exercise equipment Exercise tolerated well No report of concerns or symptoms today  Goals Unmet:  Not Applicable  Comments: Pt able to follow exercise prescription today without complaint.  Will continue to monitor for progression.    Dr. Firman Hughes is Medical Director for [facility] Cardiac Rehabilitation.  Dr. Fuad Aleskerov is Medical Director for [facility] Pulmonary Rehabilitation.

## 2023-07-01 ENCOUNTER — Other Ambulatory Visit: Payer: Self-pay

## 2023-07-02 ENCOUNTER — Other Ambulatory Visit: Payer: Self-pay

## 2023-07-02 ENCOUNTER — Encounter: Admitting: *Deleted

## 2023-07-02 DIAGNOSIS — J449 Chronic obstructive pulmonary disease, unspecified: Secondary | ICD-10-CM

## 2023-07-02 NOTE — Progress Notes (Signed)
 Daily Session Note  Patient Details  Name: [name] MRN: [id number] Date of Birth: 1960-04-23 Referring Provider:   Flowsheet Row Pulmonary Rehab from 06/10/2023 in [facility] Cardiac and Pulmonary Rehab  Referring Provider Annitta Kindler, MD       Encounter Date: 07/02/2023  Check In:  Session Check In - 07/02/23 0928       Check-In   Supervising physician immediately available to respond to emergencies See telemetry face sheet for immediately available ER MD    Location [facility]-Cardiac & Pulmonary Rehab    Staff Present Maud Sorenson, RN, BSN, CCRP;Joseph Hood RCP,RRT,BSRT;Noah Tickle, [place], Exercise Physiologist;Maxon Conetta BS, Exercise Physiologist    Virtual Visit No    Medication changes reported     No    Fall or balance concerns reported    No    Warm-up and Cool-down Performed on first and last piece of equipment    Resistance Training Performed Yes    VAD Patient? No    PAD/SET Patient? No      Pain Assessment   Currently in Pain? No/denies                Social History   Tobacco Use  Smoking Status Former   Current packs/day: 0.00   Average packs/day: 1 pack/day for 40.0 years (40.0 ttl pk-yrs)   Types: Cigarettes   Start date: 03/10/1981   Quit date: 03/10/2021   Years since quitting: 2.3   Passive exposure: Past  Smokeless Tobacco Never  Tobacco Comments   on nicotine  patches    Goals Met:  Proper associated with RPD/PD & O2 Sat Independence with exercise equipment Exercise tolerated well No report of concerns or symptoms today  Goals Unmet:  Not Applicable  Comments: Pt able to follow exercise prescription today without complaint.  Will continue to monitor for progression.    Dr. Firman Hughes is Medical Director for [facility] Cardiac Rehabilitation.  Dr. Fuad Aleskerov is Medical Director for [facility] Pulmonary Rehabilitation.

## 2023-07-07 ENCOUNTER — Encounter

## 2023-07-09 ENCOUNTER — Encounter

## 2023-07-14 ENCOUNTER — Other Ambulatory Visit (HOSPITAL_COMMUNITY): Payer: Self-pay

## 2023-07-14 ENCOUNTER — Encounter: Attending: Pulmonary Disease | Admitting: *Deleted

## 2023-07-14 DIAGNOSIS — J449 Chronic obstructive pulmonary disease, unspecified: Secondary | ICD-10-CM | POA: Diagnosis not present

## 2023-07-14 DIAGNOSIS — M542 Cervicalgia: Secondary | ICD-10-CM | POA: Diagnosis not present

## 2023-07-14 DIAGNOSIS — M9901 Segmental and somatic dysfunction of cervical region: Secondary | ICD-10-CM | POA: Diagnosis not present

## 2023-07-14 DIAGNOSIS — M9902 Segmental and somatic dysfunction of thoracic region: Secondary | ICD-10-CM | POA: Diagnosis not present

## 2023-07-14 DIAGNOSIS — M546 Pain in thoracic spine: Secondary | ICD-10-CM | POA: Diagnosis not present

## 2023-07-14 NOTE — Progress Notes (Signed)
 Daily Session Note  Patient Details  Name: [name] MRN: [id number] Date of Birth: 24-May-1960 Referring Provider:   Flowsheet Row Pulmonary Rehab from 06/10/2023 in [facility] Cardiac and Pulmonary Rehab  Referring Provider Annitta Kindler, MD       Encounter Date: 07/14/2023  Check In:  Session Check In - 07/14/23 0932       Check-In   Supervising physician immediately available to respond to emergencies See telemetry face sheet for immediately available ER MD    Location [facility]-Cardiac & Pulmonary Rehab    Staff Present Maud Sorenson, RN, BSN, CCRP;Margaret Best, MS, Exercise Physiologist;Maxon Conetta BS, Exercise Physiologist;Noah Tickle, BS, Exercise Physiologist    Virtual Visit No    Medication changes reported     No    Fall or balance concerns reported    No    Warm-up and Cool-down Performed on first and last piece of equipment    Resistance Training Performed Yes    VAD Patient? No    PAD/SET Patient? No      Pain Assessment   Currently in Pain? No/denies                Social History   Tobacco Use  Smoking Status Former   Current packs/day: 0.00   Average packs/day: 1 pack/day for 40.0 years (40.0 ttl pk-yrs)   Types: Cigarettes   Start date: 03/10/1981   Quit date: 03/10/2021   Years since quitting: 2.3   Passive exposure: Past  Smokeless Tobacco Never  Tobacco Comments   on nicotine  patches    Goals Met:  Proper associated with RPD/PD & O2 Sat Independence with exercise equipment Exercise tolerated well No report of concerns or symptoms today  Goals Unmet:  Not Applicable  Comments: Pt able to follow exercise prescription today without complaint.  Will continue to monitor for progression.    Dr. Firman Hughes is Medical Director for [facility] Cardiac Rehabilitation.  Dr. Fuad Aleskerov is Medical Director for [facility] Pulmonary Rehabilitation.

## 2023-07-15 ENCOUNTER — Encounter: Payer: Self-pay | Admitting: *Deleted

## 2023-07-15 DIAGNOSIS — J449 Chronic obstructive pulmonary disease, unspecified: Secondary | ICD-10-CM

## 2023-07-15 NOTE — Progress Notes (Signed)
 Pulmonary Individual Treatment Plan  Patient Details  Name: [name] MRN: [id number] Date of Birth: 30-Oct-1960 Referring Provider:   Flowsheet Row Pulmonary Rehab from 06/10/2023 in [facility] Cardiac and Pulmonary Rehab  Referring Provider Assaker, Marianne Shirts, MD       Initial Encounter Date:  Flowsheet Row Pulmonary Rehab from 06/10/2023 in [facility] Cardiac and Pulmonary Rehab  Date 06/10/23       Visit Diagnosis: Chronic obstructive pulmonary disease, unspecified COPD type (HCC)  Patient's Home Medications on Admission:  Current Outpatient Medications:    albuterol  (VENTOLIN  HFA) 108 (90 Base) MCG/ACT inhaler, TAKE 2 PUFFS BY MOUTH EVERY 6 HOURS AS NEEDED FOR WHEEZE OR SHORTNESS OF BREATH, Disp: 17 each, Rfl: 1   benzonatate  (TESSALON ) 100 MG capsule, Take 1 capsule (100 mg total) by mouth 2 (two) times daily as needed for cough., Disp: 20 capsule, Rfl: 0   budeson-glycopyrrolate -formoterol  (BREZTRI  AEROSPHERE) 160-9-4.8 MCG/ACT AERO inhaler, Inhale 2 puffs into the lungs 2 (two) times daily., Disp: 10.7 g, Rfl: 11   Calcium Carb-Cholecalciferol  (CALCIUM 1000 + D PO), Take by mouth., Disp: , Rfl:    COLLAGEN PO, Take by mouth daily., Disp: , Rfl:    cyanocobalamin  100 MCG tablet, Take 100 mcg by mouth daily., Disp: , Rfl:    etanercept  (ENBREL  MINI) 50 MG/ML injection, INJECT 50 MG INTO THE SKIN ONCE A WEEK., Disp: 4 mL, Rfl: 2   folic acid  (FOLVITE ) 1 MG tablet, Take 2 tablets (2 mg total) by mouth daily., Disp: 180 tablet, Rfl: 3   hydrOXYzine  (VISTARIL ) 25 MG capsule, Take 1 capsule (25 mg total) by mouth at bedtime., Disp: 30 capsule, Rfl: 3   methotrexate  (RHEUMATREX) 2.5 MG tablet, Take 8 tablets (20 mg total) by mouth once a week. Caution:Chemotherapy. Protect from light., Disp: 32 tablet, Rfl: 2   Methotrexate , PF, (OTREXUP ) 20 MG/0.4ML SOAJ, Inject 20 mg into the skin once a week., Disp: 1.6 mL, Rfl: 2   Multiple Vitamin (MULTIVITAMIN WITH MINERALS) TABS tablet, Take 1 tablet by  mouth daily., Disp: , Rfl:    nicotine  (NICODERM CQ  - DOSED IN MG/24 HR) 7 mg/24hr patch, Place 1 patch (7 mg total) onto the skin daily. Please schedule appt with PCP for additional refills., Disp: 28 patch, Rfl: 0   tamoxifen  (NOLVADEX ) 20 MG tablet, Take 1 tablet (20 mg total) by mouth daily., Disp: 90 tablet, Rfl: 1   VITAMIN D  PO, Take by mouth daily., Disp: , Rfl:   Past Medical History: Past Medical History:  Diagnosis Date   Anemia    during pregnancy   Anxiety    Asthma    Bronchitis age 7   Cancer (HCC)    left breast ca   COPD (chronic obstructive pulmonary disease) (HCC)    Depression    Hx of bladder infections    been over 5 years since last one    Malignant neoplasm of upper-outer quadrant of left breast in female, estrogen receptor positive (HCC)    Rheumatoid arthritis (HCC)    unc rheum    Tobacco abuse    Trouble in sleeping     Tobacco Use: Social History   Tobacco Use  Smoking Status Former   Current packs/day: 0.00   Average packs/day: 1 pack/day for 40.0 years (40.0 ttl pk-yrs)   Types: Cigarettes   Start date: 03/10/1981   Quit date: 03/10/2021   Years since quitting: 2.3   Passive exposure: Past  Smokeless Tobacco Never  Tobacco Comments  on nicotine  patches    Labs: Review Flowsheet       Latest Ref Rng & Units 01/15/2018 07/30/2021  Labs for ITP Cardiac and Pulmonary Rehab  Cholestrol 100 - 199 mg/dL 161  096   LDL (calc) 0 - 99 mg/dL 67  84   HDL-C >04 mg/dL 77  71   Trlycerides 0 - 149 mg/dL 540  981      Pulmonary Assessment Scores:  Pulmonary Assessment Scores     Row Name 06/10/23 1117         mMRC Score   mMRC Score 3              UCSD: Self-administered rating of dyspnea associated with activities of daily living (ADLs) 6-point scale (0 = "not at all" to 5 = "maximal or unable to do because of breathlessness")  Scoring Scores range from 0 to 120.  Minimally important difference is 5 units  CAT: CAT can identify  the health impairment of COPD patients and is better correlated with disease progression.  CAT has a scoring range of zero to 40. The CAT score is classified into four groups of low (less than 10), medium (10 - 20), high (21-30) and very high (31-40) based on the impact level of disease on health status. A CAT score over 10 suggests significant symptoms.  A worsening CAT score could be explained by an exacerbation, poor medication adherence, poor inhaler technique, or progression of COPD or comorbid conditions.  CAT MCID is 2 points  mMRC: mMRC (Modified Medical Research Council) Dyspnea Scale is used to assess the degree of baseline functional disability in patients of respiratory disease due to dyspnea. No minimal important difference is established. A decrease in score of 1 point or greater is considered a positive change.   Pulmonary Function Assessment:   Exercise Target Goals: Exercise Program Goal: Individual exercise prescription set using results from initial 6 min walk test and THRR while considering  patient's activity barriers and safety.   Exercise Prescription Goal: Initial exercise prescription builds to 30-45 minutes a day of aerobic activity, 2-3 days per week.  Home exercise guidelines will be given to patient during program as part of exercise prescription that the participant will acknowledge.  Education: Aerobic Exercise: - Group verbal and visual presentation on the components of exercise prescription. Introduces F.I.T.T principle from ACSM for exercise prescriptions.  Reviews F.I.T.T. principles of aerobic exercise including progression. Written material given at graduation.   Education: Resistance Exercise: - Group verbal and visual presentation on the components of exercise prescription. Introduces F.I.T.T principle from ACSM for exercise prescriptions  Reviews F.I.T.T. principles of resistance exercise including progression. Written material given at graduation.     Education: Exercise & Equipment Safety: - Individual verbal instruction and demonstration of equipment use and safety with use of the equipment. Flowsheet Row Pulmonary Rehab from 07/02/2023 in [facility] Cardiac and Pulmonary Rehab  Date 06/10/23  Educator MB  Instruction Review Code 1- Verbalizes Understanding       Education: Exercise Physiology & General Exercise Guidelines: - Group verbal and written instruction with models to review the exercise physiology of the cardiovascular system and associated critical values. Provides general exercise guidelines with specific guidelines to those with heart or lung disease.    Education: Flexibility, Balance, Mind/Body Relaxation: - Group verbal and visual presentation with interactive activity on the components of exercise prescription. Introduces F.I.T.T principle from ACSM for exercise prescriptions. Reviews F.I.T.T. principles of flexibility and balance exercise training  including progression. Also discusses the mind body connection.  Reviews various relaxation techniques to help reduce and manage stress (i.e. Deep breathing, progressive muscle relaxation, and visualization). Balance handout provided to take home. Written material given at graduation.   Activity Barriers & Risk Stratification:  Activity Barriers & Cardiac Risk Stratification - 06/10/23 1108       Activity Barriers & Cardiac Risk Stratification   Activity Barriers Arthritis;Joint Problems   R shoulder, and RA            6 Minute Walk:  6 Minute Walk     Row Name 06/10/23 1103         6 Minute Walk   Phase Initial     Distance 1305 feet     Walk Time 6 minutes     # of Rest Breaks 0     MPH 2.47     METS 3.91     RPE 9     Perceived Dyspnea  3     VO2 Peak 13.69     Symptoms No     Resting HR 86 bpm     Resting BP 104/70     Resting Oxygen Saturation  92 %     Exercise Oxygen Saturation  during 6 min walk 87 %     Max Ex. HR 121 bpm     Max Ex. BP 132/62      2 Minute Post BP 102/64       Interval HR   1 Minute HR 103     2 Minute HR 114     3 Minute HR 116     4 Minute HR 114     5 Minute HR 117     6 Minute HR 121     2 Minute Post HR 107     Interval Heart Rate? Yes       Interval Oxygen   Interval Oxygen? Yes     Baseline Oxygen Saturation % 92 %     1 Minute Oxygen Saturation % 91 %     1 Minute Liters of Oxygen 0 L     2 Minute Oxygen Saturation % 90 %     2 Minute Liters of Oxygen 0 L     3 Minute Oxygen Saturation % 88 %     3 Minute Liters of Oxygen 0 L     4 Minute Oxygen Saturation % 88 %     4 Minute Liters of Oxygen 0 L     5 Minute Oxygen Saturation % 87 %     5 Minute Liters of Oxygen 0 L     6 Minute Oxygen Saturation % 88 %     6 Minute Liters of Oxygen 0 L     2 Minute Post Oxygen Saturation % 94 %     2 Minute Post Liters of Oxygen 0 L             Oxygen Initial Assessment:  Oxygen Initial Assessment - 05/26/23 1405       Home Oxygen   Home Oxygen Device None    Sleep Oxygen Prescription None    Home Exercise Oxygen Prescription None    Home Resting Oxygen Prescription None    Compliance with Home Oxygen Use Yes      Intervention   Short Term Goals To learn and understand importance of monitoring SPO2 with pulse oximeter and demonstrate accurate use of the pulse oximeter.;To learn and understand  importance of maintaining oxygen saturations>88%;To learn and demonstrate proper pursed lip breathing techniques or other breathing techniques. ;To learn and demonstrate proper use of respiratory medications    Long  Term Goals Maintenance of O2 saturations>88%;Verbalizes importance of monitoring SPO2 with pulse oximeter and return demonstration;Exhibits proper breathing techniques, such as pursed lip breathing or other method taught during program session;Compliance with respiratory medication;Demonstrates proper use of MDI's             Oxygen Re-Evaluation:  Oxygen Re-Evaluation     Row Name  06/16/23 0925             Program Oxygen Prescription   Program Oxygen Prescription None         Home Oxygen   Home Oxygen Device None       Home Exercise Oxygen Prescription None       Home Resting Oxygen Prescription None       Compliance with Home Oxygen Use Yes         Goals/Expected Outcomes   Short Term Goals To learn and demonstrate proper pursed lip breathing techniques or other breathing techniques.        Long  Term Goals Exhibits proper breathing techniques, such as pursed lip breathing or other method taught during program session       Comments Reviewed PLB technique with pt.  Talked about how it works and it's importance in maintaining their exercise saturations.       Goals/Expected Outcomes Short: Become more profiecient at using PLB. Long: Become independent at using PLB.                Oxygen Discharge (Final Oxygen Re-Evaluation):  Oxygen Re-Evaluation - 06/16/23 0925       Program Oxygen Prescription   Program Oxygen Prescription None      Home Oxygen   Home Oxygen Device None    Home Exercise Oxygen Prescription None    Home Resting Oxygen Prescription None    Compliance with Home Oxygen Use Yes      Goals/Expected Outcomes   Short Term Goals To learn and demonstrate proper pursed lip breathing techniques or other breathing techniques.     Long  Term Goals Exhibits proper breathing techniques, such as pursed lip breathing or other method taught during program session    Comments Reviewed PLB technique with pt.  Talked about how it works and it's importance in maintaining their exercise saturations.    Goals/Expected Outcomes Short: Become more profiecient at using PLB. Long: Become independent at using PLB.             Initial Exercise Prescription:  Initial Exercise Prescription - 06/10/23 1100       Date of Initial Exercise RX and Referring Provider   Date 06/10/23    Referring Provider Assaker, Marianne Shirts, MD      Oxygen   Maintain  Oxygen Saturation 88% or higher      Treadmill   MPH 2.4   Try   Grade 0    Minutes 15    METs 2.84      REL-XR   Level 3    Speed 50    Minutes 15    METs 3.91      T5 Nustep   Level 3    SPM 80    Minutes 15    METs 3.91      Track   Laps 34    Minutes 15    METs 2.85  Prescription Details   Frequency (times per week) 2    Duration Progress to 30 minutes of continuous aerobic without signs/symptoms of physical distress      Intensity   THRR 40-80% of Max Heartrate 114-142    Ratings of Perceived Exertion 11-13    Perceived Dyspnea 0-4      Progression   Progression Continue to progress workloads to maintain intensity without signs/symptoms of physical distress.      Resistance Training   Training Prescription Yes    Weight 3 lb    Reps 10-15             Perform Capillary Blood Glucose checks as needed.  Exercise Prescription Changes:   Exercise Prescription Changes     Row Name 06/10/23 1100 07/01/23 1400 07/15/23 1100         Response to Exercise   Blood Pressure (Admit) 104/70 108/58 118/62     Blood Pressure (Exercise) 132/62 132/72 134/64     Blood Pressure (Exit) 102/64 120/70 112/60     Heart Rate (Admit) 86 bpm 82 bpm 86 bpm     Heart Rate (Exercise) 121 bpm 125 bpm 130 bpm     Heart Rate (Exit) 107 bpm 100 bpm 107 bpm     Oxygen Saturation (Admit) 92 % 94 % 94 %     Oxygen Saturation (Exercise) 87 % 89 % 90 %     Oxygen Saturation (Exit) 94 % 92 % 91 %     Rating of Perceived Exertion (Exercise) 9 13 13      Perceived Dyspnea (Exercise) 3 3 3      Symptoms none none none     Comments results First 2 weeks of exercise --     Duration Progress to 30 minutes of  aerobic without signs/symptoms of physical distress Progress to 30 minutes of  aerobic without signs/symptoms of physical distress Progress to 30 minutes of  aerobic without signs/symptoms of physical distress     Intensity THRR New THRR unchanged THRR unchanged        Progression   Progression Continue to progress workloads to maintain intensity without signs/symptoms of physical distress. Continue to progress workloads to maintain intensity without signs/symptoms of physical distress. Continue to progress workloads to maintain intensity without signs/symptoms of physical distress.     Average METs 3.91 3.3 2.98       Resistance Training   Training Prescription -- Yes Yes     Weight -- 3 lb 3 lb     Reps -- 10-15 10-15       Interval Training   Interval Training -- No No       Oxygen   Oxygen -- Continuous --       Treadmill   MPH -- 1.7 --     Grade -- 0 --     Minutes -- 15 --     METs -- 2.3 --       NuStep   Level -- 3 3     Minutes -- 15 15     METs -- 3.4 3.7       REL-XR   Level -- 1 1     Minutes -- 15 15     METs -- 4.8 --       Track   Laps -- 45 37     Minutes -- 15 15     METs -- 3.45 3.01       Oxygen   Maintain  Oxygen Saturation -- 88% or higher 88% or higher              Exercise Comments:   Exercise Goals and Review:   Exercise Goals     Row Name 06/10/23 1116             Exercise Goals   Increase Physical Activity Yes       Intervention Provide advice, education, support and counseling about physical activity/exercise needs.;Develop an individualized exercise prescription for aerobic and resistive training based on initial evaluation findings, risk stratification, comorbidities and participant's personal goals.       Expected Outcomes Short Term: Attend rehab on a regular basis to increase amount of physical activity.;Long Term: Add in home exercise to make exercise part of routine and to increase amount of physical activity.;Long Term: Exercising regularly at least 3-5 days a week.       Increase Strength and Stamina Yes       Intervention Provide advice, education, support and counseling about physical activity/exercise needs.;Develop an individualized exercise prescription for aerobic and resistive  training based on initial evaluation findings, risk stratification, comorbidities and participant's personal goals.       Expected Outcomes Short Term: Increase workloads from initial exercise prescription for resistance, speed, and METs.;Short Term: Perform resistance training exercises routinely during rehab and add in resistance training at home;Long Term: Improve cardiorespiratory fitness, muscular endurance and strength as measured by increased METs and functional capacity ( )       Able to understand and use rate of perceived exertion (RPE) scale Yes       Intervention Provide education and explanation on how to use RPE scale       Expected Outcomes Short Term: Able to use RPE daily in rehab to express subjective intensity level;Long Term:  Able to use RPE to guide intensity level when exercising independently       Able to understand and use Dyspnea scale Yes       Intervention Provide education and explanation on how to use Dyspnea scale       Expected Outcomes Short Term: Able to use Dyspnea scale daily in rehab to express subjective sense of shortness of breath during exertion;Long Term: Able to use Dyspnea scale to guide intensity level when exercising independently       Knowledge and understanding of Target Heart Rate Range (THRR) Yes       Intervention Provide education and explanation of THRR including how the numbers were predicted and where they are located for reference       Expected Outcomes Short Term: Able to state/look up THRR;Short Term: Able to use daily as guideline for intensity in rehab;Long Term: Able to use THRR to govern intensity when exercising independently       Able to check pulse independently Yes       Intervention Provide education and demonstration on how to check pulse in carotid and radial arteries.;Review the importance of being able to check your own pulse for safety during independent exercise       Expected Outcomes Short Term: Able to explain why pulse  checking is important during independent exercise;Long Term: Able to check pulse independently and accurately       Understanding of Exercise Prescription Yes       Intervention Provide education, explanation, and written materials on patient's individual exercise prescription       Expected Outcomes Short Term: Able to explain program exercise prescription;Long Term: Able to explain  home exercise prescription to exercise independently                Exercise Goals Re-Evaluation :  Exercise Goals Re-Evaluation     Row Name 06/16/23 1610 07/01/23 1405 07/15/23 1132         Exercise Goal Re-Evaluation   Exercise Goals Review Able to understand and use rate of perceived exertion (RPE) scale;Able to understand and use Dyspnea scale;Knowledge and understanding of Target Heart Rate Range (THRR);Understanding of Exercise Prescription Increase Physical Activity;Increase Strength and Stamina;Understanding of Exercise Prescription Increase Physical Activity;Increase Strength and Stamina;Understanding of Exercise Prescription     Comments Reviewed RPE and dyspnea scale, THR and program prescription with pt today.  Pt voiced understanding and was given a copy of goals to take home. Genice is off to a good start in the program. She was able to attend her first few sessions in the program during the time of this review. She used the treadmill at a speed of 1. and no incline, and was able to walk 45 laps in 15 minutes. We will continue to monitor her progress in the program. Ermagene continues to do well in the program. She has maintained her workload on the T4 nustep at level 3, and on the XR at level 1. She was able to walk 37 laps on the track in 15 minutes. We will continue to monitor her progress in the program.     Expected Outcomes Short: Use RPE daily to regulate intensity. Long: Follow program prescription in THR. Short: Continue to follow exercise prescription. Long: Continue exercise to improve  strength and stamina. Short: Continue to follow exercise prescription. Long: Continue exercise to improve strength and stamina.              Discharge Exercise Prescription (Final Exercise Prescription Changes):  Exercise Prescription Changes - 07/15/23 1100       Response to Exercise   Blood Pressure (Admit) 118/62    Blood Pressure (Exercise) 134/64    Blood Pressure (Exit) 112/60    Heart Rate (Admit) 86 bpm    Heart Rate (Exercise) 130 bpm    Heart Rate (Exit) 107 bpm    Oxygen Saturation (Admit) 94 %    Oxygen Saturation (Exercise) 90 %    Oxygen Saturation (Exit) 91 %    Rating of Perceived Exertion (Exercise) 13    Perceived Dyspnea (Exercise) 3    Symptoms none    Duration Progress to 30 minutes of  aerobic without signs/symptoms of physical distress    Intensity THRR unchanged      Progression   Progression Continue to progress workloads to maintain intensity without signs/symptoms of physical distress.    Average METs 2.98      Resistance Training   Training Prescription Yes    Weight 3 lb    Reps 10-15      Interval Training   Interval Training No      NuStep   Level 3    Minutes 15    METs 3.7      REL-XR   Level 1    Minutes 15      Track   Laps 37    Minutes 15    METs 3.01      Oxygen   Maintain Oxygen Saturation 88% or higher             Nutrition:  Target Goals: Understanding of nutrition guidelines, daily intake of sodium 1500mg , cholesterol 200mg , calories 30% from  fat and 7% or less from saturated fats, daily to have 5 or more servings of fruits and vegetables.  Education: All About Nutrition: -Group instruction provided by verbal, written material, interactive activities, discussions, models, and posters to present general guidelines for heart healthy nutrition including fat, fiber, MyPlate, the role of sodium in heart healthy nutrition, utilization of the nutrition label, and utilization of this knowledge for meal planning.  Follow up email sent as well. Written material given at graduation.   Biometrics:  Pre Biometrics - 06/10/23 1116       Pre Biometrics   Height 5' 5.7" (1.669 m)    Weight 115 lb (52.2 kg)    Waist Circumference 28.5 inches    Hip Circumference 36.3 inches    Waist to Hip Ratio 0.79 %    BMI (Calculated) 18.73    Single Leg Stand 15 seconds              Nutrition Therapy Plan and Nutrition Goals:  Nutrition Therapy & Goals - 06/16/23 1149       Nutrition Therapy   Diet Mediterranean    Protein (specify units) 75-90    Fiber 25 grams    Whole Grain Foods 3 servings    Saturated Fats 15 max. grams    Fruits and Vegetables 5 servings/day    Sodium 2 grams      Personal Nutrition Goals   Nutrition Goal Eat 15-30gProtein and 30-60gCarbs at each meal.    Personal Goal #2 Reduce saturated fat, less than 12g per day. Replace bad fats for more heart healthy fats.    Comments Patient drinking mostly water , sometimes a carbonated water  or ginger ale to settle her stomach. She eat 3 times per day, sometimes a snack or two. She knows she needs protein and tries to include one at each meals and sometimes her snacks have one too. She has been drinking a [employer] protein drinks (340kcal, 6gFat, 45gCHO, 38gSugar, 30gPro). We talked about the label, educated on how to read labels while also recommending a low sugar protein shake to try as well. She likes the sweetness of the [employer] shake. Overall, it will be fine, just try not to drink it with high carb meals or snacks. Reviewed mediterranean diet handout. Provided balanced plates sample handout, discussed calorie density and ways to help increase calories without increasing quantity. Explained her increased protein and calorie needs. She understands and will continue to prioritize protein and calories while choosing whole foods and eating smaller more frequent meals throughout the day.      Intervention Plan   Intervention  Prescribe, educate and counsel regarding individualized specific dietary modifications aiming towards targeted core components such as weight, hypertension, lipid management, diabetes, heart failure and other comorbidities.;Nutrition handout(s) given to patient.    Expected Outcomes Short Term Goal: Understand basic principles of dietary content, such as calories, fat, sodium, cholesterol and nutrients.;Short Term Goal: A plan has been developed with personal nutrition goals set during dietitian appointment.;Long Term Goal: Adherence to prescribed nutrition plan.             Nutrition Assessments:  MEDIFICTS Score Key: >=70 Need to make dietary changes  40-70 Heart Healthy Diet <= 40 Therapeutic Level Cholesterol Diet   Picture Your Plate Scores: <82 Unhealthy dietary pattern with much room for improvement. 41-50 Dietary pattern unlikely to meet recommendations for good health and room for improvement. 51-60 More healthful dietary pattern, with some room for improvement.  >60 Healthy  dietary pattern, although there may be some specific behaviors that could be improved.   Nutrition Goals Re-Evaluation:   Nutrition Goals Discharge (Final Nutrition Goals Re-Evaluation):   Psychosocial: Target Goals: Acknowledge presence or absence of significant depression and/or stress, maximize coping skills, provide positive support system. Participant is able to verbalize types and ability to use techniques and skills needed for reducing stress and depression.   Education: Stress, Anxiety, and Depression - Group verbal and visual presentation to define topics covered.  Reviews how body is impacted by stress, anxiety, and depression.  Also discusses healthy ways to reduce stress and to treat/manage anxiety and depression.  Written material given at graduation.   Education: Sleep Hygiene -Provides group verbal and written instruction about how sleep can affect your health.  Define sleep hygiene,  discuss sleep cycles and impact of sleep habits. Review good sleep hygiene tips.    Initial Review & Psychosocial Screening:  Initial Psych Review & Screening - 05/26/23 1409       Initial Review   Current issues with None Identified      Family Dynamics   Good Support System? Yes      Barriers   Psychosocial barriers to participate in program There are no identifiable barriers or psychosocial needs.      Screening Interventions   Interventions Provide feedback about the scores to participant;To provide support and resources with identified psychosocial needs;Encouraged to exercise    Expected Outcomes Short Term goal: Utilizing psychosocial counselor, staff and physician to assist with identification of specific Stressors or current issues interfering with healing process. Setting desired goal for each stressor or current issue identified.;Long Term Goal: Stressors or current issues are controlled or eliminated.;Short Term goal: Identification and review with participant of any Quality of Life or Depression concerns found by scoring the questionnaire.;Long Term goal: The participant improves quality of Life and PHQ9 Scores as seen by post scores and/or verbalization of changes             Quality of Life Scores:  Scores of 19 and below usually indicate a poorer quality of life in these areas.  A difference of  2-3 points is a clinically meaningful difference.  A difference of 2-3 points in the total score of the Quality of Life Index has been associated with significant improvement in overall quality of life, self-image, physical symptoms, and general health in studies assessing change in quality of life.  PHQ-9: Review Flowsheet  More data exists      06/10/2023 01/30/2023 09/02/2022 06/17/2022 03/17/2022  Depression screen PHQ 2/9  Decreased Interest 0 1 1 1 1   Down, Depressed, Hopeless 1 0 0 0 0  PHQ - 2 Score 1 1 1 1 1   Altered sleeping 2 - - 2 3  Tired, decreased energy 2 - - 1  2  Change in appetite 0 - - 0 1  Feeling bad or failure about yourself  0 - - 0 0  Trouble concentrating 1 - - 0 1  Moving slowly or fidgety/restless 1 - - 1 1  Suicidal thoughts 0 - - 0 0  PHQ-9 Score 7 - - 5 9  Difficult doing work/chores Somewhat difficult - - - -   Interpretation of Total Score  Total Score Depression Severity:  1-4 = Minimal depression, 5-9 = Mild depression, 10-14 = Moderate depression, 15-19 = Moderately severe depression, 20-27 = Severe depression   Psychosocial Evaluation and Intervention:  Psychosocial Evaluation - 05/26/23 1409  Psychosocial Evaluation & Interventions   Interventions Encouraged to exercise with the program and follow exercise prescription    Comments Mr. Nienaber is coming to pulmonary rehab with COPD. During the initial phone call, she was under a time constraint and was not able to go into detail. She enjoys doing aquatic exercises when she can and is currently doing physical therapy for her shoulder. She wants to attend the program to work on her breathing and stamina.    Expected Outcomes Short: attend pulmonary rehab for education and exercise Long: develop and maintain positive self care habits.    Continue Psychosocial Services  Follow up required by staff             Psychosocial Re-Evaluation:   Psychosocial Discharge (Final Psychosocial Re-Evaluation):   Education: Education Goals: Education classes will be provided on a weekly basis, covering required topics. Participant will state understanding/return demonstration of topics presented.  Learning Barriers/Preferences:  Learning Barriers/Preferences - 05/26/23 1408       Learning Barriers/Preferences   Learning Barriers None    Learning Preferences Individual Instruction             General Pulmonary Education Topics:  Infection Prevention: - Provides verbal and written material to individual with discussion of infection control including proper hand  washing and proper equipment cleaning during exercise session. Flowsheet Row Pulmonary Rehab from 07/02/2023 in [facility] Cardiac and Pulmonary Rehab  Date 06/10/23  Educator MB  Instruction Review Code 1- Verbalizes Understanding       Falls Prevention: - Provides verbal and written material to individual with discussion of falls prevention and safety. Flowsheet Row Pulmonary Rehab from 07/02/2023 in [facility] Cardiac and Pulmonary Rehab  Date 06/10/23  Educator MB  Instruction Review Code 1- Verbalizes Understanding       Chronic Lung Disease Review: - Group verbal instruction with posters, models, PowerPoint presentations and videos,  to review new updates, new respiratory medications, new advancements in procedures and treatments. Providing information on websites and "800" numbers for continued self-education. Includes information about supplement oxygen, available portable oxygen systems, continuous and intermittent flow rates, oxygen safety, concentrators, and Medicare reimbursement for oxygen. Explanation of Pulmonary Drugs, including class, frequency, complications, importance of spacers, rinsing mouth after steroid MDI's, and proper cleaning methods for nebulizers. Review of basic lung anatomy and physiology related to function, structure, and complications of lung disease. Review of risk factors. Discussion about methods for diagnosing sleep apnea and types of masks and machines for OSA. Includes a review of the use of types of environmental controls: home humidity, furnaces, filters, dust mite/pet prevention, HEPA vacuums. Discussion about weather changes, air quality and the benefits of nasal washing. Instruction on Warning signs, infection symptoms, calling MD promptly, preventive modes, and value of vaccinations. Review of effective airway clearance, coughing and/or vibration techniques. Emphasizing that all should Create an Action Plan. Written material given at graduation.   AED/CPR: -  Group verbal and written instruction with the use of models to demonstrate the basic use of the AED with the basic ABC's of resuscitation.    Anatomy and Cardiac Procedures: - Group verbal and visual presentation and models provide information about basic cardiac anatomy and function. Reviews the testing methods done to diagnose heart disease and the outcomes of the test results. Describes the treatment choices: Medical Management, Angioplasty, or Coronary Bypass Surgery for treating various heart conditions including Myocardial Infarction, Angina, Valve Disease, and Cardiac Arrhythmias.  Written material given at graduation.   Medication Safety: -  Group verbal and visual instruction to review commonly prescribed medications for heart and lung disease. Reviews the medication, class of the drug, and side effects. Includes the steps to properly store meds and maintain the prescription regimen.  Written material given at graduation.   Other: -Provides group and verbal instruction on various topics (see comments)   Knowledge Questionnaire Score:    Core Components/Risk Factors/Patient Goals at Admission:  Personal Goals and Risk Factors at Admission - 06/10/23 1117       Core Components/Risk Factors/Patient Goals on Admission    Weight Management Yes;Weight Maintenance    Intervention Weight Management: Develop a combined nutrition and exercise program designed to reach desired caloric intake, while maintaining appropriate intake of nutrient and fiber, sodium and fats, and appropriate energy expenditure required for the weight goal.;Weight Management: Provide education and appropriate resources to help participant work on and attain dietary goals.;Weight Management/Obesity: Establish reasonable short term and long term weight goals.    Admit Weight 115 lb (52.2 kg)    Goal Weight: Short Term 115 lb (52.2 kg)    Goal Weight: Long Term 115 lb (52.2 kg)    Expected Outcomes Short Term: Continue  to assess and modify interventions until short term weight is achieved;Long Term: Adherence to nutrition and physical activity/exercise program aimed toward attainment of established weight goal;Weight Maintenance: Understanding of the daily nutrition guidelines, which includes 25-35% calories from fat, 7% or less cal from saturated fats, less than 200mg  cholesterol, less than 1.5gm of sodium, & 5 or more servings of fruits and vegetables daily;Understanding recommendations for meals to include 15-35% energy as protein, 25-35% energy from fat, 35-60% energy from carbohydrates, less than 200mg  of dietary cholesterol, 20-35 gm of total fiber daily;Understanding of distribution of calorie intake throughout the day with the consumption of 4-5 meals/snacks    Improve shortness of breath with ADL's Yes    Intervention Provide education, individualized exercise plan and daily activity instruction to help decrease symptoms of SOB with activities of daily living.    Expected Outcomes Short Term: Improve cardiorespiratory fitness to achieve a reduction of symptoms when performing ADLs;Long Term: Be able to perform more ADLs without symptoms or delay the onset of symptoms             Education:Diabetes - Individual verbal and written instruction to review signs/symptoms of diabetes, desired ranges of glucose level fasting, after meals and with exercise. Acknowledge that pre and post exercise glucose checks will be done for 3 sessions at entry of program.   Know Your Numbers and Heart Failure: - Group verbal and visual instruction to discuss disease risk factors for cardiac and pulmonary disease and treatment options.  Reviews associated critical values for Overweight/Obesity, Hypertension, Cholesterol, and Diabetes.  Discusses basics of heart failure: signs/symptoms and treatments.  Introduces Heart Failure Zone chart for action plan for heart failure.  Written material given at graduation. Flowsheet Row  Pulmonary Rehab from 07/02/2023 in [facility] Cardiac and Pulmonary Rehab  Date 07/02/23  Educator SB  Instruction Review Code 1- Verbalizes Understanding       Core Components/Risk Factors/Patient Goals Review:    Core Components/Risk Factors/Patient Goals at Discharge (Final Review):    ITP Comments:  ITP Comments     Row Name 05/26/23 1414 06/10/23 1102 06/16/23 0924 06/17/23 0928 07/15/23 1333   ITP Comments Initial phone call completed. Diagnosis can [facility] in [facility] 1/22. EP Orientation scheduled for Thursday 3/27 at 2pm. Completed and gym orientation. Initial ITP  created and sent for review to Dr. Faud Aleskerov, Medical Director. First full day of exercise!  Patient was oriented to gym and equipment including functions, settings, policies, and procedures.  Patient's individual exercise prescription and treatment plan were reviewed.  All starting workloads were established based on the results of the 6 minute walk test done at initial orientation visit.  The plan for exercise progression was also introduced and progression will be customized based on patient's performance and goals. 30 Day review completed. Medical Director ITP review done, changes made as directed, and signed approval by Medical Director.    new to program 30 Day review completed. Medical Director ITP review done, changes made as directed, and signed approval by Medical Director.            Comments:

## 2023-07-16 ENCOUNTER — Encounter: Admitting: *Deleted

## 2023-07-16 DIAGNOSIS — J449 Chronic obstructive pulmonary disease, unspecified: Secondary | ICD-10-CM | POA: Diagnosis not present

## 2023-07-16 NOTE — Progress Notes (Signed)
 Daily Session Note  Patient Details  Name: [name] MRN: [id number] Date of Birth: 11/29/1960 Referring Provider:   Flowsheet Row Pulmonary Rehab from 06/10/2023 in [facility] Cardiac and Pulmonary Rehab  Referring Provider Annitta Kindler, MD       Encounter Date: 07/16/2023  Check In:  Session Check In - 07/16/23 1215       Check-In   Supervising physician immediately available to respond to emergencies See telemetry face sheet for immediately available ER MD    Location [facility]-Cardiac & Pulmonary Rehab    Staff Present Maud Sorenson, RN, BSN, CCRP;Joseph Hood RCP,RRT,BSRT;Meredith Craven RN,BSN;Maxon Strawberry BS, Exercise Physiologist;Jason Martina Sledge RDN,LDN    Virtual Visit No    Medication changes reported     No    Fall or balance concerns reported    No    Warm-up and Cool-down Performed on first and last piece of equipment    Resistance Training Performed Yes    VAD Patient? No    PAD/SET Patient? No      Pain Assessment   Currently in Pain? No/denies                Social History   Tobacco Use  Smoking Status Former   Current packs/day: 0.00   Average packs/day: 1 pack/day for 40.0 years (40.0 ttl pk-yrs)   Types: Cigarettes   Start date: 03/10/1981   Quit date: 03/10/2021   Years since quitting: 2.3   Passive exposure: Past  Smokeless Tobacco Never  Tobacco Comments   on nicotine  patches    Goals Met:  Proper associated with RPD/PD & O2 Sat Independence with exercise equipment Exercise tolerated well No report of concerns or symptoms today  Goals Unmet:  Not Applicable  Comments: Pt able to follow exercise prescription today without complaint.  Will continue to monitor for progression.    Dr. Firman Hughes is Medical Director for [facility] Rehabilitation.  Dr. Fuad Aleskerov is Medical Director for [facility] Pulmonary Rehabilitation.

## 2023-07-20 ENCOUNTER — Other Ambulatory Visit: Payer: Self-pay

## 2023-07-21 ENCOUNTER — Encounter: Admitting: *Deleted

## 2023-07-21 DIAGNOSIS — J449 Chronic obstructive pulmonary disease, unspecified: Secondary | ICD-10-CM | POA: Diagnosis not present

## 2023-07-21 NOTE — Progress Notes (Signed)
 Daily Session Note  Patient Details  Name: [name] MRN: [id number] Date of Birth: 1960-08-14 Referring Provider:   Flowsheet Row Pulmonary Rehab from 06/10/2023 in [facility] Cardiac and Pulmonary Rehab  Referring Provider Annitta Kindler, MD       Encounter Date: 07/21/2023  Check In:  Session Check In - 07/21/23 0934       Check-In   Supervising physician immediately available to respond to emergencies See telemetry face sheet for immediately available ER MD    Location [facility]-Cardiac & Pulmonary Rehab    Staff Present Maud Sorenson, RN, BSN, CCRP;Margaret Best, MS, Exercise Physiologist;Maxon Conetta BS, Exercise Physiologist;Jason Martina Sledge RDN,LDN    Virtual Visit No    Medication changes reported     No    Fall or balance concerns reported    No    Warm-up and Cool-down Performed on first and last piece of equipment    Resistance Training Performed Yes    VAD Patient? No    PAD/SET Patient? No      Pain Assessment   Currently in Pain? No/denies                Social History   Tobacco Use  Smoking Status Former   Current packs/day: 0.00   Average packs/day: 1 pack/day for 40.0 years (40.0 ttl pk-yrs)   Types: Cigarettes   Start date: 03/10/1981   Quit date: 03/10/2021   Years since quitting: 2.3   Passive exposure: Past  Smokeless Tobacco Never  Tobacco Comments   on nicotine  patches    Goals Met:  Proper associated with RPD/PD & O2 Sat Independence with exercise equipment Exercise tolerated well No report of concerns or symptoms today  Goals Unmet:  Not Applicable  Comments: Pt able to follow exercise prescription today without complaint.  Will continue to monitor for progression.    Dr. Firman Hughes is Medical Director for [facility] Cardiac Rehabilitation.  Dr. Fuad Aleskerov is Medical Director for [facility] Pulmonary Rehabilitation.

## 2023-07-22 ENCOUNTER — Other Ambulatory Visit: Payer: Self-pay | Admitting: Pharmacy Technician

## 2023-07-22 ENCOUNTER — Other Ambulatory Visit (HOSPITAL_COMMUNITY): Payer: Self-pay

## 2023-07-22 ENCOUNTER — Other Ambulatory Visit: Payer: Self-pay

## 2023-07-22 NOTE — Progress Notes (Signed)
 Specialty Pharmacy Refill Coordination Note  [name] is a 63 y.o. female contacted today regarding refills of specialty medication(s) Etanercept  (Enbrel  Mini)   Patient requested Delivery   Delivery date: 07/28/23   Verified address: [address] [place]   Medication will be filled on 07/27/23.

## 2023-07-23 ENCOUNTER — Encounter

## 2023-07-23 ENCOUNTER — Encounter: Payer: Self-pay | Admitting: Pulmonary Disease

## 2023-07-23 ENCOUNTER — Ambulatory Visit: Payer: 59 | Admitting: Pulmonary Disease

## 2023-07-23 ENCOUNTER — Other Ambulatory Visit: Payer: Self-pay

## 2023-07-23 VITALS — BP 110/60 | HR 108 | Temp 97.3°F | Ht 65.7 in | Wt 113.0 lb

## 2023-07-23 DIAGNOSIS — R0609 Other forms of dyspnea: Secondary | ICD-10-CM

## 2023-07-23 DIAGNOSIS — J449 Chronic obstructive pulmonary disease, unspecified: Secondary | ICD-10-CM | POA: Diagnosis not present

## 2023-07-23 NOTE — Progress Notes (Signed)
 Synopsis: Referred in by Lawrance Presume, MD   Subjective:   PATIENT ID: Jenkins Mo GENDER: female DOB: [birth year], MRN: [id number]  Chief Complaint  Patient presents with   Follow-up    SOB. Wheezing. Cough.    HPI Ms. 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 Waymond Hailey, rheumatoid arthritis on Enbrel   presenting today to the pumonary clinic to establish care.   She was last seen by Dr. Waymond Hailey om 02/2022 at which point she was switched to Breztri . Doing well on Breztri  but remains with significant dyspnea.   PFTs 2023 with Stage III COPD with post bronchodilator FEV-1 32% of predicted. A1AT 186 MM.    She is currently on Breztri  and albuterol  as needed. She completed pulmonary rehab and realized that she was really limited by her breathing and is seekin other treatment modalities one of which is zephyr valve which she is really interested in.   Family history: Maternal Uncle with lung cancer.   Social history:  Ex smoker quit in 2023. Smoked 1 ppd for multiple years.   ROS All systems were reviewed and are negative except for the above.  Objective:   There were no vitals filed for this visit.    on RA BMI Readings from Last 3 Encounters:  06/10/23 18.73 kg/m  04/01/23 19.04 kg/m  03/23/23 18.85 kg/m   Wt Readings from Last 3 Encounters:  06/10/23 115 lb (52.2 kg)  04/01/23 116 lb 3.2 oz (52.7 kg)  03/23/23 115 lb (52.2 kg)    Physical Exam GEN: NAD, Healthy Appearing HEENT: Supple Neck, Reactive Pupils, EOMI  CVS: Normal S1, Normal S2, RRR, No murmurs or ES appreciated  Lungs: Poor air movement bilaterally.  Abdomen: Soft, non tender, non distended, + BS  Extremities: Warm and well perfused, No edema  Skin: No suspicious lesions appreciated  Psych: Normal Affect  Ancillary Information   CBC    Component Value Date/Time   WBC 5.2 03/23/2023 1401   RBC 4.74 03/23/2023 1401   HGB  14.3 03/23/2023 1401   HGB 14.5 10/24/2020 1210   HGB 12.8 01/15/2018 1509   HCT 44.2 03/23/2023 1401   HCT 38.3 01/15/2018 1509   PLT 229 03/23/2023 1401   PLT 167 10/24/2020 1210   PLT 311 01/15/2018 1509   MCV 93.2 03/23/2023 1401   MCV 86 01/15/2018 1509   MCH 30.2 03/23/2023 1401   MCHC 32.4 03/23/2023 1401   RDW 13.3 03/23/2023 1401   RDW 14.2 01/15/2018 1509   LYMPHSABS 1,380 10/15/2022 1507   MONOABS 0.5 07/31/2021 1145   EOSABS 88 03/23/2023 1401   BASOSABS 31 03/23/2023 1401   Labs and imaging were reviewed.     Latest Ref Rng & Units 09/04/2021    3:42 PM  PFT Results  FVC-Pre L 1.66   FVC-Predicted Pre % 49   FVC-Post L 1.75   FVC-Predicted Post % 51   Pre FEV1/FVC % % 46   Post FEV1/FCV % % 48   FEV1-Pre L 0.76   FEV1-Predicted Pre % 29   FEV1-Post L 0.84   DLCO uncorrected ml/min/mmHg 10.70   DLCO UNC% % 51   DLCO corrected ml/min/mmHg 10.45   DLCO COR %Predicted % 50   DLVA Predicted % 72   TLC L 6.24   TLC % Predicted % 119   RV % Predicted % 206  Assessment & Plan:  Ms. 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 Waymond Hailey, heumatoid arthritis on Enbrel   presenting today to the pumonary clinic to establish care.   #Stage III COPD Gp B Mmrc 2. CAT 18 A1AT 186 MM.  []  Continue with budesonide -Formoterol -Glyco [Breztri ] 2puffs BID.  []  C/w Albuterol  as needed.  []  Start Ohtuvayre  nebulizer 3mg  BID.  []  Referral to [facility] for eval for Zephyr valve placement. (FEV-1 31%, TLC 119% and RV 209%, CT chest 2024 with extensive biapical emphysema) []  Repeat PFT and obtain Echocardiogram for PH eval. []  LDCT in 02/2024.   I spent 40 minutes caring for this patient today, including preparing to see the patient, obtaining a medical history , reviewing a separately obtained history, performing a medically appropriate examination and/or evaluation, ordering medications, tests, or  procedures, documenting clinical information in the electronic health record, and independently interpreting results (not separately reported/billed) and communicating results to the patient/family/caregiver  Annitta Kindler, MD [facility] 07/23/2023 3:55 PM

## 2023-07-27 ENCOUNTER — Inpatient Hospital Stay: Payer: No Typology Code available for payment source | Admitting: Internal Medicine

## 2023-07-28 ENCOUNTER — Ambulatory Visit (INDEPENDENT_AMBULATORY_CARE_PROVIDER_SITE_OTHER): Admitting: Pulmonary Disease

## 2023-07-28 ENCOUNTER — Encounter

## 2023-07-28 DIAGNOSIS — M542 Cervicalgia: Secondary | ICD-10-CM | POA: Diagnosis not present

## 2023-07-28 DIAGNOSIS — R0609 Other forms of dyspnea: Secondary | ICD-10-CM

## 2023-07-28 DIAGNOSIS — M546 Pain in thoracic spine: Secondary | ICD-10-CM | POA: Diagnosis not present

## 2023-07-28 DIAGNOSIS — M9901 Segmental and somatic dysfunction of cervical region: Secondary | ICD-10-CM | POA: Diagnosis not present

## 2023-07-28 DIAGNOSIS — M9902 Segmental and somatic dysfunction of thoracic region: Secondary | ICD-10-CM | POA: Diagnosis not present

## 2023-07-28 LAB — PULMONARY FUNCTION TEST
DL/VA % pred: 76 %
DL/VA: 3.19 ml/min/mmHg/L
DLCO unc % pred: 47 %
DLCO unc: 9.75 ml/min/mmHg
FEF 25-75 Post: 0.32 L/s
FEF 25-75 Pre: 0.26 L/s
FEF2575-%Change-Post: 22 %
FEF2575-%Pred-Post: 14 %
FEF2575-%Pred-Pre: 11 %
FEV1-%Change-Post: 9 %
FEV1-%Pred-Post: 27 %
FEV1-%Pred-Pre: 24 %
FEV1-Post: 0.7 L
FEV1-Pre: 0.63 L
FEV1FVC-%Change-Post: 2 %
FEV1FVC-%Pred-Pre: 53 %
FEV6-%Change-Post: 7 %
FEV6-%Pred-Post: 49 %
FEV6-%Pred-Pre: 45 %
FEV6-Post: 1.57 L
FEV6-Pre: 1.46 L
FEV6FVC-%Change-Post: 0 %
FEV6FVC-%Pred-Post: 99 %
FEV6FVC-%Pred-Pre: 99 %
FVC-%Change-Post: 7 %
FVC-%Pred-Post: 49 %
FVC-%Pred-Pre: 46 %
FVC-Post: 1.64 L
FVC-Pre: 1.53 L
Post FEV1/FVC ratio: 43 %
Post FEV6/FVC ratio: 96 %
Pre FEV1/FVC ratio: 42 %
Pre FEV6/FVC Ratio: 96 %
RV % pred: 216 %
RV: 4.58 L
TLC % pred: 121 %
TLC: 6.3 L

## 2023-07-28 NOTE — Progress Notes (Signed)
 Full PFT completed today ? ?

## 2023-07-28 NOTE — Patient Instructions (Signed)
 Full PFT completed today ? ?

## 2023-07-30 ENCOUNTER — Other Ambulatory Visit (HOSPITAL_COMMUNITY): Payer: Self-pay

## 2023-07-30 ENCOUNTER — Telehealth: Payer: Self-pay

## 2023-07-30 ENCOUNTER — Encounter

## 2023-07-30 NOTE — Telephone Encounter (Signed)
 Received Ohtuvayre new start paperwork. Completed form and faxed with clinicals and insurance card copy to [employer]   Phone#: [phone] Fax#: [phone]   Chesley Mires, PharmD, MPH, BCPS, CPP Clinical Pharmacist (Rheumatology and Pulmonology)

## 2023-08-04 ENCOUNTER — Encounter

## 2023-08-06 ENCOUNTER — Encounter

## 2023-08-10 NOTE — Progress Notes (Deleted)
 Office Visit Note  Patient: [name]             Date of Birth: 20-Mar-1960           MRN: [id number]             PCP: Lawrance Presume, MD Referring: Lawrance Presume, MD Visit Date: 08/24/2023 Occupation: @GUAROCC @  Subjective:    History of Present Illness: [name] is a 63 y.o. female with history of seropositive rheumatoid arthritis.  Patient remains on  Enbrel  50 mg sq injections once weekly, Otrexup  20 mg sq injections once weekly, and folic acid  2 mg daily   CBC and CMP WNL on 03/23/23.  Order for CBC and CMP released today.   TB gold negative on 10/15/22.  Discussed the importance of holding enbrel  if she develops signs or symptoms of an infection and to resume once the infection has completely cleared.    Activities of Daily Living:  Patient reports morning stiffness for *** {minute/hour:19697}.   Patient {ACTIONS;DENIES/REPORTS:21021675::Denies} nocturnal pain.  Difficulty dressing/grooming: {ACTIONS;DENIES/REPORTS:21021675::Denies} Difficulty climbing stairs: {ACTIONS;DENIES/REPORTS:21021675::Denies} Difficulty getting out of chair: {ACTIONS;DENIES/REPORTS:21021675::Denies} Difficulty using hands for taps, buttons, cutlery, and/or writing: {ACTIONS;DENIES/REPORTS:21021675::Denies}  No Rheumatology ROS completed.   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   Adenomatous colon polyp 03/16/2019   Degenerative joint disease of shoulder region 06/08/2018   Rheumatoid arthritis involving both hands with positive rheumatoid factor (HCC) 06/08/2018   Rheumatoid arthritis  involving both feet with positive rheumatoid factor (HCC) 06/08/2018   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   Status post total hip replacement, 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   Smoker 06/08/2018   Anxiety and depression 06/08/2018   Family history of breast cancer 03/25/2018   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   Rheumatoid arthritis involving multiple sites (HCC) 01/15/2018   Tobacco dependence 01/15/2018   Opioid use agreement exists 01/15/2018   Positive depression screening 01/15/2018   Homeless 01/15/2018    Past Medical History:  Diagnosis Date   Anemia    during pregnancy   Anxiety    Asthma    Bronchitis age 58   Cancer (HCC)    left breast ca   COPD (chronic obstructive pulmonary disease) (HCC)    Depression    Hx of bladder infections    been over 5 years since last one    Malignant neoplasm of upper-outer quadrant of left breast in female, estrogen receptor positive (HCC)    Rheumatoid arthritis (HCC)    unc rheum    Tobacco abuse    Trouble in sleeping     Family History  Problem Relation Age of Onset   Breast cancer Mother 6       metastatic   Cervical cancer Mother    Lung cancer Brother        maternal half brother; d. 31s   Lung cancer Maternal Uncle        d. 94s   Lung cancer Maternal Uncle        d. 59   Cancer Maternal Grandmother        unknown type; dx 90s  Healthy Son    Breast cancer Maternal Great-grandmother        dx unknown age   Past Surgical History:  Procedure Laterality Date   BREAST LUMPECTOMY WITH RADIOACTIVE SEED AND SENTINEL LYMPH NODE BIOPSY Left 08/30/2019   Procedure: LEFT BREAST LUMPECTOMY X 2 WITH RADIOACTIVE SEED AND SENTINEL LYMPH NODE BIOPSY;  Surgeon: Caralyn Chandler, MD;  Location: [facility] OR;  Service: General;  Laterality: Left;  PEC BLOCK   COLONOSCOPY WITH PROPOFOL  N/A 03/15/2019   Procedure: COLONOSCOPY WITH PROPOFOL ;  Surgeon: Luke Salaam, MD;  Location: [facility] ENDOSCOPY;  Service: Gastroenterology;  Laterality: N/A;   COLONOSCOPY WITH  PROPOFOL  N/A 07/29/2022   Procedure: COLONOSCOPY WITH PROPOFOL ;  Surgeon: Luke Salaam, MD;  Location: [facility] ENDOSCOPY;  Service: Gastroenterology;  Laterality: N/A;   HIP ARTHROPLASTY     JOINT REPLACEMENT     MULTIPLE TOOTH EXTRACTIONS     RE-EXCISION OF BREAST LUMPECTOMY Left 10/07/2019   Procedure: RE-EXCISION OF LEFT BREAST INFERIOR MARGIN;  Surgeon: Caralyn Chandler, MD;  Location: [facility];  Service: General;  Laterality: Left;   TOTAL HIP ARTHROPLASTY Left 02/15/2018   Procedure: TOTAL HIP ARTHROPLASTY ANTERIOR APPROACH;  Surgeon: Wendolyn Hamburger, MD;  Location: [facility] ORS;  Service: Orthopedics;  Laterality: Left;   WISDOM TOOTH EXTRACTION     Social History   Social History Narrative   Not on file   Immunization History  Administered Date(s) Administered   Influenza, Seasonal, Injecte, Preservative Fre 01/29/2023   Influenza,inj,Quad PF,6+ Mos 12/18/2020, 03/17/2022   PFIZER(Purple Top)SARS-COV-2 Vaccination 06/27/2019, 07/19/2019   Pneumococcal Conjugate-13 07/14/2018   Tdap 03/25/2018   Zoster Recombinant(Shingrix ) 07/14/2018, 06/17/2022     Objective: Vital Signs: There were no vitals taken for this visit.   Physical Exam Vitals and nursing note reviewed.  Constitutional:      Appearance: She is well-developed.  HENT:     Head: Normocephalic and atraumatic.   Eyes:     Conjunctiva/sclera: Conjunctivae normal.    Cardiovascular:     Rate and Rhythm: Normal rate and regular rhythm.     Heart sounds: Normal heart sounds.  Pulmonary:     Effort: Pulmonary effort is normal.     Breath sounds: Normal breath sounds.  Abdominal:     General: Bowel sounds are normal.     Palpations: Abdomen is soft.   Musculoskeletal:     Cervical back: Normal range of motion.  Lymphadenopathy:     Cervical: No cervical adenopathy.   Skin:    General: Skin is warm and dry.     Capillary Refill: Capillary refill takes less than 2 seconds.   Neurological:     Mental  Status: She is alert and oriented to person, place, and time.   Psychiatric:        Behavior: Behavior normal.      Musculoskeletal Exam: ***  CDAI Exam: CDAI Score: -- Patient Global: --; Provider Global: -- Swollen: --; Tender: -- Joint Exam 08/24/2023   No joint exam has been documented for this visit   There is currently no information documented on the homunculus. Go to the Rheumatology activity and complete the homunculus joint exam.  Investigation: No additional findings.  Imaging: No results found.  Recent Labs: Lab Results  Component Value Date   WBC 5.2 03/23/2023   HGB 14.3 03/23/2023   PLT 229 03/23/2023   NA 141 03/23/2023   K 4.5 03/23/2023   CL 104 03/23/2023   CO2 30 03/23/2023  GLUCOSE 82 03/23/2023   BUN 12 03/23/2023   CREATININE 0.75 03/23/2023   BILITOT 0.7 03/23/2023   ALKPHOS 66 12/14/2020   AST 17 03/23/2023   ALT 17 03/23/2023   PROT 6.6 03/23/2023   ALBUMIN 4.4 12/14/2020   CALCIUM 9.4 03/23/2023   GFRAA 101 07/30/2020   QFTBGOLDPLUS NEGATIVE 10/15/2022    Speciality Comments: No specialty comments available.  Procedures:  No procedures performed Allergies: Trazodone  and nefazodone and Other   Assessment / Plan:     Visit Diagnoses: Rheumatoid arthritis involving multiple sites with positive rheumatoid factor (HCC)  High risk medication use  Other secondary osteoarthritis of both shoulders  Contracture of left elbow  Status post total hip replacement, left  [name]  Other emphysema (HCC)  Solitary pulmonary nodule on lung CT  Smoker  Anxiety and depression  Family history of breast cancer  History of anemia  Orders: No orders of the defined types were placed in this encounter.  No orders of the defined types were placed in this encounter.   Face-to-face time spent with patient was *** minutes. Greater than 50% of time was spent in counseling and coordination of  care.  Follow-Up Instructions: No follow-ups on file.   Romayne Clubs, PA-C  Note - This record has been created using Dragon software.  Chart creation errors have been sought, but may not always  have been located. Such creation errors do not reflect on  the standard of medical care.

## 2023-08-10 NOTE — Telephone Encounter (Signed)
 Received fax from [employer] with summary of benefits. Referral form for [place]  received. Rx will be triaged to [facility].. Once benefits investigation completed, pharmacy will reach out the patient to schedule shipment. If medication is unaffordable, patient will need to express financial hardship to be referred back to [facility] for patient assistance program pre-screening.   Patient ID: [id number] Pharmacy phone: [phone] [facility] Phone#: [phone]

## 2023-08-11 ENCOUNTER — Encounter

## 2023-08-12 ENCOUNTER — Encounter: Payer: Self-pay | Admitting: *Deleted

## 2023-08-12 NOTE — Progress Notes (Signed)
 Pulmonary Individual Treatment Plan  Patient Details  Name: [name] MRN: [id number] Date of Birth: 1961/01/02 Referring Provider:   Flowsheet Row Pulmonary Rehab from 06/10/2023 in [facility] Cardiac and Pulmonary Rehab  Referring Provider Assaker, Marianne Shirts, MD       Initial Encounter Date:  Flowsheet Row Pulmonary Rehab from 06/10/2023 in [facility] Cardiac and Pulmonary Rehab  Date 06/10/23       Visit Diagnosis: No diagnosis found.  Patient's Home Medications on Admission:  Current Outpatient Medications:    albuterol  (VENTOLIN  HFA) 108 (90 Base) MCG/ACT inhaler, TAKE 2 PUFFS BY MOUTH EVERY 6 HOURS AS NEEDED FOR WHEEZE OR SHORTNESS OF BREATH, Disp: 17 each, Rfl: 1   benzonatate  (TESSALON ) 100 MG capsule, Take 1 capsule (100 mg total) by mouth 2 (two) times daily as needed for cough., Disp: 20 capsule, Rfl: 0   budesonide -glycopyrrolate -formoterol  (BREZTRI  AEROSPHERE) 160-9-4.8 MCG/ACT AERO inhaler, Inhale 2 puffs into the lungs 2 (two) times daily., Disp: 10.7 g, Rfl: 11   Calcium Carb-Cholecalciferol  (CALCIUM 1000 + D PO), Take by mouth., Disp: , Rfl:    COLLAGEN PO, Take by mouth daily., Disp: , Rfl:    cyanocobalamin  100 MCG tablet, Take 100 mcg by mouth daily., Disp: , Rfl:    etanercept  (ENBREL  MINI) 50 MG/ML injection, INJECT 50 MG INTO THE SKIN ONCE A WEEK., Disp: 4 mL, Rfl: 2   folic acid  (FOLVITE ) 1 MG tablet, Take 2 tablets (2 mg total) by mouth daily., Disp: 180 tablet, Rfl: 3   hydrOXYzine  (VISTARIL ) 25 MG capsule, Take 1 capsule (25 mg total) by mouth at bedtime., Disp: 30 capsule, Rfl: 3   methotrexate  (RHEUMATREX) 2.5 MG tablet, Take 8 tablets (20 mg total) by mouth once a week. Caution:Chemotherapy. Protect from light., Disp: 32 tablet, Rfl: 2   Methotrexate , PF, (OTREXUP ) 20 MG/0.4ML SOAJ, Inject 20 mg into the skin once a week., Disp: 1.6 mL, Rfl: 2   Multiple Vitamin (MULTIVITAMIN WITH MINERALS) TABS tablet, Take 1 tablet by mouth daily., Disp: , Rfl:    nicotine   (NICODERM CQ  - DOSED IN MG/24 HR) 7 mg/24hr patch, Place 1 patch (7 mg total) onto the skin daily. Please schedule appt with PCP for additional refills., Disp: 28 patch, Rfl: 0   tamoxifen  (NOLVADEX ) 20 MG tablet, Take 1 tablet (20 mg total) by mouth daily., Disp: 90 tablet, Rfl: 1   VITAMIN D  PO, Take by mouth daily., Disp: , Rfl:   Past Medical History: Past Medical History:  Diagnosis Date   Anemia    during pregnancy   Anxiety    Asthma    Bronchitis age 63   Cancer (HCC)    left breast ca   COPD (chronic obstructive pulmonary disease) (HCC)    Depression    Hx of bladder infections    been over 5 years since last one    Malignant neoplasm of upper-outer quadrant of left breast in female, estrogen receptor positive (HCC)    Rheumatoid arthritis (HCC)    unc rheum    Tobacco abuse    Trouble in sleeping     Tobacco Use: Social History   Tobacco Use  Smoking Status Former   Current packs/day: 0.00   Average packs/day: 1 pack/day for 40.0 years (40.0 ttl pk-yrs)   Types: Cigarettes   Start date: 03/10/1981   Quit date: 03/10/2021   Years since quitting: 2.4   Passive exposure: Past  Smokeless Tobacco Never  Tobacco Comments   on nicotine  patches  Labs: Review Flowsheet       Latest Ref Rng & Units 01/15/2018 07/30/2021  Labs for ITP Cardiac and Pulmonary Rehab  Cholestrol 100 - 199 mg/dL 132  440   LDL (calc) 0 - 99 mg/dL 67  84   HDL-C >10 mg/dL 77  71   Trlycerides 0 - 149 mg/dL 272  536      Pulmonary Assessment Scores:  Pulmonary Assessment Scores     Row Name 06/10/23 1117         mMRC Score   mMRC Score 3              UCSD: Self-administered rating of dyspnea associated with activities of daily living (ADLs) 6-point scale (0 = "not at all" to 5 = "maximal or unable to do because of breathlessness")  Scoring Scores range from 0 to 120.  Minimally important difference is 5 units  CAT: CAT can identify the health impairment of COPD patients  and is better correlated with disease progression.  CAT has a scoring range of zero to 40. The CAT score is classified into four groups of low (less than 10), medium (10 - 20), high (21-30) and very high (31-40) based on the impact level of disease on health status. A CAT score over 10 suggests significant symptoms.  A worsening CAT score could be explained by an exacerbation, poor medication adherence, poor inhaler technique, or progression of COPD or comorbid conditions.  CAT MCID is 2 points  mMRC: mMRC (Modified Medical Research Council) Dyspnea Scale is used to assess the degree of baseline functional disability in patients of respiratory disease due to dyspnea. No minimal important difference is established. A decrease in score of 1 point or greater is considered a positive change.   Pulmonary Function Assessment:   Exercise Target Goals: Exercise Program Goal: Individual exercise prescription set using results from initial 6 min walk test and THRR while considering  patient's activity barriers and safety.   Exercise Prescription Goal: Initial exercise prescription builds to 30-45 minutes a day of aerobic activity, 2-3 days per week.  Home exercise guidelines will be given to patient during program as part of exercise prescription that the participant will acknowledge.  Education: Aerobic Exercise: - Group verbal and visual presentation on the components of exercise prescription. Introduces F.I.T.T principle from ACSM for exercise prescriptions.  Reviews F.I.T.T. principles of aerobic exercise including progression. Written material given at graduation.   Education: Resistance Exercise: - Group verbal and visual presentation on the components of exercise prescription. Introduces F.I.T.T principle from ACSM for exercise prescriptions  Reviews F.I.T.T. principles of resistance exercise including progression. Written material given at graduation.    Education: Exercise & Equipment  Safety: - Individual verbal instruction and demonstration of equipment use and safety with use of the equipment. Flowsheet Row Pulmonary Rehab from 07/16/2023 in [facility] Cardiac and Pulmonary Rehab  Date 06/10/23  Educator MB  Instruction Review Code 1- Verbalizes Understanding       Education: Exercise Physiology & General Exercise Guidelines: - Group verbal and written instruction with models to review the exercise physiology of the cardiovascular system and associated critical values. Provides general exercise guidelines with specific guidelines to those with heart or lung disease.    Education: Flexibility, Balance, Mind/Body Relaxation: - Group verbal and visual presentation with interactive activity on the components of exercise prescription. Introduces F.I.T.T principle from ACSM for exercise prescriptions. Reviews F.I.T.T. principles of flexibility and balance exercise training including progression. Also discusses the mind  body connection.  Reviews various relaxation techniques to help reduce and manage stress (i.e. Deep breathing, progressive muscle relaxation, and visualization). Balance handout provided to take home. Written material given at graduation.   Activity Barriers & Risk Stratification:  Activity Barriers & Cardiac Risk Stratification - 06/10/23 1108       Activity Barriers & Cardiac Risk Stratification   Activity Barriers Arthritis;Joint Problems   R shoulder, and RA            6 Minute Walk:  6 Minute Walk     Row Name 06/10/23 1103         6 Minute Walk   Phase Initial     Distance 1305 feet     Walk Time 6 minutes     # of Rest Breaks 0     MPH 2.47     METS 3.91     RPE 9     Perceived Dyspnea  3     VO2 Peak 13.69     Symptoms No     Resting HR 86 bpm     Resting BP 104/70     Resting Oxygen Saturation  92 %     Exercise Oxygen Saturation  during 6 min walk 87 %     Max Ex. HR 121 bpm     Max Ex. BP 132/62     2 Minute Post BP 102/64        Interval HR   1 Minute HR 103     2 Minute HR 114     3 Minute HR 116     4 Minute HR 114     5 Minute HR 117     6 Minute HR 121     2 Minute Post HR 107     Interval Heart Rate? Yes       Interval Oxygen   Interval Oxygen? Yes     Baseline Oxygen Saturation % 92 %     1 Minute Oxygen Saturation % 91 %     1 Minute Liters of Oxygen 0 L     2 Minute Oxygen Saturation % 90 %     2 Minute Liters of Oxygen 0 L     3 Minute Oxygen Saturation % 88 %     3 Minute Liters of Oxygen 0 L     4 Minute Oxygen Saturation % 88 %     4 Minute Liters of Oxygen 0 L     5 Minute Oxygen Saturation % 87 %     5 Minute Liters of Oxygen 0 L     6 Minute Oxygen Saturation % 88 %     6 Minute Liters of Oxygen 0 L     2 Minute Post Oxygen Saturation % 94 %     2 Minute Post Liters of Oxygen 0 L             Oxygen Initial Assessment:  Oxygen Initial Assessment - 05/26/23 1405       Home Oxygen   Home Oxygen Device None    Sleep Oxygen Prescription None    Home Exercise Oxygen Prescription None    Home Resting Oxygen Prescription None    Compliance with Home Oxygen Use Yes      Intervention   Short Term Goals To learn and understand importance of monitoring SPO2 with pulse oximeter and demonstrate accurate use of the pulse oximeter.;To learn and understand importance of maintaining oxygen saturations>88%;To learn  and demonstrate proper pursed lip breathing techniques or other breathing techniques. ;To learn and demonstrate proper use of respiratory medications    Long  Term Goals Maintenance of O2 saturations>88%;Verbalizes importance of monitoring SPO2 with pulse oximeter and return demonstration;Exhibits proper breathing techniques, such as pursed lip breathing or other method taught during program session;Compliance with respiratory medication;Demonstrates proper use of MDI's             Oxygen Re-Evaluation:  Oxygen Re-Evaluation     Row Name 06/16/23 0925              Program Oxygen Prescription   Program Oxygen Prescription None         Home Oxygen   Home Oxygen Device None       Home Exercise Oxygen Prescription None       Home Resting Oxygen Prescription None       Compliance with Home Oxygen Use Yes         Goals/Expected Outcomes   Short Term Goals To learn and demonstrate proper pursed lip breathing techniques or other breathing techniques.        Long  Term Goals Exhibits proper breathing techniques, such as pursed lip breathing or other method taught during program session       Comments Reviewed PLB technique with pt.  Talked about how it works and it's importance in maintaining their exercise saturations.       Goals/Expected Outcomes Short: Become more profiecient at using PLB. Long: Become independent at using PLB.                Oxygen Discharge (Final Oxygen Re-Evaluation):  Oxygen Re-Evaluation - 06/16/23 0925       Program Oxygen Prescription   Program Oxygen Prescription None      Home Oxygen   Home Oxygen Device None    Home Exercise Oxygen Prescription None    Home Resting Oxygen Prescription None    Compliance with Home Oxygen Use Yes      Goals/Expected Outcomes   Short Term Goals To learn and demonstrate proper pursed lip breathing techniques or other breathing techniques.     Long  Term Goals Exhibits proper breathing techniques, such as pursed lip breathing or other method taught during program session    Comments Reviewed PLB technique with pt.  Talked about how it works and it's importance in maintaining their exercise saturations.    Goals/Expected Outcomes Short: Become more profiecient at using PLB. Long: Become independent at using PLB.             Initial Exercise Prescription:  Initial Exercise Prescription - 06/10/23 1100       Date of Initial Exercise RX and Referring Provider   Date 06/10/23    Referring Provider Assaker, Marianne Shirts, MD      Oxygen   Maintain Oxygen Saturation 88% or higher       Treadmill   MPH 2.4   Try   Grade 0    Minutes 15    METs 2.84      REL-XR   Level 3    Speed 50    Minutes 15    METs 3.91      T5 Nustep   Level 3    SPM 80    Minutes 15    METs 3.91      Track   Laps 34    Minutes 15    METs 2.85      Prescription Details  Frequency (times per week) 2    Duration Progress to 30 minutes of continuous aerobic without signs/symptoms of physical distress      Intensity   THRR 40-80% of Max Heartrate 114-142    Ratings of Perceived Exertion 11-13    Perceived Dyspnea 0-4      Progression   Progression Continue to progress workloads to maintain intensity without signs/symptoms of physical distress.      Resistance Training   Training Prescription Yes    Weight 3 lb    Reps 10-15             Perform Capillary Blood Glucose checks as needed.  Exercise Prescription Changes:   Exercise Prescription Changes     Row Name 06/10/23 1100 07/01/23 1400 07/15/23 1100 07/27/23 1400       Response to Exercise   Blood Pressure (Admit) 104/70 108/58 118/62 120/70    Blood Pressure (Exercise) 132/62 132/72 134/64 126/68    Blood Pressure (Exit) 102/64 120/70 112/60 100/60    Heart Rate (Admit) 86 bpm 82 bpm 86 bpm 94 bpm    Heart Rate (Exercise) 121 bpm 125 bpm 130 bpm 122 bpm    Heart Rate (Exit) 107 bpm 100 bpm 107 bpm 97 bpm    Oxygen Saturation (Admit) 92 % 94 % 94 % 92 %    Oxygen Saturation (Exercise) 87 % 89 % 90 % 87 %    Oxygen Saturation (Exit) 94 % 92 % 91 % 91 %    Rating of Perceived Exertion (Exercise) 9 13 13 13     Perceived Dyspnea (Exercise) 3 3 3 3     Symptoms none none none none    Comments results First 2 weeks of exercise -- --    Duration Progress to 30 minutes of  aerobic without signs/symptoms of physical distress Progress to 30 minutes of  aerobic without signs/symptoms of physical distress Progress to 30 minutes of  aerobic without signs/symptoms of physical distress Progress to 30 minutes of   aerobic without signs/symptoms of physical distress    Intensity THRR New THRR unchanged THRR unchanged THRR unchanged      Progression   Progression Continue to progress workloads to maintain intensity without signs/symptoms of physical distress. Continue to progress workloads to maintain intensity without signs/symptoms of physical distress. Continue to progress workloads to maintain intensity without signs/symptoms of physical distress. Continue to progress workloads to maintain intensity without signs/symptoms of physical distress.    Average METs 3.91 3.3 2.98 3.1      Resistance Training   Training Prescription -- Yes Yes Yes    Weight -- 3 lb 3 lb 3 lb    Reps -- 10-15 10-15 10-15      Interval Training   Interval Training -- No No No      Oxygen   Oxygen -- Continuous -- --      Treadmill   MPH -- 1.7 -- --    Grade -- 0 -- --    Minutes -- 15 -- --    METs -- 2.3 -- --      NuStep   Level -- 3 3 --    Minutes -- 15 15 --    METs -- 3.4 3.7 --      REL-XR   Level -- 1 1 3     Minutes -- 15 15 15     METs -- 4.8 -- 4      Rower   Level -- -- --  1    Watts -- -- -- 15    Minutes -- -- -- 15    METs -- -- -- 4.54      Track   Laps -- 45 37 27    Minutes -- 15 15 15     METs -- 3.45 3.01 2.47      Oxygen   Maintain Oxygen Saturation -- 88% or higher 88% or higher 88% or higher             Exercise Comments:   Exercise Goals and Review:   Exercise Goals     Row Name 06/10/23 1116             Exercise Goals   Increase Physical Activity Yes       Intervention Provide advice, education, support and counseling about physical activity/exercise needs.;Develop an individualized exercise prescription for aerobic and resistive training based on initial evaluation findings, risk stratification, comorbidities and participant's personal goals.       Expected Outcomes Short Term: Attend rehab on a regular basis to increase amount of physical activity.;Long  Term: Add in home exercise to make exercise part of routine and to increase amount of physical activity.;Long Term: Exercising regularly at least 3-5 days a week.       Increase Strength and Stamina Yes       Intervention Provide advice, education, support and counseling about physical activity/exercise needs.;Develop an individualized exercise prescription for aerobic and resistive training based on initial evaluation findings, risk stratification, comorbidities and participant's personal goals.       Expected Outcomes Short Term: Increase workloads from initial exercise prescription for resistance, speed, and METs.;Short Term: Perform resistance training exercises routinely during rehab and add in resistance training at home;Long Term: Improve cardiorespiratory fitness, muscular endurance and strength as measured by increased METs and functional capacity ( )       Able to understand and use rate of perceived exertion (RPE) scale Yes       Intervention Provide education and explanation on how to use RPE scale       Expected Outcomes Short Term: Able to use RPE daily in rehab to express subjective intensity level;Long Term:  Able to use RPE to guide intensity level when exercising independently       Able to understand and use Dyspnea scale Yes       Intervention Provide education and explanation on how to use Dyspnea scale       Expected Outcomes Short Term: Able to use Dyspnea scale daily in rehab to express subjective sense of shortness of breath during exertion;Long Term: Able to use Dyspnea scale to guide intensity level when exercising independently       Knowledge and understanding of Target Heart Rate Range (THRR) Yes       Intervention Provide education and explanation of THRR including how the numbers were predicted and where they are located for reference       Expected Outcomes Short Term: Able to state/look up THRR;Short Term: Able to use daily as guideline for intensity in rehab;Long  Term: Able to use THRR to govern intensity when exercising independently       Able to check pulse independently Yes       Intervention Provide education and demonstration on how to check pulse in carotid and radial arteries.;Review the importance of being able to check your own pulse for safety during independent exercise       Expected Outcomes Short Term: Able to explain  why pulse checking is important during independent exercise;Long Term: Able to check pulse independently and accurately       Understanding of Exercise Prescription Yes       Intervention Provide education, explanation, and written materials on patient's individual exercise prescription       Expected Outcomes Short Term: Able to explain program exercise prescription;Long Term: Able to explain home exercise prescription to exercise independently                Exercise Goals Re-Evaluation :  Exercise Goals Re-Evaluation     Row Name 06/16/23 1610 07/01/23 1405 07/15/23 1132 07/27/23 1421       Exercise Goal Re-Evaluation   Exercise Goals Review Able to understand and use rate of perceived exertion (RPE) scale;Able to understand and use Dyspnea scale;Knowledge and understanding of Target Heart Rate Range (THRR);Understanding of Exercise Prescription Increase Physical Activity;Increase Strength and Stamina;Understanding of Exercise Prescription Increase Physical Activity;Increase Strength and Stamina;Understanding of Exercise Prescription Increase Physical Activity;Increase Strength and Stamina;Understanding of Exercise Prescription    Comments Reviewed RPE and dyspnea scale, THR and program prescription with pt today.  Pt voiced understanding and was given a copy of goals to take home. Gladie is off to a good start in the program. She was able to attend her first few sessions in the program during the time of this review. She used the treadmill at a speed of 1. and no incline, and was able to walk 45 laps in 15 minutes. We  will continue to monitor her progress in the program. Taneshia continues to do well in the program. She has maintained her workload on the T4 nustep at level 3, and on the XR at level 1. She was able to walk 37 laps on the track in 15 minutes. We will continue to monitor her progress in the program. Falesha is doing well in rehab. She was recently able to add the rowing machine to her exercise prescription at level 1. She was also able to increase from level 1 to 3 on the XR. We will continue to monitor her progress in the program.    Expected Outcomes Short: Use RPE daily to regulate intensity. Long: Follow program prescription in THR. Short: Continue to follow exercise prescription. Long: Continue exercise to improve strength and stamina. Short: Continue to follow exercise prescription. Long: Continue exercise to improve strength and stamina. Short: Continue to follow exercise prescription. Long: Continue exercise to improve strength and stamina.             Discharge Exercise Prescription (Final Exercise Prescription Changes):  Exercise Prescription Changes - 07/27/23 1400       Response to Exercise   Blood Pressure (Admit) 120/70    Blood Pressure (Exercise) 126/68    Blood Pressure (Exit) 100/60    Heart Rate (Admit) 94 bpm    Heart Rate (Exercise) 122 bpm    Heart Rate (Exit) 97 bpm    Oxygen Saturation (Admit) 92 %    Oxygen Saturation (Exercise) 87 %    Oxygen Saturation (Exit) 91 %    Rating of Perceived Exertion (Exercise) 13    Perceived Dyspnea (Exercise) 3    Symptoms none    Duration Progress to 30 minutes of  aerobic without signs/symptoms of physical distress    Intensity THRR unchanged      Progression   Progression Continue to progress workloads to maintain intensity without signs/symptoms of physical distress.    Average METs 3.1  Resistance Training   Training Prescription Yes    Weight 3 lb    Reps 10-15      Interval Training   Interval Training No       REL-XR   Level 3    Minutes 15    METs 4      Rower   Level 1    Watts 15    Minutes 15    METs 4.54      Track   Laps 27    Minutes 15    METs 2.47      Oxygen   Maintain Oxygen Saturation 88% or higher             Nutrition:  Target Goals: Understanding of nutrition guidelines, daily intake of sodium 1500mg , cholesterol 200mg , calories 30% from fat and 7% or less from saturated fats, daily to have 5 or more servings of fruits and vegetables.  Education: All About Nutrition: -Group instruction provided by verbal, written material, interactive activities, discussions, models, and posters to present general guidelines for heart healthy nutrition including fat, fiber, MyPlate, the role of sodium in heart healthy nutrition, utilization of the nutrition label, and utilization of this knowledge for meal planning. Follow up email sent as well. Written material given at graduation.   Biometrics:  Pre Biometrics - 06/10/23 1116       Pre Biometrics   Height 5' 5.7" (1.669 m)    Weight 115 lb (52.2 kg)    Waist Circumference 28.5 inches    Hip Circumference 36.3 inches    Waist to Hip Ratio 0.79 %    BMI (Calculated) 18.73    Single Leg Stand 15 seconds              Nutrition Therapy Plan and Nutrition Goals:  Nutrition Therapy & Goals - 06/16/23 1149       Nutrition Therapy   Diet Mediterranean    Protein (specify units) 75-90    Fiber 25 grams    Whole Grain Foods 3 servings    Saturated Fats 15 max. grams    Fruits and Vegetables 5 servings/day    Sodium 2 grams      Personal Nutrition Goals   Nutrition Goal Eat 15-30gProtein and 30-60gCarbs at each meal.    Personal Goal #2 Reduce saturated fat, less than 12g per day. Replace bad fats for more heart healthy fats.    Comments Patient drinking mostly water , sometimes a carbonated water  or ginger ale to settle her stomach. She eat 3 times per day, sometimes a snack or two. She knows she needs protein and  tries to include one at each meals and sometimes her snacks have one too. She has been drinking a [employer] protein drinks (340kcal, 6gFat, 45gCHO, 38gSugar, 30gPro). We talked about the label, educated on how to read labels while also recommending a low sugar protein shake to try as well. She likes the sweetness of the [employer] shake. Overall, it will be fine, just try not to drink it with high carb meals or snacks. Reviewed mediterranean diet handout. Provided balanced plates sample handout, discussed calorie density and ways to help increase calories without increasing quantity. Explained her increased protein and calorie needs. She understands and will continue to prioritize protein and calories while choosing whole foods and eating smaller more frequent meals throughout the day.      Intervention Plan   Intervention Prescribe, educate and counsel regarding individualized specific dietary modifications aiming towards targeted  core components such as weight, hypertension, lipid management, diabetes, heart failure and other comorbidities.;Nutrition handout(s) given to patient.    Expected Outcomes Short Term Goal: Understand basic principles of dietary content, such as calories, fat, sodium, cholesterol and nutrients.;Short Term Goal: A plan has been developed with personal nutrition goals set during dietitian appointment.;Long Term Goal: Adherence to prescribed nutrition plan.             Nutrition Assessments:  MEDIFICTS Score Key: >=70 Need to make dietary changes  40-70 Heart Healthy Diet <= 40 Therapeutic Level Cholesterol Diet   Picture Your Plate Scores: <16 Unhealthy dietary pattern with much room for improvement. 41-50 Dietary pattern unlikely to meet recommendations for good health and room for improvement. 51-60 More healthful dietary pattern, with some room for improvement.  >60 Healthy dietary pattern, although there may be some specific behaviors that could be  improved.   Nutrition Goals Re-Evaluation:   Nutrition Goals Discharge (Final Nutrition Goals Re-Evaluation):   Psychosocial: Target Goals: Acknowledge presence or absence of significant depression and/or stress, maximize coping skills, provide positive support system. Participant is able to verbalize types and ability to use techniques and skills needed for reducing stress and depression.   Education: Stress, Anxiety, and Depression - Group verbal and visual presentation to define topics covered.  Reviews how body is impacted by stress, anxiety, and depression.  Also discusses healthy ways to reduce stress and to treat/manage anxiety and depression.  Written material given at graduation. Flowsheet Row Pulmonary Rehab from 07/16/2023 in [facility] Cardiac and Pulmonary Rehab  Date 07/16/23  Educator [facility]  Instruction Review Code 1- [employer] Understanding       Education: Sleep Hygiene -Provides group verbal and written instruction about how sleep can affect your health.  Define sleep hygiene, discuss sleep cycles and impact of sleep habits. Review good sleep hygiene tips.    Initial Review & Psychosocial Screening:  Initial Psych Review & Screening - 05/26/23 1409       Initial Review   Current issues with None Identified      Family Dynamics   Good Support System? Yes      Barriers   Psychosocial barriers to participate in program There are no identifiable barriers or psychosocial needs.      Screening Interventions   Interventions Provide feedback about the scores to participant;To provide support and resources with identified psychosocial needs;Encouraged to exercise    Expected Outcomes Short Term goal: Utilizing psychosocial counselor, staff and physician to assist with identification of specific Stressors or current issues interfering with healing process. Setting desired goal for each stressor or current issue identified.;Long Term Goal: Stressors or current issues are  controlled or eliminated.;Short Term goal: Identification and review with participant of any Quality of Life or Depression concerns found by scoring the questionnaire.;Long Term goal: The participant improves quality of Life and PHQ9 Scores as seen by post scores and/or verbalization of changes             Quality of Life Scores:  Scores of 19 and below usually indicate a poorer quality of life in these areas.  A difference of  2-3 points is a clinically meaningful difference.  A difference of 2-3 points in the total score of the Quality of Life Index has been associated with significant improvement in overall quality of life, self-image, physical symptoms, and general health in studies assessing change in quality of life.  PHQ-9: Review Flowsheet  More data exists  06/10/2023 01/30/2023 09/02/2022 06/17/2022 03/17/2022  Depression screen PHQ 2/9  Decreased Interest 0 1 1 1 1   Down, Depressed, Hopeless 1 0 0 0 0  PHQ - 2 Score 1 1 1 1 1   Altered sleeping 2 - - 2 3  Tired, decreased energy 2 - - 1 2  Change in appetite 0 - - 0 1  Feeling bad or failure about yourself  0 - - 0 0  Trouble concentrating 1 - - 0 1  Moving slowly or fidgety/restless 1 - - 1 1  Suicidal thoughts 0 - - 0 0  PHQ-9 Score 7 - - 5 9  Difficult doing work/chores Somewhat difficult - - - -   Interpretation of Total Score  Total Score Depression Severity:  1-4 = Minimal depression, 5-9 = Mild depression, 10-14 = Moderate depression, 15-19 = Moderately severe depression, 20-27 = Severe depression   Psychosocial Evaluation and Intervention:  Psychosocial Evaluation - 05/26/23 1409       Psychosocial Evaluation & Interventions   Interventions Encouraged to exercise with the program and follow exercise prescription    Comments Mr. Craigo is coming to pulmonary rehab with COPD. During the initial phone call, she was under a time constraint and was not able to go into detail. She enjoys doing aquatic exercises when  she can and is currently doing physical therapy for her shoulder. She wants to attend the program to work on her breathing and stamina.    Expected Outcomes Short: attend pulmonary rehab for education and exercise Long: develop and maintain positive self care habits.    Continue Psychosocial Services  Follow up required by staff             Psychosocial Re-Evaluation:   Psychosocial Discharge (Final Psychosocial Re-Evaluation):   Education: Education Goals: Education classes will be provided on a weekly basis, covering required topics. Participant will state understanding/return demonstration of topics presented.  Learning Barriers/Preferences:  Learning Barriers/Preferences - 05/26/23 1408       Learning Barriers/Preferences   Learning Barriers None    Learning Preferences Individual Instruction             General Pulmonary Education Topics:  Infection Prevention: - Provides verbal and written material to individual with discussion of infection control including proper hand washing and proper equipment cleaning during exercise session. Flowsheet Row Pulmonary Rehab from 07/16/2023 in [facility] Cardiac and Pulmonary Rehab  Date 06/10/23  Educator MB  Instruction Review Code 1- Verbalizes Understanding       Falls Prevention: - Provides verbal and written material to individual with discussion of falls prevention and safety. Flowsheet Row Pulmonary Rehab from 07/16/2023 in [facility] Cardiac and Pulmonary Rehab  Date 06/10/23  Educator MB  Instruction Review Code 1- Verbalizes Understanding       Chronic Lung Disease Review: - Group verbal instruction with posters, models, PowerPoint presentations and videos,  to review new updates, new respiratory medications, new advancements in procedures and treatments. Providing information on websites and "800" numbers for continued self-education. Includes information about supplement oxygen, available portable oxygen systems,  continuous and intermittent flow rates, oxygen safety, concentrators, and Medicare reimbursement for oxygen. Explanation of Pulmonary Drugs, including class, frequency, complications, importance of spacers, rinsing mouth after steroid MDI's, and proper cleaning methods for nebulizers. Review of basic lung anatomy and physiology related to function, structure, and complications of lung disease. Review of risk factors. Discussion about methods for diagnosing sleep apnea and types of masks and machines for OSA.  Includes a review of the use of types of environmental controls: home humidity, furnaces, filters, dust mite/pet prevention, HEPA vacuums. Discussion about weather changes, air quality and the benefits of nasal washing. Instruction on Warning signs, infection symptoms, calling MD promptly, preventive modes, and value of vaccinations. Review of effective airway clearance, coughing and/or vibration techniques. Emphasizing that all should Create an Action Plan. Written material given at graduation.   AED/CPR: - Group verbal and written instruction with the use of models to demonstrate the basic use of the AED with the basic ABC's of resuscitation.    Anatomy and Cardiac Procedures: - Group verbal and visual presentation and models provide information about basic cardiac anatomy and function. Reviews the testing methods done to diagnose heart disease and the outcomes of the test results. Describes the treatment choices: Medical Management, Angioplasty, or Coronary Bypass Surgery for treating various heart conditions including Myocardial Infarction, Angina, Valve Disease, and Cardiac Arrhythmias.  Written material given at graduation.   Medication Safety: - Group verbal and visual instruction to review commonly prescribed medications for heart and lung disease. Reviews the medication, class of the drug, and side effects. Includes the steps to properly store meds and maintain the prescription regimen.   Written material given at graduation.   Other: -Provides group and verbal instruction on various topics (see comments)   Knowledge Questionnaire Score:    Core Components/Risk Factors/Patient Goals at Admission:  Personal Goals and Risk Factors at Admission - 06/10/23 1117       Core Components/Risk Factors/Patient Goals on Admission    Weight Management Yes;Weight Maintenance    Intervention Weight Management: Develop a combined nutrition and exercise program designed to reach desired caloric intake, while maintaining appropriate intake of nutrient and fiber, sodium and fats, and appropriate energy expenditure required for the weight goal.;Weight Management: Provide education and appropriate resources to help participant work on and attain dietary goals.;Weight Management/Obesity: Establish reasonable short term and long term weight goals.    Admit Weight 115 lb (52.2 kg)    Goal Weight: Short Term 115 lb (52.2 kg)    Goal Weight: Long Term 115 lb (52.2 kg)    Expected Outcomes Short Term: Continue to assess and modify interventions until short term weight is achieved;Long Term: Adherence to nutrition and physical activity/exercise program aimed toward attainment of established weight goal;Weight Maintenance: Understanding of the daily nutrition guidelines, which includes 25-35% calories from fat, 7% or less cal from saturated fats, less than 200mg  cholesterol, less than 1.5gm of sodium, & 5 or more servings of fruits and vegetables daily;Understanding recommendations for meals to include 15-35% energy as protein, 25-35% energy from fat, 35-60% energy from carbohydrates, less than 200mg  of dietary cholesterol, 20-35 gm of total fiber daily;Understanding of distribution of calorie intake throughout the day with the consumption of 4-5 meals/snacks    Improve shortness of breath with ADL's Yes    Intervention Provide education, individualized exercise plan and daily activity instruction to help  decrease symptoms of SOB with activities of daily living.    Expected Outcomes Short Term: Improve cardiorespiratory fitness to achieve a reduction of symptoms when performing ADLs;Long Term: Be able to perform more ADLs without symptoms or delay the onset of symptoms             Education:Diabetes - Individual verbal and written instruction to review signs/symptoms of diabetes, desired ranges of glucose level fasting, after meals and with exercise. Acknowledge that pre and post exercise glucose checks will be done for  3 sessions at entry of program.   Know Your Numbers and Heart Failure: - Group verbal and visual instruction to discuss disease risk factors for cardiac and pulmonary disease and treatment options.  Reviews associated critical values for Overweight/Obesity, Hypertension, Cholesterol, and Diabetes.  Discusses basics of heart failure: signs/symptoms and treatments.  Introduces Heart Failure Zone chart for action plan for heart failure.  Written material given at graduation. Flowsheet Row Pulmonary Rehab from 07/16/2023 in [facility] Cardiac and Pulmonary Rehab  Date 07/02/23  Educator SB  Instruction Review Code 1- Verbalizes Understanding       Core Components/Risk Factors/Patient Goals Review:    Core Components/Risk Factors/Patient Goals at Discharge (Final Review):    ITP Comments:  ITP Comments     Row Name 05/26/23 1414 06/10/23 1102 06/16/23 0924 06/17/23 0928 07/15/23 1333   ITP Comments Initial phone call completed. Diagnosis can be found in [facility] 1/22. EP Orientation scheduled for Thursday 3/27 at 2pm. Completed and gym orientation. Initial ITP created and sent for review to Dr. Faud Aleskerov, Medical Director. First full day of exercise!  Patient was oriented to gym and equipment including functions, settings, policies, and procedures.  Patient's individual exercise prescription and treatment plan were reviewed.  All starting workloads were established based on  the results of the 6 minute walk test done at initial orientation visit.  The plan for exercise progression was also introduced and progression will be customized based on patient's performance and goals. 30 Day review completed. Medical Director ITP review done, changes made as directed, and signed approval by Medical Director.    new to program 30 Day review completed. Medical Director ITP review done, changes made as directed, and signed approval by Medical Director.    Row Name 08/12/23 1012           ITP Comments 30 Day review completed. Medical Director ITP review done, changes made as directed, and signed approval by Medical Director.                Comments:

## 2023-08-13 ENCOUNTER — Encounter

## 2023-08-18 ENCOUNTER — Encounter: Attending: Pulmonary Disease

## 2023-08-18 DIAGNOSIS — J449 Chronic obstructive pulmonary disease, unspecified: Secondary | ICD-10-CM | POA: Insufficient documentation

## 2023-08-20 ENCOUNTER — Other Ambulatory Visit: Payer: Self-pay

## 2023-08-20 ENCOUNTER — Other Ambulatory Visit (HOSPITAL_COMMUNITY): Payer: Self-pay

## 2023-08-20 ENCOUNTER — Encounter

## 2023-08-20 ENCOUNTER — Other Ambulatory Visit: Payer: Self-pay | Admitting: Physician Assistant

## 2023-08-20 ENCOUNTER — Other Ambulatory Visit: Payer: Self-pay | Admitting: Pharmacy Technician

## 2023-08-20 ENCOUNTER — Telehealth: Payer: Self-pay

## 2023-08-20 MED ORDER — ENBREL MINI 50 MG/ML ~~LOC~~ SOCT
50.0000 mg | SUBCUTANEOUS | 0 refills | Status: DC
  Filled 2023-08-20: qty 4, 28d supply, fill #0

## 2023-08-20 NOTE — Telephone Encounter (Signed)
 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

## 2023-08-20 NOTE — Progress Notes (Signed)
 Clinical Intervention Note  Clinical Intervention Notes: Patient reported starting Breztri . No DDIs identified with Enbrel .   Clinical Intervention Outcomes: Prevention of an adverse drug event   Advertising account planner

## 2023-08-20 NOTE — Progress Notes (Signed)
 Specialty Pharmacy Refill Coordination Note  [name] is a 63 y.o. female contacted today regarding refills of specialty medication(s) Etanercept  (Enbrel  Mini)   Patient requested Delivery   Delivery date: 08/25/23   Verified address: [address] [name]  [address]   Medication will be filled on 08/24/23.  This fill date is pending response to refill request from provider. Left Patient Voicemail if they have not received fill by intended date they must follow up with pharmacy.

## 2023-08-20 NOTE — Telephone Encounter (Signed)
 Copied from CRM [id number]. Topic: Clinical - Medical Advice >> Aug 20, 2023 12:26 PM [name] wrote: Reason for CRM: Patient is calling to inform provider that she will not be attending pulmonary rehab she will going to aquatics instead - stating it is much better for her joints.

## 2023-08-21 ENCOUNTER — Inpatient Hospital Stay: Attending: Internal Medicine | Admitting: Nurse Practitioner

## 2023-08-24 ENCOUNTER — Telehealth: Payer: Self-pay | Admitting: Rheumatology

## 2023-08-24 ENCOUNTER — Ambulatory Visit: Payer: 59 | Admitting: Physician Assistant

## 2023-08-24 ENCOUNTER — Encounter: Payer: Self-pay | Admitting: *Deleted

## 2023-08-24 DIAGNOSIS — Z803 Family history of malignant neoplasm of breast: Secondary | ICD-10-CM

## 2023-08-24 DIAGNOSIS — M24522 Contracture, left elbow: Secondary | ICD-10-CM

## 2023-08-24 DIAGNOSIS — Z862 Personal history of diseases of the blood and blood-forming organs and certain disorders involving the immune mechanism: Secondary | ICD-10-CM

## 2023-08-24 DIAGNOSIS — R911 Solitary pulmonary nodule: Secondary | ICD-10-CM

## 2023-08-24 DIAGNOSIS — Z96642 Presence of left artificial hip joint: Secondary | ICD-10-CM

## 2023-08-24 DIAGNOSIS — Z79899 Other long term (current) drug therapy: Secondary | ICD-10-CM

## 2023-08-24 DIAGNOSIS — F17200 Nicotine dependence, unspecified, uncomplicated: Secondary | ICD-10-CM

## 2023-08-24 DIAGNOSIS — F419 Anxiety disorder, unspecified: Secondary | ICD-10-CM

## 2023-08-24 DIAGNOSIS — J438 Other emphysema: Secondary | ICD-10-CM

## 2023-08-24 DIAGNOSIS — J449 Chronic obstructive pulmonary disease, unspecified: Secondary | ICD-10-CM

## 2023-08-24 DIAGNOSIS — D0512 Intraductal carcinoma in situ of left breast: Secondary | ICD-10-CM

## 2023-08-24 DIAGNOSIS — M19211 Secondary osteoarthritis, right shoulder: Secondary | ICD-10-CM

## 2023-08-24 DIAGNOSIS — M0579 Rheumatoid arthritis with rheumatoid factor of multiple sites without organ or systems involvement: Secondary | ICD-10-CM

## 2023-08-24 NOTE — Progress Notes (Signed)
 Early Discharge Summary   [name] DOB: [birth year]   [name] requested to be discharged early today from pulmonary rehab with 10 sessions completed. Details of the patient's exercise prescription and what She needs to do in order to continue the prescription and progress were discussed with patient.  Patient was given a copy of prescription and goals.  Patient verbalized understanding. [name] plans to continue to exercise by doing water  aerobics.   6 Minute Walk     Row Name 06/10/23 1103         6 Minute Walk   Phase Initial     Distance 1305 feet     Walk Time 6 minutes     # of Rest Breaks 0     MPH 2.47     METS 3.91     RPE 9     Perceived Dyspnea  3     VO2 Peak 13.69     Symptoms No     Resting HR 86 bpm     Resting BP 104/70     Resting Oxygen Saturation  92 %     Exercise Oxygen Saturation  during 6 min walk 87 %     Max Ex. HR 121 bpm     Max Ex. BP 132/62     2 Minute Post BP 102/64       Interval HR   1 Minute HR 103     2 Minute HR 114     3 Minute HR 116     4 Minute HR 114     5 Minute HR 117     6 Minute HR 121     2 Minute Post HR 107     Interval Heart Rate? Yes       Interval Oxygen   Interval Oxygen? Yes     Baseline Oxygen Saturation % 92 %     1 Minute Oxygen Saturation % 91 %     1 Minute Liters of Oxygen 0 L     2 Minute Oxygen Saturation % 90 %     2 Minute Liters of Oxygen 0 L     3 Minute Oxygen Saturation % 88 %     3 Minute Liters of Oxygen 0 L     4 Minute Oxygen Saturation % 88 %     4 Minute Liters of Oxygen 0 L     5 Minute Oxygen Saturation % 87 %     5 Minute Liters of Oxygen 0 L     6 Minute Oxygen Saturation % 88 %     6 Minute Liters of Oxygen 0 L     2 Minute Post Oxygen Saturation % 94 %     2 Minute Post Liters of Oxygen 0 L

## 2023-08-24 NOTE — Telephone Encounter (Signed)
 Pt would like to know if she could change her 1:30 visit today 08/24/23 to a virtual visit.

## 2023-08-24 NOTE — Telephone Encounter (Signed)
 Left patient a message to see if she wanted to reschedule or come in today.

## 2023-08-24 NOTE — Progress Notes (Signed)
 Pulmonary Individual Treatment Plan  Patient Details  Name: [name] MRN: [id number] Date of Birth: [birth year] Referring Provider:   Flowsheet Row Pulmonary Rehab from 06/10/2023 in [facility] Cardiac and Pulmonary Rehab  Referring Provider Assaker, Marianne Shirts, MD    Initial Encounter Date:  Flowsheet Row Pulmonary Rehab from 06/10/2023 in [facility] Cardiac and Pulmonary Rehab  Date 06/10/23    Visit Diagnosis: Chronic obstructive pulmonary disease, unspecified COPD type (HCC)  Patient's Home Medications on Admission:  Current Outpatient Medications:    albuterol  (VENTOLIN  HFA) 108 (90 Base) MCG/ACT inhaler, TAKE 2 PUFFS BY MOUTH EVERY 6 HOURS AS NEEDED FOR WHEEZE OR SHORTNESS OF BREATH, Disp: 17 each, Rfl: 1   benzonatate  (TESSALON ) 100 MG capsule, Take 1 capsule (100 mg total) by mouth 2 (two) times daily as needed for cough., Disp: 20 capsule, Rfl: 0   budesonide -glycopyrrolate -formoterol  (BREZTRI  AEROSPHERE) 160-9-4.8 MCG/ACT AERO inhaler, Inhale 2 puffs into the lungs 2 (two) times daily., Disp: 10.7 g, Rfl: 11   Calcium Carb-Cholecalciferol  (CALCIUM 1000 + D PO), Take by mouth., Disp: , Rfl:    COLLAGEN PO, Take by mouth daily., Disp: , Rfl:    cyanocobalamin  100 MCG tablet, Take 100 mcg by mouth daily., Disp: , Rfl:    etanercept  (ENBREL  MINI) 50 MG/ML injection, INJECT 50 MG INTO THE SKIN ONCE A WEEK., Disp: 4 mL, Rfl: 0   folic acid  (FOLVITE ) 1 MG tablet, Take 2 tablets (2 mg total) by mouth daily., Disp: 180 tablet, Rfl: 3   hydrOXYzine  (VISTARIL ) 25 MG capsule, Take 1 capsule (25 mg total) by mouth at bedtime., Disp: 30 capsule, Rfl: 3   methotrexate  (RHEUMATREX) 2.5 MG tablet, Take 8 tablets (20 mg total) by mouth once a week. Caution:Chemotherapy. Protect from light., Disp: 32 tablet, Rfl: 2   Methotrexate , PF, (OTREXUP ) 20 MG/0.4ML SOAJ, Inject 20 mg into the skin once a week., Disp: 1.6 mL, Rfl: 2   Multiple Vitamin (MULTIVITAMIN WITH MINERALS) TABS tablet, Take 1 tablet by mouth  daily., Disp: , Rfl:    nicotine  (NICODERM CQ  - DOSED IN MG/24 HR) 7 mg/24hr patch, Place 1 patch (7 mg total) onto the skin daily. Please schedule appt with PCP for additional refills., Disp: 28 patch, Rfl: 0   tamoxifen  (NOLVADEX ) 20 MG tablet, Take 1 tablet (20 mg total) by mouth daily., Disp: 90 tablet, Rfl: 1   VITAMIN D  PO, Take by mouth daily., Disp: , Rfl:   Past Medical History: Past Medical History:  Diagnosis Date   Anemia    during pregnancy   Anxiety    Asthma    Bronchitis age 25   Cancer (HCC)    left breast ca   COPD (chronic obstructive pulmonary disease) (HCC)    Depression    Hx of bladder infections    been over 5 years since last one    Malignant neoplasm of upper-outer quadrant of left breast in female, estrogen receptor positive (HCC)    Rheumatoid arthritis (HCC)    unc rheum    Tobacco abuse    Trouble in sleeping     Tobacco Use: Social History   Tobacco Use  Smoking Status Former   Current packs/day: 0.00   Average packs/day: 1 pack/day for 40.0 years (40.0 ttl pk-yrs)   Types: Cigarettes   Start date: 03/10/1981   Quit date: 03/10/2021   Years since quitting: 2.4   Passive exposure: Past  Smokeless Tobacco Never  Tobacco Comments   on nicotine  patches  Labs: Review Flowsheet       Latest Ref Rng & Units 01/15/2018 07/30/2021  Labs for ITP Cardiac and Pulmonary Rehab  Cholestrol 100 - 199 mg/dL 865  784   LDL (calc) 0 - 99 mg/dL 67  84   HDL-C >69 mg/dL 77  71   Trlycerides 0 - 149 mg/dL 629  528      Pulmonary Assessment Scores:  Pulmonary Assessment Scores     Row Name 06/10/23 1117         mMRC Score   mMRC Score 3        UCSD: Self-administered rating of dyspnea associated with activities of daily living (ADLs) 6-point scale (0 = not at all to 5 = maximal or unable to do because of breathlessness)  Scoring Scores range from 0 to 120.  Minimally important difference is 5 units  CAT: CAT can identify the health  impairment of COPD patients and is better correlated with disease progression.  CAT has a scoring range of zero to 40. The CAT score is classified into four groups of low (less than 10), medium (10 - 20), high (21-30) and very high (31-40) based on the impact level of disease on health status. A CAT score over 10 suggests significant symptoms.  A worsening CAT score could be explained by an exacerbation, poor medication adherence, poor inhaler technique, or progression of COPD or comorbid conditions.  CAT MCID is 2 points  mMRC: mMRC (Modified Medical Research Council) Dyspnea Scale is used to assess the degree of baseline functional disability in patients of respiratory disease due to dyspnea. No minimal important difference is established. A decrease in score of 1 point or greater is considered a positive change.   Pulmonary Function Assessment:   Exercise Target Goals: Exercise Program Goal: Individual exercise prescription set using results from initial 6 min walk test and THRR while considering  patient's activity barriers and safety.   Exercise Prescription Goal: Initial exercise prescription builds to 30-45 minutes a day of aerobic activity, 2-3 days per week.  Home exercise guidelines will be given to patient during program as part of exercise prescription that the participant will acknowledge.  Education: Aerobic Exercise: - Group verbal and visual presentation on the components of exercise prescription. Introduces F.I.T.T principle from ACSM for exercise prescriptions.  Reviews F.I.T.T. principles of aerobic exercise including progression. Written material given at graduation.   Education: Resistance Exercise: - Group verbal and visual presentation on the components of exercise prescription. Introduces F.I.T.T principle from ACSM for exercise prescriptions  Reviews F.I.T.T. principles of resistance exercise including progression. Written material given at graduation.    Education:  Exercise & Equipment Safety: - Individual verbal instruction and demonstration of equipment use and safety with use of the equipment. Flowsheet Row Pulmonary Rehab from 07/16/2023 in [facility] Cardiac and Pulmonary Rehab  Date 06/10/23  Educator MB  Instruction Review Code 1- Verbalizes Understanding    Education: Exercise Physiology & General Exercise Guidelines: - Group verbal and written instruction with models to review the exercise physiology of the cardiovascular system and associated critical values. Provides general exercise guidelines with specific guidelines to those with heart or lung disease.    Education: Flexibility, Balance, Mind/Body Relaxation: - Group verbal and visual presentation with interactive activity on the components of exercise prescription. Introduces F.I.T.T principle from ACSM for exercise prescriptions. Reviews F.I.T.T. principles of flexibility and balance exercise training including progression. Also discusses the mind body connection.  Reviews various relaxation techniques to help  reduce and manage stress (i.e. Deep breathing, progressive muscle relaxation, and visualization). Balance handout provided to take home. Written material given at graduation.   Activity Barriers & Risk Stratification:  Activity Barriers & Cardiac Risk Stratification - 06/10/23 1108       Activity Barriers & Cardiac Risk Stratification   Activity Barriers Arthritis;Joint Problems   R shoulder, and RA         6 Minute Walk:  6 Minute Walk     Row Name 06/10/23 1103         6 Minute Walk   Phase Initial     Distance 1305 feet     Walk Time 6 minutes     # of Rest Breaks 0     MPH 2.47     METS 3.91     RPE 9     Perceived Dyspnea  3     VO2 Peak 13.69     Symptoms No     Resting HR 86 bpm     Resting BP 104/70     Resting Oxygen Saturation  92 %     Exercise Oxygen Saturation  during 6 min walk 87 %     Max Ex. HR 121 bpm     Max Ex. BP 132/62     2 Minute Post BP  102/64       Interval HR   1 Minute HR 103     2 Minute HR 114     3 Minute HR 116     4 Minute HR 114     5 Minute HR 117     6 Minute HR 121     2 Minute Post HR 107     Interval Heart Rate? Yes       Interval Oxygen   Interval Oxygen? Yes     Baseline Oxygen Saturation % 92 %     1 Minute Oxygen Saturation % 91 %     1 Minute Liters of Oxygen 0 L     2 Minute Oxygen Saturation % 90 %     2 Minute Liters of Oxygen 0 L     3 Minute Oxygen Saturation % 88 %     3 Minute Liters of Oxygen 0 L     4 Minute Oxygen Saturation % 88 %     4 Minute Liters of Oxygen 0 L     5 Minute Oxygen Saturation % 87 %     5 Minute Liters of Oxygen 0 L     6 Minute Oxygen Saturation % 88 %     6 Minute Liters of Oxygen 0 L     2 Minute Post Oxygen Saturation % 94 %     2 Minute Post Liters of Oxygen 0 L       Oxygen Initial Assessment:  Oxygen Initial Assessment - 05/26/23 1405       Home Oxygen   Home Oxygen Device None    Sleep Oxygen Prescription None    Home Exercise Oxygen Prescription None    Home Resting Oxygen Prescription None    Compliance with Home Oxygen Use Yes      Intervention   Short Term Goals To learn and understand importance of monitoring SPO2 with pulse oximeter and demonstrate accurate use of the pulse oximeter.;To learn and understand importance of maintaining oxygen saturations>88%;To learn and demonstrate proper pursed lip breathing techniques or other breathing techniques. ;To learn and demonstrate proper use of  respiratory medications    Long  Term Goals Maintenance of O2 saturations>88%;Verbalizes importance of monitoring SPO2 with pulse oximeter and return demonstration;Exhibits proper breathing techniques, such as pursed lip breathing or other method taught during program session;Compliance with respiratory medication;Demonstrates proper use of MDI's          Oxygen Re-Evaluation:  Oxygen Re-Evaluation     Row Name 06/16/23 0925             Program  Oxygen Prescription   Program Oxygen Prescription None         Home Oxygen   Home Oxygen Device None       Home Exercise Oxygen Prescription None       Home Resting Oxygen Prescription None       Compliance with Home Oxygen Use Yes         Goals/Expected Outcomes   Short Term Goals To learn and demonstrate proper pursed lip breathing techniques or other breathing techniques.        Long  Term Goals Exhibits proper breathing techniques, such as pursed lip breathing or other method taught during program session       Comments Reviewed PLB technique with pt.  Talked about how it works and it's importance in maintaining their exercise saturations.       Goals/Expected Outcomes Short: Become more profiecient at using PLB. Long: Become independent at using PLB.          Oxygen Discharge (Final Oxygen Re-Evaluation):  Oxygen Re-Evaluation - 06/16/23 0925       Program Oxygen Prescription   Program Oxygen Prescription None      Home Oxygen   Home Oxygen Device None    Home Exercise Oxygen Prescription None    Home Resting Oxygen Prescription None    Compliance with Home Oxygen Use Yes      Goals/Expected Outcomes   Short Term Goals To learn and demonstrate proper pursed lip breathing techniques or other breathing techniques.     Long  Term Goals Exhibits proper breathing techniques, such as pursed lip breathing or other method taught during program session    Comments Reviewed PLB technique with pt.  Talked about how it works and it's importance in maintaining their exercise saturations.    Goals/Expected Outcomes Short: Become more profiecient at using PLB. Long: Become independent at using PLB.          Initial Exercise Prescription:  Initial Exercise Prescription - 06/10/23 1100       Date of Initial Exercise RX and Referring Provider   Date 06/10/23    Referring Provider Assaker, Marianne Shirts, MD      Oxygen   Maintain Oxygen Saturation 88% or higher      Treadmill    MPH 2.4   Try   Grade 0    Minutes 15    METs 2.84      REL-XR   Level 3    Speed 50    Minutes 15    METs 3.91      T5 Nustep   Level 3    SPM 80    Minutes 15    METs 3.91      Track   Laps 34    Minutes 15    METs 2.85      Prescription Details   Frequency (times per week) 2    Duration Progress to 30 minutes of continuous aerobic without signs/symptoms of physical distress      Intensity  THRR 40-80% of Max Heartrate 114-142    Ratings of Perceived Exertion 11-13    Perceived Dyspnea 0-4      Progression   Progression Continue to progress workloads to maintain intensity without signs/symptoms of physical distress.      Resistance Training   Training Prescription Yes    Weight 3 lb    Reps 10-15          Perform Capillary Blood Glucose checks as needed.  Exercise Prescription Changes:   Exercise Prescription Changes     Row Name 06/10/23 1100 07/01/23 1400 07/15/23 1100 07/27/23 1400       Response to Exercise   Blood Pressure (Admit) 104/70 108/58 118/62 120/70    Blood Pressure (Exercise) 132/62 132/72 134/64 126/68    Blood Pressure (Exit) 102/64 120/70 112/60 100/60    Heart Rate (Admit) 86 bpm 82 bpm 86 bpm 94 bpm    Heart Rate (Exercise) 121 bpm 125 bpm 130 bpm 122 bpm    Heart Rate (Exit) 107 bpm 100 bpm 107 bpm 97 bpm    Oxygen Saturation (Admit) 92 % 94 % 94 % 92 %    Oxygen Saturation (Exercise) 87 % 89 % 90 % 87 %    Oxygen Saturation (Exit) 94 % 92 % 91 % 91 %    Rating of Perceived Exertion (Exercise) 9 13 13 13     Perceived Dyspnea (Exercise) 3 3 3 3     Symptoms none none none none    Comments results First 2 weeks of exercise -- --    Duration Progress to 30 minutes of  aerobic without signs/symptoms of physical distress Progress to 30 minutes of  aerobic without signs/symptoms of physical distress Progress to 30 minutes of  aerobic without signs/symptoms of physical distress Progress to 30 minutes of  aerobic without  signs/symptoms of physical distress    Intensity THRR New THRR unchanged THRR unchanged THRR unchanged      Progression   Progression Continue to progress workloads to maintain intensity without signs/symptoms of physical distress. Continue to progress workloads to maintain intensity without signs/symptoms of physical distress. Continue to progress workloads to maintain intensity without signs/symptoms of physical distress. Continue to progress workloads to maintain intensity without signs/symptoms of physical distress.    Average METs 3.91 3.3 2.98 3.1      Resistance Training   Training Prescription -- Yes Yes Yes    Weight -- 3 lb 3 lb 3 lb    Reps -- 10-15 10-15 10-15      Interval Training   Interval Training -- No No No      Oxygen   Oxygen -- Continuous -- --      Treadmill   MPH -- 1.7 -- --    Grade -- 0 -- --    Minutes -- 15 -- --    METs -- 2.3 -- --      NuStep   Level -- 3 3 --    Minutes -- 15 15 --    METs -- 3.4 3.7 --      REL-XR   Level -- 1 1 3     Minutes -- 15 15 15     METs -- 4.8 -- 4      Rower   Level -- -- -- 1    Watts -- -- -- 15    Minutes -- -- -- 15    METs -- -- -- 4.54      Track  Laps -- 45 37 27    Minutes -- 15 15 15     METs -- 3.45 3.01 2.47      Oxygen   Maintain Oxygen Saturation -- 88% or higher 88% or higher 88% or higher       Exercise Comments:   Exercise Goals and Review:   Exercise Goals     Row Name 06/10/23 1116             Exercise Goals   Increase Physical Activity Yes       Intervention Provide advice, education, support and counseling about physical activity/exercise needs.;Develop an individualized exercise prescription for aerobic and resistive training based on initial evaluation findings, risk stratification, comorbidities and participant's personal goals.       Expected Outcomes Short Term: Attend rehab on a regular basis to increase amount of physical activity.;Long Term: Add in home exercise to  make exercise part of routine and to increase amount of physical activity.;Long Term: Exercising regularly at least 3-5 days a week.       Increase Strength and Stamina Yes       Intervention Provide advice, education, support and counseling about physical activity/exercise needs.;Develop an individualized exercise prescription for aerobic and resistive training based on initial evaluation findings, risk stratification, comorbidities and participant's personal goals.       Expected Outcomes Short Term: Increase workloads from initial exercise prescription for resistance, speed, and METs.;Short Term: Perform resistance training exercises routinely during rehab and add in resistance training at home;Long Term: Improve cardiorespiratory fitness, muscular endurance and strength as measured by increased METs and functional capacity ( )       Able to understand and use rate of perceived exertion (RPE) scale Yes       Intervention Provide education and explanation on how to use RPE scale       Expected Outcomes Short Term: Able to use RPE daily in rehab to express subjective intensity level;Long Term:  Able to use RPE to guide intensity level when exercising independently       Able to understand and use Dyspnea scale Yes       Intervention Provide education and explanation on how to use Dyspnea scale       Expected Outcomes Short Term: Able to use Dyspnea scale daily in rehab to express subjective sense of shortness of breath during exertion;Long Term: Able to use Dyspnea scale to guide intensity level when exercising independently       Knowledge and understanding of Target Heart Rate Range (THRR) Yes       Intervention Provide education and explanation of THRR including how the numbers were predicted and where they are located for reference       Expected Outcomes Short Term: Able to state/look up THRR;Short Term: Able to use daily as guideline for intensity in rehab;Long Term: Able to use THRR to govern  intensity when exercising independently       Able to check pulse independently Yes       Intervention Provide education and demonstration on how to check pulse in carotid and radial arteries.;Review the importance of being able to check your own pulse for safety during independent exercise       Expected Outcomes Short Term: Able to explain why pulse checking is important during independent exercise;Long Term: Able to check pulse independently and accurately       Understanding of Exercise Prescription Yes       Intervention Provide education, explanation, and written  materials on patient's individual exercise prescription       Expected Outcomes Short Term: Able to explain program exercise prescription;Long Term: Able to explain home exercise prescription to exercise independently          Exercise Goals Re-Evaluation :  Exercise Goals Re-Evaluation     Row Name 06/16/23 9147 07/01/23 1405 07/15/23 1132 07/27/23 1421 08/13/23 1425     Exercise Goal Re-Evaluation   Exercise Goals Review Able to understand and use rate of perceived exertion (RPE) scale;Able to understand and use Dyspnea scale;Knowledge and understanding of Target Heart Rate Range (THRR);Understanding of Exercise Prescription Increase Physical Activity;Increase Strength and Stamina;Understanding of Exercise Prescription Increase Physical Activity;Increase Strength and Stamina;Understanding of Exercise Prescription Increase Physical Activity;Increase Strength and Stamina;Understanding of Exercise Prescription Increase Physical Activity;Increase Strength and Stamina;Understanding of Exercise Prescription   Comments Reviewed RPE and dyspnea scale, THR and program prescription with pt today.  Pt voiced understanding and was given a copy of goals to take home. Jyssica is off to a good start in the program. She was able to attend her first few sessions in the program during the time of this review. She used the treadmill at a speed of 1.  and no incline, and was able to walk 45 laps in 15 minutes. We will continue to monitor her progress in the program. Rilie continues to do well in the program. She has maintained her workload on the T4 nustep at level 3, and on the XR at level 1. She was able to walk 37 laps on the track in 15 minutes. We will continue to monitor her progress in the program. Brandin is doing well in rehab. She was recently able to add the rowing machine to her exercise prescription at level 1. She was also able to increase from level 1 to 3 on the XR. We will continue to monitor her progress in the program. Willye was unable to attend rehab during this review period. We will continue to stay in contact with her.   Expected Outcomes Short: Use RPE daily to regulate intensity. Long: Follow program prescription in THR. Short: Continue to follow exercise prescription. Long: Continue exercise to improve strength and stamina. Short: Continue to follow exercise prescription. Long: Continue exercise to improve strength and stamina. Short: Continue to follow exercise prescription. Long: Continue exercise to improve strength and stamina. Short: Return to rehab when able. Long: Continue exercise to improve strength and stamina.      Discharge Exercise Prescription (Final Exercise Prescription Changes):  Exercise Prescription Changes - 07/27/23 1400       Response to Exercise   Blood Pressure (Admit) 120/70    Blood Pressure (Exercise) 126/68    Blood Pressure (Exit) 100/60    Heart Rate (Admit) 94 bpm    Heart Rate (Exercise) 122 bpm    Heart Rate (Exit) 97 bpm    Oxygen Saturation (Admit) 92 %    Oxygen Saturation (Exercise) 87 %    Oxygen Saturation (Exit) 91 %    Rating of Perceived Exertion (Exercise) 13    Perceived Dyspnea (Exercise) 3    Symptoms none    Duration Progress to 30 minutes of  aerobic without signs/symptoms of physical distress    Intensity THRR unchanged      Progression   Progression Continue to  progress workloads to maintain intensity without signs/symptoms of physical distress.    Average METs 3.1      Resistance Training   Training Prescription Yes  Weight 3 lb    Reps 10-15      Interval Training   Interval Training No      REL-XR   Level 3    Minutes 15    METs 4      Rower   Level 1    Watts 15    Minutes 15    METs 4.54      Track   Laps 27    Minutes 15    METs 2.47      Oxygen   Maintain Oxygen Saturation 88% or higher          Nutrition:  Target Goals: Understanding of nutrition guidelines, daily intake of sodium 1500mg , cholesterol 200mg , calories 30% from fat and 7% or less from saturated fats, daily to have 5 or more servings of fruits and vegetables.  Education: All About Nutrition: -Group instruction provided by verbal, written material, interactive activities, discussions, models, and posters to present general guidelines for heart healthy nutrition including fat, fiber, MyPlate, the role of sodium in heart healthy nutrition, utilization of the nutrition label, and utilization of this knowledge for meal planning. Follow up email sent as well. Written material given at graduation.   Biometrics:  Pre Biometrics - 06/10/23 1116       Pre Biometrics   Height 5' 5.7 (1.669 m)    Weight 115 lb (52.2 kg)    Waist Circumference 28.5 inches    Hip Circumference 36.3 inches    Waist to Hip Ratio 0.79 %    BMI (Calculated) 18.73    Single Leg Stand 15 seconds           Nutrition Therapy Plan and Nutrition Goals:  Nutrition Therapy & Goals - 06/16/23 1149       Nutrition Therapy   Diet Mediterranean    Protein (specify units) 75-90    Fiber 25 grams    Whole Grain Foods 3 servings    Saturated Fats 15 max. grams    Fruits and Vegetables 5 servings/day    Sodium 2 grams      Personal Nutrition Goals   Nutrition Goal Eat 15-30gProtein and 30-60gCarbs at each meal.    Personal Goal #2 Reduce saturated fat, less than 12g per day.  Replace bad fats for more heart healthy fats.    Comments Patient drinking mostly water , sometimes a carbonated water  or ginger ale to settle her stomach. She eat 3 times per day, sometimes a snack or two. She knows she needs protein and tries to include one at each meals and sometimes her snacks have one too. She has been drinking a [employer] protein drinks (340kcal, 6gFat, 45gCHO, 38gSugar, 30gPro). We talked about the label, educated on how to read labels while also recommending a low sugar protein shake to try as well. She likes the sweetness of the [employer] shake. Overall, it will be fine, just try not to drink it with high carb meals or snacks. Reviewed mediterranean diet handout. Provided balanced plates sample handout, discussed calorie density and ways to help increase calories without increasing quantity. Explained her increased protein and calorie needs. She understands and will continue to prioritize protein and calories while choosing whole foods and eating smaller more frequent meals throughout the day.      Intervention Plan   Intervention Prescribe, educate and counsel regarding individualized specific dietary modifications aiming towards targeted core components such as weight, hypertension, lipid management, diabetes, heart failure and other comorbidities.;Nutrition handout(s) given to  patient.    Expected Outcomes Short Term Goal: Understand basic principles of dietary content, such as calories, fat, sodium, cholesterol and nutrients.;Short Term Goal: A plan has been developed with personal nutrition goals set during dietitian appointment.;Long Term Goal: Adherence to prescribed nutrition plan.          Nutrition Assessments:  MEDIFICTS Score Key: >=70 Need to make dietary changes  40-70 Heart Healthy Diet <= 40 Therapeutic Level Cholesterol Diet   Picture Your Plate Scores: <27 Unhealthy dietary pattern with much room for improvement. 41-50 Dietary pattern  unlikely to meet recommendations for good health and room for improvement. 51-60 More healthful dietary pattern, with some room for improvement.  >60 Healthy dietary pattern, although there may be some specific behaviors that could be improved.   Nutrition Goals Re-Evaluation:   Nutrition Goals Discharge (Final Nutrition Goals Re-Evaluation):   Psychosocial: Target Goals: Acknowledge presence or absence of significant depression and/or stress, maximize coping skills, provide positive support system. Participant is able to verbalize types and ability to use techniques and skills needed for reducing stress and depression.   Education: Stress, Anxiety, and Depression - Group verbal and visual presentation to define topics covered.  Reviews how body is impacted by stress, anxiety, and depression.  Also discusses healthy ways to reduce stress and to treat/manage anxiety and depression.  Written material given at graduation. Flowsheet Row Pulmonary Rehab from 07/16/2023 in [facility]  Date 07/16/23  Educator [facility]  Instruction Review Code 1- [employer] Understanding    Education: Sleep Hygiene -Provides group verbal and written instruction about how sleep can affect your health.  Define sleep hygiene, discuss sleep cycles and impact of sleep habits. Review good sleep hygiene tips.    Initial Review & Psychosocial Screening:  Initial Psych Review & Screening - 05/26/23 1409       Initial Review   Current issues with None Identified      Family Dynamics   Good Support System? Yes      Barriers   Psychosocial barriers to participate in program There are no identifiable barriers or psychosocial needs.      Screening Interventions   Interventions Provide feedback about the scores to participant;To provide support and resources with identified psychosocial needs;Encouraged to exercise    Expected Outcomes Short Term goal: Utilizing psychosocial counselor, staff and  physician to assist with identification of specific Stressors or current issues interfering with healing process. Setting desired goal for each stressor or current issue identified.;Long Term Goal: Stressors or current issues are controlled or eliminated.;Short Term goal: Identification and review with participant of any Quality of Life or Depression concerns found by scoring the questionnaire.;Long Term goal: The participant improves quality of Life and PHQ9 Scores as seen by post scores and/or verbalization of changes          Quality of Life Scores:  Scores of 19 and below usually indicate a poorer quality of life in these areas.  A difference of  2-3 points is a clinically meaningful difference.  A difference of 2-3 points in the total score of the Quality of Life Index has been associated with significant improvement in overall quality of life, self-image, physical symptoms, and general health in studies assessing change in quality of life.  PHQ-9: Review Flowsheet  More data exists      06/10/2023 01/30/2023 09/02/2022 06/17/2022 03/17/2022  Depression screen PHQ 2/9  Decreased Interest 0 1 1 1 1   Down, Depressed, Hopeless 1 0 0 0  0  PHQ - 2 Score 1 1 1 1 1   Altered sleeping 2 - - 2 3  Tired, decreased energy 2 - - 1 2  Change in appetite 0 - - 0 1  Feeling bad or failure about yourself  0 - - 0 0  Trouble concentrating 1 - - 0 1  Moving slowly or fidgety/restless 1 - - 1 1  Suicidal thoughts 0 - - 0 0  PHQ-9 Score 7 - - 5 9  Difficult doing work/chores Somewhat difficult - - - -   Interpretation of Total Score  Total Score Depression Severity:  1-4 = Minimal depression, 5-9 = Mild depression, 10-14 = Moderate depression, 15-19 = Moderately severe depression, 20-27 = Severe depression   Psychosocial Evaluation and Intervention:  Psychosocial Evaluation - 05/26/23 1409       Psychosocial Evaluation & Interventions   Interventions Encouraged to exercise with the program and follow  exercise prescription    Comments Mr. Hannibal is coming to pulmonary rehab with COPD. During the initial phone call, she was under a time constraint and was not able to go into detail. She enjoys doing aquatic exercises when she can and is currently doing physical therapy for her shoulder. She wants to attend the program to work on her breathing and stamina.    Expected Outcomes Short: attend pulmonary rehab for education and exercise Long: develop and maintain positive self care habits.    Continue Psychosocial Services  Follow up required by staff          Psychosocial Re-Evaluation:   Psychosocial Discharge (Final Psychosocial Re-Evaluation):   Education: Education Goals: Education classes will be provided on a weekly basis, covering required topics. Participant will state understanding/return demonstration of topics presented.  Learning Barriers/Preferences:  Learning Barriers/Preferences - 05/26/23 1408       Learning Barriers/Preferences   Learning Barriers None    Learning Preferences Individual Instruction          General Pulmonary Education Topics:  Infection Prevention: - Provides verbal and written material to individual with discussion of infection control including proper hand washing and proper equipment cleaning during exercise session. Flowsheet Row Pulmonary Rehab from 07/16/2023 in [facility] Cardiac and Pulmonary Rehab  Date 06/10/23  Educator MB  Instruction Review Code 1- Verbalizes Understanding    Falls Prevention: - Provides verbal and written material to individual with discussion of falls prevention and safety. Flowsheet Row Pulmonary Rehab from 07/16/2023 in [facility] Card[facility] and Pulmonary Rehab  Date 06/10/23  Educator MB  Instruction Review Code 1- Verbalizes Understanding    Chronic Lung Disease Review: - Group verbal instruction with posters, models, PowerPoint presentations and videos,  to review new updates, new respiratory medications, new  advancements in procedures and treatments. Providing information on websites and 800 numbers for continued self-education. Includes information about supplement oxygen, available portable oxygen systems, continuous and intermittent flow rates, oxygen safety, concentrators, and Medicare reimbursement for oxygen. Explanation of Pulmonary Drugs, including class, frequency, complications, importance of spacers, rinsing mouth after steroid MDI's, and proper cleaning methods for nebulizers. Review of basic lung anatomy and physiology related to function, structure, and complications of lung disease. Review of risk factors. Discussion about methods for diagnosing sleep apnea and types of masks and machines for OSA. Includes a review of the use of types of environmental controls: home humidity, furnaces, filters, dust mite/pet prevention, HEPA vacuums. Discussion about weather changes, air quality and the benefits of nasal washing. Instruction on Warning signs, infection symptoms,  calling MD promptly, preventive modes, and value of vaccinations. Review of effective airway clearance, coughing and/or vibration techniques. Emphasizing that all should Create an Action Plan. Written material given at graduation.   AED/CPR: - Group verbal and written instruction with the use of models to demonstrate the basic use of the AED with the basic ABC's of resuscitation.    Anatomy and Cardiac Procedures: - Group verbal and visual presentation and models provide information about basic cardiac anatomy and function. Reviews the testing methods done to diagnose heart disease and the outcomes of the test results. Describes the treatment choices: Medical Management, Angioplasty, or Coronary Bypass Surgery for treating various heart conditions including Myocardial Infarction, Angina, Valve Disease, and Cardiac Arrhythmias.  Written material given at graduation.   Medication Safety: - Group verbal and visual instruction to review  commonly prescribed medications for heart and lung disease. Reviews the medication, class of the drug, and side effects. Includes the steps to properly store meds and maintain the prescription regimen.  Written material given at graduation.   Other: -Provides group and verbal instruction on various topics (see comments)   Knowledge Questionnaire Score:    Core Components/Risk Factors/Patient Goals at Admission:  Personal Goals and Risk Factors at Admission - 06/10/23 1117       Core Components/Risk Factors/Patient Goals on Admission    Weight Management Yes;Weight Maintenance    Intervention Weight Management: Develop a combined nutrition and exercise program designed to reach desired caloric intake, while maintaining appropriate intake of nutrient and fiber, sodium and fats, and appropriate energy expenditure required for the weight goal.;Weight Management: Provide education and appropriate resources to help participant work on and attain dietary goals.;Weight Management/Obesity: Establish reasonable short term and long term weight goals.    Admit Weight 115 lb (52.2 kg)    Goal Weight: Short Term 115 lb (52.2 kg)    Goal Weight: Long Term 115 lb (52.2 kg)    Expected Outcomes Short Term: Continue to assess and modify interventions until short term weight is achieved;Long Term: Adherence to nutrition and physical activity/exercise program aimed toward attainment of established weight goal;Weight Maintenance: Understanding of the daily nutrition guidelines, which includes 25-35% calories from fat, 7% or less cal from saturated fats, less than 200mg  cholesterol, less than 1.5gm of sodium, & 5 or more servings of fruits and vegetables daily;Understanding recommendations for meals to include 15-35% energy as protein, 25-35% energy from fat, 35-60% energy from carbohydrates, less than 200mg  of dietary cholesterol, 20-35 gm of total fiber daily;Understanding of distribution of calorie intake  throughout the day with the consumption of 4-5 meals/snacks    Improve shortness of breath with ADL's Yes    Intervention Provide education, individualized exercise plan and daily activity instruction to help decrease symptoms of SOB with activities of daily living.    Expected Outcomes Short Term: Improve cardiorespiratory fitness to achieve a reduction of symptoms when performing ADLs;Long Term: Be able to perform more ADLs without symptoms or delay the onset of symptoms          Education:Diabetes - Individual verbal and written instruction to review signs/symptoms of diabetes, desired ranges of glucose level fasting, after meals and with exercise. Acknowledge that pre and post exercise glucose checks will be done for 3 sessions at entry of program.   Know Your Numbers and Heart Failure: - Group verbal and visual instruction to discuss disease risk factors for cardiac and pulmonary disease and treatment options.  Reviews associated critical values for Overweight/Obesity, Hypertension,  Cholesterol, and Diabetes.  Discusses basics of heart failure: signs/symptoms and treatments.  Introduces Heart Failure Zone chart for action plan for heart failure.  Written material given at graduation. Flowsheet Row Pulmonary Rehab from 07/16/2023 in [facility] Cardiac and Pulmonary Rehab  Date 07/02/23  Educator SB  Instruction Review Code 1- Verbalizes Understanding    Core Components/Risk Factors/Patient Goals Review:    Core Components/Risk Factors/Patient Goals at Discharge (Final Review):    ITP Comments:  ITP Comments     Row Name 05/26/23 1414 06/10/23 1102 06/16/23 0924 06/17/23 0928 07/15/23 1333   ITP Comments Initial phone call completed. Diagnosis can be found in [facility] 1/22. EP Orientation scheduled for Thursday 3/27 at 2pm. Completed and gym orientation. Initial ITP created and sent for review to Dr. Faud Aleskerov, Medical Director. First full day of exercise!  Patient was oriented to gym  and equipment including functions, settings, policies, and procedures.  Patient's individual exercise prescription and treatment plan were reviewed.  All starting workloads were established based on the results of the 6 minute walk test done at initial orientation visit.  The plan for exercise progression was also introduced and progression will be customized based on patient's performance and goals. 30 Day review completed. Medical Director ITP review done, changes made as directed, and signed approval by Medical Director.    new to program 30 Day review completed. Medical Director ITP review done, changes made as directed, and signed approval by Medical Director.    Row Name 08/12/23 1012 08/24/23 1439         ITP Comments 30 Day review completed. Medical Director ITP review done, changes made as directed, and signed approval by Medical Director. Cyra called staff to state she wishes to be discharged from the program at this time. She has completed 10/36 sessions. She is planning on sticking to her water  aerobics.         Comments: Early Discharge ITP

## 2023-08-24 NOTE — Telephone Encounter (Signed)
 Patient is overdue to update lab work--recommend in-person visit so it is ok if she needs to reschedule to another day this week

## 2023-08-25 ENCOUNTER — Encounter

## 2023-08-25 NOTE — Progress Notes (Signed)
 Office Visit Note  Patient: [name]             Date of Birth: 1960/11/03           MRN: [id number]             PCP: Vicci Barnie NOVAK, MD Referring: Vicci Barnie NOVAK, MD Visit Date: 09/02/2023 Occupation: @GUAROCC @  Subjective:  Medication monitoring   History of Present Illness: [name] is a 63 y.o. female with history of seropositive rheumatoid arthritis.  Patient remains on  Enbrel  50 mg sq injections once weekly, Otrexup  20 mg sq injections once weekly, and folic acid  2 mg daily.  She is tolerating combination therapy without any side effects and has not had any recent gaps in therapy.  Patient continues to have chronic pain and stiffness in the right shoulder but does not plan to proceed with surgical intervention at this time.  She has intermittent discomfort in her hands and several weeks ago had a flare involving the right hand with swelling.  Overall she continues to find her rheumatoid arthritis to be well-controlled on the current treatment regimen.  She denies any recent or recurrent infections. Patient states that she benefited from completing aquatic therapy in the past and would like to have a new order for physical therapy placed during the wintertime to help improve her range of motion and arthralgias.  Activities of Daily Living:  Patient reports morning stiffness for 15 minutes.   Patient Reports nocturnal pain.  Difficulty dressing/grooming: Denies Difficulty climbing stairs: Reports Difficulty getting out of chair: Denies Difficulty using hands for taps, buttons, cutlery, and/or writing: Reports  Review of Systems  Constitutional:  Positive for fatigue.  HENT: Negative.  Negative for mouth sores and mouth dryness.   Eyes: Negative.  Negative for dryness.  Respiratory:  Positive for shortness of breath.   Cardiovascular: Negative.  Negative for chest pain and palpitations.  Gastrointestinal: Negative.  Negative for blood in stool, constipation and  diarrhea.  Endocrine: Negative.  Negative for increased urination.  Genitourinary: Negative.  Negative for involuntary urination.  Musculoskeletal:  Positive for joint pain, joint pain, joint swelling and morning stiffness. Negative for gait problem, myalgias, muscle weakness, muscle tenderness and myalgias.  Skin: Negative.  Negative for color change, rash, hair loss and sensitivity to sunlight.  Allergic/Immunologic: Negative.  Negative for susceptible to infections.  Neurological:  Positive for headaches. Negative for dizziness.  Hematological: Negative.  Negative for swollen glands.  Psychiatric/Behavioral:  Positive for sleep disturbance. Negative for depressed mood. The patient is not nervous/anxious.     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   Adenomatous colon polyp 03/16/2019   Degenerative joint disease of shoulder region 06/08/2018   Rheumatoid arthritis involving both hands with positive rheumatoid factor (HCC) 06/08/2018   Rheumatoid arthritis involving both feet with positive rheumatoid factor (HCC) 06/08/2018   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   Status post total hip replacement, 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   Smoker 06/08/2018   Anxiety and depression 06/08/2018   Family history of breast cancer 03/25/2018   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  Rheumatoid arthritis involving multiple sites (HCC) 01/15/2018   Tobacco dependence 01/15/2018   Opioid use agreement exists 01/15/2018   Positive depression screening 01/15/2018   Homeless 01/15/2018    Past Medical History:  Diagnosis Date   Anemia     during pregnancy   Anxiety    Asthma    Bronchitis age 36   Cancer (HCC)    left breast ca   COPD (chronic obstructive pulmonary disease) (HCC)    Depression    Hx of bladder infections    been over 5 years since last one    Malignant neoplasm of upper-outer quadrant of left breast in female, estrogen receptor positive (HCC)    Rheumatoid arthritis (HCC)    unc rheum    Tobacco abuse    Trouble in sleeping     Family History  Problem Relation Age of Onset   Breast cancer Mother 66       metastatic   Cervical cancer Mother    Lung cancer Brother        maternal half brother; d. 80s   Lung cancer Maternal Uncle        d. 80s   Lung cancer Maternal Uncle        d. 62   Cancer Maternal Grandmother        unknown type; dx 56s   Healthy Son    Breast cancer Maternal Great-grandmother        dx unknown age   Past Surgical History:  Procedure Laterality Date   BREAST LUMPECTOMY WITH RADIOACTIVE SEED AND SENTINEL LYMPH NODE BIOPSY Left 08/30/2019   Procedure: LEFT BREAST LUMPECTOMY X 2 WITH RADIOACTIVE SEED AND SENTINEL LYMPH NODE BIOPSY;  Surgeon: Curvin Deward MOULD, MD;  Location: [facility] OR;  Service: General;  Laterality: Left;  PEC BLOCK   COLONOSCOPY WITH PROPOFOL  N/A 03/15/2019   Procedure: COLONOSCOPY WITH PROPOFOL ;  Surgeon: Therisa Bi, MD;  Location: [facility] ENDOSCOPY;  Service: Gastroenterology;  Laterality: N/A;   COLONOSCOPY WITH PROPOFOL  N/A 07/29/2022   Procedure: COLONOSCOPY WITH PROPOFOL ;  Surgeon: Therisa Bi, MD;  Location: [facility] ENDOSCOPY;  Service: Gastroenterology;  Laterality: N/A;   HIP ARTHROPLASTY     JOINT REPLACEMENT     MULTIPLE TOOTH EXTRACTIONS     RE-EXCISION OF BREAST LUMPECTOMY Left 10/07/2019   Procedure: RE-EXCISION OF LEFT BREAST INFERIOR MARGIN;  Surgeon: Curvin Deward MOULD, MD;  Location: [facility];  Service: General;  Laterality: Left;   TOTAL HIP ARTHROPLASTY Left 02/15/2018   Procedure: TOTAL HIP ARTHROPLASTY ANTERIOR APPROACH;  Surgeon:  Liam Lerner, MD;  Location: [facility] ORS;  Service: Orthopedics;  Laterality: Left;   WISDOM TOOTH EXTRACTION     Social History   Social History Narrative   Not on file   Immunization History  Administered Date(s) Administered   Influenza, Seasonal, Injecte, Preservative Fre 01/29/2023   Influenza,inj,Quad PF,6+ Mos 12/18/2020, 03/17/2022   PFIZER(Purple Top)SARS-COV-2 Vaccination 06/27/2019, 07/19/2019   Pneumococcal Conjugate-13 07/14/2018   Tdap 03/25/2018   Zoster Recombinant(Shingrix ) 07/14/2018, 06/17/2022     Objective: Vital Signs: BP 103/70 (BP Location: Left Arm, Patient Position: Sitting, Cuff Size: Normal)   Pulse 90   Resp 16   Ht 5' 5 (1.651 m)   Wt 112 lb (50.8 kg)   BMI 18.64 kg/m    Physical Exam Vitals and nursing note reviewed.  Constitutional:      Appearance: She is well-developed.  HENT:     Head: Normocephalic and atraumatic.   Eyes:  Conjunctiva/sclera: Conjunctivae normal.    Cardiovascular:     Rate and Rhythm: Normal rate and regular rhythm.     Heart sounds: Normal heart sounds.  Pulmonary:     Effort: Pulmonary effort is normal.     Breath sounds: Normal breath sounds.  Abdominal:     General: Bowel sounds are normal.     Palpations: Abdomen is soft.   Musculoskeletal:     Cervical back: Normal range of motion.  Lymphadenopathy:     Cervical: No cervical adenopathy.   Skin:    General: Skin is warm and dry.     Capillary Refill: Capillary refill takes less than 2 seconds.   Neurological:     Mental Status: She is alert and oriented to person, place, and time.   Psychiatric:        Behavior: Behavior normal.      Musculoskeletal Exam: C-spine has limited range of motion with lateral rotation.  Painful and limited mobility of both shoulders particularly the right shoulder.  Limited extension of the left elbow.  Wrist joints have good range of motion.  No tenderness or synovitis over MCP joints.  PIP and DIP thickening  consistent with osteoarthritis of both hands.  Synovial thickening of MCPs noted.  Left hip replacement has good range of motion with no groin pain.  Right hip has good range of motion with no discomfort.  Knee joints have good range of motion no warmth or effusion.  Ankle joints have good range of motion with no tenderness or joint swelling.  No tenderness of MTP joints.  CDAI Exam: CDAI Score: -- Patient Global: --; Provider Global: -- Swollen: --; Tender: -- Joint Exam 09/02/2023   No joint exam has been documented for this visit   There is currently no information documented on the homunculus. Go to the Rheumatology activity and complete the homunculus joint exam.  Investigation: No additional findings.  Imaging: No results found.  Recent Labs: Lab Results  Component Value Date   WBC 5.2 03/23/2023   HGB 14.3 03/23/2023   PLT 229 03/23/2023   NA 141 03/23/2023   K 4.5 03/23/2023   CL 104 03/23/2023   CO2 30 03/23/2023   GLUCOSE 82 03/23/2023   BUN 12 03/23/2023   CREATININE 0.75 03/23/2023   BILITOT 0.7 03/23/2023   ALKPHOS 66 12/14/2020   AST 17 03/23/2023   ALT 17 03/23/2023   PROT 6.6 03/23/2023   ALBUMIN 4.4 12/14/2020   CALCIUM 9.4 03/23/2023   GFRAA 101 07/30/2020   QFTBGOLDPLUS NEGATIVE 10/15/2022    Speciality Comments: No specialty comments available.  Procedures:  No procedures performed Allergies: Trazodone  and nefazodone and Other    Assessment / Plan:     Visit Diagnoses: Rheumatoid arthritis involving multiple sites with positive rheumatoid factor (HCC) - She has no synovitis on examination today.  Overall her rheumatoid arthritis remains well-controlled on the current treatment regimen.  She remains on Enbrel  50 mg subcutaneous injections once weekly and Otrexup  20 mg subcu days injections once weekly.  She has not had any recent gaps in therapy.  No recent or recurrent infections.  She has occasional discomfort in her right shoulder and both hands  typically exacerbated by overuse or repetitive activities.  Overall her symptoms have been manageable.  She has no active inflammation on examination today.  She was advised to notify us  if she develops any new or worsening symptoms.  No medication changes will be made at this time.  Patient  plans on reaching back out to us  in the winter months to place a new referral for aquatic therapy to help improve her range of motion, mobility, and arthralgias due to underlying rheumatoid arthritis and osteoarthritis overlap.   She will follow-up in the office in 5 months or sooner if needed.  Plan: Lipid panel  High risk medication use - Enbrel  50 mg sq injections once weekly, Otrexup  20 mg sq injections once weekly, folic acid  2 mg daily. CBC and CMP WNL on 03/23/23.  Order for CBC and CMP released today.  Her next lab work will be due in September and every 3 months to monitor for drug toxicity. TB gold negative on 10/15/22.  Future order for TB gold placed today. No recent or recurrent infections.  Discussed the importance of holding enbrel  if she develops signs or symptoms of an infection and to resume once the infection has completely cleared.    - Plan: CBC with Differential/Platelet, Comprehensive metabolic panel with GFR, Lipid panel, QuantiFERON-TB Gold Plus  Screening for tuberculosis -Future order for TB gold placed today.  Plan: QuantiFERON-TB Gold Plus  Chronic right shoulder pain: Chronic pain.  Limited range of motion with discomfort.  Under the care of Dr. Dozier.  Patient does not plan to proceed with surgical intervention at this time.  Contracture of left elbow: Limited extension.  No tenderness along the joint line currently.  Status post total hip replacement, left: Doing well.  No groin pain currently.  Other medical conditions are listed as follows:   [name]: 08/30/19 lumpectomy/radiation.No chemo.-no evidence of malignancy. Stable probably  benign reactive LN left axilla-ultrasound updated on 09/01/2022.on tamoxifen .   Other emphysema (HCC) - HRCT obtained on June 28, 2021 was consistent with mild bilateral bronchial wall thickening consistent with COPD and and a pulmonary nodule was noted.  Solitary pulmonary nodule on lung CT - Followed by Dr. Darlean  Smoker  Anxiety and depression  Family history of breast cancer  History of anemia    Orders: Orders Placed This Encounter  Procedures   CBC with Differential/Platelet   Comprehensive metabolic panel with GFR   Lipid panel   QuantiFERON-TB Gold Plus   No orders of the defined types were placed in this encounter.    Follow-Up Instructions: Return in about 5 months (around 02/02/2024) for Rheumatoid arthritis.   Waddell CHRISTELLA Craze, PA-C  Note - This record has been created using Dragon software.  Chart creation errors have been sought, but may not always  have been located. Such creation errors do not reflect on  the standard of medical care.

## 2023-08-27 ENCOUNTER — Encounter

## 2023-08-27 ENCOUNTER — Inpatient Hospital Stay: Admitting: Nurse Practitioner

## 2023-08-28 ENCOUNTER — Other Ambulatory Visit: Payer: Self-pay | Admitting: Physician Assistant

## 2023-08-28 ENCOUNTER — Other Ambulatory Visit (HOSPITAL_COMMUNITY): Payer: Self-pay

## 2023-08-28 ENCOUNTER — Other Ambulatory Visit: Payer: Self-pay

## 2023-08-28 DIAGNOSIS — M0579 Rheumatoid arthritis with rheumatoid factor of multiple sites without organ or systems involvement: Secondary | ICD-10-CM

## 2023-08-28 MED ORDER — OTREXUP 20 MG/0.4ML ~~LOC~~ SOAJ
20.0000 mg | SUBCUTANEOUS | 0 refills | Status: DC
  Filled 2023-08-28: qty 1.6, 28d supply, fill #0

## 2023-08-28 NOTE — Telephone Encounter (Signed)
 Last Fill: 04/08/2023  Labs: 03/23/2023 CBC and CMP WNL    Patient to update labs at upcoming appointment   Next Visit: 09/02/2023  Last Visit: 03/23/2023  DX: Rheumatoid arthritis involving multiple sites with positive rheumatoid factor   Current Dose per office note 03/23/2023: Otrexup  20 mg sq injections once weekly   Okay to refill Otrexup ?

## 2023-09-01 ENCOUNTER — Encounter

## 2023-09-02 ENCOUNTER — Ambulatory Visit: Attending: Physician Assistant | Admitting: Physician Assistant

## 2023-09-02 ENCOUNTER — Encounter: Payer: Self-pay | Admitting: Physician Assistant

## 2023-09-02 VITALS — BP 103/70 | HR 90 | Resp 16 | Ht 65.0 in | Wt 112.0 lb

## 2023-09-02 DIAGNOSIS — Z862 Personal history of diseases of the blood and blood-forming organs and certain disorders involving the immune mechanism: Secondary | ICD-10-CM | POA: Diagnosis not present

## 2023-09-02 DIAGNOSIS — Z96642 Presence of left artificial hip joint: Secondary | ICD-10-CM

## 2023-09-02 DIAGNOSIS — Z79899 Other long term (current) drug therapy: Secondary | ICD-10-CM

## 2023-09-02 DIAGNOSIS — M25511 Pain in right shoulder: Secondary | ICD-10-CM

## 2023-09-02 DIAGNOSIS — J438 Other emphysema: Secondary | ICD-10-CM

## 2023-09-02 DIAGNOSIS — F17200 Nicotine dependence, unspecified, uncomplicated: Secondary | ICD-10-CM | POA: Diagnosis not present

## 2023-09-02 DIAGNOSIS — R911 Solitary pulmonary nodule: Secondary | ICD-10-CM | POA: Diagnosis not present

## 2023-09-02 DIAGNOSIS — G8929 Other chronic pain: Secondary | ICD-10-CM

## 2023-09-02 DIAGNOSIS — D0512 Intraductal carcinoma in situ of left breast: Secondary | ICD-10-CM

## 2023-09-02 DIAGNOSIS — Z111 Encounter for screening for respiratory tuberculosis: Secondary | ICD-10-CM

## 2023-09-02 DIAGNOSIS — F419 Anxiety disorder, unspecified: Secondary | ICD-10-CM

## 2023-09-02 DIAGNOSIS — Z803 Family history of malignant neoplasm of breast: Secondary | ICD-10-CM | POA: Diagnosis not present

## 2023-09-02 DIAGNOSIS — M0579 Rheumatoid arthritis with rheumatoid factor of multiple sites without organ or systems involvement: Secondary | ICD-10-CM | POA: Diagnosis not present

## 2023-09-02 DIAGNOSIS — M24522 Contracture, left elbow: Secondary | ICD-10-CM | POA: Diagnosis not present

## 2023-09-02 DIAGNOSIS — F32A Depression, unspecified: Secondary | ICD-10-CM

## 2023-09-02 NOTE — Patient Instructions (Signed)

## 2023-09-03 ENCOUNTER — Ambulatory Visit: Payer: Self-pay | Admitting: Physician Assistant

## 2023-09-03 ENCOUNTER — Encounter

## 2023-09-03 LAB — CBC WITH DIFFERENTIAL/PLATELET
Absolute Lymphocytes: 2275 {cells}/uL (ref 850–3900)
Absolute Monocytes: 428 {cells}/uL (ref 200–950)
Basophils Absolute: 37 {cells}/uL (ref 0–200)
Basophils Relative: 0.6 %
Eosinophils Absolute: 136 {cells}/uL (ref 15–500)
Eosinophils Relative: 2.2 %
HCT: 46 % — ABNORMAL HIGH (ref 35.0–45.0)
Hemoglobin: 14.7 g/dL (ref 11.7–15.5)
MCH: 29.8 pg (ref 27.0–33.0)
MCHC: 32 g/dL (ref 32.0–36.0)
MCV: 93.3 fL (ref 80.0–100.0)
MPV: 10.5 fL (ref 7.5–12.5)
Monocytes Relative: 6.9 %
Neutro Abs: 3323 {cells}/uL (ref 1500–7800)
Neutrophils Relative %: 53.6 %
Platelets: 210 10*3/uL (ref 140–400)
RBC: 4.93 10*6/uL (ref 3.80–5.10)
RDW: 13.1 % (ref 11.0–15.0)
Total Lymphocyte: 36.7 %
WBC: 6.2 10*3/uL (ref 3.8–10.8)

## 2023-09-03 LAB — COMPREHENSIVE METABOLIC PANEL WITH GFR
AG Ratio: 2 (calc) (ref 1.0–2.5)
ALT: 15 U/L (ref 6–29)
AST: 16 U/L (ref 10–35)
Albumin: 4.5 g/dL (ref 3.6–5.1)
Alkaline phosphatase (APISO): 65 U/L (ref 37–153)
BUN: 17 mg/dL (ref 7–25)
CO2: 28 mmol/L (ref 20–32)
Calcium: 9.7 mg/dL (ref 8.6–10.4)
Chloride: 102 mmol/L (ref 98–110)
Creat: 0.81 mg/dL (ref 0.50–1.05)
Globulin: 2.2 g/dL (ref 1.9–3.7)
Glucose, Bld: 76 mg/dL (ref 65–99)
Potassium: 4.5 mmol/L (ref 3.5–5.3)
Sodium: 139 mmol/L (ref 135–146)
Total Bilirubin: 0.4 mg/dL (ref 0.2–1.2)
Total Protein: 6.7 g/dL (ref 6.1–8.1)
eGFR: 81 mL/min/{1.73_m2} (ref >=60)

## 2023-09-03 NOTE — Progress Notes (Signed)
 CBC and CMP WNL

## 2023-09-04 ENCOUNTER — Other Ambulatory Visit: Payer: Self-pay

## 2023-09-04 ENCOUNTER — Other Ambulatory Visit (HOSPITAL_COMMUNITY): Payer: Self-pay

## 2023-09-07 DIAGNOSIS — M546 Pain in thoracic spine: Secondary | ICD-10-CM | POA: Diagnosis not present

## 2023-09-07 DIAGNOSIS — M9902 Segmental and somatic dysfunction of thoracic region: Secondary | ICD-10-CM | POA: Diagnosis not present

## 2023-09-07 DIAGNOSIS — M542 Cervicalgia: Secondary | ICD-10-CM | POA: Diagnosis not present

## 2023-09-07 DIAGNOSIS — M9901 Segmental and somatic dysfunction of cervical region: Secondary | ICD-10-CM | POA: Diagnosis not present

## 2023-09-08 ENCOUNTER — Encounter

## 2023-09-08 ENCOUNTER — Ambulatory Visit: Payer: No Typology Code available for payment source | Attending: Internal Medicine

## 2023-09-09 ENCOUNTER — Telehealth: Payer: Self-pay | Admitting: Internal Medicine

## 2023-09-09 NOTE — Telephone Encounter (Signed)
 Copied from CRM [id number]. Topic: General - Other >> Sep 09, 2023  9:43 AM Nichoel BRAVO wrote:  Reason for CRM: patient returning call from [facility] RN patient is unsure what this is regarding. Patient would like a call back [phone]

## 2023-09-10 ENCOUNTER — Encounter

## 2023-09-10 ENCOUNTER — Other Ambulatory Visit (HOSPITAL_COMMUNITY): Payer: Self-pay

## 2023-09-15 ENCOUNTER — Ambulatory Visit: Attending: Internal Medicine

## 2023-09-15 ENCOUNTER — Encounter

## 2023-09-15 VITALS — Ht 65.0 in | Wt 115.0 lb

## 2023-09-15 DIAGNOSIS — Z0000 Encounter for general adult medical examination without abnormal findings: Secondary | ICD-10-CM | POA: Diagnosis not present

## 2023-09-15 NOTE — Progress Notes (Cosign Needed Addendum)
 Because this visit was a virtual/telehealth visit,  certain criteria was not obtained, such a blood pressure, CBG if applicable, and timed get up and go. Any medications not marked as taking were not mentioned during the medication reconciliation part of the visit. Any vitals not documented were not able to be obtained due to this being a telehealth visit or patient was unable to self-report a recent blood pressure reading due to a lack of equipment at home via telehealth. Vitals that have been documented are verbally provided by the patient.   Subjective:   [name] is a 63 y.o. who presents for a Medicare Wellness preventive visit.  As a reminder, Annual Wellness Visits don't include a physical exam, and some assessments may be limited, especially if this visit is performed virtually. We may recommend an in-person follow-up visit with your provider if needed.  Visit Complete: Virtual I connected with  [name] on 09/15/23 by a audio enabled telemedicine application and verified that I am speaking with the correct person using two identifiers.  Patient Location: Home  Provider Location: Office/Clinic  I discussed the limitations of evaluation and management by telemedicine. The patient expressed understanding and agreed to proceed.  Vital Signs: Because this visit was a virtual/telehealth visit, some criteria may be missing or patient reported. Any vitals not documented were not able to be obtained and vitals that have been documented are patient reported.  VideoDeclined- This patient declined Librarian, academic. Therefore the visit was completed with audio only.  Persons Participating in Visit: Patient.  AWV Questionnaire: No: Patient Medicare AWV questionnaire was not completed prior to this visit.  Cardiac Risk Factors include: advanced age (>47men, >1 women)     Objective:    Today's Vitals   09/15/23 1610  Weight: 115 lb (52.2 kg)   Height: 5' 5 (1.651 m)  PainSc: 0-No pain   Body mass index is 19.14 kg/m.     09/15/2023    4:12 PM 05/30/2023    1:25 PM 01/27/2023    2:20 PM 09/02/2022    4:32 PM 07/07/2022    3:23 PM 01/03/2022    1:53 PM 08/23/2021    9:53 AM  Advanced Directives  Does Patient Have a Medical Advance Directive? No No No No No No No  Would patient like information on creating a medical advance directive? No - Patient declined  No - Patient declined Yes (MAU/Ambulatory/Procedural Areas - Information given) No - Patient declined No - Patient declined No - Patient declined    Current Medications (verified) Outpatient Encounter Medications as of 09/15/2023  Medication Sig   albuterol  (VENTOLIN  HFA) 108 (90 Base) MCG/ACT inhaler TAKE 2 PUFFS BY MOUTH EVERY 6 HOURS AS NEEDED FOR WHEEZE OR SHORTNESS OF BREATH   benzonatate  (TESSALON ) 100 MG capsule Take 1 capsule (100 mg total) by mouth 2 (two) times daily as needed for cough.   budesonide -glycopyrrolate -formoterol  (BREZTRI  AEROSPHERE) 160-9-4.8 MCG/ACT AERO inhaler Inhale 2 puffs into the lungs 2 (two) times daily.   Calcium Carb-Cholecalciferol  (CALCIUM 1000 + D PO) Take by mouth.   COLLAGEN PO Take by mouth daily.   cyanocobalamin  100 MCG tablet Take 100 mcg by mouth daily.   etanercept  (ENBREL  MINI) 50 MG/ML injection INJECT 50 MG INTO THE SKIN ONCE A WEEK.   folic acid  (FOLVITE ) 1 MG tablet Take 2 tablets (2 mg total) by mouth daily.   hydrOXYzine  (VISTARIL ) 25 MG capsule Take 1 capsule (25 mg total) by mouth  at bedtime.   methotrexate  (RHEUMATREX) 2.5 MG tablet Take 8 tablets (20 mg total) by mouth once a week. Caution:Chemotherapy. Protect from light. (Patient not taking: Reported on 09/02/2023)   Methotrexate , PF, (OTREXUP ) 20 MG/0.4ML SOAJ Inject 20 mg into the skin once a week.   Multiple Vitamin (MULTIVITAMIN WITH MINERALS) TABS tablet Take 1 tablet by mouth daily.   nicotine  (NICODERM CQ  - DOSED IN MG/24 HR) 7 mg/24hr patch Place 1 patch (7 mg  total) onto the skin daily. Please schedule appt with PCP for additional refills.   OHTUVAYRE  3 MG/2.5ML SUSP    tamoxifen  (NOLVADEX ) 20 MG tablet Take 1 tablet (20 mg total) by mouth daily.   VITAMIN D  PO Take by mouth daily.   No facility-administered encounter medications on file as of 09/15/2023.    Allergies (verified) Trazodone  and nefazodone and Other   History: Past Medical History:  Diagnosis Date   Anemia    during pregnancy   Anxiety    Asthma    Bronchitis age 31   Cancer (HCC)    left breast ca   COPD (chronic obstructive pulmonary disease) (HCC)    Depression    Hx of bladder infections    been over 5 years since last one    Malignant neoplasm of upper-outer quadrant of left breast in female, estrogen receptor positive (HCC)    Rheumatoid arthritis (HCC)    unc rheum    Tobacco abuse    Trouble in sleeping    Past Surgical History:  Procedure Laterality Date   BREAST LUMPECTOMY WITH RADIOACTIVE SEED AND SENTINEL LYMPH NODE BIOPSY Left 08/30/2019   Procedure: LEFT BREAST LUMPECTOMY X 2 WITH RADIOACTIVE SEED AND SENTINEL LYMPH NODE BIOPSY;  Surgeon: Curvin Deward MOULD, MD;  Location: [facility] OR;  Service: General;  Laterality: Left;  PEC BLOCK   COLONOSCOPY WITH PROPOFOL  N/A 03/15/2019   Procedure: COLONOSCOPY WITH PROPOFOL ;  Surgeon: Therisa Bi, MD;  Location: [facility] ENDOSCOPY;  Service: Gastroenterology;  Laterality: N/A;   COLONOSCOPY WITH PROPOFOL  N/A 07/29/2022   Procedure: COLONOSCOPY WITH PROPOFOL ;  Surgeon: Therisa Bi, MD;  Location: [facility] ENDOSCOPY;  Service: Gastroenterology;  Laterality: N/A;   HIP ARTHROPLASTY     JOINT REPLACEMENT     MULTIPLE TOOTH EXTRACTIONS     RE-EXCISION OF BREAST LUMPECTOMY Left 10/07/2019   Procedure: RE-EXCISION OF LEFT BREAST INFERIOR MARGIN;  Surgeon: Curvin Deward MOULD, MD;  Location: [facility];  Service: General;  Laterality: Left;   TOTAL HIP ARTHROPLASTY Left 02/15/2018   Procedure: TOTAL HIP ARTHROPLASTY ANTERIOR  APPROACH;  Surgeon: Liam Lerner, MD;  Location: [facility] ORS;  Service: Orthopedics;  Laterality: Left;   WISDOM TOOTH EXTRACTION     Family History  Problem Relation Age of Onset   Breast cancer Mother 6       metastatic   Cervical cancer Mother    Lung cancer Brother        maternal half brother; d. 47s   Lung cancer Maternal Uncle        d. 37s   Lung cancer Maternal Uncle        d. 69   Cancer Maternal Grandmother        unknown type; dx 52s   Healthy Son    Breast cancer Maternal Great-grandmother        dx unknown age   Social History   Socioeconomic History   Marital status: Single    Spouse name: Not on file   Number of  children: 1   Years of education: 14 years   Highest education level: Some college, no degree  Occupational History   Not on file  Tobacco Use   Smoking status: Former    Current packs/day: 0.00    Average packs/day: 1 pack/day for 40.0 years (40.0 ttl pk-yrs)    Types: Cigarettes    Start date: 03/10/1981    Quit date: 03/10/2021    Years since quitting: 2.5    Passive exposure: Past   Smokeless tobacco: Never   Tobacco comments:    on nicotine  patches  Vaping Use   Vaping status: Never Used  Substance and Sexual Activity   Alcohol use: Yes    Comment: Occassionally   Drug use: Not Currently    Types: Cocaine    Comment: it's been years ago   Sexual activity: Not Currently    Birth control/protection: Post-menopausal  Other Topics Concern   Not on file  Social History Narrative   Not on file   Social Drivers of Health   Financial Resource Strain: Low Risk  (09/15/2023)   Overall Financial Resource Strain (CARDIA)    Difficulty of Paying Living Expenses: Not very hard  Food Insecurity: No Food Insecurity (09/15/2023)   Hunger Vital Sign    Worried About Running Out of Food in the Last Year: Never true    Ran Out of Food in the Last Year: Never true  Transportation Needs: No Transportation Needs (09/15/2023)   PRAPARE - Therapist, art (Medical): No    Lack of Transportation (Non-Medical): No  Physical Activity: Sufficiently Active (09/15/2023)   Exercise Vital Sign    Days of Exercise per Week: 4 days    Minutes of Exercise per Session: 60 min  Stress: No Stress Concern Present (09/15/2023)   [employer] of Occupational Health - Occupational Stress Questionnaire    Feeling of Stress: Not at all  Social Connections: Moderately Integrated (09/15/2023)   Social Connection and Isolation Panel    Frequency of Communication with Friends and Family: More than three times a week    Frequency of Social Gatherings with Friends and Family: More than three times a week    Attends Religious Services: 1 to 4 times per year    Active Member of [employer] or Organizations: No    Attends Engineer, structural: More than 4 times per year    Marital Status: Widowed    Tobacco Counseling Counseling given: Not Answered Tobacco comments: on nicotine  patches    Clinical Intake:  Pre-visit preparation completed: Yes  Pain : No/denies pain Pain Score: 0-No pain     BMI - recorded: 19.14 Nutritional Status: BMI of 19-24  Normal Nutritional Risks: None Diabetes: No  No results found for: HGBA1C   How often do you need to have someone help you when you read instructions, pamphlets, or other written materials from your doctor or pharmacy?: 1 - Never  Interpreter Needed?: No  Information entered by :: Diontay Rosencrans N. Fabyan Loughmiller, LPN.   Activities of Daily Living     09/15/2023    4:14 PM  In your present state of health, do you have any difficulty performing the following activities:  Hearing? 0  Vision? 0  Difficulty concentrating or making decisions? 0  Walking or climbing stairs? 0  Dressing or bathing? 0  Doing errands, shopping? 0  Preparing Food and eating ? N  Using the Toilet? N  In the past six months, have  you accidently leaked urine? Y  Do you have problems with loss of bowel control?  N  Managing your Medications? N  Managing your Finances? N  Housekeeping or managing your Housekeeping? N    Patient Care Team: Vicci Barnie NOVAK, MD as PCP - General (Internal Medicine) Curvin Deward MOULD, MD as Consulting Physician (General Surgery) Dolphus Reiter, MD as Consulting Physician (Rheumatology) Stacia Arlean BROCKS, LCSW (Inactive) as Social Worker (General Practice) Dale, Taylor M, PA-C as Physician Assistant (Physician Assistant) Rennie Cindy SAUNDERS, MD as Consulting Physician (Oncology)  I have updated your Care Teams any recent Medical Services you may have received from other providers in the past year.     Assessment:   This is a routine wellness examination for Carizma.  Hearing/Vision screen Hearing Screening - Comments:: Adequate hearing, no hearing aids. Vision Screening - Comments:: Adequate vision, wears rx glasses - up to date with routine eye exams with Steven Dingledein, MD.    Goals Addressed             This Visit's Progress    09/15/2023: To try the Zephyn Implant to have a better quality of life with breathing.         Depression Screen     09/15/2023    4:16 PM 06/10/2023   11:18 AM 01/30/2023   11:51 AM 09/02/2022    4:29 PM 06/17/2022    2:39 PM 03/17/2022    2:02 PM 08/09/2021   10:32 AM  PHQ 2/9 Scores  PHQ - 2 Score 0 1 1 1 1 1  0  PHQ- 9 Score 0 7   5 9      Fall Risk     09/15/2023    4:14 PM 01/29/2023    1:46 PM 09/02/2022    4:31 PM 06/17/2022    1:43 PM 03/17/2022    2:02 PM  Fall Risk   Falls in the past year? 0 0 0 0 0  Number falls in past yr: 0 0 0 0 0  Injury with Fall? 0 0 0 0 0  Risk for fall due to : No Fall Risks No Fall Risks No Fall Risks No Fall Risks No Fall Risks  Follow up Falls evaluation completed  Falls prevention discussed;Education provided;Falls evaluation completed      MEDICARE RISK AT HOME:  Medicare Risk at Home Any stairs in or around the home?: Yes If so, are there any without handrails?: No Home  free of loose throw rugs in walkways, pet beds, electrical cords, etc?: Yes Adequate lighting in your home to reduce risk of falls?: Yes Life alert?: No Use of a cane, walker or w/c?: No Grab bars in the bathroom?: Yes Shower chair or bench in shower?: No Elevated toilet seat or a handicapped toilet?: Yes  TIMED UP AND GO:  Was the test performed?  No  Cognitive Function: Declined/Normal: No cognitive concerns noted by patient or family. Patient alert, oriented, able to answer questions appropriately and recall recent events. No signs of memory loss or confusion.    09/15/2023    4:16 PM  MMSE - Mini Mental State Exam  Not completed: Unable to complete        09/15/2023    4:12 PM 09/02/2022    4:32 PM 08/09/2021   10:37 AM  6CIT Screen  What Year? 0 points 0 points 0 points  What month? 0 points 0 points 0 points  What time? 0 points 0 points 0 points  Count back  from 20 0 points 0 points 0 points  Months in reverse 0 points 0 points 0 points  Repeat phrase 0 points 0 points 0 points  Total Score 0 points 0 points 0 points    Immunizations Immunization History  Administered Date(s) Administered   Influenza, Seasonal, Injecte, Preservative Fre 01/29/2023   Influenza,inj,Quad PF,6+ Mos 12/18/2020, 03/17/2022   PFIZER(Purple Top)SARS-COV-2 Vaccination 06/27/2019, 07/19/2019   Pneumococcal Conjugate-13 07/14/2018   Tdap 03/25/2018   Zoster Recombinant(Shingrix ) 07/14/2018, 06/17/2022    Screening Tests Health Maintenance  Topic Date Due   Pneumococcal Vaccine 89-27 Years old (2 of 2 - PPSV23, PCV20, or PCV21) 09/08/2018   COVID-19 Vaccine (3 - Pfizer risk series) 08/16/2019   INFLUENZA VACCINE  10/09/2023   Lung Cancer Screening  02/10/2024   Medicare Annual Wellness (AWV)  09/14/2024   MAMMOGRAM  03/25/2025   Colonoscopy  07/28/2025   Cervical Cancer Screening (HPV/Pap Cotest)  07/20/2026   DTaP/Tdap/Td (2 - Td or Tdap) 03/25/2028   Hepatitis C Screening  Completed    HIV Screening  Completed   Zoster Vaccines- Shingrix   Completed   Hepatitis B Vaccines  Aged Out   HPV VACCINES  Aged Out   Meningococcal B Vaccine  Aged Out    Health Maintenance  Health Maintenance Due  Topic Date Due   Pneumococcal Vaccine 86-29 Years old (2 of 2 - PPSV23, PCV20, or PCV21) 09/08/2018   COVID-19 Vaccine (3 - Pfizer risk series) 08/16/2019   Health Maintenance Items Addressed: Yes Patient is due for Covid-19 and Pneumococcal Vaccines.  Additional Screening:  Vision Screening: Recommended annual ophthalmology exams for early detection of glaucoma and other disorders of the eye. Would you like a referral to an eye doctor? No    Dental Screening: Recommended annual dental exams for proper oral hygiene  Community Resource Referral / Chronic Care Management: CRR required this visit?  No   CCM required this visit?  No   Plan:    I have personally reviewed and noted the following in the patient's chart:   Medical and social history Use of alcohol, tobacco or illicit drugs  Current medications and supplements including opioid prescriptions. Patient is not currently taking opioid prescriptions. Functional ability and status Nutritional status Physical activity Advanced directives List of other physicians Hospitalizations, surgeries, and ER visits in previous 12 months Vitals Screenings to include cognitive, depression, and falls Referrals and appointments  In addition, I have reviewed and discussed with patient certain preventive protocols, quality metrics, and best practice recommendations. A written personalized care plan for preventive services as well as general preventive health recommendations were provided to patient.   Roz LOISE Fuller, LPN   04/10/7972   After Visit Summary: (MyChart) Due to this being a telephonic visit, the after visit summary with patients personalized plan was offered to patient via MyChart   Notes: Nothing significant to  report at this time.

## 2023-09-15 NOTE — Patient Instructions (Signed)
 Ms. Vahey , Thank you for taking time out of your busy schedule to complete your Annual Wellness Visit with me. I enjoyed our conversation and look forward to speaking with you again next year. I, as well as your care team,  appreciate your ongoing commitment to your health goals. Please review the following plan we discussed and let me know if I can assist you in the future. Your Game plan/ To Do List    Referrals: If you haven't heard from the office you've been referred to, please reach out to them at the phone provided.   Follow up Visits: Next Medicare AWV with our clinical staff: 09/20/2024 at 4:10 p.m. phone visit with Nurse Roz   Have you seen your provider in the last 6 months (3 months if uncontrolled diabetes)? No Next Office Visit with your provider: 10/27/2023 at 3:30 p.m. office visit with Dr. Vicci  Clinician Recommendations:  Aim for 30 minutes of exercise or brisk walking, 6-8 glasses of water , and 5 servings of fruits and vegetables each day.       This is a list of the screening recommended for you and due dates:  Health Maintenance  Topic Date Due   Pneumococcal Vaccination (2 of 2 - PPSV23, PCV20, or PCV21) 09/08/2018   COVID-19 Vaccine (3 - Pfizer risk series) 08/16/2019   Flu Shot  10/09/2023   Screening for Lung Cancer  02/10/2024   Medicare Annual Wellness Visit  09/14/2024   Mammogram  03/25/2025   Colon Cancer Screening  07/28/2025   Pap with HPV screening  07/20/2026   DTaP/Tdap/Td vaccine (2 - Td or Tdap) 03/25/2028   Hepatitis C Screening  Completed   HIV Screening  Completed   Zoster (Shingles) Vaccine  Completed   Hepatitis B Vaccine  Aged Out   HPV Vaccine  Aged Out   Meningitis B Vaccine  Aged Out    Advanced directives: (Declined) Advance directive discussed with you today. Even though you declined this today, please call our office should you change your mind, and we can give you the proper paperwork for you to fill out. Advance Care Planning  is important because it:  [x]  Makes sure you receive the medical care that is consistent with your values, goals, and preferences  [x]  It provides guidance to your family and loved ones and reduces their decisional burden about whether or not they are making the right decisions based on your wishes.  Follow the link provided in your after visit summary or read over the paperwork we have mailed to you to help you started getting your Advance Directives in place. If you need assistance in completing these, please reach out to us  so that we can help you!  See attachments for Preventive Care and Fall Prevention Tips.

## 2023-09-16 ENCOUNTER — Telehealth: Payer: Self-pay

## 2023-09-16 NOTE — Telephone Encounter (Signed)
 Copied from CRM [id number]. Topic: Clinical - Medication Question >> Sep 16, 2023 12:03 PM Celestine FALCON wrote: Reason for CRM: Pt stated she just started the OHTUVAYRE  nebulizer and she noticed that if she does it 2x a day she is having digestive issues, but 1x a day is working better for her. Pt wanted to let Dr. Malka know this, and seek an opinion if she should remain 2x or 1x a day. Pt's phone number is [phone].

## 2023-09-17 ENCOUNTER — Other Ambulatory Visit: Payer: Self-pay | Admitting: Physician Assistant

## 2023-09-17 ENCOUNTER — Other Ambulatory Visit (HOSPITAL_COMMUNITY): Payer: Self-pay

## 2023-09-17 ENCOUNTER — Other Ambulatory Visit: Payer: Self-pay

## 2023-09-17 ENCOUNTER — Encounter

## 2023-09-17 MED ORDER — ENBREL MINI 50 MG/ML ~~LOC~~ SOCT
50.0000 mg | SUBCUTANEOUS | 0 refills | Status: DC
  Filled 2023-09-17 – 2023-10-05 (×2): qty 4, 28d supply, fill #0
  Filled 2023-10-29: qty 4, 28d supply, fill #1
  Filled 2023-11-30: qty 4, 28d supply, fill #2

## 2023-09-17 NOTE — Telephone Encounter (Signed)
 Attempted to contact patient and left message to advise patient to call the office and schedule appointment.

## 2023-09-17 NOTE — Telephone Encounter (Signed)
 Last Fill: 08/20/2023 (30 day supply)  Labs: 09/02/2023 CBC and CMP WNL   TB Gold: 10/15/2022 Neg    Next Visit: Due November 2025. Message sent to the front to schedule.   Last Visit: 09/02/2023  DX: Rheumatoid arthritis involving multiple sites with positive rheumatoid factor   Current Dose per office note 09/02/2023: Enbrel  50 mg sq injections once weekly   Okay to refill Enbrel ?

## 2023-09-17 NOTE — Telephone Encounter (Signed)
 Please schedule patient a follow up visit. Patient due November 2025. Thanks!   Follow-Up Instructions: Return in about 5 months (around 02/02/2024) for Rheumatoid arthritis.

## 2023-09-21 DIAGNOSIS — M9901 Segmental and somatic dysfunction of cervical region: Secondary | ICD-10-CM | POA: Diagnosis not present

## 2023-09-21 DIAGNOSIS — M9902 Segmental and somatic dysfunction of thoracic region: Secondary | ICD-10-CM | POA: Diagnosis not present

## 2023-09-21 DIAGNOSIS — M542 Cervicalgia: Secondary | ICD-10-CM | POA: Diagnosis not present

## 2023-09-21 DIAGNOSIS — M546 Pain in thoracic spine: Secondary | ICD-10-CM | POA: Diagnosis not present

## 2023-09-22 ENCOUNTER — Encounter

## 2023-09-23 ENCOUNTER — Ambulatory Visit
Admission: RE | Admit: 2023-09-23 | Discharge: 2023-09-23 | Disposition: A | Discharge status: Home / Self Care | Source: Ambulatory Visit | Attending: Pulmonary Disease | Admitting: Pulmonary Disease

## 2023-09-23 DIAGNOSIS — J449 Chronic obstructive pulmonary disease, unspecified: Secondary | ICD-10-CM | POA: Diagnosis not present

## 2023-09-23 DIAGNOSIS — R0609 Other forms of dyspnea: Secondary | ICD-10-CM | POA: Insufficient documentation

## 2023-09-23 DIAGNOSIS — I358 Other nonrheumatic aortic valve disorders: Secondary | ICD-10-CM | POA: Diagnosis not present

## 2023-09-23 LAB — ECHOCARDIOGRAM COMPLETE: S' Lateral: 2.4 cm

## 2023-09-24 ENCOUNTER — Encounter

## 2023-09-26 ENCOUNTER — Other Ambulatory Visit: Payer: Self-pay | Admitting: Internal Medicine

## 2023-09-29 ENCOUNTER — Encounter

## 2023-10-01 ENCOUNTER — Encounter

## 2023-10-01 ENCOUNTER — Other Ambulatory Visit (HOSPITAL_COMMUNITY): Payer: Self-pay

## 2023-10-02 ENCOUNTER — Inpatient Hospital Stay: Attending: Internal Medicine | Admitting: Nurse Practitioner

## 2023-10-02 VITALS — BP 96/71 | HR 108 | Resp 18 | Ht 65.0 in | Wt 110.0 lb

## 2023-10-02 DIAGNOSIS — I890 Lymphedema, not elsewhere classified: Secondary | ICD-10-CM

## 2023-10-02 DIAGNOSIS — Z170 Estrogen receptor positive status [ER+]: Secondary | ICD-10-CM | POA: Insufficient documentation

## 2023-10-02 DIAGNOSIS — Z8049 Family history of malignant neoplasm of other genital organs: Secondary | ICD-10-CM | POA: Insufficient documentation

## 2023-10-02 DIAGNOSIS — Z08 Encounter for follow-up examination after completed treatment for malignant neoplasm: Secondary | ICD-10-CM

## 2023-10-02 DIAGNOSIS — Z803 Family history of malignant neoplasm of breast: Secondary | ICD-10-CM | POA: Diagnosis not present

## 2023-10-02 DIAGNOSIS — Z801 Family history of malignant neoplasm of trachea, bronchus and lung: Secondary | ICD-10-CM | POA: Insufficient documentation

## 2023-10-02 DIAGNOSIS — Z5181 Encounter for therapeutic drug level monitoring: Secondary | ICD-10-CM

## 2023-10-02 DIAGNOSIS — Z87891 Personal history of nicotine dependence: Secondary | ICD-10-CM

## 2023-10-02 DIAGNOSIS — C50412 Malignant neoplasm of upper-outer quadrant of left female breast: Secondary | ICD-10-CM | POA: Diagnosis not present

## 2023-10-02 DIAGNOSIS — R32 Unspecified urinary incontinence: Secondary | ICD-10-CM | POA: Insufficient documentation

## 2023-10-02 DIAGNOSIS — Z79810 Long term (current) use of selective estrogen receptor modulators (SERMs): Secondary | ICD-10-CM | POA: Diagnosis not present

## 2023-10-02 DIAGNOSIS — Z79899 Other long term (current) drug therapy: Secondary | ICD-10-CM | POA: Diagnosis not present

## 2023-10-02 DIAGNOSIS — M069 Rheumatoid arthritis, unspecified: Secondary | ICD-10-CM | POA: Insufficient documentation

## 2023-10-02 DIAGNOSIS — Z809 Family history of malignant neoplasm, unspecified: Secondary | ICD-10-CM | POA: Insufficient documentation

## 2023-10-02 NOTE — Progress Notes (Signed)
 [facility] CONSULT NOTE  Patient Care Team: Vicci Barnie NOVAK, MD as PCP - General (Internal Medicine) Curvin Deward MOULD, MD as Consulting Physician (General Surgery) Dolphus Reiter, MD as Consulting Physician (Rheumatology) Stacia Arlean BROCKS, LCSW (Inactive) as Social Worker (General Practice) Cheryl Waddell CHRISTELLA DEVONNA as Physician Assistant (Physician Assistant) Rennie Cindy SAUNDERS, MD as Consulting Physician (Oncology)  CHIEF COMPLAINTS/PURPOSE OF CONSULTATION: Breast cancer  #  Oncology History Overview Note  2021- FINAL MICROSCOPIC DIAGNOSIS:   A. BREAST, LEFT, LUMPECTOMY:  - Invasive ductal carcinoma, 1.5 cm.  - Ductal carcinoma in situ with calcifications.  - Invasive carcinoma 0.4 cm from posterior margin.  - DCIS focally involves inferior margin.  - Fibrocystic changes with sclerosing adenosis and calcifications.  - See oncology table.   B. LYMPH NODE, LEFT AXILLARY, SENTINEL, BIOPSY:  - One lymph node with no metastatic carcinoma (0/1).   C. LYMPH NODE, LEFT AXILLARY, SENTINEL, BIOPSY:  - One lymph node with no metastatic carcinoma (0/1).   D. LYMPH NODE, LEFT AXILLARY, SENTINEL, BIOPSY:  - One lymph node with no metastatic carcinoma (0/1).   E. LYMPH NODE, LEFT AXILLARY, SENTINEL, BIOPSY:  - One lymph node with no metastatic carcinoma (0/1).   F. BREAST, LEFT ADDITIONAL MEDIAL MARGIN, EXCISION:  - Fibrocystic changes with sclerosing adenosis and calcifications.  - No malignancy identified.  - Final medial margin clear.   G. BREAST, LEFT ADDITIONAL SUPERIOR MARGIN, EXCISION:  - Fibrocystic changes with sclerosing adenosis and calcifications.  - No malignancy identified.  - Final superior margin clear.   ONCOLOGY TABLE:  INVASIVE CARCINOMA OF THE BREAST:  Resection  Procedure: Left breast lumpectomy, 4 left axillary sentinel lymph nodes  with additional medial margin and additional superior margin biopsies.  Specimen Laterality: Left breast.   Tumor Size: 1.5 cm.  Histologic Type: Ductal.  Histologic Grade:       Glandular (Acinar)/Tubular Differentiation: 1.       Nuclear Pleomorphism: 1.       Mitotic Rate: 1.       Overall Grade: 1  Ductal Carcinoma In Situ: Present.  Margins: Free of invasive carcinoma.       Distance from closest margin (millimeters): 4 mm.       Specify closest margin (required only if <43mm): Posterior.  DCIS Margins: Focally involves inferior margin.       Distance from closest margin (millimeters): 0 mm.       Specify closest margin (required only if <21mm): Inferior.  Regional Lymph Nodes:       Number of Lymph Nodes Examined: 4       Number of Sentinel Nodes Examined (if applicable): 4       Number of Lymph Nodes with Macrometastases (>2 mm): 0       Number of Lymph Nodes with Micrometastases: 0       Number of Lymph Nodes with Isolated Tumor Cells (=0.2 mm or =200  cells): 0  Treatment Effect:  No known presurgical therapy  Breast Biomarker Testing Performed on Previous Biopsy:        Testing Performed on Case Number: [id number].             Estrogen Receptor: 95%, positive, strong staining.             Progesterone Receptor: 2%, positive, strong staining.             HER2: Negative with IHC.  Ki-67: 1%.    [name]  06/13/2019 Cancer Staging   Staging form: Breast, AJCC 8th Edition - Clinical stage from 06/13/2019: Stage 0 (cTis (DCIS), cN0, cM0, ER+, PR+)   06/15/2019 Initial Diagnosis   [name]   Malignant neoplasm of upper-outer quadrant of left breast in female, estrogen receptor positive (HCC)  09/15/2019 Initial Diagnosis   Malignant neoplasm of upper-outer quadrant of left breast in female, estrogen receptor positive (HCC)     HISTORY OF PRESENTING ILLNESS: Alone.  Ambulating independently. [name] 63 y.o. female with a history of LEFT breast cancer stage I  ER/PR positive HER2 negative;  history of smoking; longstanding anxiety/depression here for follow-up/review.  Patient denies any new onset of any lumps or bumps.  Denies any unusual shortness of breath or cough. Tolerating tamoxifen  well without significant side effects. She complains of incontinence which is chronic. Not worse.   Review of Systems  Constitutional:  Positive for malaise/fatigue. Negative for chills, diaphoresis, fever and weight loss.  HENT:  Negative for nosebleeds and sore throat.   Respiratory:  Negative for cough, hemoptysis, sputum production, shortness of breath and wheezing.   Cardiovascular:  Negative for chest pain, palpitations, orthopnea and leg swelling.  Gastrointestinal:  Negative for abdominal pain, blood in stool, constipation, diarrhea, heartburn, melena, nausea and vomiting.  Genitourinary:  Negative for dysuria, flank pain, frequency, hematuria and urgency.  Musculoskeletal:  Positive for back pain, joint pain and neck pain. Negative for falls.  Skin: Negative.  Negative for itching and rash.  Neurological:  Negative for dizziness, tingling, focal weakness, weakness and headaches.  Endo/Heme/Allergies:  Does not bruise/bleed easily.  Psychiatric/Behavioral:  Negative for depression. The patient is nervous/anxious. The patient does not have insomnia.     MEDICAL HISTORY:  Past Medical History:  Diagnosis Date   Anemia    during pregnancy   Anxiety    Asthma    Bronchitis age 54   Cancer (HCC)    left breast ca   COPD (chronic obstructive pulmonary disease) (HCC)    Depression    Hx of bladder infections    been over 5 years since last one    Malignant neoplasm of upper-outer quadrant of left breast in female, estrogen receptor positive (HCC)    Rheumatoid arthritis (HCC)    unc rheum    Tobacco abuse    Trouble in sleeping    SURGICAL HISTORY: Past Surgical History:  Procedure Laterality Date   BREAST LUMPECTOMY WITH RADIOACTIVE SEED AND SENTINEL LYMPH NODE BIOPSY Left  08/30/2019   Procedure: LEFT BREAST LUMPECTOMY X 2 WITH RADIOACTIVE SEED AND SENTINEL LYMPH NODE BIOPSY;  Surgeon: Curvin Deward MOULD, MD;  Location: [facility] OR;  Service: General;  Laterality: Left;  PEC BLOCK   COLONOSCOPY WITH PROPOFOL  N/A 03/15/2019   Procedure: COLONOSCOPY WITH PROPOFOL ;  Surgeon: Therisa Bi, MD;  Location: [facility] ENDOSCOPY;  Service: Gastroenterology;  Laterality: N/A;   COLONOSCOPY WITH PROPOFOL  N/A 07/29/2022   Procedure: COLONOSCOPY WITH PROPOFOL ;  Surgeon: Therisa Bi, MD;  Location: [facility] ENDOSCOPY;  Service: Gastroenterology;  Laterality: N/A;   HIP ARTHROPLASTY     JOINT REPLACEMENT     MULTIPLE TOOTH EXTRACTIONS     RE-EXCISION OF BREAST LUMPECTOMY Left 10/07/2019   Procedure: RE-EXCISION OF LEFT BREAST INFERIOR MARGIN;  Surgeon: Curvin Deward MOULD, MD;  Location: [facility];  Service: General;  Laterality: Left;   TOTAL HIP ARTHROPLASTY Left  02/15/2018   Procedure: TOTAL HIP ARTHROPLASTY ANTERIOR APPROACH;  Surgeon: Liam Lerner, MD;  Location: [facility] ORS;  Service: Orthopedics;  Laterality: Left;   WISDOM TOOTH EXTRACTION     SOCIAL HISTORY: Social History   Socioeconomic History   Marital status: Single    Spouse name: Not on file   Number of children: 1   Years of education: 14 years   Highest education level: Some college, no degree  Occupational History   Not on file  Tobacco Use   Smoking status: Former    Current packs/day: 0.00    Average packs/day: 1 pack/day for 40.0 years (40.0 ttl pk-yrs)    Types: Cigarettes    Start date: 03/10/1981    Quit date: 03/10/2021    Years since quitting: 2.5    Passive exposure: Past   Smokeless tobacco: Never   Tobacco comments:    on nicotine  patches  Vaping Use   Vaping status: Never Used  Substance and Sexual Activity   Alcohol use: Yes    Comment: Occassionally   Drug use: Not Currently    Types: Cocaine    Comment: it's been years ago   Sexual activity: Not Currently    Birth control/protection:  Post-menopausal  Other Topics Concern   Not on file  Social History Narrative   Not on file   Social Drivers of Health   Financial Resource Strain: Low Risk  (09/15/2023)   Overall Financial Resource Strain (CARDIA)    Difficulty of Paying Living Expenses: Not very hard  Food Insecurity: No Food Insecurity (09/15/2023)   Hunger Vital Sign    Worried About Running Out of Food in the Last Year: Never true    Ran Out of Food in the Last Year: Never true  Transportation Needs: No Transportation Needs (09/15/2023)   PRAPARE - Administrator, Civil Service (Medical): No    Lack of Transportation (Non-Medical): No  Physical Activity: Sufficiently Active (09/15/2023)   Exercise Vital Sign    Days of Exercise per Week: 4 days    Minutes of Exercise per Session: 60 min  Stress: No Stress Concern Present (09/15/2023)   [employer] of Occupational Health - Occupational Stress Questionnaire    Feeling of Stress: Not at all  Social Connections: Moderately Integrated (09/15/2023)   Social Connection and Isolation Panel    Frequency of Communication with Friends and Family: More than three times a week    Frequency of Social Gatherings with Friends and Family: More than three times a week    Attends Religious Services: 1 to 4 times per year    Active Member of [employer] or Organizations: No    Attends Engineer, structural: More than 4 times per year    Marital Status: Widowed  Intimate Partner Violence: Not At Risk (09/15/2023)   Humiliation, Afraid, Rape, and Kick questionnaire    Fear of Current or Ex-Partner: No    Emotionally Abused: No    Physically Abused: No    Sexually Abused: No   FAMILY HISTORY: Family History  Problem Relation Age of Onset   Breast cancer Mother 66       metastatic   Cervical cancer Mother    Lung cancer Brother        maternal half brother; d. 20s   Lung cancer Maternal Uncle        d. 9s   Lung cancer Maternal Uncle        d. 78  Cancer  Maternal Grandmother        unknown type; dx 50s   Healthy Son    Breast cancer Maternal Great-grandmother        dx unknown age   ALLERGIES:  is allergic to trazodone  and nefazodone and other.  MEDICATIONS:  Current Outpatient Medications  Medication Sig Dispense Refill   albuterol  (VENTOLIN  HFA) 108 (90 Base) MCG/ACT inhaler TAKE 2 PUFFS BY MOUTH EVERY 6 HOURS AS NEEDED FOR WHEEZE OR SHORTNESS OF BREATH 18 g 0   benzonatate  (TESSALON ) 100 MG capsule Take 1 capsule (100 mg total) by mouth 2 (two) times daily as needed for cough. 20 capsule 0   budesonide -glycopyrrolate -formoterol  (BREZTRI  AEROSPHERE) 160-9-4.8 MCG/ACT AERO inhaler Inhale 2 puffs into the lungs 2 (two) times daily. 10.7 g 11   Calcium Carb-Cholecalciferol  (CALCIUM 1000 + D PO) Take by mouth.     COLLAGEN PO Take by mouth daily.     cyanocobalamin  100 MCG tablet Take 100 mcg by mouth daily.     etanercept  (ENBREL  MINI) 50 MG/ML injection Inject 1 mL (50 mg total) into the skin once a week. 12 mL 0   folic acid  (FOLVITE ) 1 MG tablet Take 2 tablets (2 mg total) by mouth daily. 180 tablet 3   hydrOXYzine  (VISTARIL ) 25 MG capsule Take 1 capsule (25 mg total) by mouth at bedtime. 30 capsule 3   methotrexate  (RHEUMATREX) 2.5 MG tablet Take 8 tablets (20 mg total) by mouth once a week. Caution:Chemotherapy. Protect from light. (Patient not taking: Reported on 09/02/2023) 32 tablet 2   Methotrexate , PF, (OTREXUP ) 20 MG/0.4ML SOAJ Inject 20 mg into the skin once a week. 1.6 mL 0   Multiple Vitamin (MULTIVITAMIN WITH MINERALS) TABS tablet Take 1 tablet by mouth daily.     nicotine  (NICODERM CQ  - DOSED IN MG/24 HR) 7 mg/24hr patch Place 1 patch (7 mg total) onto the skin daily. Please schedule appt with PCP for additional refills. 28 patch 0   OHTUVAYRE  3 MG/2.5ML SUSP      tamoxifen  (NOLVADEX ) 20 MG tablet Take 1 tablet (20 mg total) by mouth daily. 90 tablet 1   VITAMIN D  PO Take by mouth daily.     No current facility-administered  medications for this visit.   PHYSICAL EXAMINATION: There were no vitals filed for this visit.  Filed Weights   10/02/23 1430  Weight: 110 lb (49.9 kg)   Physical Exam Vitals reviewed.  Cardiovascular:     Rate and Rhythm: Normal rate and regular rhythm.  Pulmonary:     Comments: Decreased breath sounds bilaterally.  Abdominal:     General: There is no distension.     Palpations: Abdomen is soft.  Musculoskeletal:        General: No deformity.  Lymphadenopathy:     Cervical: No cervical adenopathy.  Skin:    General: Skin is warm.     Coloration: Skin is not pale.  Neurological:     Mental Status: She is alert and oriented to person, place, and time.  Psychiatric:        Mood and Affect: Mood normal.        Behavior: Behavior normal.    LABORATORY DATA:  No labs on site today  RADIOGRAPHIC STUDIES: I have personally reviewed the radiological images as listed and agreed with the findings in the report. ECHOCARDIOGRAM COMPLETE Result Date: 09/23/2023    ECHOCARDIOGRAM REPORT   Patient Name:   [name] Date of Exam: 09/23/2023 Medical  Rec #:  [id number]    Height:       65.0 in Accession #:    [id number]   Weight:       115.0 lb Date of Birth:  [birth year]    BSA:          1.564 m Patient Age:    62 years     BP:           103/70 mmHg Patient Gender: F            HR:           90 bpm. Exam Location:  [facility] Procedure: 2D Echo, Cardiac Doppler and Color Doppler (Both Spectral and Color            Flow Doppler were utilized during procedure). Indications:     Dyspnea on exertion R06.09                  Dyspnea R06.00  History:         Patient has no prior history of Echocardiogram examinations.                  COPD. Tobacco abuse.  Sonographer:     Christopher Furnace Referring Phys:  [id number] DARRIN BARN Diagnosing Phys: Evalene Lunger MD  Sonographer Comments: Technically challenging study due to limited acoustic windows and no apical window. Image acquisition challenging due to  COPD. IMPRESSIONS  1. Left ventricular ejection fraction, by estimation, is 60 to 65%. The left ventricle has normal function. The left ventricle has no regional wall motion abnormalities. Left ventricular diastolic parameters are indeterminate.  2. Right ventricular systolic function is normal. The right ventricular size is normal.  3. The mitral valve is normal in structure. No evidence of mitral valve regurgitation. No evidence of mitral stenosis.  4. The aortic valve is normal in structure. Aortic valve regurgitation is not visualized. Aortic valve sclerosis is present, with no evidence of aortic valve stenosis.  5. The inferior vena cava is normal in size with greater than 50% respiratory variability, suggesting right atrial pressure of 3 mmHg. FINDINGS  Left Ventricle: Left ventricular ejection fraction, by estimation, is 60 to 65%. The left ventricle has normal function. The left ventricle has no regional wall motion abnormalities. Strain was performed and the global longitudinal strain is indeterminate. The left ventricular internal cavity size was normal in size. There is no left ventricular hypertrophy. Left ventricular diastolic parameters are indeterminate. Right Ventricle: The right ventricular size is normal. No increase in right ventricular wall thickness. Right ventricular systolic function is normal. Left Atrium: Left atrial size was normal in size. Right Atrium: Right atrial size was normal in size. Pericardium: There is no evidence of pericardial effusion. Mitral Valve: The mitral valve is normal in structure. No evidence of mitral valve regurgitation. No evidence of mitral valve stenosis. Tricuspid Valve: The tricuspid valve is normal in structure. Tricuspid valve regurgitation is not demonstrated. No evidence of tricuspid stenosis. Aortic Valve: The aortic valve is normal in structure. Aortic valve regurgitation is not visualized. Aortic valve sclerosis is present, with no evidence of aortic  valve stenosis. Pulmonic Valve: The pulmonic valve was normal in structure. Pulmonic valve regurgitation is not visualized. No evidence of pulmonic stenosis. Aorta: The aortic root is normal in size and structure. Venous: The inferior vena cava is normal in size with greater than 50% respiratory variability, suggesting right atrial pressure of 3 mmHg. IAS/Shunts: No atrial level shunt detected by color flow  Doppler. Additional Comments: 3D was performed not requiring image post processing on an independent workstation and was indeterminate.  LEFT VENTRICLE PLAX 2D LVIDd:         3.50 cm LVIDs:         2.40 cm LV PW:         1.00 cm LV IVS:        0.80 cm LVOT diam:     2.00 cm LVOT Area:     3.14 cm  LEFT ATRIUM         Index LA diam:    1.90 cm 1.22 cm/m   AORTA Ao Root diam: 2.90 cm  SHUNTS Systemic Diam: 2.00 cm Evalene Lunger MD Electronically signed by Evalene Lunger MD Signature Date/Time: 09/23/2023/2:45:53 PM    Final    ASSESSMENT & PLAN:   Malignant neoplasm of upper-outer quadrant of left breast in female, estrogen receptor positive   # stage I breast cancer-left breast- ER/PR positive HER2 negative status postlumpectomy followed by radiation.  No chemotherapy. On adjuvant-tamoxifen  20 mg qhs [NOV, 01/2021- NOV 2027].  Patient tolerated tamoxifen  well.  Stable. DEC 2023-  mammo- BIL- WNL.   # Incidental left axillary lymphadenopathy [on lung cancer screening]; UNABLE to perform biopsy left axilla lymph node [MAY/ June 12th, 2023] because of blood vessels. JUNE 2024- US  Left underarm stable-  Recommend BILATERAL diagnostic mammogram and LEFT axillary ultrasound in 6 month. Repeat mammo 03/26/23 was bi-rads 2: benign. Plan to repeat in Jan 2026 with ultrasound.    # Lung cancer screening: SEP 2023- Bi Rad 2- continue follow up in 12 months/PCP- stable. Repeat 02/2023 was lung rads 2: benign. Plan to repeat with LDCT program Dec 2025.    # Rheumatoid arthritis: [Dr.Deweshwer; [place]]-methotrexate .  Biologics  # Incontinence- ref to uro gyn for evaluation and management- la  # Bone Health- on tamoxifen . Plan for baseline bone density exam but discussed that tamoxifen  may help improve her bone health.    DISPOSITION: January mammogram & bone density Week later- see Dr Rennie week later for surveillance- la  No problem-specific Assessment & Plan notes found for this encounter.  All questions were answered. The patient/family knows to call the clinic with any problems, questions or concerns.  Tinnie KANDICE Dawn, NP 10/02/2023

## 2023-10-05 ENCOUNTER — Other Ambulatory Visit: Payer: Self-pay | Admitting: Rheumatology

## 2023-10-05 ENCOUNTER — Other Ambulatory Visit: Payer: Self-pay

## 2023-10-05 DIAGNOSIS — M9901 Segmental and somatic dysfunction of cervical region: Secondary | ICD-10-CM | POA: Diagnosis not present

## 2023-10-05 DIAGNOSIS — M542 Cervicalgia: Secondary | ICD-10-CM | POA: Diagnosis not present

## 2023-10-05 DIAGNOSIS — M9902 Segmental and somatic dysfunction of thoracic region: Secondary | ICD-10-CM | POA: Diagnosis not present

## 2023-10-05 DIAGNOSIS — M546 Pain in thoracic spine: Secondary | ICD-10-CM | POA: Diagnosis not present

## 2023-10-05 DIAGNOSIS — M0579 Rheumatoid arthritis with rheumatoid factor of multiple sites without organ or systems involvement: Secondary | ICD-10-CM

## 2023-10-05 MED ORDER — OTREXUP 20 MG/0.4ML ~~LOC~~ SOAJ
20.0000 mg | SUBCUTANEOUS | 0 refills | Status: DC
Start: 2023-10-05 — End: 2023-11-25
  Filled 2023-10-05: qty 1.6, 28d supply, fill #0
  Filled 2023-11-13: qty 3.2, 56d supply, fill #1
  Filled 2023-11-23: qty 1.6, 28d supply, fill #1

## 2023-10-05 NOTE — Telephone Encounter (Signed)
 Last Fill: 08/28/2023  Labs: 09/02/2023 CBC and CMP WNL   Next Visit: 01/25/2024  Last Visit: 09/02/2023  DX: Rheumatoid arthritis involving multiple sites with positive rheumatoid factor   Current Dose per office note 09/02/2023: Otrexup  20 mg sq injections once weekly   Okay to refill Otrexup ?

## 2023-10-05 NOTE — Progress Notes (Signed)
 Specialty Pharmacy Refill Coordination Note  [name] is a 63 y.o. female contacted today regarding refills of specialty medication(s) Etanercept  (Enbrel  Mini)   Patient requested Delivery   Delivery date: 10/09/23   Verified address: [address] [place]  [address]   Medication will be filled on 07.31.25.

## 2023-10-06 ENCOUNTER — Encounter

## 2023-10-07 ENCOUNTER — Other Ambulatory Visit: Payer: Self-pay

## 2023-10-08 ENCOUNTER — Encounter

## 2023-10-12 ENCOUNTER — Telehealth: Payer: Self-pay

## 2023-10-12 NOTE — Telephone Encounter (Signed)
 Copied from CRM [id number]. Topic: Clinical - Prescription Issue >> Oct 12, 2023  1:29 PM Zebedee SAUNDERS wrote: Reason for CRM: Pt stated she would like her Tramadol  refilled for her arthritis pain. I don't see it on Medication List. Please call pt at [phone]. - [facility] [address] [place] [address] Phone: [phone] Fax: [phone]

## 2023-10-13 NOTE — Telephone Encounter (Signed)
 Will need to be addressed by Dr. Sheryle when she returns next week as Dr. Vicci is not currently prescribing her tramadol .

## 2023-10-19 DIAGNOSIS — M542 Cervicalgia: Secondary | ICD-10-CM | POA: Diagnosis not present

## 2023-10-19 DIAGNOSIS — M546 Pain in thoracic spine: Secondary | ICD-10-CM | POA: Diagnosis not present

## 2023-10-19 DIAGNOSIS — M9902 Segmental and somatic dysfunction of thoracic region: Secondary | ICD-10-CM | POA: Diagnosis not present

## 2023-10-19 DIAGNOSIS — M9901 Segmental and somatic dysfunction of cervical region: Secondary | ICD-10-CM | POA: Diagnosis not present

## 2023-10-19 NOTE — Telephone Encounter (Signed)
 To be addressed on next visit.

## 2023-10-19 NOTE — Telephone Encounter (Signed)
 Called but no answer. LVM to call back.

## 2023-10-20 ENCOUNTER — Other Ambulatory Visit: Payer: Self-pay

## 2023-10-20 ENCOUNTER — Other Ambulatory Visit (HOSPITAL_COMMUNITY): Payer: Self-pay

## 2023-10-20 MED ORDER — PREDNISONE 5 MG PO TABS
ORAL_TABLET | ORAL | 0 refills | Status: AC
  Filled 2023-10-20: qty 40, 16d supply, fill #0

## 2023-10-20 NOTE — Addendum Note (Signed)
 Addended by: Alixander Rallis M on: 10/20/2023 02:43 PM   Modules accepted: Orders

## 2023-10-20 NOTE — Telephone Encounter (Signed)
 Please review and sign

## 2023-10-20 NOTE — Telephone Encounter (Signed)
 Patient called the office stating she is having pain in her shoulder and would like a prescription of prednisone  sent. Stated it got really bad last night and nothing is helping it. Please advise.

## 2023-10-20 NOTE — Telephone Encounter (Signed)
 Okay to send in prednisone  20 mg tapering by 5 mg every 4 days.  Take prednisone  in the morning with food and avoid the use of NSAIDs.

## 2023-10-20 NOTE — Telephone Encounter (Signed)
 Called but no answer. LVM informing that all questions and concerns will be addressed at upcoming visit.

## 2023-10-26 ENCOUNTER — Telehealth: Payer: Self-pay | Admitting: Internal Medicine

## 2023-10-26 ENCOUNTER — Other Ambulatory Visit: Payer: Self-pay

## 2023-10-26 NOTE — Telephone Encounter (Signed)
 Confirmed appt for 8/19

## 2023-10-27 ENCOUNTER — Other Ambulatory Visit (HOSPITAL_COMMUNITY): Payer: Self-pay

## 2023-10-27 ENCOUNTER — Ambulatory Visit: Attending: Internal Medicine | Admitting: Internal Medicine

## 2023-10-27 VITALS — BP 112/72 | HR 85 | Ht 65.0 in | Wt 114.0 lb

## 2023-10-27 DIAGNOSIS — Z79620 Long term (current) use of immunosuppressive biologic: Secondary | ICD-10-CM

## 2023-10-27 DIAGNOSIS — Z23 Encounter for immunization: Secondary | ICD-10-CM

## 2023-10-27 DIAGNOSIS — M25511 Pain in right shoulder: Secondary | ICD-10-CM | POA: Diagnosis not present

## 2023-10-27 DIAGNOSIS — Z79631 Long term (current) use of antimetabolite agent: Secondary | ICD-10-CM

## 2023-10-27 DIAGNOSIS — M0579 Rheumatoid arthritis with rheumatoid factor of multiple sites without organ or systems involvement: Secondary | ICD-10-CM

## 2023-10-27 DIAGNOSIS — Z79810 Long term (current) use of selective estrogen receptor modulators (SERMs): Secondary | ICD-10-CM

## 2023-10-27 DIAGNOSIS — J449 Chronic obstructive pulmonary disease, unspecified: Secondary | ICD-10-CM

## 2023-10-27 DIAGNOSIS — G8929 Other chronic pain: Secondary | ICD-10-CM

## 2023-10-27 DIAGNOSIS — F1411 Cocaine abuse, in remission: Secondary | ICD-10-CM

## 2023-10-27 DIAGNOSIS — Z853 Personal history of malignant neoplasm of breast: Secondary | ICD-10-CM

## 2023-10-27 DIAGNOSIS — Z87891 Personal history of nicotine dependence: Secondary | ICD-10-CM | POA: Diagnosis not present

## 2023-10-27 NOTE — Patient Instructions (Signed)
  VISIT SUMMARY: [name], a 63 year old female with rheumatoid arthritis and COPD, visited for pain management of her right shoulder. She has ongoing issues with her shoulder, which has been evaluated by orthopedics, and surgery has been suggested but is complicated. She experiences intermittent severe pain that disrupts her sleep and ability to exercise. Her rheumatoid arthritis is managed with methotrexate  and Enbrel , and she is currently on prednisone  for a flare-up. She also has COPD, managed with Breztri  and a nebulizer, and has quit smoking six months ago. She has a history of breast cancer and is on tamoxifen  to prevent recurrence. 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 You should discuss alternative pain medications like meloxicam or Celebrex  with your rheumatologist and consider using Tylenol  Extra Strength for pain relief. Avoid regular use of ibuprofen  due to interaction risks with your current medications.  -RHEUMATOID ARTHRITIS: Rheumatoid arthritis is an autoimmune disease causing inflammation in your joints. Continue taking methotrexate  and Enbrel  as prescribed. Monitor for flare-ups and manage them with prednisone  as needed.  -CHRONIC OBSTRUCTIVE PULMONARY DISEASE (COPD): COPD is a chronic lung disease that makes it hard to breathe. Continue using Breztri  and your nebulizer as prescribed. Attend your consultation for the bronchial implant procedure on September 17th, which may help improve your oxygen absorption. Keep exercising regularly as tolerated.  -FATIGUE: Fatigue is a feeling of extreme tiredness that can affect your daily activities. Regular exercise as tolerated can help  improve your energy levels.  -BREAST CANCER, ON TAMOXIFEN  FOR RECURRENCE PREVENTION: You are taking tamoxifen  to prevent the recurrence of breast cancer. Continue taking tamoxifen  as prescribed. Schedule your lung cancer screening CT in November and your breast exam in January.  -NICOTINE  DEPENDENCE, IN SUSTAINED REMISSION: You have successfully quit smoking for six months. Continue to abstain from smoking and use nicotine  patches as needed for cravings.  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 Continue to support your abstinence.  -GENERAL HEALTH MAINTENANCE: You are due for flu and pneumonia vaccines. Get the flu vaccine when it becomes available and the pneumonia vaccine (Pneumovax 23) if available, or coordinate with your upcoming appointments.  INSTRUCTIONS: Attend your consultation for the bronchial implant procedure on September 17th. Schedule your lung cancer screening CT in November and your breast exam in January. Get the flu vaccine when it becomes available and  the pneumonia vaccine (Pneumovax 23) if available, or coordinate with your upcoming appointments.                      Contains text generated by [employer].                                 Contains text generated by [employer]bridge.

## 2023-10-27 NOTE — Progress Notes (Signed)
 Patient ID: [name], female    DOB: 08-20-1960  MRN: [id number]  CC: Follow-up (Follow-up. Layvonne tramadol  rx for intermittent R shoulder)   Subjective: [name] is a 63 y.o. female who presents for chronic ds management. Her concerns today include:  Patient with history of COPD, pulmonary nodules (needs repeat CT 01/2023 ), rheumatoid arthritis, arthritis RT shoulder, tob dep, THR, anxiety/depression, stress incontinence. 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  Status postlumpectomy followed by radiation.  No chemotherapy.  On adjuvant-tamoxifen  20 mg qhs [NOV, 01/2021].    Discussed the use of AI scribe software for clinical note transcription with the patient, who gave verbal consent to proceed.  History of Present Illness   [name] is a 63 year old female with rheumatoid arthritis and COPD who presents for pain management of her right shoulder.  She has severe RA in right shoulder, which has been evaluated by orthopedics. Surgery has been suggested but is complicated due to the need for custom parts. She last saw her orthopedic specialist approximately six months ago and reported that she told her her condition was basically the same and that she should try to hold out on getting surgery for as long as she can. She experiences intermittent pain not ever day, which can be severe and disrupts her sleep and ability to exercise.  She had called a week or 2 ago requesting to be placed on tramadol .  States she was having increased pain at that time she thinks due to the rainy weather.  She has been using ibuprofen  for pain relief despite being advised against it due to her current medications, Enbrel  and methotrexate . She is currently on prednisone  for a flare-up.  RA: Her rheumatoid arthritis is managed with methotrexate  and Enbrel , with the latter being administered as a 50 mg weekly injection. She experiences fatigue and occasional flare-ups, which are  managed with prednisone .  COPD: She has COPD and uses Breztri  and a nebulizer medication once daily, which she finds effective. She quit smoking six months ago and exercises regularly, aiming for at least three to four days a week. She is due for a lung cancer screening CT scan in November.  She now sees a pulmonologist with [facility] in [place]. She has been referred to the [facility] and will see them next month to discuss whether she would be a good candidate for procedure called Cephar.  She has a history of RT breast cancer and is currently on tamoxifen  to help prevent recurrence. Her recent cancer screening earlier this yr was okay.    No current issues with depression but experiences significant fatigue. 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 She was previously homeless but now has stable housing with a roommate.   HM: She is due for Prevnar 23.  Also due for flu vaccine which I advised that she gets once it becomes available the end of this month or the early part of next month.     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   Adenomatous colon  polyp 03/16/2019   Degenerative joint disease of shoulder region 06/08/2018   Rheumatoid arthritis involving both hands with positive rheumatoid factor (HCC) 06/08/2018   Rheumatoid arthritis involving both feet with positive rheumatoid factor (HCC) 06/08/2018   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   Status post total hip replacement, 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   Smoker 06/08/2018   Anxiety and depression 06/08/2018   Family history of breast  cancer 03/25/2018   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 10.7 g 11   Calcium Carb-Cholecalciferol  (CALCIUM 1000 + D PO) Take by mouth.     COLLAGEN PO Take by mouth daily.     cyanocobalamin  100 MCG tablet Take 100 mcg by mouth daily.     etanercept  (ENBREL  MINI) 50 MG/ML injection Inject 1 mL (50 mg total) into the skin once a week. 12 mL 0   folic acid  (FOLVITE ) 1 MG tablet Take 2 tablets (2 mg total) by mouth daily. 180 tablet 3   Methotrexate , PF, (OTREXUP ) 20 MG/0.4ML SOAJ Inject 20 mg into the skin once a week. 4.8 mL 0   Multiple Vitamin (MULTIVITAMIN WITH MINERALS) TABS tablet Take 1 tablet by mouth daily.     nicotine  (NICODERM CQ  - DOSED IN MG/24 HR) 7 mg/24hr patch Place 1 patch (7 mg total) onto the skin daily. Please schedule appt with PCP for additional refills. 28 patch 0   OHTUVAYRE  3 MG/2.5ML SUSP      predniSONE  (DELTASONE ) 5 MG tablet Take 4 tabs (20 mg total) by mouth daily for 4 days, THEN 3 tabs for 4 days, THEN 2 tabs for 4 days, THEN 1 tab for 4 days. Take in the morning with breakfast. Do not take any NSAIDS. 40 tablet 0   tamoxifen  (NOLVADEX ) 20 MG tablet Take 1 tablet (20 mg total) by mouth daily. 90 tablet 1   VITAMIN D  PO Take by mouth daily.     benzonatate  (TESSALON ) 100 MG capsule Take 1 capsule (100 mg total) by mouth 2 (two) times daily as needed for cough. (Patient not taking: Reported on  10/27/2023) 20 capsule 0   hydrOXYzine  (VISTARIL ) 25 MG capsule Take 1 capsule (25 mg total) by mouth at bedtime. 30 capsule 3   methotrexate  (RHEUMATREX) 2.5 MG tablet Take 8 tablets (20 mg total) by mouth once a week. Caution:Chemotherapy. Protect from light. 32 tablet 2   No current facility-administered medications on file prior to visit.    Allergies  Allergen Reactions   Trazodone  And Nefazodone Other (See Comments)    Did not work for her.   Other Rash    Unknown cleaner used in OR    Social History   Socioeconomic History   Marital status: Single    Spouse name: Not on file   Number of children: 1   Years of education: 14 years   Highest education level: Associate degree: occupational, Scientist, product/process development, or vocational program  Occupational History   Not on file  Tobacco Use  Smoking status: Former    Current packs/day: 0.00    Average packs/day: 1 pack/day for 40.0 years (40.0 ttl pk-yrs)    Types: Cigarettes    Start date: 03/10/1981    Quit date: 03/10/2021    Years since quitting: 2.6    Passive exposure: Past   Smokeless tobacco: Never   Tobacco comments:    on nicotine  patches  Vaping Use   Vaping status: Never Used  Substance and Sexual Activity   Alcohol use: Yes    Comment: Occassionally   Drug use: Not Currently    Types: Cocaine    Comment: it's been years ago   Sexual activity: Not Currently    Birth control/protection: Post-menopausal  Other Topics Concern   Not on file  Social History Narrative   Not on file   Social Drivers of Health   Financial Resource Strain: Medium Risk (10/27/2023)   Overall Financial Resource Strain (CARDIA)    Difficulty of Paying Living Expenses: Somewhat hard  Food Insecurity: Food Insecurity Present (10/27/2023)   Hunger Vital Sign    Worried About Running Out of Food in the Last Year: Sometimes true    Ran Out of Food in the Last Year: Sometimes true  Transportation Needs: No Transportation Needs (10/27/2023)   PRAPARE  - Administrator, Civil Service (Medical): No    Lack of Transportation (Non-Medical): No  Physical Activity: Sufficiently Active (09/15/2023)   Exercise Vital Sign    Days of Exercise per Week: 4 days    Minutes of Exercise per Session: 60 min  Stress: No Stress Concern Present (09/15/2023)   [employer] of Occupational Health - Occupational Stress Questionnaire    Feeling of Stress: Not at all  Social Connections: Moderately Integrated (10/27/2023)   Social Connection and Isolation Panel    Frequency of Communication with Friends and Family: Three times a week    Frequency of Social Gatherings with Friends and Family: Once a week    Attends Religious Services: More than 4 times per year    Active Member of [employer] or Organizations: Yes    Attends Banker Meetings: Not on file    Marital Status: Widowed  Intimate Partner Violence: Not At Risk (09/15/2023)   Humiliation, Afraid, Rape, and Kick questionnaire    Fear of Current or Ex-Partner: No    Emotionally Abused: No    Physically Abused: No    Sexually Abused: No    Family History  Problem Relation Age of Onset   Breast cancer Mother 64       metastatic   Cervical cancer Mother    Lung cancer Brother        maternal half brother; d. 53s   Lung cancer Maternal Uncle        d. 36s   Lung cancer Maternal Uncle        d. 71   Cancer Maternal Grandmother        unknown type; dx 74s   Healthy Son    Breast cancer Maternal Great-grandmother        dx unknown age    Past Surgical History:  Procedure Laterality Date   BREAST LUMPECTOMY WITH RADIOACTIVE SEED AND SENTINEL LYMPH NODE BIOPSY Left 08/30/2019   Procedure: LEFT BREAST LUMPECTOMY X 2 WITH RADIOACTIVE SEED AND SENTINEL LYMPH NODE BIOPSY;  Surgeon: Curvin Deward MOULD, MD;  Location: [facility] OR;  Service: General;  Laterality: Left;  PEC BLOCK   COLONOSCOPY WITH PROPOFOL  N/A  03/15/2019   Procedure: COLONOSCOPY WITH PROPOFOL ;  Surgeon: Therisa Bi, MD;   Location: [facility] ENDOSCOPY;  Service: Gastroenterology;  Laterality: N/A;   COLONOSCOPY WITH PROPOFOL  N/A 07/29/2022   Procedure: COLONOSCOPY WITH PROPOFOL ;  Surgeon: Therisa Bi, MD;  Location: [facility] ENDOSCOPY;  Service: Gastroenterology;  Laterality: N/A;   HIP ARTHROPLASTY     JOINT REPLACEMENT     MULTIPLE TOOTH EXTRACTIONS     RE-EXCISION OF BREAST LUMPECTOMY Left 10/07/2019   Procedure: RE-EXCISION OF LEFT BREAST INFERIOR MARGIN;  Surgeon: Curvin Deward MOULD, MD;  Location: [facility];  Service: General;  Laterality: Left;   TOTAL HIP ARTHROPLASTY Left 02/15/2018   Procedure: TOTAL HIP ARTHROPLASTY ANTERIOR APPROACH;  Surgeon: Liam Lerner, MD;  Location: [facility] ORS;  Service: Orthopedics;  Laterality: Left;   WISDOM TOOTH EXTRACTION      ROS: Review of Systems Negative except as stated above  PHYSICAL EXAM: BP 112/72 (BP Location: Left Arm, Patient Position: Sitting, Cuff Size: Normal)   Pulse 85   Ht 5' 5 (1.651 m)   Wt 114 lb (51.7 kg)   SpO2 95%   BMI 18.97 kg/m   Wt Readings from Last 3 Encounters:  10/27/23 114 lb (51.7 kg)  10/02/23 110 lb (49.9 kg)  09/15/23 115 lb (52.2 kg)    Physical Exam  General appearance - alert, well appearing, older Caucasian female and in no distress Mental status - normal mood, behavior, speech, dress, motor activity, and thought processes Neck - supple, no significant adenopathy Chest -breath sounds are mildly decreased bilaterally. Heart - normal rate, regular rhythm, normal S1, S2, no murmurs, rubs, clicks or gallops Musculoskeletal -she has some enlargement of the MCP joints of both hands. Extremities - peripheral pulses normal, no pedal edema, no clubbing or cyanosis     10/02/2023    2:35 PM 09/15/2023    4:16 PM 06/10/2023   11:18 AM  Depression screen PHQ 2/9  Decreased Interest 0 0 0  Down, Depressed, Hopeless 0 0 1  PHQ - 2 Score 0 0 1  Altered sleeping  0 2  Tired, decreased energy  0 2  Change in appetite  0 0   Feeling bad or failure about yourself   0 0  Trouble concentrating  0 1  Moving slowly or fidgety/restless  0 1  Suicidal thoughts  0 0  PHQ-9 Score  0 7  Difficult doing work/chores  Not difficult at all Somewhat difficult        Latest Ref Rng & Units 09/02/2023    2:27 PM 03/23/2023    2:01 PM 10/15/2022    3:07 PM  CMP  Glucose 65 - 99 mg/dL 76  82  72   BUN 7 - 25 mg/dL 17  12  14    Creatinine 0.50 - 1.05 mg/dL 9.18  9.24  9.23   Sodium 135 - 146 mmol/L 139  141  140   Potassium 3.5 - 5.3 mmol/L 4.5  4.5  4.5   Chloride 98 - 110 mmol/L 102  104  103   CO2 20 - 32 mmol/L 28  30  30    Calcium 8.6 - 10.4 mg/dL 9.7  9.4  9.4   Total Protein 6.1 - 8.1 g/dL 6.7  6.6  6.4   Total Bilirubin 0.2 - 1.2 mg/dL 0.4  0.7  0.7   AST 10 - 35 U/L 16  17  17    ALT 6 - 29 U/L 15  17  15  Lipid Panel     Component Value Date/Time   CHOL 176 07/30/2021 1127   TRIG 122 07/30/2021 1127   HDL 71 07/30/2021 1127   CHOLHDL 2.5 07/30/2021 1127   LDLCALC 84 07/30/2021 1127    CBC    Component Value Date/Time   WBC 6.2 09/02/2023 1427   RBC 4.93 09/02/2023 1427   HGB 14.7 09/02/2023 1427   HGB 14.5 10/24/2020 1210   HGB 12.8 01/15/2018 1509   HCT 46.0 (H) 09/02/2023 1427   HCT 38.3 01/15/2018 1509   PLT 210 09/02/2023 1427   PLT 167 10/24/2020 1210   PLT 311 01/15/2018 1509   MCV 93.3 09/02/2023 1427   MCV 86 01/15/2018 1509   MCH 29.8 09/02/2023 1427   MCHC 32.0 09/02/2023 1427   RDW 13.1 09/02/2023 1427   RDW 14.2 01/15/2018 1509   LYMPHSABS 1,380 10/15/2022 1507   MONOABS 0.5 07/31/2021 1145   EOSABS 136 09/02/2023 1427   BASOSABS 37 09/02/2023 1427    ASSESSMENT AND PLAN: 1. Rheumatoid arthritis involving multiple sites with positive rheumatoid factor (HCC) (Primary) Plugged in with rheumatologist.  On Enbrel  and methotrexate .  2. Chronic right shoulder pain Secondary to severe RA.  We discussed putting her on tramadol  to take as needed but would need to do controlled  substance prescribing agreement with opioid risk assessment and urine drug screen.  Patient states she was not aware that the tramadol  was a controlled substance/opioid.  She prefers to hold off at this time due to fear of dependency/addiction.  I recommend that she find out from her rheumatologist whether Celebrex  or meloxicam can be used as needed when she has a flare of pain in the shoulder.  She reports no benefit from extra-strength Tylenol .  3. Former smoker Commended her on quitting.  Encouraged her to remain tobacco free  4. Chronic obstructive pulmonary disease, unspecified COPD type (HCC) Stable.  Continue Breztri . She will be due for lung cancer screening next fall.  Will plan to order CT on her next visit. Keep upcoming appointment with [facility] to explore Cephar procedure  5. Cocaine abuse in remission [facility]) Commended her on this and encouraged her to remain free  6. History of breast cancer On tamoxifen   7. Need for vaccination against Streptococcus pneumoniae Given Prevnar 23 today.    Patient was given the opportunity to ask questions.  Patient verbalized understanding of the plan and was able to repeat key elements of the plan.   This documentation was completed using Paediatric nurse.  Any transcriptional errors are unintentional.  No orders of the defined types were placed in this encounter.    Requested Prescriptions    No prescriptions requested or ordered in this encounter    No follow-ups on file.  Barnie Louder, MD, FACP

## 2023-10-29 ENCOUNTER — Other Ambulatory Visit: Payer: Self-pay

## 2023-10-29 ENCOUNTER — Other Ambulatory Visit (HOSPITAL_COMMUNITY): Payer: Self-pay

## 2023-10-29 NOTE — Progress Notes (Signed)
 Specialty Pharmacy Refill Coordination Note  [name] is a 63 y.o. female contacted today regarding refills of specialty medication(s) Etanercept  (Enbrel  Mini)   Patient requested Delivery   Delivery date: 11/06/23   Verified address: [address] [name]  [address]   Medication will be filled on 11/05/23.

## 2023-11-03 DIAGNOSIS — M9902 Segmental and somatic dysfunction of thoracic region: Secondary | ICD-10-CM | POA: Diagnosis not present

## 2023-11-03 DIAGNOSIS — M546 Pain in thoracic spine: Secondary | ICD-10-CM | POA: Diagnosis not present

## 2023-11-03 DIAGNOSIS — M542 Cervicalgia: Secondary | ICD-10-CM | POA: Diagnosis not present

## 2023-11-03 DIAGNOSIS — M9901 Segmental and somatic dysfunction of cervical region: Secondary | ICD-10-CM | POA: Diagnosis not present

## 2023-11-13 ENCOUNTER — Other Ambulatory Visit: Payer: Self-pay

## 2023-11-13 ENCOUNTER — Other Ambulatory Visit (HOSPITAL_COMMUNITY): Payer: Self-pay

## 2023-11-16 ENCOUNTER — Other Ambulatory Visit: Payer: Self-pay

## 2023-11-17 DIAGNOSIS — M9902 Segmental and somatic dysfunction of thoracic region: Secondary | ICD-10-CM | POA: Diagnosis not present

## 2023-11-17 DIAGNOSIS — M542 Cervicalgia: Secondary | ICD-10-CM | POA: Diagnosis not present

## 2023-11-17 DIAGNOSIS — M9901 Segmental and somatic dysfunction of cervical region: Secondary | ICD-10-CM | POA: Diagnosis not present

## 2023-11-17 DIAGNOSIS — M546 Pain in thoracic spine: Secondary | ICD-10-CM | POA: Diagnosis not present

## 2023-11-20 ENCOUNTER — Other Ambulatory Visit (HOSPITAL_COMMUNITY): Payer: Self-pay

## 2023-11-20 ENCOUNTER — Other Ambulatory Visit: Payer: Self-pay

## 2023-11-23 ENCOUNTER — Encounter: Payer: Self-pay | Admitting: Pulmonary Disease

## 2023-11-23 ENCOUNTER — Ambulatory Visit: Admitting: Pulmonary Disease

## 2023-11-23 ENCOUNTER — Other Ambulatory Visit: Payer: Self-pay

## 2023-11-23 ENCOUNTER — Other Ambulatory Visit (HOSPITAL_COMMUNITY): Payer: Self-pay

## 2023-11-23 VITALS — BP 110/68 | HR 102 | Temp 97.1°F | Ht 65.0 in | Wt 113.0 lb

## 2023-11-23 DIAGNOSIS — Z87891 Personal history of nicotine dependence: Secondary | ICD-10-CM

## 2023-11-23 DIAGNOSIS — J449 Chronic obstructive pulmonary disease, unspecified: Secondary | ICD-10-CM | POA: Diagnosis not present

## 2023-11-23 DIAGNOSIS — J439 Emphysema, unspecified: Secondary | ICD-10-CM

## 2023-11-23 NOTE — Progress Notes (Signed)
 Synopsis: Referred in by Vicci Barnie NOVAK, MD   Subjective:   PATIENT ID: [name] GENDER: female DOB: 03-24-1960, MRN: [id number]  No chief complaint on file.   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 Darlean, rheumatoid arthritis on Enbrel   presenting today to the pumonary clinic to establish care.   She was last seen by Dr. Darlean om 02/2022 at which point she was switched to Breztri . Doing well on Breztri  but remains with significant dyspnea.   PFTs 2023 with Stage III COPD with post bronchodilator FEV-1 32% of predicted. A1AT 186 MM.    She is currently on Breztri  and albuterol  as needed. She completed pulmonary rehab and realized that she was really limited by her breathing and is seekin other treatment modalities one of which is zephyr valve which she is really interested in.   Family history: Maternal Uncle with lung cancer.   Social history:  Ex smoker quit in 2023. Smoked 1 ppd for multiple years.   OV 11/23/2023 - [name] is here to follow up regarding her COPD. She is doing well overall. We started her on Ohtuvayre  during the last visit 03/2023 she feels that it is helping. She is pending her appointment at [facility] for [facility] evaluation.   ROS All systems were reviewed and are negative except for the above.  Objective:   There were no vitals filed for this visit.    on RA BMI Readings from Last 3 Encounters:  10/27/23 18.97 kg/m  10/02/23 18.30 kg/m  09/15/23 19.14 kg/m   Wt Readings from Last 3 Encounters:  10/27/23 114 lb (51.7 kg)  10/02/23 110 lb (49.9 kg)  09/15/23 115 lb (52.2 kg)    Physical Exam GEN: NAD, Healthy Appearing HEENT: Supple Neck, Reactive Pupils, EOMI  CVS: Normal S1, Normal S2, RRR, No murmurs or ES appreciated  Lungs: Poor air movement bilaterally.  Abdomen: Soft, non tender, non distended, + BS  Extremities: Warm and well perfused, No edema   Skin: No suspicious lesions appreciated  Psych: Normal Affect  Ancillary Information   CBC    Component Value Date/Time   WBC 6.2 09/02/2023 1427   RBC 4.93 09/02/2023 1427   HGB 14.7 09/02/2023 1427   HGB 14.5 10/24/2020 1210   HGB 12.8 01/15/2018 1509   HCT 46.0 (H) 09/02/2023 1427   HCT 38.3 01/15/2018 1509   PLT 210 09/02/2023 1427   PLT 167 10/24/2020 1210   PLT 311 01/15/2018 1509   MCV 93.3 09/02/2023 1427   MCV 86 01/15/2018 1509   MCH 29.8 09/02/2023 1427   MCHC 32.0 09/02/2023 1427   RDW 13.1 09/02/2023 1427   RDW 14.2 01/15/2018 1509   LYMPHSABS 1,380 10/15/2022 1507   MONOABS 0.5 07/31/2021 1145   EOSABS 136 09/02/2023 1427   BASOSABS 37 09/02/2023 1427   Labs and imaging were reviewed.     Latest Ref Rng & Units 07/28/2023    3:49 PM 09/04/2021    3:42 PM  PFT Results  FVC-Pre L 1.53  1.66   FVC-Predicted Pre % 46  49   FVC-Post L 1.64  1.75   FVC-Predicted Post % 49  51   Pre FEV1/FVC % % 42  46   Post FEV1/FCV % % 43  48   FEV1-Pre L 0.63  0.76   FEV1-Predicted Pre % 24  29  FEV1-Post L 0.70  0.84   DLCO uncorrected ml/min/mmHg 9.75  10.70   DLCO UNC% % 47  51   DLCO corrected ml/min/mmHg  10.45   DLCO COR %Predicted %  50   DLVA Predicted % 76  72   TLC L 6.30  6.24   TLC % Predicted % 121  119   RV % Predicted % 216  206      Assessment & Plan:  [name] is a 63 year old female patient with a past medical history of Stage III COPD Gp B mmrc 2 previous patient of Dr. Darlean, heumatoid arthritis on Enbrel   presenting today to the pumonary clinic to establish care.   #Stage III COPD Gp B Mmrc 2. CAT 18 A1AT 186 MM.  []  Continue with budesonide -Formoterol -Glyco [Breztri ] 2puffs BID.  []  C/w Albuterol  as needed.  []  c/w Ohtuvayre  nebulizer 3mg  BID. (Start 03/2023)  []  Referral to [facility] for eval for Zephyr valve placement. (FEV-1 31%, TLC 119% and RV 209%, CT chest 2024 with extensive biapical emphysema) []  LDCT in 02/2024.   I spent 20  minutes caring for this patient today, including preparing to see the patient, obtaining a medical history , reviewing a separately obtained history, performing a medically appropriate examination and/or evaluation, counseling and educating the patient/family/caregiver, documenting clinical information in the electronic health record, and independently interpreting results (not separately reported/billed) and communicating results to the patient/family/caregiver  Darrin Barn, MD [facility] 11/23/2023 1:23 PM

## 2023-11-24 ENCOUNTER — Other Ambulatory Visit: Payer: Self-pay

## 2023-11-25 ENCOUNTER — Other Ambulatory Visit (HOSPITAL_COMMUNITY): Payer: Self-pay

## 2023-11-25 ENCOUNTER — Other Ambulatory Visit: Payer: Self-pay

## 2023-11-25 ENCOUNTER — Telehealth: Payer: Self-pay | Admitting: Pharmacist

## 2023-11-25 DIAGNOSIS — Z79899 Other long term (current) drug therapy: Secondary | ICD-10-CM

## 2023-11-25 DIAGNOSIS — J439 Emphysema, unspecified: Secondary | ICD-10-CM | POA: Diagnosis not present

## 2023-11-25 DIAGNOSIS — R0602 Shortness of breath: Secondary | ICD-10-CM | POA: Diagnosis not present

## 2023-11-25 DIAGNOSIS — J9809 Other diseases of bronchus, not elsewhere classified: Secondary | ICD-10-CM | POA: Diagnosis not present

## 2023-11-25 DIAGNOSIS — J432 Centrilobular emphysema: Secondary | ICD-10-CM | POA: Diagnosis not present

## 2023-11-25 DIAGNOSIS — R911 Solitary pulmonary nodule: Secondary | ICD-10-CM | POA: Diagnosis not present

## 2023-11-25 DIAGNOSIS — M0579 Rheumatoid arthritis with rheumatoid factor of multiple sites without organ or systems involvement: Secondary | ICD-10-CM

## 2023-11-25 MED ORDER — RASUVO 20 MG/0.4ML ~~LOC~~ SOAJ
20.0000 mg | SUBCUTANEOUS | 0 refills | Status: DC
  Filled 2023-11-25: qty 1.6, 28d supply, fill #0

## 2023-11-25 NOTE — Telephone Encounter (Signed)
 Received notice from [facility] that patient's Otrexup  is no longer available.  Rx states that patient cannot take Rasuvo . Based on chart review, patient has historically been unable to successfully inject due to limited hand dexterity. She cannot activate the Rasuvo  pen trigger. She had also previously asked her daughter-in-law to help  Will need to contact patient again to see if interested in retrying Rasuvo . Otherwise will need to switch to tablets unfortunately.  Sherry Pennant, PharmD, MPH, BCPS, CPP Clinical Pharmacist [facility])

## 2023-11-25 NOTE — Telephone Encounter (Signed)
 Patient returned call. She is open to retrying for Rasuvo . If any issues with completing injection successfully, she will notify the clinic and we can switch her back to methotrexate  tablets.  Rx sent to [facility]. Test claim shows no copay for 28 day supply. MyChart message sent to patient  Sherry Pennant, PharmD, MPH, BCPS, CPP Clinical Pharmacist [facility])

## 2023-11-25 NOTE — Telephone Encounter (Signed)
 ATC patient in regards to manufacturer discontinuation of Otrexup  and whether she wanted to try Rasuvo  again or just switch to tablets. LVM telling patient to call back.

## 2023-11-30 ENCOUNTER — Other Ambulatory Visit: Payer: Self-pay

## 2023-11-30 ENCOUNTER — Telehealth: Payer: Self-pay

## 2023-11-30 NOTE — Telephone Encounter (Signed)
 Copied from CRM [id number]. Topic: Clinical - Medication Question >> Nov 30, 2023 12:06 PM Corean SAUNDERS wrote: Reason for CRM: Patient inuring about breztri  samples as she needs some back up medication. Patient is requesting a call back.

## 2023-12-01 DIAGNOSIS — M9901 Segmental and somatic dysfunction of cervical region: Secondary | ICD-10-CM | POA: Diagnosis not present

## 2023-12-01 DIAGNOSIS — M546 Pain in thoracic spine: Secondary | ICD-10-CM | POA: Diagnosis not present

## 2023-12-01 DIAGNOSIS — M9902 Segmental and somatic dysfunction of thoracic region: Secondary | ICD-10-CM | POA: Diagnosis not present

## 2023-12-01 DIAGNOSIS — M542 Cervicalgia: Secondary | ICD-10-CM | POA: Diagnosis not present

## 2023-12-01 NOTE — Telephone Encounter (Signed)
 Breztri  samples were placed and the front desk for the patient to pick up.  Nothing further needed.

## 2023-12-02 ENCOUNTER — Other Ambulatory Visit (HOSPITAL_COMMUNITY): Payer: Self-pay

## 2023-12-13 ENCOUNTER — Other Ambulatory Visit: Payer: Self-pay | Admitting: Rheumatology

## 2023-12-13 DIAGNOSIS — Z79899 Other long term (current) drug therapy: Secondary | ICD-10-CM

## 2023-12-13 DIAGNOSIS — M0579 Rheumatoid arthritis with rheumatoid factor of multiple sites without organ or systems involvement: Secondary | ICD-10-CM

## 2023-12-14 ENCOUNTER — Other Ambulatory Visit: Payer: Self-pay

## 2023-12-14 MED ORDER — RASUVO 20 MG/0.4ML ~~LOC~~ SOAJ
20.0000 mg | SUBCUTANEOUS | 0 refills | Status: DC
  Filled 2023-12-14 – 2023-12-20 (×2): qty 1.6, 28d supply, fill #0

## 2023-12-14 NOTE — Telephone Encounter (Signed)
 Last Fill: 11/25/2023 (30 day supply)  Labs: 09/02/2023 CBC and CMP WNL   Next Visit: 01/25/2024  Last Visit: 09/02/2023  DX: Rheumatoid arthritis involving multiple sites with positive rheumatoid factor   Current Dose per office note on 09/02/2023: Otrexup  20 mg sq injections once weekly  Attempted to contact patient and left message to advise patient she is due to update labs, provided patient with lab hours. Please sign standing lab orders.   Okay to refill Methotrexate ?

## 2023-12-15 ENCOUNTER — Other Ambulatory Visit (HOSPITAL_COMMUNITY): Payer: Self-pay

## 2023-12-15 ENCOUNTER — Other Ambulatory Visit: Payer: Self-pay

## 2023-12-16 ENCOUNTER — Telehealth: Payer: Self-pay

## 2023-12-16 DIAGNOSIS — M9901 Segmental and somatic dysfunction of cervical region: Secondary | ICD-10-CM | POA: Diagnosis not present

## 2023-12-16 DIAGNOSIS — M542 Cervicalgia: Secondary | ICD-10-CM | POA: Diagnosis not present

## 2023-12-16 DIAGNOSIS — M9902 Segmental and somatic dysfunction of thoracic region: Secondary | ICD-10-CM | POA: Diagnosis not present

## 2023-12-16 DIAGNOSIS — M546 Pain in thoracic spine: Secondary | ICD-10-CM | POA: Diagnosis not present

## 2023-12-16 NOTE — Telephone Encounter (Signed)
 Patient contacted the office back regarding a voicemail stating she was due for labs. Patient states she has a lot going on right now and inquires if she can come to the office and do labs at her appointment in November. Please advise.

## 2023-12-16 NOTE — Telephone Encounter (Signed)
 Patient advised Ok to update labs at upcoming visit. If things calm down before then she can return sooner as a walk-in for labs. Patient verbalized understanding.

## 2023-12-16 NOTE — Telephone Encounter (Signed)
 Ok to update labs at upcoming visit. If things calm down before then she can return sooner as a walk-in for labs

## 2023-12-21 ENCOUNTER — Other Ambulatory Visit: Payer: Self-pay

## 2024-01-04 DIAGNOSIS — M9901 Segmental and somatic dysfunction of cervical region: Secondary | ICD-10-CM | POA: Diagnosis not present

## 2024-01-04 DIAGNOSIS — M546 Pain in thoracic spine: Secondary | ICD-10-CM | POA: Diagnosis not present

## 2024-01-04 DIAGNOSIS — M542 Cervicalgia: Secondary | ICD-10-CM | POA: Diagnosis not present

## 2024-01-04 DIAGNOSIS — M9902 Segmental and somatic dysfunction of thoracic region: Secondary | ICD-10-CM | POA: Diagnosis not present

## 2024-01-11 NOTE — Progress Notes (Unsigned)
 Office Visit Note  Patient: [name]             Date of Birth: 1960/06/13           MRN: [id number]             PCP: Vicci Barnie NOVAK, MD Referring: Vicci Barnie NOVAK, MD Visit Date: 01/25/2024 Occupation: Data Unavailable  Subjective:  Medication monitoring   History of Present Illness: [name] is a 63 y.o. female with history of rheumatoid arthritis.  Patient remains on Enbrel  50 mg sq injections once weekly, Rasuvo  20 mg sq injections once weekly, folic acid  2 mg daily.  She denies any recent gaps in therapy.  Patient reports that her primary concern remains her level of fatigue on a daily basis.  She has occasional discomfort in the right shoulder and both hands especially after repetitive or overuse activities.  She denies any joint swelling at this time. Patient states that her breathing seems to be improved since using a nebulizer 1-2 times daily.  Patient states that she has noticed loose stools since using the nebulizer.  She is planning to increase her fiber intake.     Activities of Daily Living:  Patient reports morning stiffness for 1-2 hours.   Patient Reports nocturnal pain.  Difficulty dressing/grooming: Reports Difficulty climbing stairs: Reports Difficulty getting out of chair: Denies Difficulty using hands for taps, buttons, cutlery, and/or writing: Reports  Review of Systems  Constitutional:  Positive for fatigue.  HENT:  Positive for mouth dryness. Negative for mouth sores.   Eyes:  Negative for dryness.  Respiratory:  Positive for shortness of breath.   Cardiovascular:  Negative for chest pain and palpitations.  Gastrointestinal:  Positive for diarrhea. Negative for blood in stool and constipation.  Endocrine: Negative for increased urination.  Genitourinary:  Positive for involuntary urination.  Musculoskeletal:  Positive for joint pain, joint pain, joint swelling, myalgias, muscle weakness, morning stiffness and myalgias. Negative for gait  problem and muscle tenderness.  Skin:  Negative for color change, rash, hair loss and sensitivity to sunlight.  Allergic/Immunologic: Negative for susceptible to infections.  Neurological:  Positive for dizziness and headaches.  Hematological:  Negative for swollen glands.  Psychiatric/Behavioral:  Positive for sleep disturbance. Negative for depressed mood. The patient is nervous/anxious.     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   Adenomatous colon polyp 03/16/2019   Degenerative joint disease of shoulder region 06/08/2018   Rheumatoid arthritis involving both hands with positive rheumatoid factor (HCC) 06/08/2018   Rheumatoid arthritis involving both feet with positive rheumatoid factor (HCC) 06/08/2018   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   Status post total hip replacement, 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   Anxiety and depression 06/08/2018   Family history of breast cancer 03/25/2018   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   Rheumatoid arthritis involving multiple sites (HCC) 01/15/2018   Opioid use agreement exists 01/15/2018   Positive depression screening 01/15/2018   Homeless 01/15/2018    Past Medical History:  Diagnosis  Date   Anemia    during pregnancy   Anxiety    Asthma    Bronchitis age 63   Cancer (HCC)    left breast ca   COPD (chronic obstructive pulmonary disease) (HCC)    Depression    Hx of bladder infections    been over 5 years since last one    Malignant neoplasm of upper-outer quadrant of left  breast in female, estrogen receptor positive (HCC)    Rheumatoid arthritis (HCC)    unc rheum    Tobacco abuse    Trouble in sleeping     Family History  Problem Relation Age of Onset   Breast cancer Mother 6       metastatic   Cervical cancer Mother    Lung cancer Brother        maternal half brother; d. 43s   Cancer Maternal Grandmother        unknown type; dx 19s   Healthy Son    Lung cancer Maternal Uncle        d. 31s   Lung cancer Maternal Uncle        d. 61   Breast cancer Maternal Great-grandmother        dx unknown age   Bladder Cancer Neg Hx    Rectal cancer Neg Hx    Renal cancer Neg Hx    Uterine cancer Neg Hx    Past Surgical History:  Procedure Laterality Date   BREAST LUMPECTOMY WITH RADIOACTIVE SEED AND SENTINEL LYMPH NODE BIOPSY Left 08/30/2019   Procedure: LEFT BREAST LUMPECTOMY X 2 WITH RADIOACTIVE SEED AND SENTINEL LYMPH NODE BIOPSY;  Surgeon: Curvin Deward MOULD, MD;  Location: [facility] OR;  Service: General;  Laterality: Left;  PEC BLOCK   COLONOSCOPY WITH PROPOFOL  N/A 03/15/2019   Procedure: COLONOSCOPY WITH PROPOFOL ;  Surgeon: Therisa Bi, MD;  Location: [facility] ENDOSCOPY;  Service: Gastroenterology;  Laterality: N/A;   COLONOSCOPY WITH PROPOFOL  N/A 07/29/2022   Procedure: COLONOSCOPY WITH PROPOFOL ;  Surgeon: Therisa Bi, MD;  Location: [facility] ENDOSCOPY;  Service: Gastroenterology;  Laterality: N/A;   HIP ARTHROPLASTY     JOINT REPLACEMENT     MULTIPLE TOOTH EXTRACTIONS     RE-EXCISION OF BREAST LUMPECTOMY Left 10/07/2019   Procedure: RE-EXCISION OF LEFT BREAST INFERIOR MARGIN;  Surgeon: Curvin Deward MOULD, MD;  Location: [facility];  Service: General;  Laterality: Left;   TOTAL HIP ARTHROPLASTY Left 02/15/2018   Procedure: TOTAL HIP ARTHROPLASTY ANTERIOR APPROACH;  Surgeon: Liam Lerner, MD;  Location: [facility] ORS;  Service: Orthopedics;  Laterality: Left;   WISDOM TOOTH EXTRACTION     Social History   Tobacco Use   Smoking status: Former    Current  packs/day: 0.00    Average packs/day: 1 pack/day for 40.0 years (40.0 ttl pk-yrs)    Types: Cigarettes    Start date: 03/10/1981    Quit date: 03/10/2021    Years since quitting: 2.8    Passive exposure: Past   Smokeless tobacco: Never   Tobacco comments:    on nicotine  patches  Vaping Use   Vaping status: Never Used  Substance Use Topics   Alcohol use: Yes    Comment: Occassionally   Drug use: Not Currently    Types: Cocaine    Comment: it's been years ago   Social History   Social History Narrative   Not on file     Immunization History  Administered Date(s) Administered   Influenza, Seasonal, Injecte, Preservative Fre  01/29/2023   Influenza,inj,Quad PF,6+ Mos 12/18/2020, 03/17/2022   PFIZER(Purple Top)SARS-COV-2 Vaccination 06/27/2019, 07/19/2019   Pneumococcal Conjugate-13 07/14/2018   Pneumococcal Polysaccharide-23 10/27/2023   Tdap 03/25/2018   Zoster Recombinant(Shingrix ) 07/14/2018, 06/17/2022     Objective: Vital Signs: BP 114/76   Pulse 85   Temp 97.6 F (36.4 C)   Resp 16   Ht 5' 5.5 (1.664 m)   Wt 114 lb 9.6 oz (52 kg)   BMI 18.78 kg/m    Physical Exam Vitals and nursing note reviewed.  Constitutional:      Appearance: She is well-developed.  HENT:     Head: Normocephalic and atraumatic.  Eyes:     Conjunctiva/sclera: Conjunctivae normal.  Cardiovascular:     Rate and Rhythm: Normal rate and regular rhythm.     Heart sounds: Normal heart sounds.  Pulmonary:     Effort: Pulmonary effort is normal.     Breath sounds: Normal breath sounds.  Abdominal:     General: Bowel sounds are normal.     Palpations: Abdomen is soft.  Musculoskeletal:     Cervical back: Normal range of motion.  Lymphadenopathy:     Cervical: No cervical adenopathy.  Skin:    General: Skin is warm and dry.     Capillary Refill: Capillary refill takes less than 2 seconds.  Neurological:     Mental Status: She is alert and oriented to person, place, and time.   Psychiatric:        Behavior: Behavior normal.      Musculoskeletal Exam: C-spine has limited range of motion with lateral rotation.  Painful and limited mobility of the right shoulder.  Slightly limited range of motion of the left shoulder.  Limited extension of the left elbow.  Wrist joints have good range of motion with no tenderness or synovitis.  No tenderness or synovitis of MCP joints.  PIP and DIP thickening consistent with osteoarthritis of both hands.  Synovial thickening of all MCP joints.  Left hip replacement has good range of motion with no groin pain.  Right hip joint has good range of motion with no groin pain.  Knee joints have good range of motion no warmth or effusion.  CDAI Exam: CDAI Score: -- Patient Global: --; Provider Global: -- Swollen: --; Tender: -- Joint Exam 01/25/2024   No joint exam has been documented for this visit   There is currently no information documented on the homunculus. Go to the Rheumatology activity and complete the homunculus joint exam.  Investigation: No additional findings.  Imaging: No results found.  Recent Labs: Lab Results  Component Value Date   WBC 6.2 09/02/2023   HGB 14.7 09/02/2023   PLT 210 09/02/2023   NA 139 09/02/2023   K 4.5 09/02/2023   CL 102 09/02/2023   CO2 28 09/02/2023   GLUCOSE 76 09/02/2023   BUN 17 09/02/2023   CREATININE 0.81 09/02/2023   BILITOT 0.4 09/02/2023   ALKPHOS 66 12/14/2020   AST 16 09/02/2023   ALT 15 09/02/2023   PROT 6.7 09/02/2023   ALBUMIN 4.4 12/14/2020   CALCIUM 9.7 09/02/2023   GFRAA 101 07/30/2020   QFTBGOLDPLUS NEGATIVE 10/15/2022    Speciality Comments: No specialty comments available.  Procedures:  No procedures performed Allergies: Other   Assessment / Plan:     Visit Diagnoses: Rheumatoid arthritis involving multiple sites with positive rheumatoid factor (HCC): She has no synovitis on examination today.  Overall her rheumatoid arthritis has been well-controlled  taking Enbrel   50 mg sq injections once weekly, Rasuvo  20 mg sq injections once weekly, and folic acid  2 mg daily.  She has been tolerating combination therapy without any side effects and has not had any injection site reactions.  She experiences intermittent discomfort in the right shoulder and both hands typically related to overuse or repetitive activities.  She had no active inflammation on examination today.  No medication changes will be made at this time.  Refills of both Enbrel  and Rasuvo  will be sent to the pharmacy today.  She was advised to notify us  if she starts to have more frequent flares.  She will follow-up in the office in 5 months or sooner if needed.  - Plan: Lipid panel  High risk medication use - Enbrel  50 mg sq injections once weekly, Rasuvo  20 mg sq injections once weekly, folic acid  2 mg daily. CBC and CMP updated on 09/02/23.  Orders for CBC and CMP were released today. TB gold negative on 10/15/22.  Order for TB gold released today. Order for lipid panel released today. No recurrent infections.  Discussed the importance of holding enbrel  and otrexup  if she develops signs or symptom of an infection and to resume once the infection has completely cleared.   - Plan: QuantiFERON-TB Gold Plus, CBC with Differential/Platelet, Comprehensive metabolic panel with GFR, Lipid panel  Chronic right shoulder pain: Chronic pain, limited mobility.  Intermittent exacerbations of pain leading to difficulty using the arm at times. Considering proceeding with surgical intervention next year.   Contracture of left elbow: No tenderness along the joint line.   Status post total hip replacement, left: Doing well. Good ROM with no groin pain currently.  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 Stable probably benign reactive LN left axilla-ultrasound updated on 09/01/2022.on  tamoxifen .  Other emphysema (HCC) - HRCT obtained on June 28, 2021 was consistent with mild bilateral bronchial wall thickening consistent with COPD and and a pulmonary nodule was noted.  Solitary pulmonary nodule on lung CT  Smoker  Anxiety and depression  Family history of breast cancer  History of anemia  Lipid screening -Lipid panel updated today.  Plan: Lipid panel  Orders: Orders Placed This Encounter  Procedures   QuantiFERON-TB Gold Plus   CBC with Differential/Platelet   Comprehensive metabolic panel with GFR   Lipid panel   No orders of the defined types were placed in this encounter.    Follow-Up Instructions: Return in about 5 months (around 06/24/2024) for Rheumatoid arthritis.   Waddell CHRISTELLA Craze, PA-C  Note - This record has been created using Dragon software.  Chart creation errors have been sought, but may not always  have been located. Such creation errors do not reflect on  the standard of medical care.

## 2024-01-14 ENCOUNTER — Other Ambulatory Visit: Payer: Self-pay | Admitting: Internal Medicine

## 2024-01-14 ENCOUNTER — Other Ambulatory Visit (HOSPITAL_COMMUNITY): Payer: Self-pay

## 2024-01-14 ENCOUNTER — Other Ambulatory Visit: Payer: Self-pay

## 2024-01-14 DIAGNOSIS — F17210 Nicotine dependence, cigarettes, uncomplicated: Secondary | ICD-10-CM

## 2024-01-15 ENCOUNTER — Other Ambulatory Visit (HOSPITAL_BASED_OUTPATIENT_CLINIC_OR_DEPARTMENT_OTHER): Payer: Self-pay

## 2024-01-15 ENCOUNTER — Other Ambulatory Visit: Payer: Self-pay

## 2024-01-15 MED ORDER — NICOTINE 7 MG/24HR TD PT24
7.0000 mg | MEDICATED_PATCH | Freq: Every day | TRANSDERMAL | 2 refills | Status: DC
  Filled 2024-01-15: qty 28, 28d supply, fill #0
  Filled 2024-02-25: qty 28, 28d supply, fill #1

## 2024-01-19 ENCOUNTER — Ambulatory Visit: Admitting: Obstetrics

## 2024-01-19 ENCOUNTER — Encounter: Payer: Self-pay | Admitting: Obstetrics

## 2024-01-19 VITALS — BP 116/78 | HR 68 | Ht 63.78 in | Wt 113.8 lb

## 2024-01-19 DIAGNOSIS — R159 Full incontinence of feces: Secondary | ICD-10-CM | POA: Diagnosis not present

## 2024-01-19 DIAGNOSIS — N3946 Mixed incontinence: Secondary | ICD-10-CM | POA: Insufficient documentation

## 2024-01-19 DIAGNOSIS — N941 Unspecified dyspareunia: Secondary | ICD-10-CM | POA: Insufficient documentation

## 2024-01-19 DIAGNOSIS — N952 Postmenopausal atrophic vaginitis: Secondary | ICD-10-CM | POA: Diagnosis not present

## 2024-01-19 DIAGNOSIS — R351 Nocturia: Secondary | ICD-10-CM | POA: Insufficient documentation

## 2024-01-19 LAB — POCT URINALYSIS DIP (CLINITEK)
Bilirubin, UA: NEGATIVE
Blood, UA: NEGATIVE
Glucose, UA: NEGATIVE mg/dL
Ketones, POC UA: NEGATIVE mg/dL
Leukocytes, UA: NEGATIVE
Nitrite, UA: NEGATIVE
POC PROTEIN,UA: NEGATIVE
Spec Grav, UA: 1.025 (ref 1.010–1.025)
Urobilinogen, UA: 1 U/dL
pH, UA: 5.5 (ref 5.0–8.0)

## 2024-01-19 NOTE — Patient Instructions (Addendum)
 For night time frequency: - avoid fluid intake 3 hours before bedtime - consider workup for sleep apnea due to dry mouth at night and snoring  For treatment of stress urinary incontinence, which is leakage with physical activity/movement/strainging/coughing, we discussed expectant management versus nonsurgical options versus surgery. Nonsurgical options include weight loss, physical therapy, as well as a pessary.  Surgical options include a midurethral sling, which is a synthetic mesh sling that acts like a hammock under the urethra to prevent leakage of urine, a Burch urethropexy, and transurethral injection of a bulking agent.   Please call [phone] to schedule the earliest appointment for pelvic floor PT.  Accidental Bowel Leakage:  - Treatment options include anti-diarrhea medication (loperamide/ Imodium OTC or prescription lomotil), fiber supplements, physical therapy, and possible sacral neuromodulation or surgery.     The origin of pelvic floor muscle spasm can be multifactorial, including primary, reactive to a different pain source, trauma, or even part of a centralized pain syndrome.Treatment options include pelvic floor physical therapy, local (vaginal) or oral  muscle relaxants, pelvic muscle trigger point injections or centrally acting pain medications.     - discussed proper vulvar care, warm compression, avoid pad use, cotton only underwear and barrier ointment if needed  - encouraged Vit E suppository, moisturizer with Replens/Revaree  Reduce fluid intake.

## 2024-01-19 NOTE — Assessment & Plan Note (Addendum)
-   avoid fluid intake 3 hours before bedtime - due to snoring, encouraged to review with pulm regarding sleep study to r/o OSA
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
## 2024-01-19 NOTE — Assessment & Plan Note (Signed)
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

## 2024-01-19 NOTE — Assessment & Plan Note (Addendum)
-   POCT UA negative, PVR 14mL - positive CST on exam with history of pulmonary emphysema and COPD - We discussed the symptoms of overactive bladder (OAB), which include urinary urgency, urinary frequency, nocturia, with or without urge incontinence.  While we do not know the exact etiology of OAB, several treatment options exist. We discussed management including behavioral therapy (decreasing bladder irritants, urge suppression strategies, timed voids, bladder retraining), physical therapy, medication; for refractory cases posterior tibial nerve stimulation, sacral neuromodulation, and intravesical botulinum toxin injection.  For anticholinergic medications, we discussed the potential side effects of anticholinergics including dry eyes, dry mouth, constipation, cognitive impairment and urinary retention. For Beta-3 agonist medication, we discussed the potential side effect of elevated blood pressure which is more likely to occur in individuals with uncontrolled hypertension. - referral to pelvic floor PT - desires to avoid medications - encouraged fluid management and Kegel exercises - For treatment of stress urinary incontinence,  non-surgical options include expectant management, weight loss, physical therapy, as well as a pessary.  Surgical options include a midurethral sling, Burch urethropexy, and transurethral injection of a bulking agent.

## 2024-01-19 NOTE — Assessment & Plan Note (Addendum)
-   history of trauma - The origin of pelvic floor muscle spasm can be multifactorial, including primary, reactive to a different pain source, trauma, or even part of a centralized pain syndrome.Treatment options include pelvic floor physical therapy, local (vaginal) or oral  muscle relaxants, pelvic muscle trigger point injections or centrally acting pain medications.   - referral to pelvic floor PT - encouraged lubrication during intercourse, vaginal moisturizer

## 2024-01-19 NOTE — Assessment & Plan Note (Addendum)
-   Treatment options include anti-diarrhea medication (loperamide/ Imodium OTC or prescription lomotil), fiber supplements, physical therapy, and possible sacral neuromodulation or surgery.   - referral to pelvic floor PT - encouraged titration of fiber supplementation - reviewed sacral neuromodulation if refractory symptoms

## 2024-01-25 ENCOUNTER — Ambulatory Visit: Attending: Physician Assistant | Admitting: Physician Assistant

## 2024-01-25 ENCOUNTER — Other Ambulatory Visit: Payer: Self-pay

## 2024-01-25 ENCOUNTER — Encounter: Payer: Self-pay | Admitting: Physician Assistant

## 2024-01-25 VITALS — BP 114/76 | HR 85 | Temp 97.6°F | Resp 16 | Ht 65.5 in | Wt 114.6 lb

## 2024-01-25 DIAGNOSIS — G8929 Other chronic pain: Secondary | ICD-10-CM

## 2024-01-25 DIAGNOSIS — Z79899 Other long term (current) drug therapy: Secondary | ICD-10-CM

## 2024-01-25 DIAGNOSIS — M25511 Pain in right shoulder: Secondary | ICD-10-CM

## 2024-01-25 DIAGNOSIS — M0579 Rheumatoid arthritis with rheumatoid factor of multiple sites without organ or systems involvement: Secondary | ICD-10-CM

## 2024-01-25 DIAGNOSIS — M24522 Contracture, left elbow: Secondary | ICD-10-CM

## 2024-01-25 DIAGNOSIS — Z13220 Encounter for screening for lipoid disorders: Secondary | ICD-10-CM

## 2024-01-25 DIAGNOSIS — J438 Other emphysema: Secondary | ICD-10-CM

## 2024-01-25 DIAGNOSIS — F419 Anxiety disorder, unspecified: Secondary | ICD-10-CM

## 2024-01-25 DIAGNOSIS — Z803 Family history of malignant neoplasm of breast: Secondary | ICD-10-CM

## 2024-01-25 DIAGNOSIS — Z96642 Presence of left artificial hip joint: Secondary | ICD-10-CM

## 2024-01-25 DIAGNOSIS — R911 Solitary pulmonary nodule: Secondary | ICD-10-CM

## 2024-01-25 DIAGNOSIS — F17200 Nicotine dependence, unspecified, uncomplicated: Secondary | ICD-10-CM

## 2024-01-25 DIAGNOSIS — F32A Depression, unspecified: Secondary | ICD-10-CM

## 2024-01-25 DIAGNOSIS — D0512 Intraductal carcinoma in situ of left breast: Secondary | ICD-10-CM

## 2024-01-25 DIAGNOSIS — Z862 Personal history of diseases of the blood and blood-forming organs and certain disorders involving the immune mechanism: Secondary | ICD-10-CM

## 2024-01-25 MED ORDER — RASUVO 20 MG/0.4ML ~~LOC~~ SOAJ
20.0000 mg | SUBCUTANEOUS | 0 refills | Status: AC
  Filled 2024-01-25: qty 1.6, 28d supply, fill #0
  Filled 2024-02-29: qty 1.6, 28d supply, fill #1
  Filled 2024-03-29: qty 1.6, 28d supply, fill #2

## 2024-01-25 MED ORDER — ENBREL MINI 50 MG/ML ~~LOC~~ SOCT
50.0000 mg | SUBCUTANEOUS | 0 refills | Status: AC
  Filled 2024-01-25 – 2024-02-29 (×2): qty 4, 28d supply, fill #0
  Filled 2024-03-29 – 2024-04-06 (×2): qty 4, 28d supply, fill #1

## 2024-01-25 NOTE — Progress Notes (Unsigned)
 Patient seen in office today, please review and sign.

## 2024-01-26 ENCOUNTER — Ambulatory Visit: Payer: Self-pay | Admitting: Physician Assistant

## 2024-01-26 ENCOUNTER — Other Ambulatory Visit: Payer: Self-pay

## 2024-01-26 NOTE — Progress Notes (Signed)
 RBC count is borderline elevated and hematocrit is borderline elevated-we will continue to monitor.  Rest of CBC WNL. CMP WNL Lipid panel WNL

## 2024-01-27 LAB — COMPREHENSIVE METABOLIC PANEL WITH GFR
AG Ratio: 1.8 (calc) (ref 1.0–2.5)
ALT: 22 U/L (ref 6–29)
AST: 23 U/L (ref 10–35)
Albumin: 4.2 g/dL (ref 3.6–5.1)
Alkaline phosphatase (APISO): 64 U/L (ref 37–153)
BUN: 15 mg/dL (ref 7–25)
CO2: 31 mmol/L (ref 20–32)
Calcium: 9.6 mg/dL (ref 8.6–10.4)
Chloride: 102 mmol/L (ref 98–110)
Creat: 0.83 mg/dL (ref 0.50–1.05)
Globulin: 2.3 g/dL (ref 1.9–3.7)
Glucose, Bld: 87 mg/dL (ref 65–99)
Potassium: 4.6 mmol/L (ref 3.5–5.3)
Sodium: 141 mmol/L (ref 135–146)
Total Bilirubin: 0.8 mg/dL (ref 0.2–1.2)
Total Protein: 6.5 g/dL (ref 6.1–8.1)
eGFR: 78 mL/min/1.73m2 (ref >=60)

## 2024-01-27 LAB — CBC WITH DIFFERENTIAL/PLATELET
Absolute Lymphocytes: 2090 {cells}/uL (ref 850–3900)
Absolute Monocytes: 319 {cells}/uL (ref 200–950)
Basophils Absolute: 38 {cells}/uL (ref 0–200)
Basophils Relative: 0.7 %
Eosinophils Absolute: 140 {cells}/uL (ref 15–500)
Eosinophils Relative: 2.6 %
HCT: 47 % — ABNORMAL HIGH (ref 35.0–45.0)
Hemoglobin: 15.4 g/dL (ref 11.7–15.5)
MCH: 30.1 pg (ref 27.0–33.0)
MCHC: 32.8 g/dL (ref 32.0–36.0)
MCV: 92 fL (ref 80.0–100.0)
MPV: 10.4 fL (ref 7.5–12.5)
Monocytes Relative: 5.9 %
Neutro Abs: 2813 {cells}/uL (ref 1500–7800)
Neutrophils Relative %: 52.1 %
Platelets: 237 Thousand/uL (ref 140–400)
RBC: 5.11 Million/uL — ABNORMAL HIGH (ref 3.80–5.10)
RDW: 12.7 % (ref 11.0–15.0)
Total Lymphocyte: 38.7 %
WBC: 5.4 Thousand/uL (ref 3.8–10.8)

## 2024-01-27 LAB — LIPID PANEL
Cholesterol: 188 mg/dL (ref <200)
HDL: 83 mg/dL (ref >=50)
LDL Cholesterol (Calc): 85 mg/dL
Non-HDL Cholesterol (Calc): 105 mg/dL (ref <130)
Total CHOL/HDL Ratio: 2.3 (calc) (ref <5.0)
Triglycerides: 104 mg/dL (ref <150)

## 2024-01-27 LAB — QUANTIFERON-TB GOLD PLUS
Mitogen-NIL: 9.14 [IU]/mL
NIL: 0.03 [IU]/mL
QuantiFERON-TB Gold Plus: NEGATIVE
TB1-NIL: 0 [IU]/mL
TB2-NIL: 0 [IU]/mL

## 2024-01-27 NOTE — Progress Notes (Signed)
 TB gold negative

## 2024-02-01 ENCOUNTER — Other Ambulatory Visit: Payer: Self-pay | Admitting: Rheumatology

## 2024-02-01 MED ORDER — PREDNISONE 5 MG PO TABS: ORAL_TABLET | ORAL | 0 refills | Status: DC

## 2024-02-01 NOTE — Telephone Encounter (Signed)
 Ok to send in prednisone  starting at 20 mg tapering by 5 mg every 4 days. Avoid the use of NSAIDs.  Take in the morning with food.

## 2024-02-01 NOTE — Telephone Encounter (Signed)
 Patient called stating she is experiencing inflammation in her hands, right shoulder and elbow.  Patient is requesting a prescription of Prednisone  be sent to [facility] in [place].

## 2024-02-01 NOTE — Telephone Encounter (Signed)
 Left message to advise patient we are sending in prednisone  starting at 20 mg tapering by 5 mg every 4 days. Avoid the use of NSAIDs. Take in the morning with food.

## 2024-02-02 ENCOUNTER — Ambulatory Visit: Payer: Self-pay | Admitting: Pulmonary Disease

## 2024-02-02 ENCOUNTER — Ambulatory Visit: Payer: Self-pay

## 2024-02-02 DIAGNOSIS — J4489 Other specified chronic obstructive pulmonary disease: Secondary | ICD-10-CM

## 2024-02-02 MED ORDER — PREDNISONE 20 MG PO TABS: 20.0000 mg | ORAL_TABLET | Freq: Every day | ORAL | 0 refills | Status: DC

## 2024-02-02 MED ORDER — AZITHROMYCIN 500 MG PO TABS: 500.0000 mg | ORAL_TABLET | Freq: Every day | ORAL | 0 refills | Status: DC

## 2024-02-02 MED ORDER — AZITHROMYCIN 500 MG PO TABS: 500.0000 mg | ORAL_TABLET | Freq: Every day | ORAL | 0 refills | Status: AC

## 2024-02-02 MED ORDER — PREDNISONE 20 MG PO TABS: 20.0000 mg | ORAL_TABLET | Freq: Every day | ORAL | 0 refills | Status: AC

## 2024-02-02 NOTE — Telephone Encounter (Signed)
 FYI Only or Action Required?: Action required by provider: clinical question for provider.  Patient is followed in Pulmonology for COPD, last seen on 11/23/2023 by Malka Domino, MD.  Called Nurse Triage reporting Cough.  Symptoms began a week ago.  Interventions attempted: Nebulizer treatments.  Symptoms are: unchanged.  Triage Disposition: Call PCP Now (overriding See HCP Within 4 Hours (Or PCP Triage))  Patient/caregiver understands and will follow disposition?:   Copied from CRM #[id number]. Topic: Clinical - Pink Word Triage >> Feb 02, 2024 11:02 AM Benton O wrote: Reason for Triage:  just wanted to bad cough for about a week now been using medicine thinking maybe i might need a antibiotic . do i need to call my primary or can dr assaker prescribed me something . [phone] Dr malka in [place]  Patient had a appointment to go to so she had to get off the phones before getting her over to triage . Paient is requesting a call back Reason for Disposition  Wheezing is present  Answer Assessment - Initial Assessment Questions Pt called in requesting medication be sent in for cough x 1 week. Pt is using nebulizer twice a day to aid with symptoms but would like something else. Reports she is around several irritants at work that worsen cough. Pt denies fever, decreased O2 levels or blood in sputum. Pt states she has an appt in January and would like rx sent in at this time. [facility] [address]. Patient will call back if anything changes, or if symptoms worsen.    1. ONSET: When did the cough begin?      1 week; recurrent q38months  2. SEVERITY: How bad is the cough today?      Bad exposed to environmental irritants d/t occupation  3. SPUTUM: Describe the color of your sputum (e.g., none, dry cough; clear, white, yellow, green)     Dry cough; clear and white when productive   4. HEMOPTYSIS: Are you coughing up any blood? If Yes, ask: How much?  (e.g., flecks, streaks, tablespoons, etc.)     None  5. DIFFICULTY BREATHING: Are you having difficulty breathing? If Yes, ask: How bad is it? (e.g., mild, moderate, severe)      Baseline SOB; O2 levels not decreasing   6. FEVER: Do you have a fever? If Yes, ask: What is your temperature, how was it measured, and when did it start?     none  7. CARDIAC HISTORY: Do you have any history of heart disease? (e.g., heart attack, congestive heart failure)      none  8. LUNG HISTORY: Do you have any history of lung disease?  (e.g., pulmonary embolus, asthma, emphysema)     Emphysema; COPD  9. PE RISK FACTORS: Do you have a history of blood clots? (or: recent major surgery, recent prolonged travel, bedridden)     none  10. OTHER SYMPTOMS: Do you have any other symptoms? (e.g., runny nose, wheezing, chest pain)       Wheezing, also baseline  Protocols used: Cough - Acute Productive-A-AH

## 2024-02-02 NOTE — Telephone Encounter (Addendum)
 Duplicate CRM, please see other nurse triage encounter.

## 2024-02-02 NOTE — Telephone Encounter (Signed)
 Corrected medication orders since they were originally ordered by Dr. Malka as a phone in and not normal to be electronically sent over. NFN.

## 2024-02-02 NOTE — Addendum Note (Signed)
 Addended by: Anacristina Steffek J on: 02/02/2024 01:32 PM   Modules accepted: Orders

## 2024-02-08 ENCOUNTER — Other Ambulatory Visit (HOSPITAL_COMMUNITY): Payer: Self-pay

## 2024-02-12 ENCOUNTER — Other Ambulatory Visit: Payer: Self-pay

## 2024-02-16 ENCOUNTER — Ambulatory Visit

## 2024-02-16 ENCOUNTER — Other Ambulatory Visit: Payer: Self-pay

## 2024-02-23 ENCOUNTER — Other Ambulatory Visit (HOSPITAL_COMMUNITY): Payer: Self-pay

## 2024-02-25 ENCOUNTER — Other Ambulatory Visit: Payer: Self-pay

## 2024-02-26 ENCOUNTER — Ambulatory Visit: Attending: Internal Medicine | Admitting: Internal Medicine

## 2024-02-26 ENCOUNTER — Other Ambulatory Visit: Payer: Self-pay

## 2024-02-26 VITALS — BP 100/69 | HR 111 | Ht 65.5 in | Wt 110.6 lb

## 2024-02-26 DIAGNOSIS — J449 Chronic obstructive pulmonary disease, unspecified: Secondary | ICD-10-CM | POA: Diagnosis not present

## 2024-02-26 DIAGNOSIS — Z122 Encounter for screening for malignant neoplasm of respiratory organs: Secondary | ICD-10-CM

## 2024-02-26 DIAGNOSIS — M0579 Rheumatoid arthritis with rheumatoid factor of multiple sites without organ or systems involvement: Secondary | ICD-10-CM

## 2024-02-26 DIAGNOSIS — Z87891 Personal history of nicotine dependence: Secondary | ICD-10-CM | POA: Diagnosis not present

## 2024-02-26 DIAGNOSIS — R918 Other nonspecific abnormal finding of lung field: Secondary | ICD-10-CM

## 2024-02-26 MED ORDER — ALBUTEROL SULFATE HFA 108 (90 BASE) MCG/ACT IN AERS
2.0000 | INHALATION_SPRAY | Freq: Four times a day (QID) | RESPIRATORY_TRACT | 6 refills | Status: AC | PRN
  Filled 2024-02-26: qty 6.7, 25d supply, fill #0
  Filled 2024-04-07: qty 6.7, 25d supply, fill #1

## 2024-02-26 NOTE — Progress Notes (Unsigned)
 "   Patient ID: [name], female    DOB: 14-Jun-1960  MRN: [id number]  CC: Medical Management of Chronic Issues (Follow up /Medication refill )   Subjective: [name] is a 63 y.o. female who presents for chronic ds management. Her concerns today include:  Patient with history of COPD, pulmonary nodules (needs repeat CT 01/2023 ), rheumatoid arthritis, arthritis RT shoulder, tob dep, THR, anxiety/depression, stress incontinence. 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  Status postlumpectomy followed by radiation.  No chemotherapy.  On adjuvant-tamoxifen  20 mg qhs [NOV, 01/2021].    Discussed the use of AI scribe software for clinical note transcription with the patient, who gave verbal consent to proceed.  History of Present Illness [name] is a 63 year old female with COPD who presents for a routine follow-up and lung cancer screening.  She is due for a CT scan for lung cancer screening, as the last one was done in December of the previous year. She has a history of smoking but has not yet reached twelve years since quitting. A previous CT scan showed a small nodule in the left upper lobe of the lungs that was unchanged from prior scans.  Regarding her COPD, she uses a nebulizer twice daily, which initially caused gastrointestinal issues, but she has managed these by increasing her dietary fiber intake. She also uses a Breztri  inhaler twice daily and reports improvement in her symptoms, such as reduced breathlessness when walking on flat surfaces. However, she still experiences difficulty with stairs. She engages in cardiovascular exercise four times a week.  She has rheumatoid arthritis and is on methotrexate  and Enbrel . She sees her rheumatologist every six months and does blood work every three months. She reports that the combination of her medications, including the nebulizer, affects her stomach, but she has managed to control these symptoms.  She has been  using nicotine  patches to help remain tobacco-free, especially during the holidays. She is on a 7 mg patch and has requested a refill to have on hand. She also needs a refill for her albuterol  inhaler, which she uses infrequently but wants to have available in case of need.  No issues with depression and feels she is managing well, although she sometimes feels overwhelmed. She attributes her well-being to regular exercise, which she finds beneficial for her mental and physical health.    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   Adenomatous colon polyp 03/16/2019   Degenerative joint disease of shoulder region 06/08/2018   Rheumatoid arthritis involving both hands with positive rheumatoid factor (HCC) 06/08/2018   Rheumatoid arthritis involving both feet with positive rheumatoid factor (HCC) 06/08/2018   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   Status post total hip replacement, 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   Anxiety and depression 06/08/2018   Family history of breast cancer 03/25/2018   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   Rheumatoid arthritis involving multiple sites (HCC) 01/15/2018   Opioid use agreement exists 01/15/2018   Positive depression screening 01/15/2018   Homeless 01/15/2018  Medications Ordered Prior to Encounter[1]  Allergies[2]  Social History   Socioeconomic History   Marital status: Single    Spouse name: Not on file   Number of children: 1   Years of education: 14 years   Highest education level: Associate  degree: academic program  Occupational History   Not on file  Tobacco Use   Smoking status: Former    Current packs/day: 0.00    Average packs/day: 1 pack/day for 40.0 years (40.0 ttl pk-yrs)    Types: Cigarettes    Start date: 03/10/1981    Quit date: 03/10/2021    Years since quitting: 2.9    Passive exposure: Past   Smokeless tobacco: Never   Tobacco comments:    on nicotine  patches  Vaping Use   Vaping status: Never Used  Substance and Sexual Activity   Alcohol use: Yes    Comment: Occassionally   Drug use: Not Currently    Types: Cocaine    Comment: it's been years ago   Sexual activity: Not Currently    Birth control/protection: Post-menopausal  Other Topics Concern   Not on file  Social History Narrative   Not on file   Social Drivers of Health   Tobacco Use: Medium Risk (01/25/2024)   Patient History    Smoking Tobacco Use: Former    Smokeless Tobacco Use: Never    Passive Exposure: Past  Physicist, Medical Strain: High Risk (02/26/2024)   Overall Financial Resource Strain (CARDIA)    Difficulty of Paying Living Expenses: Hard  Food Insecurity: Food Insecurity Present (02/26/2024)   Epic    Worried About Programme Researcher, Broadcasting/film/video in the Last Year: Sometimes true    Ran Out of Food in the Last Year: Sometimes true  Transportation Needs: No Transportation Needs (02/26/2024)   Epic    Lack of Transportation (Medical): No    Lack of Transportation (Non-Medical): No  Physical Activity: Insufficiently Active (02/26/2024)   Exercise Vital Sign    Days of Exercise per Week: 3 days    Minutes of Exercise per Session: 40 min  Stress: Stress Concern Present (02/26/2024)   [employer] of Occupational Health - Occupational Stress Questionnaire    Feeling of Stress: To some extent  Social Connections: Moderately Integrated (02/26/2024)   Social Connection and Isolation Panel    Frequency of Communication with Friends and Family: More than three times a week     Frequency of Social Gatherings with Friends and Family: Once a week    Attends Religious Services: More than 4 times per year    Active Member of [employer] or Organizations: Yes    Attends Banker Meetings: More than 4 times per year    Marital Status: Widowed  Intimate Partner Violence: Not At Risk (09/15/2023)   Epic    Fear of Current or Ex-Partner: No    Emotionally Abused: No    Physically Abused: No    Sexually Abused: No  Depression (PHQ2-9): Low Risk (10/02/2023)   Depression (PHQ2-9)    PHQ-2 Score: 0  Alcohol Screen: Low Risk (02/26/2024)   Alcohol Screen    Last Alcohol Screening Score (AUDIT): 2  Housing: Low Risk (02/26/2024)   Epic    Unable to Pay for Housing in the Last Year: No    Number of Times Moved in the Last Year: 1    Homeless in the Last Year: No  Utilities: Not At Risk (09/15/2023)   Epic    Threatened with loss  of utilities: No  Health Literacy: Adequate Health Literacy (09/15/2023)   B1300 Health Literacy    Frequency of need for help with medical instructions: Never    Family History  Problem Relation Age of Onset   Breast cancer Mother 33       metastatic   Cervical cancer Mother    Lung cancer Brother        maternal half brother; d. 61s   Cancer Maternal Grandmother        unknown type; dx 44s   Healthy Son    Lung cancer Maternal Uncle        d. 70s   Lung cancer Maternal Uncle        d. 29   Breast cancer Maternal Great-grandmother        dx unknown age   Bladder Cancer Neg Hx    Rectal cancer Neg Hx    Renal cancer Neg Hx    Uterine cancer Neg Hx     Past Surgical History:  Procedure Laterality Date   BREAST LUMPECTOMY WITH RADIOACTIVE SEED AND SENTINEL LYMPH NODE BIOPSY Left 08/30/2019   Procedure: LEFT BREAST LUMPECTOMY X 2 WITH RADIOACTIVE SEED AND SENTINEL LYMPH NODE BIOPSY;  Surgeon: Curvin Deward MOULD, MD;  Location: [facility] OR;  Service: General;  Laterality: Left;  PEC BLOCK   COLONOSCOPY WITH PROPOFOL  N/A 03/15/2019    Procedure: COLONOSCOPY WITH PROPOFOL ;  Surgeon: Therisa Bi, MD;  Location: [facility] ENDOSCOPY;  Service: Gastroenterology;  Laterality: N/A;   COLONOSCOPY WITH PROPOFOL  N/A 07/29/2022   Procedure: COLONOSCOPY WITH PROPOFOL ;  Surgeon: Therisa Bi, MD;  Location: [facility] ENDOSCOPY;  Service: Gastroenterology;  Laterality: N/A;   HIP ARTHROPLASTY     JOINT REPLACEMENT     MULTIPLE TOOTH EXTRACTIONS     RE-EXCISION OF BREAST LUMPECTOMY Left 10/07/2019   Procedure: RE-EXCISION OF LEFT BREAST INFERIOR MARGIN;  Surgeon: Curvin Deward MOULD, MD;  Location: [facility];  Service: General;  Laterality: Left;   TOTAL HIP ARTHROPLASTY Left 02/15/2018   Procedure: TOTAL HIP ARTHROPLASTY ANTERIOR APPROACH;  Surgeon: Liam Lerner, MD;  Location: [facility] ORS;  Service: Orthopedics;  Laterality: Left;   WISDOM TOOTH EXTRACTION      ROS: Review of Systems Negative except as stated above  PHYSICAL EXAM: BP 100/69   Pulse (!) 111   Ht 5' 5.5 (1.664 m)   Wt 110 lb 9.6 oz (50.2 kg)   SpO2 92%   BMI 18.12 kg/m   Physical Exam  {female adult master:[id number]}     10/02/2023    2:35 PM 09/15/2023    4:16 PM 06/10/2023   11:18 AM  Depression screen PHQ 2/9  Decreased Interest 0 0 0  Down, Depressed, Hopeless 0 0 1  PHQ - 2 Score 0 0 1  Altered sleeping  0 2  Tired, decreased energy  0 2  Change in appetite  0 0  Feeling bad or failure about yourself   0 0  Trouble concentrating  0 1  Moving slowly or fidgety/restless  0 1  Suicidal thoughts  0 0  PHQ-9 Score  0  7   Difficult doing work/chores  Not difficult at all Somewhat difficult     Data saved with a previous flowsheet row definition        Latest Ref Rng & Units 01/25/2024    1:42 PM 09/02/2023    2:27 PM 03/23/2023    2:01 PM  CMP  Glucose 65 - 99 mg/dL 87  76  82   BUN 7 - 25 mg/dL 15  17  12    Creatinine 0.50 - 1.05 mg/dL 9.16  9.18  9.24   Sodium 135 - 146 mmol/L 141  139  141   Potassium 3.5 - 5.3 mmol/L 4.6  4.5  4.5   Chloride 98  - 110 mmol/L 102  102  104   CO2 20 - 32 mmol/L 31  28  30    Calcium 8.6 - 10.4 mg/dL 9.6  9.7  9.4   Total Protein 6.1 - 8.1 g/dL 6.5  6.7  6.6   Total Bilirubin 0.2 - 1.2 mg/dL 0.8  0.4  0.7   AST 10 - 35 U/L 23  16  17    ALT 6 - 29 U/L 22  15  17     Lipid Panel     Component Value Date/Time   CHOL 188 01/25/2024 1342   CHOL 176 07/30/2021 1127   TRIG 104 01/25/2024 1342   HDL 83 01/25/2024 1342   HDL 71 07/30/2021 1127   CHOLHDL 2.3 01/25/2024 1342   LDLCALC 85 01/25/2024 1342    CBC    Component Value Date/Time   WBC 5.4 01/25/2024 1342   RBC 5.11 (H) 01/25/2024 1342   HGB 15.4 01/25/2024 1342   HGB 14.5 10/24/2020 1210   HGB 12.8 01/15/2018 1509   HCT 47.0 (H) 01/25/2024 1342   HCT 38.3 01/15/2018 1509   PLT 237 01/25/2024 1342   PLT 167 10/24/2020 1210   PLT 311 01/15/2018 1509   MCV 92.0 01/25/2024 1342   MCV 86 01/15/2018 1509   MCH 30.1 01/25/2024 1342   MCHC 32.8 01/25/2024 1342   RDW 12.7 01/25/2024 1342   RDW 14.2 01/15/2018 1509   LYMPHSABS 1,380 10/15/2022 1507   MONOABS 0.5 07/31/2021 1145   EOSABS 140 01/25/2024 1342   BASOSABS 38 01/25/2024 1342    ASSESSMENT AND PLAN:  Assessment and Plan Assessment & Plan Chronic obstructive pulmonary disease COPD well-managed with current inhaler and nebulizer regimen. Improved exercise tolerance, no significant dyspnea. Not a candidate for Cefar implant. - Continue Breztri  inhaler twice daily. - Continue nebulizer use twice daily. - Sent refill for albuterol  inhaler to [facility].  Screening for lung cancer Annual lung cancer screening CT scan due. Previous scan showed stable nodule. Former smoker, requires continued screening. - Ordered CT scan for lung cancer screening.  Rheumatoid arthritis with positive rheumatoid factor Managed with methotrexate  and Enbrel . Regular rheumatologist visits and blood work. GI side effects managed with dietary fiber. - Continue methotrexate  and Enbrel  as  prescribed. - Continue follow-up with rheumatologist every six months. - Continue blood work every three months.  General Health Maintenance Due for bone density scan. Mammogram scheduled. Using nicotine  patches to remain tobacco-free. - Contact [facility] for bone density scan appointment. - Continue nicotine  patches as needed.     There are no diagnoses linked to this encounter.   Patient was given the opportunity to ask questions.  Patient verbalized understanding of the plan and was able to repeat key elements of the plan.   This documentation was completed using Paediatric nurse.  Any transcriptional errors are unintentional.  No orders of the defined types were placed in this encounter.    Requested Prescriptions   Pending Prescriptions Disp Refills   albuterol  (VENTOLIN  HFA) 108 (90 Base) MCG/ACT inhaler 18 g 6    Sig: Inhale 2 puffs into the lungs every 6 (six) hours as  needed for wheezing or shortness of breath.    No follow-ups on file.  Barnie Louder, MD, FACP    [1]  Current Outpatient Medications on File Prior to Visit  Medication Sig Dispense Refill   budesonide -glycopyrrolate -formoterol  (BREZTRI  AEROSPHERE) 160-9-4.8 MCG/ACT AERO inhaler Inhale 2 puffs into the lungs 2 (two) times daily. 10.7 g 11   Calcium Carb-Cholecalciferol  (CALCIUM 1000 + D PO) Take by mouth.     COLLAGEN PO Take by mouth daily.     cyanocobalamin  100 MCG tablet Take 100 mcg by mouth daily.     etanercept  (ENBREL  MINI) 50 MG/ML injection Inject 1 mL (50 mg total) into the skin once a week. 12 mL 0   folic acid  (FOLVITE ) 1 MG tablet Take 2 tablets (2 mg total) by mouth daily. 180 tablet 3   Methotrexate , PF, (RASUVO ) 20 MG/0.4ML SOAJ Inject 20 mg into the skin once a week. 4.8 mL 0   Multiple Vitamin (MULTIVITAMIN WITH MINERALS) TABS tablet Take 1 tablet by mouth daily.     nicotine  (NICODERM CQ  - DOSED IN MG/24 HR) 7 mg/24hr patch Place 1 patch (7 mg total)  onto the skin daily. 28 patch 2   OHTUVAYRE  3 MG/2.5ML SUSP      predniSONE  (DELTASONE ) 5 MG tablet Take 5 mg by mouth.     predniSONE  (DELTASONE ) 5 MG tablet Take 4 tabs po x 4 days, 3  tabs po x 4 days, 2  tabs po x 4 days, 1  tab po x 4 days. Take in the morning with breakfast. Do not take any NSAIDS. 40 tablet 0   sodium chloride  HYPERTONIC 3 % nebulizer solution Take by nebulization as needed for other.     tamoxifen  (NOLVADEX ) 20 MG tablet Take 1 tablet (20 mg total) by mouth daily. 90 tablet 1   VITAMIN D  PO Take by mouth daily.     albuterol  (VENTOLIN  HFA) 108 (90 Base) MCG/ACT inhaler TAKE 2 PUFFS BY MOUTH EVERY 6 HOURS AS NEEDED FOR WHEEZE OR SHORTNESS OF BREATH 18 g 0   No current facility-administered medications on file prior to visit.  [2]  Allergies Allergen Reactions   Other Rash    Unknown cleaner used in OR   "

## 2024-02-26 NOTE — Patient Instructions (Signed)
" °  VISIT SUMMARY: [name], you had a routine follow-up appointment today to review your COPD management and to schedule your annual lung cancer screening. We discussed your current medications and their effects, as well as your exercise routine and overall well-being. You are managing your COPD well with your current treatment plan, and we have scheduled your next CT scan for lung cancer screening. We also reviewed your rheumatoid arthritis management and general health maintenance needs.  YOUR PLAN: -CHRONIC OBSTRUCTIVE PULMONARY DISEASE (COPD): COPD is a chronic lung condition that makes it hard to breathe. You are managing your COPD well with your current inhaler and nebulizer regimen. Continue using the Breztri  inhaler twice daily and the nebulizer twice daily. A refill for your albuterol  inhaler has been sent to [facility].  -SCREENING FOR LUNG CANCER: Given your history of smoking, you need regular lung cancer screenings. We have ordered your annual CT scan to check for any changes in your lungs.  -RHEUMATOID ARTHRITIS: Rheumatoid arthritis is an autoimmune condition that causes joint inflammation. You are managing it with methotrexate  and Enbrel . Continue taking these medications as prescribed, follow up with your rheumatologist every six months, and do blood work every three months.  -GENERAL HEALTH MAINTENANCE: You are due for a bone density scan and have a mammogram scheduled. Continue using nicotine  patches as needed to stay tobacco-free. Contact [facility] to schedule your bone density scan.  INSTRUCTIONS: Please schedule your CT scan for lung cancer screening and your bone density scan. Continue with your current medications and follow up with your rheumatologist as planned. A refill for your albuterol  inhaler has been sent to [facility].                      Contains text generated by [employer]bridge.                                  Contains text generated by Abridge.   "

## 2024-02-27 ENCOUNTER — Other Ambulatory Visit (HOSPITAL_COMMUNITY): Payer: Self-pay

## 2024-02-27 ENCOUNTER — Encounter: Payer: Self-pay | Admitting: Internal Medicine

## 2024-02-27 MED ORDER — NICOTINE 7 MG/24HR TD PT24
7.0000 mg | MEDICATED_PATCH | Freq: Every day | TRANSDERMAL | 2 refills | Status: AC
  Filled 2024-02-27 – 2024-03-19 (×2): qty 28, 28d supply, fill #0

## 2024-02-29 ENCOUNTER — Other Ambulatory Visit: Payer: Self-pay

## 2024-02-29 ENCOUNTER — Other Ambulatory Visit (HOSPITAL_COMMUNITY): Payer: Self-pay

## 2024-02-29 NOTE — Progress Notes (Signed)
 Specialty Pharmacy Refill Coordination Note  [name] is a 63 y.o. female contacted today regarding refills of specialty medication(s) Etanercept  (Enbrel  Mini)   Patient requested Delivery   Delivery date: 03/09/24   Verified address: [address] [name]  [address]   Medication will be filled on: 03/08/24

## 2024-03-08 ENCOUNTER — Other Ambulatory Visit: Payer: Self-pay

## 2024-03-15 ENCOUNTER — Inpatient Hospital Stay: Admission: RE | Admit: 2024-03-15 | Source: Ambulatory Visit

## 2024-03-20 ENCOUNTER — Other Ambulatory Visit: Payer: Self-pay

## 2024-03-21 ENCOUNTER — Other Ambulatory Visit: Payer: Self-pay

## 2024-03-24 ENCOUNTER — Ambulatory Visit
Admission: RE | Admit: 2024-03-24 | Discharge: 2024-03-24 | Disposition: A | Source: Ambulatory Visit | Attending: Internal Medicine | Admitting: Internal Medicine

## 2024-03-24 DIAGNOSIS — R918 Other nonspecific abnormal finding of lung field: Secondary | ICD-10-CM

## 2024-03-24 DIAGNOSIS — Z122 Encounter for screening for malignant neoplasm of respiratory organs: Secondary | ICD-10-CM

## 2024-03-28 ENCOUNTER — Encounter: Payer: Self-pay | Admitting: Physical Therapy

## 2024-03-28 ENCOUNTER — Ambulatory Visit
Admission: RE | Admit: 2024-03-28 | Discharge: 2024-03-28 | Disposition: A | Discharge status: Home / Self Care | Source: Ambulatory Visit | Attending: Nurse Practitioner | Admitting: Nurse Practitioner

## 2024-03-28 ENCOUNTER — Ambulatory Visit: Attending: Obstetrics | Admitting: Physical Therapy

## 2024-03-28 DIAGNOSIS — Z5181 Encounter for therapeutic drug level monitoring: Secondary | ICD-10-CM | POA: Diagnosis present

## 2024-03-28 DIAGNOSIS — Z79811 Long term (current) use of aromatase inhibitors: Secondary | ICD-10-CM | POA: Insufficient documentation

## 2024-03-28 DIAGNOSIS — Z08 Encounter for follow-up examination after completed treatment for malignant neoplasm: Secondary | ICD-10-CM | POA: Insufficient documentation

## 2024-03-28 DIAGNOSIS — M533 Sacrococcygeal disorders, not elsewhere classified: Secondary | ICD-10-CM | POA: Diagnosis present

## 2024-03-28 DIAGNOSIS — Z853 Personal history of malignant neoplasm of breast: Secondary | ICD-10-CM | POA: Insufficient documentation

## 2024-03-28 DIAGNOSIS — M25511 Pain in right shoulder: Secondary | ICD-10-CM | POA: Insufficient documentation

## 2024-03-28 DIAGNOSIS — R2689 Other abnormalities of gait and mobility: Secondary | ICD-10-CM | POA: Insufficient documentation

## 2024-03-28 DIAGNOSIS — R278 Other lack of coordination: Secondary | ICD-10-CM | POA: Insufficient documentation

## 2024-03-28 DIAGNOSIS — G8929 Other chronic pain: Secondary | ICD-10-CM | POA: Diagnosis present

## 2024-03-28 DIAGNOSIS — M5459 Other low back pain: Secondary | ICD-10-CM | POA: Insufficient documentation

## 2024-03-28 NOTE — Patient Instructions (Signed)
 Avoid straining pelvic floor, abdominal muscles , spine  Use log rolling technique instead of getting out of bed with your neck or the sit-up     Log rolling into and out of bed   Log rolling into and out of bed If getting out of bed on R side, Bent knees, scoot hips/ shoulder to L  Raise R arm completely overhead, rolling onto armpit  Then lower bent knees to bed to get into complete side lying position  Then drop legs off bed, and push up onto R elbow/forearm, and use L hand to push onto the bed  __   Proper body mechanics with getting out of a chair to decrease strain  on back &pelvic floor   Avoid holding your breath when Getting out of the chair:  Scoot to front part of chair chair Heels behind knees, feet are hip width apart, nose over toes  Inhale like you are smelling roses Exhale to stand   __  Sitting with feet on ground, four points of contact Catch yourself crossing ankles and thighs  __

## 2024-03-28 NOTE — Therapy (Unsigned)
 " OUTPATIENT PHYSICAL THERAPY EVALUATION   Patient Name: [name] MRN: [id number] DOB:[birth year], 64 y.o., female Today's Date: 03/28/2024    Past Medical History:  Diagnosis Date   Anemia    during pregnancy   Anxiety    Asthma    Bronchitis age 26   Cancer (HCC)    left breast ca   COPD (chronic obstructive pulmonary disease) (HCC)    Depression    Hx of bladder infections    been over 5 years since last one    Malignant neoplasm of upper-outer quadrant of left breast in female, estrogen receptor positive (HCC)    Rheumatoid arthritis (HCC)    unc rheum    Tobacco abuse    Trouble in sleeping    Past Surgical History:  Procedure Laterality Date   BREAST LUMPECTOMY WITH RADIOACTIVE SEED AND SENTINEL LYMPH NODE BIOPSY Left 08/30/2019   Procedure: LEFT BREAST LUMPECTOMY X 2 WITH RADIOACTIVE SEED AND SENTINEL LYMPH NODE BIOPSY;  Surgeon: Curvin Deward MOULD, MD;  Location: [facility] OR;  Service: General;  Laterality: Left;  PEC BLOCK   COLONOSCOPY WITH PROPOFOL  N/A 03/15/2019   Procedure: COLONOSCOPY WITH PROPOFOL ;  Surgeon: Therisa Bi, MD;  Location: [facility] ENDOSCOPY;  Service: Gastroenterology;  Laterality: N/A;   COLONOSCOPY WITH PROPOFOL  N/A 07/29/2022   Procedure: COLONOSCOPY WITH PROPOFOL ;  Surgeon: Therisa Bi, MD;  Location: [facility] ENDOSCOPY;  Service: Gastroenterology;  Laterality: N/A;   HIP ARTHROPLASTY     JOINT REPLACEMENT     MULTIPLE TOOTH EXTRACTIONS     RE-EXCISION OF BREAST LUMPECTOMY Left 10/07/2019   Procedure: RE-EXCISION OF LEFT BREAST INFERIOR MARGIN;  Surgeon: Curvin Deward MOULD, MD;  Location: [facility];  Service: General;  Laterality: Left;   TOTAL HIP ARTHROPLASTY Left 02/15/2018   Procedure: TOTAL HIP ARTHROPLASTY ANTERIOR APPROACH;  Surgeon: Liam Lerner, MD;  Location: [facility]S;  Service: Orthopedics;  Laterality: Left;   WISDOM TOOTH EXTRACTION     Patient Active Problem List   Diagnosis Date Noted   Incontinence of feces 01/19/2024    Dyspareunia in female 01/19/2024   Nocturia 01/19/2024   Urinary incontinence, mixed 01/19/2024   Vaginal atrophy 01/19/2024   History of colonic polyps 07/29/2022   COPD GOLD  3 07/31/2021   Solitary pulmonary nodule on lung CT 07/31/2021   Malignant neoplasm of upper-outer quadrant of left breast in female, estrogen receptor positive (HCC) 09/15/2019   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   Adenomatous colon polyp 03/16/2019   Degenerative joint disease of shoulder region 06/08/2018   Rheumatoid arthritis involving both hands with positive rheumatoid factor (HCC) 06/08/2018   Rheumatoid arthritis involving both feet with positive rheumatoid factor (HCC) 06/08/2018   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   Status post total hip replacement, 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   Anxiety and depression 06/08/2018   Family history of breast cancer 03/25/2018   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   Rheumatoid arthritis involving multiple sites (HCC) 01/15/2018   Opioid use agreement exists 01/15/2018   Positive depression screening 01/15/2018   Homeless 01/15/2018    PCP:  Barnie Louder ,MD   REFERRING PROVIDER:  Lianne Gillis, MD   REFERRING DIAG:  N94.10 (ICD-10-CM) - Dyspareunia in female R15.9 (ICD-10-CM) - Incontinence of feces, unspecified fecal incontinence type R35.1 (ICD-10-CM) - Nocturia N39.46 (ICD-10-CM) - Urinary incontinence, mixed   Rationale for Evaluation and Treatment Rehabilitation  THERAPY DIAG:  Other abnormalities of gait and mobility  Sacrococcygeal disorders, not elsewhere classified  Other  lack of coordination  Chronic right shoulder pain  Other low back pain  ONSET DATE:   SUBJECTIVE:                                                                                                                                                                                           SUBJECTIVE STATEMENT: 1)   loose stools - across 70% of the time, she has type 6-7  and 30% of the time, Type 4, Pt reports her medications are causing loose stool and her MD knows that. She is trying to increase her fiber. Pt was told to decrease her water  intake from 10 bottles to 3 bottles. Pt would like to work with a nutritionist.  Majority of her life, she had excellent bowel movements.  Once  month in the past 6 months, pt had fecal leakage and also had to run to get to the bathroom and soiled her pad.   2)  LBP: occurs with incorrect movements, turning wrong. 7/10 pain. Pt see DC 2 x month . Pt also stretches and wants to return to yoga   3)  R shoulder pain : 5/10 pain , pt has limited ROM  and no radiating pain , sometimes at night. Pt wants to return to yoga     PERTINENT HISTORY:              Vaginal  delivery 1 child Hx of breast CA with lumpectomy and radiation to L breast 2021 Hx of L THA 2019  COPD   Interdisciplinary update:  Pt will seeing her pulmonologist 03/31/23 for updates on her CT scan for lung nodules   PAIN:  Are you having pain? Yes: see above   PRECAUTIONS:   Pt was told to not do a lot of lifting with R arm due to R shoulder   WEIGHT BEARING RESTRICTIONS: No   FALLS:  Has patient fallen in last 6 months?  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   PLOF: IND   PATIENT GOALS:  Complete PT for improving continence, improve digestion and stool type, improving R shoulder ROM to return to yoga, strengthen lower back , and get stronger , improve her lungs   OBJECTIVE:   R shoulder flexion 110 deg, L 120 deg  1.18 m/s ( L pelvic sway, limited R posterior  rotation)   R shoulder, R iliac crest lowered  No LBP with 4 directions of the spine     HOME EXERCISE PROGRAM: See pt instruction section     ASSESSMENT:  CLINICAL IMPRESSION: Pt is a  64  yo  who presents with  following issues which impact QOL, ADL,  fitness, social and community activities:      Pt's musculoskeletal assessment revealed uneven pelvic girdle and shoulder height, asymmetries to gait pattern, limited spinal /pelvic mobility, poor body mechanics which places strain on the abdominal/pelvic floor mm. These are deficits that indicate an ineffective intraabdominal pressure system associated with increased risk for pt's Sx.     Pt will benefit from propioception/ coordination/ body mechanics training and education with gravity-loaded tasks at work and home and  fitness modifications in order to gain a more effective intraabdominal pressure system to minimize Sx. Advised pt to not perform sit-ups and crunches as these movement patterns lead to more downward forces on the pelvic floor, negatively impacting abdominopelvic/spinal dysfunctions.   Pt was provided education on etiology of Sx with anatomy, physiology explanation with images along with the benefits of customized pelvic PT Tx based on pt's medical conditions and musculoskeletal deficits.  Explained the physiology of deep core mm coordination and roles of pelvic floor function/  lower kinetic chain ( LKC) , spinal/pelvic alignment for urination, defecation, sexual function, and postural control with deep core mm system, and the role of posture and alignment to help pelvic issues.    Following Tx today which pt tolerated without complaints,  pt demo'd proper body mechanics to minimize straining pelvic floor.  Plan to address realignment of spine/ pelvis at next session to help promote optimize intra-abdominal pressure system (IAP)  for improved pelvic floor function, trunk stability, gait, balance, stabilization with mobility tasks.  Plan to address pelvic floor issues once pelvis and spine are realigned to yield better outcomes.    Regional interdependent approaches  will yield greater benefits in pt's POC due to the complexity of pt's medical Hx and the significant impact their Sx have had on their QOL. Pt would benefit from a biopsychosocial approach to yield optimal outcomes. Plan to build interdisciplinary team with pt's providers to optimize patient-centered care as needed.                                                      Pt benefits from skilled PT.    OBJECTIVE IMPAIRMENTS decreased activity tolerance, decreased coordination, decreased endurance, decreased mobility, difficulty walking, decreased ROM, decreased strength, decreased safety awareness, hypomobility, increased muscle spasms, impaired flexibility, improper body mechanics, postural dysfunction, and ***pain. ***scar restrictions   ACTIVITY LIMITATIONS  self-care,  sleep, home chores, work tasks    PARTICIPATION LIMITATIONS:  community, gym activities    PERSONAL FACTORS   affecting patient's functional outcome:    REHAB POTENTIAL: Good   CLINICAL DECISION MAKING: Evolving/moderate complexity   EVALUATION COMPLEXITY: Moderate    PATIENT EDUCATION:    Education details: Showed pt anatomy images. Explained muscles attachments/ connection, physiology of deep core system/ spinal- thoracic-pelvis-lower kinetic chain as they relate to pt's presentation, Sx, and past Hx. Explained what and how these areas of deficits need to be restored to balance and function    See Therapeutic activity / neuromuscular re-education section  Answered pt's questions.  Person educated: Patient Education method: Explanation, Demonstration, Tactile cues, Verbal cues, and Handouts Education comprehension: verbalized understanding, returned demonstration, verbal cues required, tactile cues required, and needs further education     PLAN: PT FREQUENCY: 1x/week   PT DURATION: 10 weeks   PLANNED INTERVENTIONS:   Gait training;Stair training;Functional mobility training;DME Instruction;Therapeutic  activities;Therapeutic exercise;Balance training;Neuromuscular re-education;Patient/family education;Vestibular;Visual/perceptual remediation/compensation;Passive range of motion;Moist Heat;Cryotherapy;Traction;Canalith Repostioning;Joint Manipulations;Manual lymph drainage;Manual techniques;Scar mobilization;Energy conservation;Dry needling;ADLs/Self Care Home Management;Biofeedback;Electrical Stimulation;Taping    PLAN FOR NEXT SESSION: See clinical impression for plan     GOALS: Goals reviewed with patient? Yes  SHORT TERM GOALS: Target date: 04/25/2024    Pt will demo IND with HEP                    Baseline: Not IND            Goal status: INITIAL   LONG TERM GOALS: Target date: 06/06/2024    1.Pt will demo proper deep core coordination without chest breathing and optimal excursion of diaphragm/pelvic floor in order to promote spinal stability and pelvic floor function  Baseline: dyscoordination Goal status: INITIAL  2.  Pt will demo proper body mechanics in against gravity tasks and ADLs  work tasks, fitness  to minimize straining pelvic floor / back    Baseline: not IND, improper form that places strain on pelvic floor  Goal status: INITIAL    3. Pt will demo increased gait speed > 1.3 m/s with reciprocal gait pattern, longer stride length  in order to ambulate safely in community and return to fitness routine  Baseline:  Goal status: INITIAL    4. Pt will demo levelled pelvic girdle and shoulder height in order to progress to deep core strengthening HEP and restore mobility at spine, pelvis, gait, posture minimize falls, and improve balance  Baseline:  Goal status: INITIAL   5. Pt will improve PFDI-7 questionnaire to  pts  score change  to demo improved QOL  Baseline:    ( greater pts indicate greater negative impact on QOL)     pts  ( total)    pts  ( UIQ-7 )    pts  ( CRAIQ-7 )    pts  ( POPIQ-7 )  Baseline:  Goal status: INITIAL  6.  Baseline:- across  70% of the time, she has type 6-7 and 30% of the time, Type 4, Pt reports her medications are causing loose stool and her MD knows that. She is trying to increase her fiber. Pt was told to decrease her water  intake from 10 bottles to 3 bottles. Pt would like to work with a nutritionist.  Majority of her life, she had excellent bowel movements.  Once  month in the past 6 months, pt had fecal leakage and also had to run to get to the bathroom and soiled her pad.   7.  Baseline:    Pia Lupe Plump, PT 03/28/2024, 11:33 AM  "

## 2024-03-29 ENCOUNTER — Other Ambulatory Visit: Payer: Self-pay

## 2024-03-29 ENCOUNTER — Other Ambulatory Visit: Payer: Self-pay | Admitting: Nurse Practitioner

## 2024-03-29 ENCOUNTER — Telehealth: Payer: Self-pay | Admitting: Internal Medicine

## 2024-03-29 DIAGNOSIS — R928 Other abnormal and inconclusive findings on diagnostic imaging of breast: Secondary | ICD-10-CM

## 2024-03-29 NOTE — Telephone Encounter (Signed)
 Routing to PCP for review.

## 2024-03-29 NOTE — Telephone Encounter (Signed)
 Copied from CRM [id number]. Topic: Clinical - Medication Question >> Mar 29, 2024  1:15 PM Joesph B wrote: Reason for CRM: patient is having a biopsy Thursday and would like her pcp to send in a medication to keep her calm. Requesting to have it sent by tomorrow.   [id number] - [place], [place] - [address] [place] [address] Phone: [phone] Fax: [phone] Hours: Not open 24 hours

## 2024-03-29 NOTE — Telephone Encounter (Signed)
 Copied from CRM [id number]. Topic: General - Call Back - No Documentation >> Mar 29, 2024  8:01 AM   [name] wrote:  Reason for CRM: pt has a mammogram, provider concerned about one area on pt and pt wanted provider to know about scheduling a biopsy Cb: [phone]    ----------------------------------------------------------------------- From previous Reason for Contact - Other: Reason for CRM: pt has a mammogram, provider concerned about one area on pt and pt wanted provider to know about scheduling a biopsy Cb: [phone]

## 2024-03-30 ENCOUNTER — Encounter: Payer: Self-pay | Admitting: Pulmonary Disease

## 2024-03-30 ENCOUNTER — Telehealth: Payer: Self-pay | Admitting: Internal Medicine

## 2024-03-30 ENCOUNTER — Ambulatory Visit: Admitting: Pulmonary Disease

## 2024-03-30 ENCOUNTER — Other Ambulatory Visit: Payer: Self-pay | Admitting: Nurse Practitioner

## 2024-03-30 VITALS — BP 104/64 | HR 108 | Temp 98.1°F | Ht 65.5 in | Wt 111.6 lb

## 2024-03-30 DIAGNOSIS — J439 Emphysema, unspecified: Secondary | ICD-10-CM

## 2024-03-30 DIAGNOSIS — M069 Rheumatoid arthritis, unspecified: Secondary | ICD-10-CM | POA: Diagnosis not present

## 2024-03-30 DIAGNOSIS — Z87891 Personal history of nicotine dependence: Secondary | ICD-10-CM

## 2024-03-30 DIAGNOSIS — R928 Other abnormal and inconclusive findings on diagnostic imaging of breast: Secondary | ICD-10-CM

## 2024-03-30 DIAGNOSIS — J449 Chronic obstructive pulmonary disease, unspecified: Secondary | ICD-10-CM | POA: Diagnosis not present

## 2024-03-30 MED ORDER — ALPRAZOLAM 0.5 MG PO TABS: ORAL_TABLET | ORAL | 0 refills | Status: DC

## 2024-03-30 NOTE — Progress Notes (Signed)
 "  Synopsis: Referred in by Vicci Barnie NOVAK, MD   Subjective:   PATIENT ID: [name] GENDER: female DOB: 1960-07-17, MRN: [id number]  Chief Complaint  Patient presents with   COPD    SOB and wheezing are better. Cough with clear sputum.  Ohtuvayre - BID, GI problems. Breztri - BID. Albuterol - PRN    HPI Ms. Sauser is a 64 year old female patient with a past medical history of Stage III COPD Gp B mmrc 2 previous patient of Dr. Darlean, rheumatoid arthritis on Enbrel   presenting today to the pumonary clinic to establish care.   She was last seen by Dr. Darlean om 02/2022 at which point she was switched to Breztri . Doing well on Breztri  but remains with significant dyspnea.   PFTs 2023 with Stage III COPD with post bronchodilator FEV-1 32% of predicted. A1AT 186 MM.    She is currently on Breztri  and albuterol  as needed. She completed pulmonary rehab and realized that she was really limited by her breathing and is seekin other treatment modalities one of which is zephyr valve which she is really interested in.   Family history: Maternal Uncle with lung cancer.   Social history:  Ex smoker quit in 2023. Smoked 1 ppd for multiple years.   OV 11/23/2023 - Ms. Losurdo is here to follow up regarding her COPD. She is doing well overall. We started her on Ohtuvayre  during the last visit 03/2023 she feels that it is helping. She is pending her appointment at [facility] for [facility] evaluation.   OV 03/30/2024 - Ms. Guthridge is her to follow up regarding her stage II COPD. She feels that Ohtuvayre  is helping. She was evaluated for BLVR at [facility] but was not deemed a candidate as 6 min walk test was barely under normal. She is still having GI adverse effects from Ohtuvayre  but this has improved with diet modifications and regulaing fiber intake. Finally she recently had a lung cancer screening CT chest which showed interseptal thickening in the right upper lobe. Plan to repeat a ct chest in 6 monhts.   ROS All systems  were reviewed and are negative except for the above.  Objective:   Vitals:   03/30/24 1142  BP: 104/64  Pulse: (!) 108  Temp: 98.1 F (36.7 C)  SpO2: 90%  Weight: 111 lb 9.6 oz (50.6 kg)  Height: 5' 5.5 (1.664 m)    90% on RA BMI Readings from Last 3 Encounters:  03/30/24 18.29 kg/m  02/26/24 18.12 kg/m  01/25/24 18.78 kg/m   Wt Readings from Last 3 Encounters:  03/30/24 111 lb 9.6 oz (50.6 kg)  02/26/24 110 lb 9.6 oz (50.2 kg)  01/25/24 114 lb 9.6 oz (52 kg)    Physical Exam GEN: NAD, Healthy Appearing HEENT: Supple Neck, Reactive Pupils, EOMI  CVS: Normal S1, Normal S2, RRR, No murmurs or ES appreciated  Lungs: Poor air movement bilaterally.  Abdomen: Soft, non tender, non distended, + BS  Extremities: Warm and well perfused, No edema  Skin: No suspicious lesions appreciated  Psych: Normal Affect  Ancillary Information   CBC    Component Value Date/Time   WBC 5.4 01/25/2024 1342   RBC 5.11 (H) 01/25/2024 1342   HGB 15.4 01/25/2024 1342   HGB 14.5 10/24/2020 1210   HGB 12.8 01/15/2018 1509   HCT 47.0 (H) 01/25/2024 1342   HCT 38.3 01/15/2018 1509   PLT 237 01/25/2024 1342   PLT 167 10/24/2020 1210   PLT 311 01/15/2018 1509  MCV 92.0 01/25/2024 1342   MCV 86 01/15/2018 1509   MCH 30.1 01/25/2024 1342   MCHC 32.8 01/25/2024 1342   RDW 12.7 01/25/2024 1342   RDW 14.2 01/15/2018 1509   LYMPHSABS 1,380 10/15/2022 1507   MONOABS 0.5 07/31/2021 1145   EOSABS 140 01/25/2024 1342   BASOSABS 38 01/25/2024 1342   Labs and imaging were reviewed.     Latest Ref Rng & Units 07/28/2023    3:49 PM 09/04/2021    3:42 PM  PFT Results  FVC-Pre L 1.53  1.66   FVC-Predicted Pre % 46  49   FVC-Post L 1.64  1.75   FVC-Predicted Post % 49  51   Pre FEV1/FVC % % 42  46   Post FEV1/FCV % % 43  48   FEV1-Pre L 0.63  0.76   FEV1-Predicted Pre % 24  29   FEV1-Post L 0.70  0.84   DLCO uncorrected ml/min/mmHg 9.75  10.70   DLCO UNC% % 47  51   DLCO corrected  ml/min/mmHg  10.45   DLCO COR %Predicted %  50   DLVA Predicted % 76  72   TLC L 6.30  6.24   TLC % Predicted % 121  119   RV % Predicted % 216  206      Assessment & Plan:  Ms. Jezek is a 64 year old female patient with a past medical history of Stage III COPD Gp B mmrc 2 previous patient of Dr. Darlean, heumatoid arthritis on Enbrel   presenting today to the pumonary clinic to establish care.   #Stage III COPD Gp B Mmrc 2. CAT 18 A1AT 186 MM.  []  Continue with budesonide -Formoterol -Glyco [Breztri ] 2puffs BID.  []  C/w Albuterol  as needed.  []  c/w Ohtuvayre  nebulizer 3mg  BID. (Start 03/2023)  []  Referral to [facility] for eval for Zephyr valve placement. (FEV-1 31%, TLC 119% and RV 209%, CT chest 2024 with extensive biapical emphysema) - deemed not a candidate []  LDCT in 02/2024. --> repeat CT chest in 6 months.   I personally spent a total of 40 minutes in the care of the patient today including preparing to see the patient, getting/reviewing separately obtained history, performing a medically appropriate exam/evaluation, counseling and educating, documenting clinical information in the EHR, independently interpreting results, and communicating results.   Darrin Barn, MD  Pulmonary Critical Care 03/30/2024 4:39 PM     "

## 2024-03-30 NOTE — Telephone Encounter (Signed)
 Call placed to pharmacy and they informed me that patient is OK with paying for the medication and they have it ready for pick up.

## 2024-03-30 NOTE — Telephone Encounter (Signed)
 Thanks for letting me know. Prescription sent to her pharmacy for Xanax  to take prior to her biopsy. It can cause drowsiness so she may need a driver to and from the procedure.  I sent pt Mychart message with the same information.

## 2024-03-30 NOTE — Telephone Encounter (Signed)
 Called & spoke to the patient. Verified name & DOB. Informed that prescription was sent to the pharmacy. Patient expressed verbal understanding.

## 2024-03-30 NOTE — Telephone Encounter (Signed)
 Copied from CRM #[id number]. Topic: Clinical - Prescription Issue >> Mar 30, 2024 11:25 AM Delon DASEN wrote: Reason for CRM: ALPRAZolam  (XANAX ) 0.5 MG tablet- pharmacy states that insurance is blocking the prescription, need this taken care of or will pay cash price- please advise

## 2024-03-30 NOTE — Addendum Note (Signed)
 Addended by: VICCI SOBER B on: 03/30/2024 07:21 AM   Modules accepted: Orders

## 2024-03-30 NOTE — Telephone Encounter (Signed)
 Please advise.

## 2024-03-30 NOTE — Telephone Encounter (Signed)
Patient was seen in the office today.  Nothing further needed.

## 2024-03-31 ENCOUNTER — Ambulatory Visit
Admission: RE | Admit: 2024-03-31 | Discharge: 2024-03-31 | Disposition: A | Discharge status: Home / Self Care | Source: Ambulatory Visit | Attending: Nurse Practitioner | Admitting: Nurse Practitioner

## 2024-03-31 DIAGNOSIS — R928 Other abnormal and inconclusive findings on diagnostic imaging of breast: Secondary | ICD-10-CM

## 2024-03-31 HISTORY — PX: BREAST BIOPSY: SHX20

## 2024-03-31 MED ORDER — LIDOCAINE 1 % OPTIME INJ - NO CHARGE
5.0000 mL | Freq: Once | INTRAMUSCULAR | Status: AC
  Administered 2024-03-31: 5 mL
  Filled 2024-03-31: qty 6

## 2024-03-31 MED ORDER — LIDOCAINE-EPINEPHRINE 1 %-1:100000 IJ SOLN
20.0000 mL | Freq: Once | INTRAMUSCULAR | Status: AC
  Administered 2024-03-31: 20 mL
  Filled 2024-03-31: qty 20

## 2024-04-01 LAB — SURGICAL PATHOLOGY

## 2024-04-04 ENCOUNTER — Telehealth: Payer: Self-pay | Admitting: Internal Medicine

## 2024-04-04 ENCOUNTER — Telehealth: Payer: Self-pay | Admitting: Pharmacist

## 2024-04-04 ENCOUNTER — Ambulatory Visit: Admitting: Physical Therapy

## 2024-04-04 NOTE — Telephone Encounter (Signed)
 PC placed to pt today to go over results of CT scan of lungs. This showed some nodular changes in both lungs RT>LT. Questionably due to inflammation or infection. Radiologist recommends referral to pulmonary or CT surgeon or repeat imaging in 3 mths.  Pt reports she saw her pulmonologist on 03/30/2024 and he went over the images with her. Official radiology report was not back at that time. Dr. Malka recommended repeat imaging in 6 mths. I told pt I will send him a message with the official report.  Pt asked about the cyst seen in the liver. I informed her that it is a simple cyst and per radiology report it is unchanged from previous.  All questions were answered.

## 2024-04-04 NOTE — Telephone Encounter (Signed)
 Received letter from [facility] stating Rasuvo  will nto be covered as of 03/10/2024  Submitted a Prior Authorization renewal request to [facility] MEDICARE for RASUVO  via CoverMyMeds. Will update once we receive a response.  Key: [id number]    Per automated response: This medication or product was previously approved on [id number] from 2024-03-10 to 2025-03-09.  Sherry Pennant, PharmD, MPH, BCPS, CPP Clinical Pharmacist

## 2024-04-06 ENCOUNTER — Other Ambulatory Visit: Payer: Self-pay

## 2024-04-06 ENCOUNTER — Other Ambulatory Visit (HOSPITAL_COMMUNITY): Payer: Self-pay

## 2024-04-07 ENCOUNTER — Other Ambulatory Visit: Payer: Self-pay | Admitting: Pharmacy Technician

## 2024-04-07 ENCOUNTER — Ambulatory Visit: Admitting: Physical Therapy

## 2024-04-07 ENCOUNTER — Other Ambulatory Visit: Payer: Self-pay

## 2024-04-07 ENCOUNTER — Other Ambulatory Visit (HOSPITAL_COMMUNITY): Payer: Self-pay

## 2024-04-07 DIAGNOSIS — R2689 Other abnormalities of gait and mobility: Secondary | ICD-10-CM | POA: Diagnosis not present

## 2024-04-07 DIAGNOSIS — M533 Sacrococcygeal disorders, not elsewhere classified: Secondary | ICD-10-CM

## 2024-04-07 DIAGNOSIS — M5459 Other low back pain: Secondary | ICD-10-CM

## 2024-04-07 DIAGNOSIS — R278 Other lack of coordination: Secondary | ICD-10-CM

## 2024-04-07 DIAGNOSIS — G8929 Other chronic pain: Secondary | ICD-10-CM

## 2024-04-07 NOTE — Progress Notes (Signed)
 Specialty Pharmacy Refill Coordination Note  [name] is a 64 y.o. female contacted today regarding refills of specialty medication(s) Etanercept  (Enbrel  Mini)   Patient requested Delivery   Delivery date: 04/13/24   Verified address: [address] [name]   Medication will be filled on: 04/13/24    04/07/2024 Patient is aware of copay and has card on file.

## 2024-04-07 NOTE — Patient Instructions (Addendum)
 Lengthen Back rib by L  shoulder ( winging)  Lie on R  side , pillow between knees and under head  Pull  L arm overhead over mattress, grab the edge of mattress,pull it upward, drawing elbow away from ears  Breathing 10 reps Brushing arm with 3/4 turn onto pillow behind back  Lying on R  side ,Pillow/ Block between knees  dragging top forearm across ribs below breast rotating 3/4 turn,  rotating  _L_ only this week ,  relax onto the pillow behind the back  and then back to other palm , maintain top palm on body whole top and not lift shoulder Do this side this week       Wait do both sides until we have levelled out your spine and shoulders ___  Stretches :   Neck / shoulder stretches:    Lying on back - small sushi roll towel under neck  _ 6 directions   Chin up, down Rotation like changing lanes when driving Ear to shoulder like puppy dog   10 reps    _angel wings, lower elbows down , keep arms touching bed  10 reps    _______

## 2024-04-11 ENCOUNTER — Ambulatory Visit: Admitting: Physical Therapy

## 2024-04-12 ENCOUNTER — Other Ambulatory Visit: Payer: Self-pay

## 2024-04-15 ENCOUNTER — Other Ambulatory Visit (HOSPITAL_COMMUNITY): Payer: Self-pay

## 2024-04-15 ENCOUNTER — Encounter: Payer: Self-pay | Admitting: Internal Medicine

## 2024-04-15 ENCOUNTER — Inpatient Hospital Stay: Admitting: Internal Medicine

## 2024-04-15 VITALS — BP 105/64 | HR 109 | Temp 97.9°F | Resp 16 | Ht 65.5 in | Wt 109.0 lb

## 2024-04-15 DIAGNOSIS — C50412 Malignant neoplasm of upper-outer quadrant of left female breast: Secondary | ICD-10-CM

## 2024-04-15 MED ORDER — TAMOXIFEN CITRATE 20 MG PO TABS
20.0000 mg | ORAL_TABLET | Freq: Every day | ORAL | 1 refills | Status: AC
  Filled 2024-04-15: qty 90, 90d supply, fill #0

## 2024-04-15 NOTE — Assessment & Plan Note (Addendum)
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  Status postlumpectomy followed by radiation.  No chemotherapy.  On adjuvant-tamoxifen  20 mg qhs [NOV, 01/2021- NOV 2027].  I reviewed with the patient the importance of compliance with tamoxifen .  Will send a new prescription.  # Incidental left axillary lymphadenopathy [on lung cancer screening]; UNABLE-to perform biopsy left axilla lymph node [MAY/ June 12th, 2023]-because of blood vessels. JAN 2026- mamo-7 mm calficcation -s/p biopsy benign findings noted evidence of any malignancy.  Proceed with mammo as planned to check for stability in 6 months from now.  #Lung cancer screening: Right upper lobe lung nodule-13 mm-suspicious [given longstanding history of smoking currently quit ]but need to rule out inflammatory infectious causes/given history of arthritis [followed by Dr. Lucrecia repeat imaging in 6 months/August 2026.  #Rheumatoid arthritis: [Dr.Deweshwer; [place]]-methotrexate /; Biologics  # DISPOSITION: #  follow up in 6 months; MD;  no labs- Dr.B

## 2024-04-15 NOTE — Progress Notes (Signed)
 Mammogram and bx 03/31/24. CT chest 03/24/24.  Pt feels like today has been harder on her breathing. SPO2 90%, she has done her inhaler and nebulizer today.

## 2024-04-15 NOTE — Progress Notes (Signed)
 [facility] CONSULT NOTE  Patient Care Team: Vicci Barnie NOVAK, MD as PCP - General (Internal Medicine) Curvin Deward MOULD, MD as Consulting Physician (General Surgery) Dolphus Reiter, MD as Consulting Physician (Rheumatology) Stacia Arlean BROCKS, LCSW (Inactive) as Social Worker (General Practice) Cheryl Waddell CHRISTELLA DEVONNA as Physician Assistant (Physician Assistant) Rennie Cindy SAUNDERS, MD as Consulting Physician (Oncology)  CHIEF COMPLAINTS/PURPOSE OF CONSULTATION: Breast cancer  #  Oncology History Overview Note  2021- FINAL MICROSCOPIC DIAGNOSIS:   A. BREAST, LEFT, LUMPECTOMY:  - Invasive ductal carcinoma, 1.5 cm.  - Ductal carcinoma in situ with calcifications.  - Invasive carcinoma 0.4 cm from posterior margin.  - DCIS focally involves inferior margin.  - Fibrocystic changes with sclerosing adenosis and calcifications.  - See oncology table.   B. LYMPH NODE, LEFT AXILLARY, SENTINEL, BIOPSY:  - One lymph node with no metastatic carcinoma (0/1).   C. LYMPH NODE, LEFT AXILLARY, SENTINEL, BIOPSY:  - One lymph node with no metastatic carcinoma (0/1).   D. LYMPH NODE, LEFT AXILLARY, SENTINEL, BIOPSY:  - One lymph node with no metastatic carcinoma (0/1).   E. LYMPH NODE, LEFT AXILLARY, SENTINEL, BIOPSY:  - One lymph node with no metastatic carcinoma (0/1).   F. BREAST, LEFT ADDITIONAL MEDIAL MARGIN, EXCISION:  - Fibrocystic changes with sclerosing adenosis and calcifications.  - No malignancy identified.  - Final medial margin clear.   G. BREAST, LEFT ADDITIONAL SUPERIOR MARGIN, EXCISION:  - Fibrocystic changes with sclerosing adenosis and calcifications.  - No malignancy identified.  - Final superior margin clear.   ONCOLOGY TABLE:  INVASIVE CARCINOMA OF THE BREAST:  Resection  Procedure: Left breast lumpectomy, 4 left axillary sentinel lymph nodes  with additional medial margin and additional superior margin biopsies.  Specimen Laterality: Left breast.   Tumor Size: 1.5 cm.  Histologic Type: Ductal.  Histologic Grade:       Glandular (Acinar)/Tubular Differentiation: 1.       Nuclear Pleomorphism: 1.       Mitotic Rate: 1.       Overall Grade: 1  Ductal Carcinoma In Situ: Present.  Margins: Free of invasive carcinoma.       Distance from closest margin (millimeters): 4 mm.       Specify closest margin (required only if <73mm): Posterior.  DCIS Margins: Focally involves inferior margin.       Distance from closest margin (millimeters): 0 mm.       Specify closest margin (required only if <66mm): Inferior.  Regional Lymph Nodes:       Number of Lymph Nodes Examined: 4       Number of Sentinel Nodes Examined (if applicable): 4       Number of Lymph Nodes with Macrometastases (>2 mm): 0       Number of Lymph Nodes with Micrometastases: 0       Number of Lymph Nodes with Isolated Tumor Cells (=0.2 mm or =200  cells): 0  Treatment Effect:  No known presurgical therapy  Breast Biomarker Testing Performed on Previous Biopsy:        Testing Performed on Case Number: [id number].             Estrogen Receptor: 95%, positive, strong staining.             Progesterone Receptor: 2%, positive, strong staining.             HER2: Negative with IHC.  Ki-67: 1%.    [name]  06/13/2019 Cancer Staging   Staging form: Breast, AJCC 8th Edition - Clinical stage from 06/13/2019: Stage 0 (cTis (DCIS), cN0, cM0, ER+, PR+)   06/15/2019 Initial Diagnosis   [name]   Malignant neoplasm of upper-outer quadrant of left breast in female, estrogen receptor positive (HCC)  09/15/2019 Initial Diagnosis   Malignant neoplasm of upper-outer quadrant of left breast in female, estrogen receptor positive (HCC)      HISTORY OF PRESENTING ILLNESS: Alone.  Ambulating independently. [name] 64 y.o.  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  Discussed the use of AI scribe software for clinical note transcription with the patient, who gave verbal consent to proceed.  History of Present Illness   JANALYN HIGBY is a 64 year old female with ER-positive breast cancer, status post lumpectomy and on adjuvant tamoxifen , who presents for routine oncology follow-up.  She recently underwent a breast biopsy for a suspicious lesion identified on mammogram; pathology was benign. She expressed concern regarding the lesion's location and adequacy of sampling, but a follow-up mammogram is scheduled in six months. She is unable to palpate any breast masses and has not experienced new breast symptoms.  She continues adjuvant tamoxifen  therapy, initiated in November 2022. She temporarily missed doses, believing she had completed five years of therapy, but has resumed regular use after clarification.  A recent CT scan identified a 13 mm right upper lobe pulmonary nodule. The patient reports that pulmonology and radiology discussed the possibility of inflammation and a connection to rheumatoid arthritis. She has a significant smoking history and underlying COPD. She started a new inhaled therapy for COPD, which has caused gastrointestinal irritation but no new respiratory symptoms.  Her rheumatoid arthritis remains stable, with improvement in symptoms through increased activity and weekly pelvic rehabilitation therapy. She finds the therapy beneficial, particularly for her neck, shoulders, and spine. She maintains a positive outlook and reports no other new or concerning symptoms.       Review of Systems  Constitutional:  Positive for malaise/fatigue. Negative for chills, diaphoresis, fever and weight loss.  HENT:  Negative for nosebleeds and sore throat.   Eyes:  Negative for double vision.  Respiratory:  Negative  for cough, hemoptysis, sputum production, shortness of breath and wheezing.   Cardiovascular:  Negative for chest pain, palpitations, orthopnea and leg swelling.  Gastrointestinal:  Negative for abdominal pain, blood in stool, constipation, diarrhea, heartburn, melena, nausea and vomiting.  Genitourinary:  Negative for dysuria, frequency and urgency.  Musculoskeletal:  Positive for back pain, joint pain and neck pain.  Skin: Negative.  Negative for itching and rash.  Neurological:  Negative for dizziness, tingling, focal weakness, weakness and headaches.  Endo/Heme/Allergies:  Does not bruise/bleed easily.  Psychiatric/Behavioral:  Negative for depression. The patient is nervous/anxious. The patient does not have insomnia.      MEDICAL HISTORY:  Past Medical History:  Diagnosis Date   Anemia    during pregnancy   Anxiety    Asthma    Bronchitis age 45   Cancer (HCC)    left breast ca   COPD (chronic obstructive pulmonary disease) (HCC)    Depression    Hx of bladder infections    been over 5 years since last one  Malignant neoplasm of upper-outer quadrant of left breast in female, estrogen receptor positive (HCC)    Rheumatoid arthritis (HCC)    unc rheum    Tobacco abuse    Trouble in sleeping     SURGICAL HISTORY: Past Surgical History:  Procedure Laterality Date   BREAST BIOPSY Left 03/31/2024   MM LT BREAST BX W LOC DEV 1ST LESION IMAGE BX SPEC STEREO GUIDE 03/31/2024 [facility]   BREAST LUMPECTOMY WITH RADIOACTIVE SEED AND SENTINEL LYMPH NODE BIOPSY Left 08/30/2019   Procedure: LEFT BREAST LUMPECTOMY X 2 WITH RADIOACTIVE SEED AND SENTINEL LYMPH NODE BIOPSY;  Surgeon: Curvin Deward MOULD, MD;  Location: [facility] OR;  Service: General;  Laterality: Left;  PEC BLOCK   COLONOSCOPY WITH PROPOFOL  N/A 03/15/2019   Procedure: COLONOSCOPY WITH PROPOFOL ;  Surgeon: Therisa Bi, MD;  Location: [facility] ENDOSCOPY;  Service: Gastroenterology;  Laterality: N/A;   COLONOSCOPY WITH PROPOFOL  N/A  07/29/2022   Procedure: COLONOSCOPY WITH PROPOFOL ;  Surgeon: Therisa Bi, MD;  Location[facility]COPY;  Service: Gastroenterology;  Laterality: N/A;   HIP ARTHROPLASTY     JOINT REPLACEMENT     MULTIPLE TOOTH EXTRACTIONS     RE-EXCISION OF BREAST LUMPECTOMY Left 10/07/2019   Procedure: RE-EXCISION OF LEFT BREAST INFERIOR MARGIN;  Surgeon: Curvin Deward MOULD, MD;  Location: [facility];  Service: General;  Laterality: Left;   TOTAL HIP ARTHROPLASTY Left 02/15/2018   Procedure: TOTAL HIP ARTHROPLASTY ANTERIOR APPROACH;  Surgeon: Liam Lerner, MD;  Location: [facility] ORS;  Service: Orthopedics;  Laterality: Left;   WISDOM TOOTH EXTRACTION      SOCIAL HISTORY: Social History   Socioeconomic History   Marital status: Single    Spouse name: Not on file   Number of children: 1   Years of education: 14 years   Highest education level: Associate degree: academic program  Occupational History   Not on file  Tobacco Use   Smoking status: Former    Current packs/day: 0.00    Average packs/day: 1 pack/day for 40.0 years (40.0 ttl pk-yrs)    Types: Cigarettes    Start date: 03/10/1981    Quit date: 03/10/2021    Years since quitting: 3.1    Passive exposure: Past   Smokeless tobacco: Never   Tobacco comments:    on nicotine  patches  Vaping Use   Vaping status: Never Used  Substance and Sexual Activity   Alcohol use: Yes    Comment: Occassionally   Drug use: Not Currently    Types: Cocaine    Comment: it's been years ago   Sexual activity: Not Currently    Birth control/protection: Post-menopausal  Other Topics Concern   Not on file  Social History Narrative   Not on file   Social Drivers of Health   Tobacco Use: Medium Risk (04/15/2024)   Patient History    Smoking Tobacco Use: Former    Smokeless Tobacco Use: Never    Passive Exposure: Past  Physicist, Medical Strain: High Risk (02/26/2024)   Overall Financial Resource Strain (CARDIA)    Difficulty of Paying Living  Expenses: Hard  Food Insecurity: Food Insecurity Present (02/26/2024)   Epic    Worried About Programme Researcher, Broadcasting/film/video in the Last Year: Sometimes true    Ran Out of Food in the Last Year: Sometimes true  Transportation Needs: No Transportation Needs (02/26/2024)   Epic    Lack of Transportation (Medical): No    Lack of Transportation (Non-Medical): No  Physical Activity: Insufficiently Active (02/26/2024)  Exercise Vital Sign    Days of Exercise per Week: 3 days    Minutes of Exercise per Session: 40 min  Stress: Stress Concern Present (02/26/2024)   [employer] of Occupational Health - Occupational Stress Questionnaire    Feeling of Stress: To some extent  Social Connections: Moderately Integrated (02/26/2024)   Social Connection and Isolation Panel    Frequency of Communication with Friends and Family: More than three times a week    Frequency of Social Gatherings with Friends and Family: Once a week    Attends Religious Services: More than 4 times per year    Active Member of [employer] or Organizations: Yes    Attends Banker Meetings: More than 4 times per year    Marital Status: Widowed  Intimate Partner Violence: Not At Risk (09/15/2023)   Epic    Fear of Current or Ex-Partner: No    Emotionally Abused: No    Physically Abused: No    Sexually Abused: No  Depression (PHQ2-9): Low Risk (04/15/2024)   Depression (PHQ2-9)    PHQ-2 Score: 0  Recent Concern: Depression (PHQ2-9) - Medium Risk (02/26/2024)   Depression (PHQ2-9)    PHQ-2 Score: 8  Alcohol Screen: Low Risk (02/26/2024)   Alcohol Screen    Last Alcohol Screening Score (AUDIT): 2  Housing: Low Risk (02/26/2024)   Epic    Unable to Pay for Housing in the Last Year: No    Number of Times Moved in the Last Year: 1    Homeless in the Last Year: No  Utilities: Not At Risk (09/15/2023)   Epic    Threatened with loss of utilities: No  Health Literacy: Adequate Health Literacy (09/15/2023)   B1300 Health  Literacy    Frequency of need for help with medical instructions: Never    FAMILY HISTORY: Family History  Problem Relation Age of Onset   Breast cancer Mother 20       metastatic   Cervical cancer Mother    Lung cancer Brother        maternal half brother; d. 87s   Cancer Maternal Grandmother        unknown type; dx 64s   Healthy Son    Lung cancer Maternal Uncle        d. 77s   Lung cancer Maternal Uncle        d. 78   Breast cancer Maternal Great-grandmother        dx unknown age   Bladder Cancer Neg Hx    Rectal cancer Neg Hx    Renal cancer Neg Hx    Uterine cancer Neg Hx     ALLERGIES:  is allergic to other.  MEDICATIONS:  Current Outpatient Medications  Medication Sig Dispense Refill   albuterol  (VENTOLIN  HFA) 108 (90 Base) MCG/ACT inhaler Inhale 2 puffs into the lungs every 6 (six) hours as needed for wheezing or shortness of breath. 6.7 g 6   budesonide -glycopyrrolate -formoterol  (BREZTRI  AEROSPHERE) 160-9-4.8 MCG/ACT AERO inhaler Inhale 2 puffs into the lungs 2 (two) times daily. 10.7 g 11   Calcium Carb-Cholecalciferol  (CALCIUM 1000 + D PO) Take by mouth.     COLLAGEN PO Take by mouth daily.     cyanocobalamin  100 MCG tablet Take 100 mcg by mouth daily.     etanercept  (ENBREL  MINI) 50 MG/ML injection Inject 1 mL (50 mg total) into the skin once a week. 12 mL 0   folic acid  (FOLVITE ) 1 MG tablet Take 2 tablets (  2 mg total) by mouth daily. 180 tablet 3   Methotrexate , PF, (RASUVO ) 20 MG/0.4ML SOAJ Inject 20 mg into the skin once a week. 4.8 mL 0   Multiple Vitamin (MULTIVITAMIN WITH MINERALS) TABS tablet Take 1 tablet by mouth daily.     nicotine  (NICODERM CQ  - DOSED IN MG/24 HR) 7 mg/24hr patch Place 1 patch (7 mg total) onto the skin daily. 28 patch 2   OHTUVAYRE  3 MG/2.5ML SUSP      VITAMIN D  PO Take by mouth daily.     tamoxifen  (NOLVADEX ) 20 MG tablet Take 1 tablet (20 mg total) by mouth daily. 90 tablet 1   No current facility-administered medications for  this visit.    PHYSICAL EXAMINATION:  Vitals:   04/15/24 1516  BP: 105/64  Pulse: (!) 109  Resp: 16  Temp: 97.9 F (36.6 C)  SpO2: 90%   Filed Weights   04/15/24 1516  Weight: 109 lb (49.4 kg)    Physical Exam Vitals and nursing note reviewed.  HENT:     Head: Normocephalic and atraumatic.     Mouth/Throat:     Pharynx: Oropharynx is clear.  Eyes:     Extraocular Movements: Extraocular movements intact.     Pupils: Pupils are equal, round, and reactive to light.  Cardiovascular:     Rate and Rhythm: Normal rate and regular rhythm.  Pulmonary:     Comments: Decreased breath sounds bilaterally.  Abdominal:     Palpations: Abdomen is soft.  Musculoskeletal:        General: Normal range of motion.     Cervical back: Normal range of motion.  Skin:    General: Skin is warm.  Neurological:     General: No focal deficit present.     Mental Status: She is alert and oriented to person, place, and time.  Psychiatric:        Behavior: Behavior normal.        Judgment: Judgment normal.      LABORATORY DATA:  I have reviewed the data as listed Lab Results  Component Value Date   WBC 5.4 01/25/2024   HGB 15.4 01/25/2024   HCT 47.0 (H) 01/25/2024   MCV 92.0 01/25/2024   PLT 237 01/25/2024   Recent Labs    09/02/23 1427 01/25/24 1342  NA 139 141  K 4.5 4.6  CL 102 102  CO2 28 31  GLUCOSE 76 87  BUN 17 15  CREATININE 0.81 0.83  CALCIUM 9.7 9.6  PROT 6.7 6.5  AST 16 23  ALT 15 22  BILITOT 0.4 0.8    RADIOGRAPHIC STUDIES: I have personally reviewed the radiological images as listed and agreed with the findings in the report. CT CHEST LUNG CA SCREEN LOW DOSE W/O CM Result Date: 04/04/2024 CLINICAL DATA:  64 year old female with former smoker with 20 pack-year smoking history, quit smoking 6 months prior EXAM: CT CHEST WITHOUT CONTRAST LOW-DOSE FOR LUNG CANCER SCREENING TECHNIQUE: Multidetector CT imaging of the chest was performed following the standard  protocol without IV contrast. RADIATION DOSE REDUCTION: This exam was performed according to the departmental dose-optimization program which includes automated exposure control, adjustment of the mA and/or kV according to patient size and/or use of iterative reconstruction technique. COMPARISON:  02/10/2023 screening chest CT FINDINGS: Cardiovascular: Normal heart size. No significant pericardial effusion/thickening. Atherosclerotic nonaneurysmal thoracic aorta. Normal caliber pulmonary arteries. Mediastinum/Nodes: No significant thyroid nodules. Unremarkable esophagus. Surgical clips noted in the left axilla. No axillary adenopathy. No pathologically  enlarged mediastinal or discrete hilar nodes on these noncontrast images. Lungs/Pleura: No pneumothorax. No pleural effusion. Severe centrilobular emphysema with diffuse bronchial wall thickening. No acute consolidative airspace disease or lung masses. Previously visualized subpleural 4.7 mm peripheral left upper lobe nodule on image 71 is stable. Widespread new patchy indistinct nodular foci of consolidation throughout the right greater than left lungs, largest 13.0 mm in the right upper lobe on image 112. Upper abdomen: Scattered small simple liver cysts up to 1.3 cm in the peripheral right liver, not appreciably changed. Musculoskeletal: No aggressive appearing focal osseous lesions. Mild thoracic spondylosis. Severe right glenohumeral osteoarthritis. IMPRESSION: 1. Lung-RADS 4B, suspicious. Additional imaging evaluation or consultation with Pulmonology or Thoracic Surgery recommended. Widespread new patchy indistinct nodular foci of consolidation throughout the right greater than left lungs, largest 13.0 mm in the right upper lobe, indeterminate but favor infectious or inflammatory. Follow up low-dose chest CT without contrast in 3 months (please use the following order, CT CHEST LCS NODULE FOLLOW-UP W/O CM) is recommended. 2. Aortic Atherosclerosis  (ICD10-I70.0) and Emphysema (ICD10-J43.9). These results will be called to the ordering clinician or representative by the Radiologist Assistant, and communication documented in the PACS or [employer]. Electronically Signed   By: Selinda DELENA Blue M.D.   On: 04/04/2024 09:46   MM BREAST SURGICAL SPECIMEN NO CHARGE Addendum Date: 04/01/2024 ADDENDUM REPORT: 04/01/2024 14:59 ADDENDUM: PATHOLOGY revealed: Site 1. Breast, left, needle core biopsy, UOQ lumpectomy bed, 7 mm, x clip- FIBROCYSTIC CHANGE WITH DENSE HYALIN FIBROSIS AND CALCIFICATIONS, COMPATIBLE WITH PREVIOUS PROCEDURE-RELATED CHANGES- NEGATIVE FOR CARCINOMA. Pathology results are CONCORDANT with imaging findings, per Dr. Norleen Croak. PATHOLOGY revealed: Site 2. Breast, left, needle core biopsy, UOQ- FIBROCYSTIC CHANGE WITH CALCIFICATIONS- NEGATIVE FOR CARCINOMA. Pathology results are CONCORDANT with imaging findings, per Dr. Norleen Croak. Pathology results and recommendations below were discussed with patient by telephone. Patient reported biopsy site within normal limits with slight tenderness at the site. Post biopsy care instructions were reviewed, questions were answered and my direct phone number was provided to patient. Patient was instructed to call [facility] if any concerns or questions arise related to the biopsy. RECOMMENDATION: Patient to return in 6 months for LEFT breast diagnostic mammogram to ensure stability of biopsied areas. Patient informed a reminder notice will be sent regarding this appointment. Pathology results reported by Mliss CHARM Molt RN 04/01/2024. Electronically Signed   By: Norleen Croak M.D.   On: 04/01/2024 14:59   Result Date: 04/01/2024 CLINICAL DATA:  Indeterminate LEFT breast calcifications at the lumpectomy site EXAM: LEFT BREAST STEREOTACTIC CORE NEEDLE BIOPSY COMPARISON:  Previous exam(s). FINDINGS: The patient and I discussed the procedure of stereotactic-guided biopsy including benefits and  alternatives. We discussed the high likelihood of a successful procedure. We discussed the risks of the procedure including infection, bleeding, tissue injury, clip migration, and inadequate sampling. Informed written consent was given. The usual time out protocol was performed immediately prior to the procedure. Using sterile technique and 1% lidocaine  and 1% lidocaine  with epinephrine  as local anesthetic, under stereotactic guidance, a 9 gauge vacuum assisted device was used to perform core needle biopsy of calcifications in the upper-outer quadrant of the LEFT breast at the lumpectomy site using a LATERAL approach. Specimen radiograph was performed showing representative calcifications. Specimens with calcifications are identified for pathology. Lesion quadrant: Upper-outer At the conclusion of the procedure, X shaped tissue marker clip was deployed into the biopsy cavity. Follow-up 2-view mammogram was performed and dictated separately. IMPRESSION: Stereotactic-guided biopsy of  the LEFT breast calcifications in the upper-outer quadrant lumpectomy site. No apparent complications. Electronically Signed: By: Norleen Croak M.D. On: 03/31/2024 10:00   MM LT BREAST BX W LOC DEV 1ST LESION IMAGE BX SPEC STEREO GUIDE Addendum Date: 04/01/2024 ADDENDUM REPORT: 04/01/2024 14:59 ADDENDUM: PATHOLOGY revealed: Site 1. Breast, left, needle core biopsy, UOQ lumpectomy bed, 7 mm, x clip- FIBROCYSTIC CHANGE WITH DENSE HYALIN FIBROSIS AND CALCIFICATIONS, COMPATIBLE WITH PREVIOUS PROCEDURE-RELATED CHANGES- NEGATIVE FOR CARCINOMA. Pathology results are CONCORDANT with imaging findings, per Dr. Norleen Croak. PATHOLOGY revealed: Site 2. Breast, left, needle core biopsy, UOQ- FIBROCYSTIC CHANGE WITH CALCIFICATIONS- NEGATIVE FOR CARCINOMA. Pathology results are CONCORDANT with imaging findings, per Dr. Norleen Croak. Pathology results and recommendations below were discussed with patient by telephone. Patient reported biopsy site within  normal limits with slight tenderness at the site. Post biopsy care instructions were reviewed, questions were answered and my direct phone number was provided to patient. Patient was instructed to call [facility] if any concerns or questions arise related to the biopsy. RECOMMENDATION: Patient to return in 6 months for LEFT breast diagnostic mammogram to ensure stability of biopsied areas. Patient informed a reminder notice will be sent regarding this appointment. Pathology results reported by Mliss CHARM Molt RN 04/01/2024. Electronically Signed   By: Norleen Croak M.D.   On: 04/01/2024 14:59   Result Date: 04/01/2024 CLINICAL DATA:  Indeterminate LEFT breast calcifications at the lumpectomy site EXAM: LEFT BREAST STEREOTACTIC CORE NEEDLE BIOPSY COMPARISON:  Previous exam(s). FINDINGS: The patient and I discussed the procedure of stereotactic-guided biopsy including benefits and alternatives. We discussed the high likelihood of a successful procedure. We discussed the risks of the procedure including infection, bleeding, tissue injury, clip migration, and inadequate sampling. Informed written consent was given. The usual time out protocol was performed immediately prior to the procedure. Using sterile technique and 1% lidocaine  and 1% lidocaine  with epinephrine  as local anesthetic, under stereotactic guidance, a 9 gauge vacuum assisted device was used to perform core needle biopsy of calcifications in the upper-outer quadrant of the LEFT breast at the lumpectomy site using a LATERAL approach. Specimen radiograph was performed showing representative calcifications. Specimens with calcifications are identified for pathology. Lesion quadrant: Upper-outer At the conclusion of the procedure, X shaped tissue marker clip was deployed into the biopsy cavity. Follow-up 2-view mammogram was performed and dictated separately. IMPRESSION: Stereotactic-guided biopsy of the LEFT breast calcifications in the upper-outer  quadrant lumpectomy site. No apparent complications. Electronically Signed: By: Norleen Croak M.D. On: 03/31/2024 10:00   MM CLIP PLACEMENT LEFT Result Date: 03/31/2024 CLINICAL DATA:  Status post LEFT breast stereotactic biopsy EXAM: 3D DIAGNOSTIC LEFT MAMMOGRAM POST STEREOTACTIC BIOPSY COMPARISON:  Previous exam(s). ACR Breast Density Category c: The breasts are heterogeneously dense, which may obscure small masses. FINDINGS: 3D Mammographic images were obtained following stereotactic guided biopsy of LEFT breast calcifications in the upper-outer quadrant at the lumpectomy site. The biopsy marking clip is displaced by approximately 2 cm LATERAL to the expected biopsy site. IMPRESSION: Approximately 2 cm LATERAL displacement of the X-shaped biopsy marking clip at the site of biopsy in the LEFT breast. Final Assessment: Post Procedure Mammograms for Marker Placement Electronically Signed   By: Norleen Croak M.D.   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 The images were evaluated with computer-aided detection. COMPARISON:  Previous exam(s). ACR Breast Density Category c: The breasts are heterogeneously dense, which may obscure small masses. FINDINGS: Spot magnification views of the LEFT breast demonstrate punctate calcifications along the lumpectomy bed in a linear distribution, best appreciated on exaggerated lateral imaging. These are not definitively stable compared to prior. These are estimated to span approximately 7 mm. No suspicious mass, distortion, or microcalcifications are identified to suggest presence of malignancy in the  RIGHT breast. IMPRESSION: 1. There is an indeterminate 7 mm group of calcifications along the lumpectomy bed. Recommend stereotactic guided biopsy for definitive characterization. 2. No mammographic evidence of malignancy in the RIGHT breast. RECOMMENDATION: LEFT breast stereotactic guided biopsy x1 I have discussed the findings and recommendations with the patient. The biopsy procedure was discussed with the patient and questions were answered. Patient expressed their understanding of the biopsy recommendation. Patient will be scheduled for biopsy at her earliest convenience by the schedulers. Ordering provider will be notified. If applicable, a reminder letter will be sent to the patient regarding the next appointment. BI-RADS CATEGORY  4: Suspicious. Electronically Signed   By: Corean Salter M.D.   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  Status postlumpectomy followed by radiation.  No chemotherapy.  On adjuvant-tamoxifen  20 mg qhs [NOV, 01/2021- NOV 2027].  I reviewed with the patient the importance of compliance with tamoxifen .  Will send a new prescription.  # Incidental left axillary lymphadenopathy [on lung cancer screening]; UNABLE-to perform biopsy left axilla lymph node [MAY/ June 12th, 2023]-because of blood vessels. JAN 2026- mamo-7 mm calficcation -s/p biopsy benign findings noted evidence of any malignancy.  Proceed with mammo as planned to check for stability in 6 months from now.  #Lung cancer screening: Right upper lobe lung nodule-13 mm-suspicious [given longstanding history of smoking currently quit ]but need to rule out inflammatory infectious causes/given history of arthritis [followed by Dr. Lucrecia repeat imaging in 6 months/August 2026.  #Rheumatoid arthritis: [Dr.Deweshwer; [place]]-methotrexate /;  Biologics  # DISPOSITION: #  follow up in 6 months; MD;  no labs- Dr.B   All questions were answered. The patient/family knows to call the clinic with any problems, questions or concerns.     Cindy JONELLE Joe, MD 04/15/2024 4:04 PM

## 2024-04-18 ENCOUNTER — Ambulatory Visit: Admitting: Physical Therapy

## 2024-04-19 ENCOUNTER — Ambulatory Visit: Admitting: Obstetrics

## 2024-04-25 ENCOUNTER — Ambulatory Visit: Admitting: Physical Therapy

## 2024-05-02 ENCOUNTER — Ambulatory Visit: Admitting: Physical Therapy

## 2024-05-09 ENCOUNTER — Ambulatory Visit: Admitting: Physical Therapy

## 2024-05-10 ENCOUNTER — Ambulatory Visit: Admitting: Physical Therapy

## 2024-05-12 ENCOUNTER — Ambulatory Visit: Admitting: Dermatology

## 2024-05-16 ENCOUNTER — Ambulatory Visit: Admitting: Physical Therapy

## 2024-05-23 ENCOUNTER — Ambulatory Visit: Admitting: Physical Therapy

## 2024-05-30 ENCOUNTER — Ambulatory Visit: Admitting: Physical Therapy

## 2024-06-06 ENCOUNTER — Ambulatory Visit: Admitting: Physical Therapy

## 2024-06-27 ENCOUNTER — Ambulatory Visit: Admitting: Physician Assistant

## 2024-06-27 ENCOUNTER — Ambulatory Visit: Payer: Self-pay | Admitting: Internal Medicine

## 2024-09-20 ENCOUNTER — Ambulatory Visit

## 2024-10-14 ENCOUNTER — Inpatient Hospital Stay: Admitting: Internal Medicine
